# Patient Record
Sex: Male | Born: 1945 | Race: White | Hispanic: No | Smoking: Former smoker
Health system: Southern US, Community
[De-identification: ages and names within clinical notes are randomized; demographics above are authoritative.]

## PROBLEM LIST (undated history)

## (undated) DIAGNOSIS — I739 Peripheral vascular disease, unspecified: Secondary | ICD-10-CM

## (undated) DIAGNOSIS — I10 Essential (primary) hypertension: Secondary | ICD-10-CM

## (undated) DIAGNOSIS — E119 Type 2 diabetes mellitus without complications: Secondary | ICD-10-CM

## (undated) DIAGNOSIS — I499 Cardiac arrhythmia, unspecified: Secondary | ICD-10-CM

## (undated) HISTORY — PX: OTHER SURGICAL HISTORY: SHX169

## (undated) HISTORY — DX: Peripheral vascular disease, unspecified: I73.9

---

## 2001-07-12 ENCOUNTER — Encounter: Payer: Self-pay | Admitting: Family Medicine

## 2001-07-12 ENCOUNTER — Encounter: Admission: RE | Admit: 2001-07-12 | Discharge: 2001-07-12 | Payer: Self-pay | Admitting: Family Medicine

## 2003-10-21 ENCOUNTER — Encounter: Admission: RE | Admit: 2003-10-21 | Discharge: 2003-10-21 | Payer: Self-pay | Admitting: Family Medicine

## 2005-10-04 ENCOUNTER — Encounter: Admission: RE | Admit: 2005-10-04 | Discharge: 2005-10-04 | Payer: Self-pay | Admitting: Family Medicine

## 2006-03-20 ENCOUNTER — Emergency Department (HOSPITAL_COMMUNITY): Admission: EM | Admit: 2006-03-20 | Discharge: 2006-03-20 | Payer: Self-pay | Admitting: Emergency Medicine

## 2006-04-20 ENCOUNTER — Ambulatory Visit (HOSPITAL_BASED_OUTPATIENT_CLINIC_OR_DEPARTMENT_OTHER): Admission: RE | Admit: 2006-04-20 | Discharge: 2006-04-20 | Payer: Self-pay | Admitting: *Deleted

## 2008-12-23 ENCOUNTER — Encounter: Admission: RE | Admit: 2008-12-23 | Discharge: 2008-12-23 | Payer: Self-pay | Admitting: Family Medicine

## 2010-08-28 NOTE — Op Note (Signed)
NAME:  Randy Jacobson, Randy Jacobson NO.:  [id number]   MEDICAL RECORD NO.:  [id number]          PATIENT TYPE:  AMB   LOCATION:  DSC                          FACILITY:  [facility]   PHYSICIAN:  Tennis Must Meyerdierks, M.D.DATE OF BIRTH:  Aug 30, 1945   DATE OF PROCEDURE:  04/20/2006  DATE OF DISCHARGE:                               OPERATIVE REPORT   PREOPERATIVE DIAGNOSIS:  Necrosis, right long fingertip, status post  crush injury.   POSTOPERATIVE DIAGNOSIS:  Necrosis, right long fingertip, status post  crush injury.   PROCEDURE:  Debridement of nonviable tissue with excision of comminuted  portion of the distal phalanx, right long finger, and closure of wound.   SURGEON:  Dr. Metro Kung.   ANESTHESIA:  Half-percent Marcaine local in the minor room.   OPERATIVE FINDINGS:  The patient had a previous crush injury and  ___________ of the pulp became necrotic radially.  The distal phalanx  was severely comminuted.  The nail bed had a circular defect in the mid  portion.   PROCEDURE:  Under half-percent Marcaine local anesthesia, the right hand  was prepped and draped in the minor room, and the necrotic tissue was  debrided sharply.  The nail plate was removed, and the nail bed was  found to have a circular defect.  The flap was elevated after debriding  all nonviable tissue, and rongeur was used to remove the distal portion  of the distal phalanx that had been fractured.  After debriding all  nonviable tissue, the wound was copiously irrigated.  The nail bed was  repaired with 5-0 chromic and the skin with a 4-0 nylon.  Sterile  dressings were applied.  The patient tolerated the procedure well and  was discharged in good condition.      Lowell Bouton, M.D.  Electronically Signed     EMM/MEDQ  D:  04/20/2006  T:  04/20/2006  Job:  [id number]

## 2014-05-23 DIAGNOSIS — E1165 Type 2 diabetes mellitus with hyperglycemia: Secondary | ICD-10-CM | POA: Diagnosis not present

## 2014-05-23 DIAGNOSIS — I10 Essential (primary) hypertension: Secondary | ICD-10-CM | POA: Diagnosis not present

## 2014-10-24 DIAGNOSIS — E780 Pure hypercholesterolemia: Secondary | ICD-10-CM | POA: Diagnosis not present

## 2014-10-24 DIAGNOSIS — Z125 Encounter for screening for malignant neoplasm of prostate: Secondary | ICD-10-CM | POA: Diagnosis not present

## 2014-10-24 DIAGNOSIS — D559 Anemia due to enzyme disorder, unspecified: Secondary | ICD-10-CM | POA: Diagnosis not present

## 2014-10-24 DIAGNOSIS — E118 Type 2 diabetes mellitus with unspecified complications: Secondary | ICD-10-CM | POA: Diagnosis not present

## 2014-10-25 ENCOUNTER — Ambulatory Visit
Admission: RE | Admit: 2014-10-25 | Discharge: 2014-10-25 | Disposition: A | Discharge status: Home / Self Care | Payer: Medicare Other | Source: Ambulatory Visit | Attending: Internal Medicine | Admitting: Internal Medicine

## 2014-10-25 ENCOUNTER — Other Ambulatory Visit: Payer: Self-pay | Admitting: Internal Medicine

## 2014-10-25 DIAGNOSIS — M79661 Pain in right lower leg: Secondary | ICD-10-CM

## 2014-10-29 ENCOUNTER — Other Ambulatory Visit (HOSPITAL_COMMUNITY): Payer: Self-pay | Admitting: Internal Medicine

## 2014-10-29 DIAGNOSIS — R52 Pain, unspecified: Secondary | ICD-10-CM

## 2014-10-31 ENCOUNTER — Ambulatory Visit (HOSPITAL_COMMUNITY)
Admission: RE | Admit: 2014-10-31 | Discharge: 2014-10-31 | Disposition: A | Discharge status: Home / Self Care | Payer: Medicare Other | Source: Ambulatory Visit | Attending: Internal Medicine | Admitting: Internal Medicine

## 2014-10-31 ENCOUNTER — Other Ambulatory Visit (HOSPITAL_COMMUNITY): Payer: Self-pay | Admitting: Internal Medicine

## 2014-10-31 DIAGNOSIS — I70213 Atherosclerosis of native arteries of extremities with intermittent claudication, bilateral legs: Secondary | ICD-10-CM | POA: Diagnosis not present

## 2014-10-31 DIAGNOSIS — R52 Pain, unspecified: Secondary | ICD-10-CM | POA: Diagnosis not present

## 2014-10-31 DIAGNOSIS — I70211 Atherosclerosis of native arteries of extremities with intermittent claudication, right leg: Secondary | ICD-10-CM

## 2014-10-31 NOTE — Progress Notes (Signed)
*  PRELIMINARY RESULTS* Vascular Ultrasound Right Lower Extremity Arterial Duplex has been completed.   The right femoral artery in the mid to distal segment exhibits elevated velocities suggestive of 75-99% stenosis. The segment just proximal to the stenosis demonstrates a peak systolic velocity of 97cm/s, which makes the velocity ratio 7.78. This is also consistent with a 75-99% stenosis.  Preliminary results discussed with Dr. Chilton Si.  10/31/2014 12:08 PM Gertie Fey, RVT, RDCS, RDMS

## 2014-11-04 ENCOUNTER — Encounter (HOSPITAL_COMMUNITY): Payer: Self-pay

## 2014-11-04 ENCOUNTER — Ambulatory Visit (HOSPITAL_COMMUNITY)
Admission: RE | Admit: 2014-11-04 | Discharge: 2014-11-04 | Disposition: A | Discharge status: Home / Self Care | Payer: Medicare Other | Source: Ambulatory Visit | Attending: Internal Medicine | Admitting: Internal Medicine

## 2014-11-04 ENCOUNTER — Other Ambulatory Visit (HOSPITAL_COMMUNITY): Payer: Self-pay | Admitting: Internal Medicine

## 2014-11-04 DIAGNOSIS — I771 Stricture of artery: Secondary | ICD-10-CM | POA: Diagnosis not present

## 2014-11-04 DIAGNOSIS — I700 Atherosclerosis of aorta: Secondary | ICD-10-CM | POA: Insufficient documentation

## 2014-11-04 DIAGNOSIS — M5136 Other intervertebral disc degeneration, lumbar region: Secondary | ICD-10-CM | POA: Insufficient documentation

## 2014-11-04 DIAGNOSIS — M7121 Synovial cyst of popliteal space [Baker], right knee: Secondary | ICD-10-CM | POA: Diagnosis not present

## 2014-11-04 DIAGNOSIS — I2510 Atherosclerotic heart disease of native coronary artery without angina pectoris: Secondary | ICD-10-CM | POA: Insufficient documentation

## 2014-11-04 DIAGNOSIS — I739 Peripheral vascular disease, unspecified: Secondary | ICD-10-CM | POA: Diagnosis not present

## 2014-11-04 DIAGNOSIS — I70211 Atherosclerosis of native arteries of extremities with intermittent claudication, right leg: Secondary | ICD-10-CM

## 2014-11-04 DIAGNOSIS — K5730 Diverticulosis of large intestine without perforation or abscess without bleeding: Secondary | ICD-10-CM | POA: Insufficient documentation

## 2014-11-04 DIAGNOSIS — I70221 Atherosclerosis of native arteries of extremities with rest pain, right leg: Secondary | ICD-10-CM | POA: Insufficient documentation

## 2014-11-04 HISTORY — DX: Type 2 diabetes mellitus without complications: E11.9

## 2014-11-04 MED ORDER — IOHEXOL 350 MG/ML SOLN
100.0000 mL | Freq: Once | INTRAVENOUS | Status: AC | PRN
  Administered 2014-11-04: 100 mL via INTRAVENOUS

## 2014-11-07 DIAGNOSIS — I739 Peripheral vascular disease, unspecified: Secondary | ICD-10-CM | POA: Diagnosis not present

## 2014-11-08 ENCOUNTER — Other Ambulatory Visit: Payer: Self-pay | Admitting: Internal Medicine

## 2014-11-08 DIAGNOSIS — I739 Peripheral vascular disease, unspecified: Secondary | ICD-10-CM

## 2014-11-21 ENCOUNTER — Other Ambulatory Visit (HOSPITAL_COMMUNITY): Payer: Self-pay | Admitting: Interventional Radiology

## 2014-11-21 ENCOUNTER — Ambulatory Visit
Admission: RE | Admit: 2014-11-21 | Discharge: 2014-11-21 | Disposition: A | Discharge status: Home / Self Care | Payer: Medicare Other | Source: Ambulatory Visit | Attending: Interventional Radiology | Admitting: Interventional Radiology

## 2014-11-21 ENCOUNTER — Other Ambulatory Visit: Payer: Self-pay | Admitting: Internal Medicine

## 2014-11-21 ENCOUNTER — Ambulatory Visit
Admission: RE | Admit: 2014-11-21 | Discharge: 2014-11-21 | Disposition: A | Discharge status: Home / Self Care | Payer: Medicare Other | Source: Ambulatory Visit | Attending: Internal Medicine | Admitting: Internal Medicine

## 2014-11-21 DIAGNOSIS — I739 Peripheral vascular disease, unspecified: Secondary | ICD-10-CM

## 2014-11-21 DIAGNOSIS — I70212 Atherosclerosis of native arteries of extremities with intermittent claudication, left leg: Secondary | ICD-10-CM | POA: Diagnosis not present

## 2014-11-21 NOTE — Consult Note (Signed)
Chief Complaint: Patient was seen in consultation today for  Chief Complaint  Patient presents with  . Advice Only    PAD     at the request of Green,Edwin  Referring Physician(s): Green,Edwin  History of Present Illness: Randy Jacobson is a 69 y.o. male who presents today for symptoms of right lower extremity claudication. He is an extremely active gentleman often working from just after sunrise until sunset. 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  For the past 6-7 months he is experienced right calf claudication when he walks. This is most notable when he is attempting to mow the grass. He does note that the pain improves when he stops to rest. His calf pain is quite significant and is interfering with his ability to do what he wants to do on a daily basis.  He denies issues with wounds, ulcerations or rest pain in his right lower extremity. He denies symptoms of claudication in the left lower extremity. Currently he denies chest pain, shortness of breath, dyspnea on exertion or dyspnea while sleeping.   Past Medical History  Diagnosis Date  . Diabetes mellitus without complication     No past surgical history on file.  Allergies: Review of patient's allergies indicates no known allergies.  Medications: Prior to Admission medications   Not on File     No family history on file.  Social History   Social History  . Marital Status: Single    Spouse Name: N/A  . Number of Children: N/A  . Years of Education: N/A   Social History Main Topics  . Smoking status: Former Smoker -- 1.00 packs/day    Types: Cigarettes    Start date: 11/20/1957    Quit date: 11/20/1993  . Smokeless tobacco: Never Used  . Alcohol Use: 0.0 oz/week    0 Standard drinks or equivalent per week     Comment: Beer 12 pack/week     . Drug Use: Yes     Comment: Hx of marijuana use     . Sexual Activity: Not on file   Other Topics Concern  . Not on file   Social History Narrative    Review of Systems: A 12 point ROS discussed and pertinent positives are indicated in the HPI above.  All other systems are negative.  Review of Systems  Vital Signs: BP 179/83 mmHg  Pulse 71  Temp(Src) 97.6 F (36.4 C) (Oral)  Resp 14  Ht 5\' 11"  (1.803 m)  Wt 180 lb (81.647 kg)  BMI 25.12 kg/m2  SpO2 98%  Physical Exam  Constitutional: He is oriented to person, place, and time. He appears well-developed and well-nourished. No distress.  HENT:  Head: Normocephalic and atraumatic.  Eyes: No scleral icterus.  Cardiovascular: Normal rate, regular rhythm and normal heart sounds.   Pulses:      Femoral pulses are 2+ on the right side, and 3+ on the left side.      Popliteal pulses are 0 on the right side, and 2+ on the left side.       Dorsalis pedis pulses are 0 on the right side, and 2+ on the left side.       Posterior tibial pulses are 0 on the right side, and 0 on the left side.  No carotid or abdominal bruits detected.   Pulmonary/Chest: Effort normal and breath sounds normal.  Abdominal: Soft. He exhibits no distension and no mass.  There is no tenderness.  Neurological: He is alert and oriented to person, place, and time.  Skin: Skin is warm and dry.  Trophic nail changes.  No ulcers or skin breakdown.   Psychiatric: He has a normal mood and affect. His behavior is normal.  Nursing note and vitals reviewed.   Imaging: Dg Tibia/fibula Right  10/25/2014   CLINICAL DATA:  Midshaft pain for several weeks, no discrete episode of trauma, numbness of the skin over the fibular aspect of the leg.  EXAM: RIGHT TIBIA AND FIBULA - 2 VIEW  COMPARISON:  None.  FINDINGS: The right tibia and fibula are adequately mineralized. There is no lytic or blastic lesion or periosteal reaction. The observed portions of the knee and ankle exhibit minimal degenerative changes. The overlying soft tissues are unremarkable. There is some calcification of the interosseous membrane. No soft tissue gas  collections or foreign bodies are demonstrated.  IMPRESSION: There is no acute bony abnormality of the right tibia or fibula. No definite soft tissue abnormality is observed either.   Electronically Signed   By: David  [place] M.D.   On: 10/25/2014 12:08   Ct Angio Ao+bifem W/cm &/or Wo/cm  11/04/2014   CLINICAL DATA:  69 year old male with right lower extremity pain and suspected peripheral arterial disease  EXAM: CT ANGIOGRAPHY OF ABDOMINAL AORTA WITH ILIOFEMORAL RUNOFF  TECHNIQUE: Multidetector CT imaging of the abdomen, pelvis and lower extremities was performed using the standard protocol during bolus administration of intravenous contrast. Multiplanar CT image reconstructions and MIPs were obtained to evaluate the vascular anatomy.  CONTRAST:  OMNIPAQUE IOHEXOL 350 MG/ML SOLN  COMPARISON:  None.  FINDINGS: VASCULAR  Aorta: Normal caliber abdominal aorta. Peripheral calcified atherosclerotic plaque. No plaque ulceration, aneurysm or dissection.  Celiac: The left gastric artery and an accessory splenic artery share a common origin and arises directly from the aorta. The main celiac artery provides supply to the main splenic artery and common hepatic artery. No significant narrowing or evidence of visceral artery aneurysm.  SMA: Widely patent and unremarkable.  Renals: Single dominant renal arteries bilaterally are widely patent. No changes of fibromuscular dysplasia.  IMA: Perhaps mild narrowing at the origin.  Otherwise, unremarkable.  RIGHT Lower Extremity  Inflow: Focal high-grade stenosis of the proximal right common iliac artery secondary to calcified atherosclerotic plaque. Internal iliac artery is patent. The external iliac artery is relatively spared. Calcified atherosclerotic plaque present along the posterior aspect of the common femoral artery resulting in mild stenosis.  Outflow: Profunda femoral artery is widely patent. The proximal and mid superficial femoral artery are widely patent.  However, beginning in the mid thigh there is a focal mild to moderate stenosis secondary to heterogeneous atherosclerotic plaque. Distal to this, densely calcified atherosclerotic plaque results in at least 3 areas of moderate to high-grade stenosis. The popliteal artery is minimally diseased but otherwise widely patent.  Runoff: The anterior tibial and peroneal arteries are patent to the ankle. The posterior tibial artery occludes in the mid lower leg. The posterior tibial artery reconstitutes via collateral flow from the peroneal.  LEFT lower Extremity  Inflow: Calcified atherosclerotic plaque in the distal common iliac artery results in at least moderate focal stenosis at the bifurcation. The internal iliac artery remains patent. The common iliac artery is a disease but there is no focal stenosis. Mild calcified plaque in the common femoral artery results in perhaps mild narrowing.  Outflow: Profunda femoral artery is widely patent. The superficial femoral artery is diffusely diseased  with several areas of mild to moderate stenosis beginning in the mid thigh. Disease is most significant in the distal third of the thigh. Calcified plaque results in at least moderate narrowing of the above the knee popliteal artery. The remainder the popliteal artery is relatively spared from disease.  Runoff: Calcified plaque noted in the proximal runoff vessels. The posterior tibial artery occludes in the mid calf. The anterior tibial and peroneal arteries remain patent to the ankle.  Veins: No focal venous abnormality.  Review of the MIP images confirms the above findings.  NON-VASCULAR  Lower Chest: Atherosclerotic calcifications noted along the course of the visualized coronary arteries. The heart is within normal limits for size it no pericardial effusion. The lungs are clear. Unremarkable visualized distal thoracic esophagus.  Abdomen: Unremarkable CT appearance of the stomach, duodenum, spleen, adrenal glands and pancreas.  Normal hepatic contour morphology. No discrete hepatic lesion. Gallbladder is unremarkable. No intra or extrahepatic biliary ductal dilatation.  Unremarkable appearance of the bilateral kidneys. No focal solid lesion, hydronephrosis or nephrolithiasis. 2.7 cm low-attenuation cystic lesion in the medial aspect of the interpolar left kidney consistent with a simple cyst.  Colonic diverticular disease without CT evidence of active inflammation. No evidence of obstruction or focal bowel wall thickening. Normal appendix in the right lower quadrant. The terminal ileum is unremarkable. No free fluid or suspicious adenopathy.  Pelvis: Mild prostatomegaly. Unremarkable appearance of the bladder. No suspicious adenopathy or free fluid.  Bones/Soft Tissues: No acute fracture or aggressive appearing lytic or blastic osseous lesion. L5-S1 degenerative disc disease and facet arthropathy. Fluid collection in the medial aspect of the right popliteal fossa consistent with a Baker's cyst.  IMPRESSION: VASCULAR  1. Multilevel right lower extremity peripheral arterial disease including inflow, outflow and runoff disease as detailed above. Of note, there is a significant focal high-grade stenosis of the proximal common iliac artery, multifocal moderate to advanced disease in the mid and distal superficial femoral artery and chronic occlusion of the posterior tibial artery. 2. Multilevel left lower extremity peripheral arterial disease including inflow, outflow and runoff disease as detailed above. Of note, there is a focal moderate stenosis of the distal common iliac artery just proximal to the bifurcation, multifocal mild to moderate disease in the mid and distal superficial femoral artery and chronic occlusion of the posterior tibial artery. 3. 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 4. No evidence of aneurysm, dissection. 5. Multifocal coronary artery calcification.  Please note that although the presence of coronary artery calcium documents the presence of coronary artery disease, the severity of this disease and any potential stenosis cannot be assessed on this non-gated CT examination. Assessment for potential risk factor modification, dietary therapy or pharmacologic therapy may be warranted, if clinically indicated.  NON VASCULAR  1. Colonic diverticular disease without CT evidence of active inflammation. 2. Lower lumbar degenerative disc disease and facet arthropathy. 3. Right-sided Baker's cyst.  Signed,  Sterling Big, MD  [facility]  [facility]   Electronically Signed   By: Malachy Moan M.D.   On: 11/04/2014 16:26   [place] Low Ext Art Bil W/exercise  11/21/2014   CLINICAL DATA:  69 year old male with right lower extremity claudication  EXAM: NONINVASIVE PHYSIOLOGIC VASCULAR STUDY OF BILATERAL LOWER EXTREMITIES WITH AND WITHOUT EXERCISE  TECHNIQUE: Evaluation of both lower extremities were performed at rest, including calculation of ankle-brachial indices, multiple segmental pressure evaluation, segmental Doppler and  segmental pulse volume recording. Ankle brachial indices were also obtained following treadmill exercise.  COMPARISON:  CTA with runoff 11/04/2014  FINDINGS: RESTING  Right ABI: 0.7  Left ABI: 1.1  Right lower extremity: Abnormal segmental pressures. There is a significant pressure drop between the brachial and high thigh cuff indicating a hemodynamically significant inflow (iliac) lesion. Additionally, there is a significant pressure drop-off between the high thigh and low thigh cuff consistent with superficial femoral disease. Pulse volume recordings demonstrate blunting of the waveforms in the low thigh, calf, ankle and digits.  Left lower extremity: Relatively normal segmental pressures and PVRs to the level of the calf.  POST EXERCISE  Right ABI: 0.1  Left ABI: 1.0  IMPRESSION: 1. Moderate  peripheral arterial disease in the right lower extremity at rest with an ankle brachial index is 0.7. Severe underlying peripheral arterial disease revealed following exercise. 2. Noninvasive ultrasound study confirms significant lesions in the inflow (iliac artery) and outflow (superficial femoral artery) territories. 3. Normal resting and post exercise ankle brachial index on the left. Signed,  Sterling Big, MD  [facility]  [facility]   Electronically Signed   By: Malachy Moan M.D.   On: 11/21/2014 11:41    Labs:  CBC: No results for input(s): WBC, HGB, HCT, PLT in the last 8760 hours.  COAGS: No results for input(s): INR, APTT in the last 8760 hours.  BMP: No results for input(s): NA, K, CL, CO2, GLUCOSE, BUN, CALCIUM, CREATININE, GFRNONAA, GFRAA in the last 8760 hours.  Invalid input(s): CMP  LIVER FUNCTION TESTS: No results for input(s): BILITOT, AST, ALT, ALKPHOS, PROT, ALBUMIN in the last 8760 hours.  TUMOR MARKERS: No results for input(s): AFPTM, CEA, CA199, CHROMGRNA in the last 8760 hours.  Assessment and Plan:  Mr. Sorbello is a very pleasant and active 69 year old gentleman with severe right lower extremity peripheral arterial disease and lifestyle limiting claudication (Rutherford class 2/3).   His resting right ankle brachial index is 0.7 consistent with a moderate peripheral arterial disease, however after 2 minutes of exercise his ABI drops to 0.1 consistent with severe disease.   We discussed a trial of conservative therapy with cilostazol, however his symptoms are significantly affecting his activities of daily living. He runs a Metallurgist and farm and needs to be able to walk without limitations. He strongly desires pursuing endovascular intervention.  1.) Schedule for pelvic and right lower extremity arteriogram as well as placement of kissing iliac stents at [facility] for treatment of his  high-grade right common iliac artery stenosis.  He prefers the procedure be done on a Thursday.  2.) I will plan to see him in follow-up in clinic and reassess his symptoms. If his claudication symptoms persist we will then schedule him for management of his femoral popliteal disease. He has multifocal stenoses in the distal SFA secondary to bulky calcified plaque. Optimal treatment in this setting would include either orbital atherectomy followed by drug-eluting balloon angioplasty or management with a Supera stent which shows optimal results in the setting of bulky calcified plaque.  Thank you for this interesting consult.  I greatly enjoyed meeting Randy Jacobson and look forward to participating in their care.  A copy of this report was sent to the requesting provider on this date.  SignedMalachy Moan 11/21/2014, 10:59 AM   I spent a total of  30 Minutes in face to face in clinical consultation, greater than 50% of which was counseling/coordinating  care for claudication and peripheral arterial disease.

## 2014-12-02 ENCOUNTER — Other Ambulatory Visit (HOSPITAL_COMMUNITY): Payer: Self-pay | Admitting: Interventional Radiology

## 2014-12-19 ENCOUNTER — Other Ambulatory Visit (HOSPITAL_COMMUNITY): Payer: Self-pay | Admitting: Interventional Radiology

## 2014-12-19 ENCOUNTER — Telehealth (HOSPITAL_COMMUNITY): Payer: Self-pay | Admitting: Interventional Radiology

## 2014-12-19 DIAGNOSIS — I771 Stricture of artery: Secondary | ICD-10-CM

## 2014-12-19 NOTE — Telephone Encounter (Signed)
Called pt to schedule his procedure with Dr. Archer Asa. Patient asked for me to call him back in 1 hour to schedule this. I will call him back at 10 am today JM

## 2014-12-24 ENCOUNTER — Other Ambulatory Visit: Payer: Self-pay | Admitting: Radiology

## 2014-12-26 ENCOUNTER — Other Ambulatory Visit: Payer: Self-pay | Admitting: Radiology

## 2014-12-27 ENCOUNTER — Ambulatory Visit (HOSPITAL_COMMUNITY)
Admission: RE | Admit: 2014-12-27 | Discharge: 2014-12-27 | Disposition: A | Discharge status: Home / Self Care | Payer: Medicare Other | Source: Ambulatory Visit | Attending: Interventional Radiology | Admitting: Interventional Radiology

## 2014-12-27 ENCOUNTER — Encounter (HOSPITAL_COMMUNITY): Payer: Self-pay

## 2014-12-27 ENCOUNTER — Other Ambulatory Visit (HOSPITAL_COMMUNITY): Payer: Self-pay | Admitting: Interventional Radiology

## 2014-12-27 DIAGNOSIS — R001 Bradycardia, unspecified: Secondary | ICD-10-CM | POA: Insufficient documentation

## 2014-12-27 DIAGNOSIS — I70219 Atherosclerosis of native arteries of extremities with intermittent claudication, unspecified extremity: Secondary | ICD-10-CM | POA: Diagnosis present

## 2014-12-27 DIAGNOSIS — Z7982 Long term (current) use of aspirin: Secondary | ICD-10-CM | POA: Insufficient documentation

## 2014-12-27 DIAGNOSIS — Z79899 Other long term (current) drug therapy: Secondary | ICD-10-CM | POA: Insufficient documentation

## 2014-12-27 DIAGNOSIS — I70211 Atherosclerosis of native arteries of extremities with intermittent claudication, right leg: Secondary | ICD-10-CM | POA: Diagnosis not present

## 2014-12-27 DIAGNOSIS — I70213 Atherosclerosis of native arteries of extremities with intermittent claudication, bilateral legs: Secondary | ICD-10-CM | POA: Insufficient documentation

## 2014-12-27 DIAGNOSIS — I771 Stricture of artery: Secondary | ICD-10-CM

## 2014-12-27 DIAGNOSIS — E119 Type 2 diabetes mellitus without complications: Secondary | ICD-10-CM | POA: Insufficient documentation

## 2014-12-27 DIAGNOSIS — Z87891 Personal history of nicotine dependence: Secondary | ICD-10-CM | POA: Insufficient documentation

## 2014-12-27 DIAGNOSIS — I959 Hypotension, unspecified: Secondary | ICD-10-CM | POA: Insufficient documentation

## 2014-12-27 DIAGNOSIS — E785 Hyperlipidemia, unspecified: Secondary | ICD-10-CM | POA: Diagnosis not present

## 2014-12-27 LAB — CBC
HCT: 35.7 % — ABNORMAL LOW (ref 39.0–52.0)
Hemoglobin: 12.4 g/dL — ABNORMAL LOW (ref 13.0–17.0)
MCH: 32.5 pg (ref 26.0–34.0)
MCHC: 34.7 g/dL (ref 30.0–36.0)
MCV: 93.7 fL (ref 78.0–100.0)
PLATELETS: 156 10*3/uL (ref 150–400)
RBC: 3.81 MIL/uL — ABNORMAL LOW (ref 4.22–5.81)
RDW: 13.6 % (ref 11.5–15.5)
WBC: 4.7 10*3/uL (ref 4.0–10.5)

## 2014-12-27 LAB — BASIC METABOLIC PANEL
Anion gap: 7 (ref 5–15)
BUN: 15 mg/dL (ref 6–20)
CALCIUM: 9 mg/dL (ref 8.9–10.3)
CO2: 30 mmol/L (ref 22–32)
CREATININE: 0.71 mg/dL (ref 0.61–1.24)
Chloride: 98 mmol/L — ABNORMAL LOW (ref 101–111)
GFR calc Af Amer: >60 mL/min (ref >60)
Glucose, Bld: 171 mg/dL — ABNORMAL HIGH (ref 65–99)
Potassium: 4.8 mmol/L (ref 3.5–5.1)
SODIUM: 135 mmol/L (ref 135–145)

## 2014-12-27 LAB — GLUCOSE, CAPILLARY
GLUCOSE-CAPILLARY: 184 mg/dL — AB (ref 65–99)
Glucose-Capillary: 154 mg/dL — ABNORMAL HIGH (ref 65–99)

## 2014-12-27 LAB — PROTIME-INR
INR: 1.1 (ref 0.00–1.49)
PROTHROMBIN TIME: 14.4 s (ref 11.6–15.2)

## 2014-12-27 LAB — APTT: APTT: 30 s (ref 24–37)

## 2014-12-27 MED ORDER — CLOPIDOGREL BISULFATE 300 MG PO TABS
ORAL_TABLET | ORAL | Status: DC
Start: 2014-12-27 — End: 2014-12-28
  Filled 2014-12-27: qty 1

## 2014-12-27 MED ORDER — HEPARIN SODIUM (PORCINE) 1000 UNIT/ML IJ SOLN
5000.0000 [IU] | Freq: Once | INTRAMUSCULAR | Status: AC
  Administered 2014-12-27: 5000 [IU] via INTRAVENOUS

## 2014-12-27 MED ORDER — SODIUM CHLORIDE 0.9 % IV SOLN
Freq: Once | INTRAVENOUS | Status: AC
  Administered 2014-12-27: 11:00:00 via INTRAVENOUS

## 2014-12-27 MED ORDER — IOHEXOL 300 MG/ML  SOLN
150.0000 mL | Freq: Once | INTRAMUSCULAR | Status: DC | PRN
  Administered 2014-12-27: 80 mL via INTRAVENOUS
  Filled 2014-12-27: qty 150

## 2014-12-27 MED ORDER — MIDAZOLAM HCL 2 MG/2ML IJ SOLN
INTRAMUSCULAR | Status: AC
  Filled 2014-12-27: qty 4

## 2014-12-27 MED ORDER — CLOPIDOGREL BISULFATE 75 MG PO TABS
300.0000 mg | ORAL_TABLET | Freq: Every day | ORAL | Status: DC
  Administered 2014-12-27: 300 mg via ORAL

## 2014-12-27 MED ORDER — SODIUM CHLORIDE 0.9 % IV SOLN
INTRAVENOUS | Status: AC
  Administered 2014-12-27: 11:00:00 via INTRAVENOUS

## 2014-12-27 MED ORDER — CLOPIDOGREL BISULFATE 75 MG PO TABS: 75.0000 mg | ORAL_TABLET | Freq: Every day | ORAL | Status: DC

## 2014-12-27 MED ORDER — HEPARIN SODIUM (PORCINE) 1000 UNIT/ML IJ SOLN
INTRAMUSCULAR | Status: AC
  Filled 2014-12-27: qty 1

## 2014-12-27 MED ORDER — MIDAZOLAM HCL 2 MG/2ML IJ SOLN
INTRAMUSCULAR | Status: AC | PRN
Start: 2014-12-27 — End: 2014-12-27
  Administered 2014-12-27 (×2): 1 mg via INTRAVENOUS

## 2014-12-27 MED ORDER — SODIUM CHLORIDE 0.9 % IV SOLN: Freq: Once | INTRAVENOUS | Status: DC

## 2014-12-27 MED ORDER — FENTANYL CITRATE (PF) 100 MCG/2ML IJ SOLN
INTRAMUSCULAR | Status: AC | PRN
  Administered 2014-12-27: 25 ug via INTRAVENOUS
  Administered 2014-12-27: 50 ug via INTRAVENOUS
  Administered 2014-12-27: 25 ug via INTRAVENOUS

## 2014-12-27 MED ORDER — LIDOCAINE HCL 1 % IJ SOLN
INTRAMUSCULAR | Status: AC
  Filled 2014-12-27: qty 20

## 2014-12-27 MED ORDER — SODIUM CHLORIDE 0.9 % IV SOLN: INTRAVENOUS | Status: AC

## 2014-12-27 MED ORDER — FENTANYL CITRATE (PF) 100 MCG/2ML IJ SOLN
INTRAMUSCULAR | Status: AC
  Filled 2014-12-27: qty 4

## 2014-12-27 MED ORDER — HYDROCODONE-ACETAMINOPHEN 5-325 MG PO TABS: 1.0000 | ORAL_TABLET | ORAL | Status: DC | PRN

## 2014-12-27 MED FILL — Medication: Qty: 1 | Status: AC

## 2014-12-27 NOTE — Procedures (Signed)
Interventional Radiology Procedure Note  Procedure: Kissing iliac stents  Access: Bilateral common femoral arteries, 69F sheaths  Complications: None  Estimated Blood Loss: 0  Recommendations:   -Bedrest x 4 hrs - Load with Plavix - DC laetr today - FU in clinic in 4 weeks with repeat ABI  Signed,  Sterling Big, MD

## 2014-12-27 NOTE — Sedation Documentation (Signed)
Pt alert and oriented, states he is feeling much better at this time. Per Dr Archer Asa 500cc NS bolus infusing now, NS @ 150/h until discharge. Bedrest for 3 hours until 3pm, discharge planned at 4pm.

## 2014-12-27 NOTE — Sedation Documentation (Signed)
Patient is resting comfortably. 

## 2014-12-27 NOTE — Sedation Documentation (Signed)
Pt transferred to nurses station. Vitals stable, no complaints of pain. Will continue to monitor.

## 2014-12-27 NOTE — Sedation Documentation (Signed)
Pt denies pain, MD in room, rapid response at bedside

## 2014-12-27 NOTE — H&P (Signed)
History of Present Illness: Randy Jacobson is a 69 y.o. male with severe right lower extremity arterial disease with lifestyle limiting claudication. He complains of RLE calf pain within 1 hour of activity and pain resolves completely with rest. He denies any wounds or ulcers. He denies any active bleeding or upcoming surgeries. He has had an abnormal ABI, CTA runoff 11/04/14 with evidence of peripheral arterial disease including high grade stenosis of right common iliac artery. He has been seen in full consult on 11/21/14 with Dr. Archer Asa and scheduled today for pelvic and right lower extremity arteriogram with possible angioplasty/stenting. He denies any chest pain, shortness of breath or palpitations. He denies any active signs of bleeding or excessive bruising. He denies any recent fever or chills. The patient denies any history of sleep apnea or chronic oxygen use. He has previously tolerated sedation without complications.    Past Medical History  Diagnosis Date  . Diabetes mellitus without complication   Hyperlipidemia   History reviewed. No pertinent past surgical history.  Allergies: Review of patient's allergies indicates no known allergies.  Medications: Prior to Admission medications   Medication Sig Start Date End Date Taking? Authorizing Provider  aspirin EC 81 MG tablet Take 81 mg by mouth daily.   Yes Historical Provider, MD  furosemide (LASIX) 40 MG tablet Take 40 mg by mouth daily.   Yes Historical Provider, MD  hydrochlorothiazide (HYDRODIURIL) 25 MG tablet Take 25 mg by mouth daily.   Yes Historical Provider, MD  KRILL OIL PO Take 1 capsule by mouth daily.   Yes Historical Provider, MD  losartan (COZAAR) 100 MG tablet Take 100 mg by mouth daily.   Yes Historical Provider, MD  metFORMIN (GLUCOPHAGE) 1000 MG tablet Take 1,000 mg by mouth 2 (two) times daily with a meal.   Yes Historical Provider, MD  Multiple Vitamin (MULTIVITAMIN WITH MINERALS) TABS tablet Take 1  tablet by mouth daily.   Yes Historical Provider, MD  simvastatin (ZOCOR) 40 MG tablet Take 40 mg by mouth daily.   Yes Historical Provider, MD     History reviewed. No pertinent family history.  Social History   Social History  . Marital Status: Single    Spouse Name: N/A  . Number of Children: N/A  . Years of Education: N/A   Social History Main Topics  . Smoking status: Former Smoker -- 1.00 packs/day    Types: Cigarettes    Start date: 11/20/1957    Quit date: 11/20/1993  . Smokeless tobacco: Never Used  . Alcohol Use: 0.0 oz/week    0 Standard drinks or equivalent per week     Comment: Beer 12 pack/week     . Drug Use: Yes     Comment: Hx of marijuana use     . Sexual Activity: Not Asked   Other Topics Concern  . None   Social History Narrative    Review of Systems: A 12 point ROS discussed and pertinent positives are indicated in the HPI above.  All other systems are negative.  Review of Systems  Vital Signs: BP 151/78 mmHg  Pulse 73  Temp(Src) 97.6 F (36.4 C) (Oral)  Resp 16  Ht 5\' 11"  (1.803 m)  Wt 179 lb (81.194 kg)  BMI 24.98 kg/m2  SpO2 100%  Physical Exam  Constitutional: He is oriented to person, place, and time. No distress.  HENT:  Head: Normocephalic and atraumatic.  Cardiovascular: Normal rate and regular rhythm.  Exam reveals no gallop and  no friction rub.   No murmur heard. Left DP 1-2+, Right DP/PT unable to be palpated, loss of hair growth on right shin, warm to touch b/l, no wounds or ulcers seen  Pulmonary/Chest: Effort normal and breath sounds normal. No respiratory distress. He has no wheezes. He has no rales.  Abdominal: Soft. Bowel sounds are normal. He exhibits no distension. There is no tenderness.  Neurological: He is alert and oriented to person, place, and time.  Skin: Skin is warm and dry. He is not diaphoretic.    Mallampati Score:  MD Evaluation Airway: WNL Heart: WNL Abdomen: WNL Chest/ Lungs: WNL ASA   Classification: 2 Mallampati/Airway Score: Two  Imaging: No results found.  Labs:  CBC:  Recent Labs  12/27/14 0730  WBC 4.7  HGB 12.4*  HCT 35.7*  PLT 156    COAGS:  Recent Labs  12/27/14 0730  INR 1.10  APTT 30    BMP:  Recent Labs  12/27/14 0730  NA 135  K 4.8  CL 98*  CO2 30  GLUCOSE 171*  BUN 15  CALCIUM 9.0  CREATININE 0.71  GFRNONAA >60  GFRAA >60    Assessment and Plan: Severe right lower extremity arterial disease with lifestyle limiting claudication Abnormal ABI CTA runoff 11/04/14 with evidence of peripheral arterial disease including high grade stenosis of right common iliac artery Seen in full consult on 11/21/14 with Dr. Archer Asa Scheduled today for pelvic and right lower extremity arteriogram with possible angioplasty/stenting with sedation The patient has been NPO, no blood thinners taken, labs and vitals have been reviewed. Risks and Benefits discussed with the patient including, but not limited to bleeding, infection, vascular injury or contrast induced renal failure. All of the patient's questions were answered, patient is agreeable to proceed. Consent signed and in chart. DM on metformin  HLP on statin   Signed: Berneta Levins 12/27/2014, 8:12 AM

## 2014-12-27 NOTE — Sedation Documentation (Signed)
Vital signs stable. 

## 2014-12-27 NOTE — Discharge Instructions (Signed)
Start plavix 75 mg daily Hold metformin 48 hrs after   Angiogram, Care After Refer to this sheet in the next few weeks. These instructions provide you with information on caring for yourself after your procedure. Your health care provider may also give you more specific instructions. Your treatment has been planned according to current medical practices, but problems sometimes occur. Call your health care provider if you have any problems or questions after your procedure.  WHAT TO EXPECT AFTER THE PROCEDURE After your procedure, it is typical to have the following sensations:  Minor discomfort or tenderness and a small bump at the catheter insertion site. The bump should usually decrease in size and tenderness within 1 to 2 weeks.  Any bruising will usually fade within 2 to 4 weeks. HOME CARE INSTRUCTIONS   You may need to keep taking blood thinners if they were prescribed for you. Take medicines only as directed by your health care provider.  Do not apply powder or lotion to the site.  Do not take baths, swim, or use a hot tub until your health care provider approves.  You may shower 24 hours after the procedure. Remove the bandage (dressing) and gently wash the site with plain soap and water. Gently pat the site dry.  Inspect the site at least twice daily.  Limit your activity for the first 48 hours. Do not bend, squat, or lift anything over 20 lb (9 kg) or as directed by your health care provider.  Plan to have someone take you home after the procedure. Follow instructions about when you can drive or return to work. SEEK MEDICAL CARE IF:  You get light-headed when standing up.  You have drainage (other than a small amount of blood on the dressing).  You have chills.  You have a fever.  You have redness, warmth, swelling, or pain at the insertion site. SEEK IMMEDIATE MEDICAL CARE IF:   You develop chest pain or shortness of breath, feel faint, or pass out.  You have  bleeding, swelling larger than a walnut, or drainage from the catheter insertion site.  You develop pain, discoloration, coldness, or severe bruising in the leg or arm that held the catheter.  You develop bleeding from any other place, such as the bowels. You may see bright red blood in your urine or stools, or your stools may appear black and tarry.  You have heavy bleeding from the site. If this happens, hold pressure on the site. CALL 911 MAKE SURE YOU:  Understand these instructions.  Will watch your condition.  Will get help right away if you are not doing well or get worse. Document Released: 10/15/2004 Document Revised: 08/13/2013 Document Reviewed: 08/21/2012 [facility] Patient Information 2015 [place], [place]. This information is not intended to replace advice given to you by your health care provider. Make sure you discuss any questions you have with your health care provider.

## 2014-12-27 NOTE — Sedation Documentation (Signed)
Report called to SS, [facility] vitals stable, left and right femoral sites WNL. Pt denies pain or light headedness.

## 2014-12-27 NOTE — Progress Notes (Signed)
RT responded to Rapid Response page. Upon assessment pt on 5L, responsive, and Spo2 100%. Rapid Response team and MD at bedside.

## 2014-12-27 NOTE — Sedation Documentation (Signed)
Patient is resting comfortably. Vital signs stable. No complaints of pain or feeling light headed

## 2014-12-27 NOTE — Sedation Documentation (Signed)
Pt alert and oriented, MD at bedside holding pressure. Pt states he is feeling well, vital signs stable

## 2014-12-27 NOTE — Sedation Documentation (Signed)
Pt states he feels light headed no pain

## 2014-12-27 NOTE — Sedation Documentation (Signed)
Pt alert and oriented.

## 2014-12-27 NOTE — Sedation Documentation (Signed)
Patient is resting comfortably. Vital signs stable, no complaints of pain or light headedness. Will continue to monitor

## 2014-12-27 NOTE — Progress Notes (Signed)
Just before moving the patient from the procedure table to the stretcher, Mr. Sigmon became light headed, nauseated and diaphoretic.  Vitals were obtained and the pt was found to be hypotensive and bradycardic.   Pt placed in reverse trendelenburg, fluid bolus initiated.  Concerned for a possible vessel rupture with acute bleeding, the pt was emergently prepped and draped.  An omniflush catheter was advanced into the aorta and angiography was performed.   There is no evidence of vascular complication.  No bleeding, dissection or other issue.  There is minimal left groin hematoma.  Pt denies abdominal or pelvic pain, chest pain or shortness of breath.   He responded quickly to conservative therapy with normalization of blood pressure, bradycardia and resolution of diaphoresis.  This appears to have been a profound vasovagal episode.  Signed,  Sterling Big, MD  Pager: [phone] Clinic: [phone]

## 2014-12-27 NOTE — Sedation Documentation (Signed)
Vital signs stable. Pt resting comfortably, sleeping at this time. Wife at bedside. Left and right femoral sights WNL, no drainage.

## 2014-12-27 NOTE — Sedation Documentation (Signed)
Per dr Archer Asa, pt to stay in nurses station for one hour then transfer to short stay

## 2014-12-31 ENCOUNTER — Other Ambulatory Visit (HOSPITAL_COMMUNITY): Payer: Self-pay | Admitting: Interventional Radiology

## 2014-12-31 ENCOUNTER — Telehealth: Payer: Self-pay | Admitting: Radiology

## 2014-12-31 DIAGNOSIS — I739 Peripheral vascular disease, unspecified: Secondary | ICD-10-CM

## 2014-12-31 NOTE — Telephone Encounter (Signed)
Per Dr Archer Asa:   Patient may return to work on 01/01/2015 without restrictions.   For 4-6 wk follow up.  Will call back to schedule.  Henri Fairland, RN 12/31/2014 2:30 PM

## 2015-01-07 ENCOUNTER — Other Ambulatory Visit (HOSPITAL_COMMUNITY): Payer: Self-pay | Admitting: Interventional Radiology

## 2015-01-07 DIAGNOSIS — I739 Peripheral vascular disease, unspecified: Secondary | ICD-10-CM

## 2015-01-16 DIAGNOSIS — Z23 Encounter for immunization: Secondary | ICD-10-CM | POA: Diagnosis not present

## 2015-02-18 ENCOUNTER — Ambulatory Visit
Admission: RE | Admit: 2015-02-18 | Discharge: 2015-02-18 | Disposition: A | Discharge status: Home / Self Care | Payer: Medicare Other | Source: Ambulatory Visit | Attending: Interventional Radiology | Admitting: Interventional Radiology

## 2015-02-18 ENCOUNTER — Other Ambulatory Visit (HOSPITAL_COMMUNITY): Payer: Self-pay | Admitting: Interventional Radiology

## 2015-02-18 DIAGNOSIS — I739 Peripheral vascular disease, unspecified: Secondary | ICD-10-CM | POA: Diagnosis not present

## 2015-02-18 DIAGNOSIS — I70211 Atherosclerosis of native arteries of extremities with intermittent claudication, right leg: Secondary | ICD-10-CM | POA: Diagnosis not present

## 2015-02-18 NOTE — Progress Notes (Signed)
Patient ID: Randy Jacobson, male   DOB: 1946/03/16, 69 y.o.   MRN: [id number]   Referring Physician(s): Nila Nephew  Chief Complaint: The patient is seen in follow up today s/p placement of bilateral kissing common iliac artery stents on 12/27/14  History of present illness: Randy Jacobson is a 69 year-old white male who was previously referred to the interventional radiology service by Dr. 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 He was seen by Dr. Archer Asa in consultation on 11/21/14 and deemed an appropriate candidate for follow-up lower extremity arteriography with possible intervention. On 12/27/14 he underwent successful placement of bilateral kissing common iliac artery stents. The procedure was performed without immediate complications. He presents today for routine follow-up exam. According to the patient his symptoms have improved tremendously since preprocedure. Initially he rated his right lower extremity pain as an 8 out of 10 with exercise. At the time he also experienced paresthesias as well as a throbbing sensation in the right lower extremity with activity. He denied any acute complaints with the left lower extremity. Post bilateral  iliac artery stenting the patient states that there is no significant right lower extremity pain with ambulation or at rest. There is also no significant paresthesias of either extremity. He does however have some mild achiness of the lower extremities after a long day on his feet but this has improved significantly since the above procedure. He is a nonsmoker and is currently taking baby aspirin daily. He has no additional acute complaints at this time. Follow-up ABI today is 0.97 in both lower extremities, previously 0.7 at rest in right lower extremity with drop to 0.1 with exercise in right lower extremity prior to stenting.   Past Medical History  Diagnosis Date    . Diabetes mellitus without complication     No past surgical history on file.  Allergies: Review of patient's allergies indicates no known allergies.  Medications: Prior to Admission medications   Medication Sig Start Date End Date Taking? Authorizing Provider  aspirin EC 81 MG tablet Take 81 mg by mouth daily.   Yes Historical Provider, MD  furosemide (LASIX) 40 MG tablet Take 40 mg by mouth daily.   Yes Historical Provider, MD  hydrochlorothiazide (HYDRODIURIL) 25 MG tablet Take 25 mg by mouth daily.   Yes Historical Provider, MD  KRILL OIL PO Take 1 capsule by mouth daily.   Yes Historical Provider, MD  losartan (COZAAR) 100 MG tablet Take 100 mg by mouth daily.   Yes Historical Provider, MD  metFORMIN (GLUCOPHAGE) 1000 MG tablet Take 1,000 mg by mouth 2 (two) times daily with a meal.   Yes Historical Provider, MD  Multiple Vitamin (MULTIVITAMIN WITH MINERALS) TABS tablet Take 1 tablet by mouth daily.   Yes Historical Provider, MD  simvastatin (ZOCOR) 40 MG tablet Take 40 mg by mouth daily.   Yes Historical Provider, MD     No family history on file.  Social History   Social History  . Marital Status: Single    Spouse Name: N/A  . Number of Children: N/A  . Years of Education: N/A   Social History Main Topics  . Smoking status: Former Smoker -- 1.00 packs/day    Types: Cigarettes    Start date: 11/20/1957    Quit date: 11/20/1993  . Smokeless tobacco: Never Used  . Alcohol Use: 0.0 oz/week    0 Standard drinks or equivalent per week  Comment: Beer 12 pack/week     . Drug Use: Yes     Comment: Hx of marijuana use     . Sexual Activity: Not on file   Other Topics Concern  . Not on file   Social History Narrative     Vital Signs: BP 182/70 mmHg  Pulse 71  Temp(Src) 97.7 F (36.5 C) (Oral)  Resp 14  Ht 5\' 11"  (1.803 m)  Wt 180 lb (81.647 kg)  BMI 25.12 kg/m2  SpO2 100%  Physical Exam patient is awake, alert. Chest is clear to auscultation  bilaterally. Heart with regular rate and rhythm. Puncture sites bilateral common femoral arteries clean, dry, nontender, no hematoma, no ecchymosis. Extremities full range of motion, no edema, distal pulses faint to 1+ on right ,1-2+ on left.  Imaging: [place] Low Ext Art Bil W/exercise  02/18/2015  CLINICAL DATA:  69 year old male with right lower extremity claudication status post placement of bilateral kissing iliac stents. First postoperative evaluation. EXAM: NONINVASIVE PHYSIOLOGIC VASCULAR STUDY OF BILATERAL LOWER EXTREMITIES WITH AND WITHOUT EXERCISE TECHNIQUE: Evaluation of both lower extremities were performed at rest, including calculation of ankle-brachial indices, multiple segmental pressure evaluation, segmental Doppler and segmental pulse volume recording. Ankle brachial indices were also obtained following treadmill exercise. COMPARISON:  Prior noninvasive vascular study 11/21/2014 FINDINGS: RESTING Right ABI: 1.0 (previously 0.7) Left ABI: 1.1 Right lower extremity: Normal triphasic waveforms in the femoral and popliteal arteries. The dorsalis pedis waveform is also triphasic. The posterior tibial waveform is monophasic consistent with an element of small vessel disease. Otherwise, no significant pressure differential on segmental pressures. Decreased pulse volume recordings at the ankle and digits consistent with small vessels disease. Left lower extremity: Normal triphasic waveforms in the femoral, popliteal and dorsalis pedis arteries. The posterior tibial artery is monophasic. Normal segmental pressures and PVRs to the level of the calf. The ankle and digital waveforms are attenuated. POST EXERCISE Right ABI: 1.0 (previously 0.1) Left ABI: 1.0 IMPRESSION: 1. Interval normalization of the resting and post exercise right ankle brachial indices. This represents a significant improvement compared to moderate and severe disease as demonstrated on the prior study from 11/21/2014. 2. Normal left resting  and post exercise ankle brachial indices. 3. Probable single-vessel runoff disease bilaterally in the posterior tibial artery distributions. Signed, Sterling BigHeath K. McCullough, MD [facility] Electronically Signed   By: Malachy MoanHeath  McCullough M.D.   On: 02/18/2015 10:21    Labs:  CBC:  Recent Labs  12/27/14 0730  WBC 4.7  HGB 12.4*  HCT 35.7*  PLT 156    COAGS:  Recent Labs  12/27/14 0730  INR 1.10  APTT 30    BMP:  Recent Labs  12/27/14 0730  NA 135  K 4.8  CL 98*  CO2 30  GLUCOSE 171*  BUN 15  CALCIUM 9.0  CREATININE 0.71  GFRNONAA >60  GFRAA >60    LIVER FUNCTION TESTS: No results for input(s): BILITOT, AST, ALT, ALKPHOS, PROT, ALBUMIN in the last 8760 hours.  Assessment:    Signed: D. Jeananne RamaKevin Doria Fern 02/18/2015, 10:24 AM   Please refer to Dr. Henri MedalMcCullough's attestation of this note for management and plan.

## 2015-03-13 DIAGNOSIS — E1151 Type 2 diabetes mellitus with diabetic peripheral angiopathy without gangrene: Secondary | ICD-10-CM | POA: Diagnosis not present

## 2015-03-13 DIAGNOSIS — I739 Peripheral vascular disease, unspecified: Secondary | ICD-10-CM | POA: Diagnosis not present

## 2015-03-13 DIAGNOSIS — I10 Essential (primary) hypertension: Secondary | ICD-10-CM | POA: Diagnosis not present

## 2015-07-01 ENCOUNTER — Telehealth: Payer: Self-pay | Admitting: Radiology

## 2015-07-01 ENCOUNTER — Other Ambulatory Visit (HOSPITAL_COMMUNITY): Payer: Self-pay | Admitting: Interventional Radiology

## 2015-07-01 DIAGNOSIS — I739 Peripheral vascular disease, unspecified: Secondary | ICD-10-CM

## 2015-07-01 NOTE — Progress Notes (Signed)
   Refill request for Plavix 75 mg daily From [address] [id number] with Dr Archer AsaMcCullough  Refill x 3  [facility] [phone]

## 2015-08-26 ENCOUNTER — Ambulatory Visit
Admission: RE | Admit: 2015-08-26 | Discharge: 2015-08-26 | Disposition: A | Discharge status: Home / Self Care | Payer: Medicare Other | Source: Ambulatory Visit | Attending: Interventional Radiology | Admitting: Interventional Radiology

## 2015-08-26 DIAGNOSIS — I739 Peripheral vascular disease, unspecified: Secondary | ICD-10-CM | POA: Diagnosis not present

## 2015-08-26 DIAGNOSIS — I743 Embolism and thrombosis of arteries of the lower extremities: Secondary | ICD-10-CM | POA: Diagnosis not present

## 2015-08-26 HISTORY — PX: IR GENERIC HISTORICAL: IMG1180011

## 2015-08-26 NOTE — Progress Notes (Signed)
Referring Physician(s): Dr. Nila Nephew  Supervising Physician: Malachy Moan  Patient Status: Outpt  Chief Complaint: PAD  Subjective: 70 yo male with PAD. Underwent successful Bilateral CIA stents last year. Feels good. Able to continue working and activity. He does report some right calf claudication, but basically works through it. He walked in a parade for about a mile, and though he noticed the pain in his calf, he did not have to stop.  He has no rest pain. Still taking ASA and Plavix, though he's not exactly sure. He does not smoke and good control of his BP and cholesterol. He is back today for follow up with US duplex studies.  Allergies: Review of patient's allergies indicates no known allergies.  Medications: Prior to Admission medications   Medication Sig Start Date End Date Taking? Authorizing Provider  aspirin EC 81 MG tablet Take 81 mg by mouth daily.   Yes Historical Provider, MD  furosemide (LASIX) 40 MG tablet Take 40 mg by mouth daily.   Yes Historical Provider, MD  hydrochlorothiazide (HYDRODIURIL) 25 MG tablet Take 25 mg by mouth daily.   Yes Historical Provider, MD  KRILL OIL PO Take 1 capsule by mouth daily.   Yes Historical Provider, MD  losartan (COZAAR) 100 MG tablet Take 100 mg by mouth daily.   Yes Historical Provider, MD  metFORMIN (GLUCOPHAGE) 1000 MG tablet Take 1,000 mg by mouth 2 (two) times daily with a meal.   Yes Historical Provider, MD  Multiple Vitamin (MULTIVITAMIN WITH MINERALS) TABS tablet Take 1 tablet by mouth daily.   Yes Historical Provider, MD  simvastatin (ZOCOR) 40 MG tablet Take 40 mg by mouth daily.   Yes Historical Provider, MD     Vital Signs: BP 147/77 mmHg  Pulse 72  Temp(Src) 97.8 F (36.6 C) (Oral)  Resp 14  Ht  (1.803 m)  Wt 178 lb (80.74 kg)  BMI 24.84 kg/m2  SpO2 98%  Physical Exam NAD Lungs: CTA Heart: Regular Ext: Bilateral femoral pulses intact. Legs warm, brisk cap refill. Pedal pulses  palpable, weaker on right. No ischemic changes or ulcers.   Imaging: [place] Low Ext Art Bil W/exercise  08/26/2015  CLINICAL DATA:  Peripheral vascular disease. Common iliac artery stents. EXAM: NONINVASIVE PHYSIOLOGIC VASCULAR STUDY OF BILATERAL LOWER EXTREMITIES WITH AND WITHOUT EXERCISE TECHNIQUE: Evaluation of both lower extremities were performed at rest, including calculation of ankle-brachial indices, multiple segmental pressure evaluation, segmental Doppler and segmental pulse volume recording. Ankle brachial indices were also obtained following treadmill exercise. COMPARISON:  02/18/2015 FINDINGS: RESTING Right ABI: 0.82 Left ABI: 1.08 Right lower extremity: The right femoral waveform is triphasic. Popliteal and tibial waveforms are monophasic. Pressure gradient from the right thigh to the calf is 40 mm Hg. Left lower extremity: The femoral, popliteal, and dorsalis pedis waveforms are triphasic while the posterior tibial is monophasic. POST EXERCISE Right ABI: 0.74 Left ABI: 1.05 IMPRESSION: In the left lower extremity, the examination is stable. There is no significant arterial occlusive disease. There has been the interval development of occlusive disease in the right femoral or popliteal system. Previously, there were triphasic waveforms at the right ankle and ankle brachial index was greater than 1. Today, waveforms are monophasic with a diminished ankle brachial index that dropped after exercise. Electronically Signed   By: Jolaine Click M.D.   On: 08/26/2015 09:44    Labs:  CBC:  Recent Labs  12/27/14 0730  WBC 4.7  HGB 12.4*  HCT 35.7*  PLT 156    COAGS:  Recent Labs  12/27/14 0730  INR 1.10  APTT 30    BMP:  Recent Labs  12/27/14 0730  NA 135  K 4.8  CL 98*  CO2 30  GLUCOSE 171*  BUN 15  CALCIUM 9.0  CREATININE 0.71  GFRNONAA >60  GFRAA >60    LIVER FUNCTION TESTS: No results for input(s): BILITOT, AST, ALT, ALKPHOS, PROT, ALBUMIN in the last 8760  hours.  Assessment and Plan: PAD s/p bilateral CIA stent angioplasty last year. Progressed (R)SFA disease by duplex, ABI is down some. Pt is symptomatic but this is not interfering in his lifestyle/work. Cont walking regimen, ASA/Plavix and risk factor control. Will continue yearly follow up with US duplex. Can call clinic sooner if sxs worsen as certainly an arteriogram/treatment is an available option.  Electronically Signed: Brayton ElBRUNING, Deanza Upperman 08/26/2015, 9:49 AM   I spent a total of 25 Minutes at the the patient's bedside AND on the patient's hospital floor or unit, greater than 50% of which was counseling/coordinating care for PAD/claudication

## 2015-10-28 DIAGNOSIS — E1165 Type 2 diabetes mellitus with hyperglycemia: Secondary | ICD-10-CM | POA: Diagnosis not present

## 2015-10-28 DIAGNOSIS — D559 Anemia due to enzyme disorder, unspecified: Secondary | ICD-10-CM | POA: Diagnosis not present

## 2015-10-28 DIAGNOSIS — K921 Melena: Secondary | ICD-10-CM | POA: Diagnosis not present

## 2015-10-28 DIAGNOSIS — Z0000 Encounter for general adult medical examination without abnormal findings: Secondary | ICD-10-CM | POA: Diagnosis not present

## 2015-10-28 DIAGNOSIS — E7800 Pure hypercholesterolemia, unspecified: Secondary | ICD-10-CM | POA: Diagnosis not present

## 2015-10-28 DIAGNOSIS — Z125 Encounter for screening for malignant neoplasm of prostate: Secondary | ICD-10-CM | POA: Diagnosis not present

## 2015-10-28 DIAGNOSIS — I10 Essential (primary) hypertension: Secondary | ICD-10-CM | POA: Diagnosis not present

## 2015-10-28 DIAGNOSIS — E109 Type 1 diabetes mellitus without complications: Secondary | ICD-10-CM | POA: Diagnosis not present

## 2015-10-28 DIAGNOSIS — E785 Hyperlipidemia, unspecified: Secondary | ICD-10-CM | POA: Diagnosis not present

## 2015-10-28 DIAGNOSIS — I739 Peripheral vascular disease, unspecified: Secondary | ICD-10-CM | POA: Diagnosis not present

## 2015-11-04 DIAGNOSIS — E1165 Type 2 diabetes mellitus with hyperglycemia: Secondary | ICD-10-CM | POA: Diagnosis not present

## 2015-11-04 DIAGNOSIS — I10 Essential (primary) hypertension: Secondary | ICD-10-CM | POA: Diagnosis not present

## 2015-11-04 DIAGNOSIS — K921 Melena: Secondary | ICD-10-CM | POA: Diagnosis not present

## 2015-11-04 DIAGNOSIS — I739 Peripheral vascular disease, unspecified: Secondary | ICD-10-CM | POA: Diagnosis not present

## 2015-11-20 DIAGNOSIS — I878 Other specified disorders of veins: Secondary | ICD-10-CM | POA: Diagnosis not present

## 2015-11-20 DIAGNOSIS — G479 Sleep disorder, unspecified: Secondary | ICD-10-CM | POA: Diagnosis not present

## 2015-11-20 DIAGNOSIS — I10 Essential (primary) hypertension: Secondary | ICD-10-CM | POA: Diagnosis not present

## 2015-12-16 ENCOUNTER — Observation Stay (HOSPITAL_COMMUNITY)
Admission: EM | Admit: 2015-12-16 | Discharge: 2015-12-17 | Disposition: A | Discharge status: Home / Self Care | Payer: Medicare Other | Attending: Internal Medicine | Admitting: Internal Medicine

## 2015-12-16 ENCOUNTER — Emergency Department (HOSPITAL_COMMUNITY): Payer: Medicare Other

## 2015-12-16 ENCOUNTER — Encounter (HOSPITAL_COMMUNITY): Payer: Self-pay | Admitting: Emergency Medicine

## 2015-12-16 DIAGNOSIS — Z7982 Long term (current) use of aspirin: Secondary | ICD-10-CM | POA: Insufficient documentation

## 2015-12-16 DIAGNOSIS — I739 Peripheral vascular disease, unspecified: Secondary | ICD-10-CM | POA: Diagnosis present

## 2015-12-16 DIAGNOSIS — I10 Essential (primary) hypertension: Secondary | ICD-10-CM

## 2015-12-16 DIAGNOSIS — R739 Hyperglycemia, unspecified: Secondary | ICD-10-CM

## 2015-12-16 DIAGNOSIS — E119 Type 2 diabetes mellitus without complications: Secondary | ICD-10-CM

## 2015-12-16 DIAGNOSIS — E1151 Type 2 diabetes mellitus with diabetic peripheral angiopathy without gangrene: Secondary | ICD-10-CM | POA: Insufficient documentation

## 2015-12-16 DIAGNOSIS — R55 Syncope and collapse: Secondary | ICD-10-CM | POA: Insufficient documentation

## 2015-12-16 DIAGNOSIS — Z7984 Long term (current) use of oral hypoglycemic drugs: Secondary | ICD-10-CM | POA: Diagnosis not present

## 2015-12-16 DIAGNOSIS — Z87891 Personal history of nicotine dependence: Secondary | ICD-10-CM | POA: Diagnosis not present

## 2015-12-16 DIAGNOSIS — A045 Campylobacter enteritis: Secondary | ICD-10-CM | POA: Diagnosis not present

## 2015-12-16 DIAGNOSIS — Z7902 Long term (current) use of antithrombotics/antiplatelets: Secondary | ICD-10-CM | POA: Diagnosis not present

## 2015-12-16 DIAGNOSIS — A419 Sepsis, unspecified organism: Principal | ICD-10-CM

## 2015-12-16 DIAGNOSIS — R197 Diarrhea, unspecified: Secondary | ICD-10-CM | POA: Diagnosis not present

## 2015-12-16 DIAGNOSIS — E785 Hyperlipidemia, unspecified: Secondary | ICD-10-CM | POA: Diagnosis not present

## 2015-12-16 DIAGNOSIS — R05 Cough: Secondary | ICD-10-CM

## 2015-12-16 DIAGNOSIS — R059 Cough, unspecified: Secondary | ICD-10-CM

## 2015-12-16 DIAGNOSIS — R404 Transient alteration of awareness: Secondary | ICD-10-CM | POA: Diagnosis not present

## 2015-12-16 DIAGNOSIS — E1165 Type 2 diabetes mellitus with hyperglycemia: Secondary | ICD-10-CM | POA: Insufficient documentation

## 2015-12-16 DIAGNOSIS — E860 Dehydration: Secondary | ICD-10-CM | POA: Diagnosis not present

## 2015-12-16 DIAGNOSIS — R509 Fever, unspecified: Secondary | ICD-10-CM | POA: Diagnosis not present

## 2015-12-16 DIAGNOSIS — Z79899 Other long term (current) drug therapy: Secondary | ICD-10-CM | POA: Insufficient documentation

## 2015-12-16 DIAGNOSIS — Z95820 Peripheral vascular angioplasty status with implants and grafts: Secondary | ICD-10-CM | POA: Diagnosis not present

## 2015-12-16 HISTORY — DX: Essential (primary) hypertension: I10

## 2015-12-16 LAB — SAVE SMEAR

## 2015-12-16 LAB — URINE MICROSCOPIC-ADD ON: RBC / HPF: NONE SEEN RBC/hpf (ref 0–5)

## 2015-12-16 LAB — COMPREHENSIVE METABOLIC PANEL
ALK PHOS: 68 U/L (ref 38–126)
ALT: 17 U/L (ref 17–63)
ANION GAP: 12 (ref 5–15)
AST: 25 U/L (ref 15–41)
Albumin: 3.8 g/dL (ref 3.5–5.0)
BILIRUBIN TOTAL: 1.6 mg/dL — AB (ref 0.3–1.2)
BUN: 15 mg/dL (ref 6–20)
CALCIUM: 8.7 mg/dL — AB (ref 8.9–10.3)
CO2: 24 mmol/L (ref 22–32)
Chloride: 91 mmol/L — ABNORMAL LOW (ref 101–111)
Creatinine, Ser: 1.06 mg/dL (ref 0.61–1.24)
GFR calc non Af Amer: >60 mL/min (ref >60)
GLUCOSE: 362 mg/dL — AB (ref 65–99)
Potassium: 4.2 mmol/L (ref 3.5–5.1)
Sodium: 127 mmol/L — ABNORMAL LOW (ref 135–145)
TOTAL PROTEIN: 7.2 g/dL (ref 6.5–8.1)

## 2015-12-16 LAB — CBC WITH DIFFERENTIAL/PLATELET
BASOS PCT: 0 %
Basophils Absolute: 0 10*3/uL (ref 0.0–0.1)
EOS ABS: 0 10*3/uL (ref 0.0–0.7)
Eosinophils Relative: 0 %
HCT: 33.7 % — ABNORMAL LOW (ref 39.0–52.0)
Hemoglobin: 11.2 g/dL — ABNORMAL LOW (ref 13.0–17.0)
Lymphocytes Relative: 4 %
Lymphs Abs: 0.5 10*3/uL — ABNORMAL LOW (ref 0.7–4.0)
MCH: 30 pg (ref 26.0–34.0)
MCHC: 33.2 g/dL (ref 30.0–36.0)
MCV: 90.3 fL (ref 78.0–100.0)
Monocytes Absolute: 2.5 10*3/uL — ABNORMAL HIGH (ref 0.1–1.0)
Monocytes Relative: 20 %
NEUTROS PCT: 76 %
Neutro Abs: 9.3 10*3/uL — ABNORMAL HIGH (ref 1.7–7.7)
PLATELETS: 155 10*3/uL (ref 150–400)
RBC: 3.73 MIL/uL — ABNORMAL LOW (ref 4.22–5.81)
RDW: 15 % (ref 11.5–15.5)
WBC: 12.3 10*3/uL — AB (ref 4.0–10.5)

## 2015-12-16 LAB — LACTIC ACID, PLASMA
LACTIC ACID, VENOUS: 2.5 mmol/L — AB (ref 0.5–1.9)
Lactic Acid, Venous: 2.3 mmol/L (ref 0.5–1.9)

## 2015-12-16 LAB — URINALYSIS, ROUTINE W REFLEX MICROSCOPIC
BILIRUBIN URINE: NEGATIVE
HGB URINE DIPSTICK: NEGATIVE
Ketones, ur: 15 mg/dL — AB
Leukocytes, UA: NEGATIVE
Nitrite: NEGATIVE
PH: 5.5 (ref 5.0–8.0)
Protein, ur: NEGATIVE mg/dL
SPECIFIC GRAVITY, URINE: 1.024 (ref 1.005–1.030)

## 2015-12-16 LAB — GASTROINTESTINAL PANEL BY PCR, STOOL (REPLACES STOOL CULTURE)
Adenovirus F40/41: NOT DETECTED
Astrovirus: NOT DETECTED
CRYPTOSPORIDIUM: NOT DETECTED
CYCLOSPORA CAYETANENSIS: NOT DETECTED
Campylobacter species: DETECTED — AB
E. COLI O157: NOT DETECTED
ENTAMOEBA HISTOLYTICA: NOT DETECTED
Enteroaggregative E coli (EAEC): NOT DETECTED
Enteropathogenic E coli (EPEC): DETECTED — AB
Enterotoxigenic E coli (ETEC): NOT DETECTED
Giardia lamblia: NOT DETECTED
Norovirus GI/GII: NOT DETECTED
Plesimonas shigelloides: NOT DETECTED
Rotavirus A: NOT DETECTED
SALMONELLA SPECIES: NOT DETECTED
SAPOVIRUS (I, II, IV, AND V): NOT DETECTED
SHIGELLA/ENTEROINVASIVE E COLI (EIEC): NOT DETECTED
Shiga like toxin producing E coli (STEC): NOT DETECTED
VIBRIO CHOLERAE: NOT DETECTED
VIBRIO SPECIES: NOT DETECTED
YERSINIA ENTEROCOLITICA: NOT DETECTED

## 2015-12-16 LAB — I-STAT TROPONIN, ED: Troponin i, poc: 0 ng/mL (ref 0.00–0.08)

## 2015-12-16 LAB — LIPASE, BLOOD: Lipase: 24 U/L (ref 11–51)

## 2015-12-16 LAB — GLUCOSE, CAPILLARY: Glucose-Capillary: 323 mg/dL — ABNORMAL HIGH (ref 65–99)

## 2015-12-16 LAB — I-STAT CG4 LACTIC ACID, ED: LACTIC ACID, VENOUS: 2.2 mmol/L — AB (ref 0.5–1.9)

## 2015-12-16 LAB — CK: Total CK: 52 U/L (ref 49–397)

## 2015-12-16 MED ORDER — CLOPIDOGREL BISULFATE 75 MG PO TABS
75.0000 mg | ORAL_TABLET | Freq: Every day | ORAL | Status: DC
  Administered 2015-12-17: 75 mg via ORAL
  Filled 2015-12-16: qty 1

## 2015-12-16 MED ORDER — LOSARTAN POTASSIUM 50 MG PO TABS: 100.0000 mg | ORAL_TABLET | Freq: Every day | ORAL | Status: DC

## 2015-12-16 MED ORDER — DEXTROSE 5 % IV SOLN: 1.0000 g | Freq: Once | INTRAVENOUS | Status: DC

## 2015-12-16 MED ORDER — INSULIN ASPART 100 UNIT/ML ~~LOC~~ SOLN
5.0000 [IU] | Freq: Once | SUBCUTANEOUS | Status: AC
Start: 2015-12-16 — End: 2015-12-16
  Administered 2015-12-16: 5 [IU] via SUBCUTANEOUS

## 2015-12-16 MED ORDER — ENOXAPARIN SODIUM 40 MG/0.4ML ~~LOC~~ SOLN: 40.0000 mg | SUBCUTANEOUS | Status: DC

## 2015-12-16 MED ORDER — ACETAMINOPHEN 325 MG PO TABS
650.0000 mg | ORAL_TABLET | Freq: Four times a day (QID) | ORAL | Status: DC | PRN
  Administered 2015-12-16: 650 mg via ORAL
  Filled 2015-12-16: qty 2

## 2015-12-16 MED ORDER — INSULIN ASPART 100 UNIT/ML ~~LOC~~ SOLN
0.0000 [IU] | Freq: Three times a day (TID) | SUBCUTANEOUS | Status: DC
  Administered 2015-12-17: 8 [IU] via SUBCUTANEOUS
  Administered 2015-12-17: 5 [IU] via SUBCUTANEOUS

## 2015-12-16 MED ORDER — SODIUM CHLORIDE 0.9 % IV BOLUS (SEPSIS)
1000.0000 mL | Freq: Once | INTRAVENOUS | Status: AC
  Administered 2015-12-16: 1000 mL via INTRAVENOUS

## 2015-12-16 MED ORDER — ONDANSETRON HCL 4 MG PO TABS: 4.0000 mg | ORAL_TABLET | Freq: Four times a day (QID) | ORAL | Status: DC | PRN

## 2015-12-16 MED ORDER — HYDROCHLOROTHIAZIDE 25 MG PO TABS: 25.0000 mg | ORAL_TABLET | Freq: Every day | ORAL | Status: DC

## 2015-12-16 MED ORDER — DEXTROSE 5 % IV SOLN
1.0000 g | INTRAVENOUS | Status: DC
  Administered 2015-12-16: 1 g via INTRAVENOUS
  Filled 2015-12-16: qty 10

## 2015-12-16 MED ORDER — AZITHROMYCIN 500 MG PO TABS
500.0000 mg | ORAL_TABLET | Freq: Every day | ORAL | Status: DC
  Administered 2015-12-17: 500 mg via ORAL
  Filled 2015-12-16: qty 1

## 2015-12-16 MED ORDER — FUROSEMIDE 40 MG PO TABS: 40.0000 mg | ORAL_TABLET | Freq: Every day | ORAL | Status: DC

## 2015-12-16 MED ORDER — DEXTROSE 5 % IV SOLN: 500.0000 mg | Freq: Once | INTRAVENOUS | Status: DC

## 2015-12-16 MED ORDER — SIMVASTATIN 40 MG PO TABS
40.0000 mg | ORAL_TABLET | Freq: Every day | ORAL | Status: DC
  Administered 2015-12-16: 40 mg via ORAL
  Filled 2015-12-16: qty 1

## 2015-12-16 MED ORDER — ACETAMINOPHEN 650 MG RE SUPP: 650.0000 mg | Freq: Four times a day (QID) | RECTAL | Status: DC | PRN

## 2015-12-16 MED ORDER — ACETAMINOPHEN 325 MG PO TABS
650.0000 mg | ORAL_TABLET | Freq: Once | ORAL | Status: AC
  Administered 2015-12-16: 650 mg via ORAL
  Filled 2015-12-16: qty 2

## 2015-12-16 MED ORDER — DEXTROSE 5 % IV SOLN
500.0000 mg | INTRAVENOUS | Status: DC
  Administered 2015-12-16: 500 mg via INTRAVENOUS
  Filled 2015-12-16: qty 500

## 2015-12-16 MED ORDER — TRAZODONE HCL 50 MG PO TABS
25.0000 mg | ORAL_TABLET | Freq: Every evening | ORAL | Status: DC | PRN
  Administered 2015-12-16: 25 mg via ORAL
  Filled 2015-12-16: qty 1

## 2015-12-16 MED ORDER — ASPIRIN EC 81 MG PO TBEC
81.0000 mg | DELAYED_RELEASE_TABLET | Freq: Every day | ORAL | Status: DC
Start: 2015-12-16 — End: 2015-12-17
  Administered 2015-12-17: 81 mg via ORAL
  Filled 2015-12-16: qty 1

## 2015-12-16 MED ORDER — HYDROCODONE-ACETAMINOPHEN 5-325 MG PO TABS: 1.0000 | ORAL_TABLET | ORAL | Status: DC | PRN

## 2015-12-16 MED ORDER — ONDANSETRON HCL 4 MG/2ML IJ SOLN: 4.0000 mg | Freq: Four times a day (QID) | INTRAMUSCULAR | Status: DC | PRN

## 2015-12-16 MED ORDER — SODIUM CHLORIDE 0.9 % IV SOLN
Freq: Once | INTRAVENOUS | Status: AC
  Administered 2015-12-16: 14:00:00 via INTRAVENOUS

## 2015-12-16 NOTE — ED Provider Notes (Signed)
[facility]-EMERGENCY DEPT Provider Note   CSN: [id number] Arrival date & time: 12/16/15  [id number]     History   Chief Complaint Chief Complaint  Patient presents with  . Near Syncope    HPI Randy Jacobson is a 70 y.o. male.  Randy Jacobson is a 70 y.o. Male who presents to the emergency department by EMS after a near syncopal episode prior to arrival today. The patient reports this weekend he ate to apples and has not felt himself since. He reports he felt like he had the flu. He describes feeling fatigued, body aches, chills and subjective fever. He reports a slight cough. He reports having several episodes of diarrhea. No hematemesis or hematochezia. No nausea or vomiting. 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 His girlfriend at bedside is worried about chemical exposure. The patient reports he last sprayed any chemicals or was around them was a week ago. He sprayed round-up on weeds. He reports going to his PCP office today where he had a near syncopal episode. He reports lying down to be examined and then when sitting up he felt lightheaded and that he might pass out. He had a near syncopal episode while sitting up with the M.D. in the room. He denies having any chest pain or shortness of breath prior to the near-syncopal episode. He currently feels better and does not feel lightheaded with position change. He has not taken any of his medications today. He denies abdominal pain, nausea, vomiting, rashes, insect bites, tick bites, neck pain, chest pain, shortness of breath, palpitations, urinary symptoms, hemoptysis or hematochezia.   The history is provided by the patient and a friend. No language interpreter was used.  Near Syncope  Pertinent negatives include no chest pain, no abdominal pain, no headaches and no shortness of breath.    Past Medical History:  Diagnosis Date  . Diabetes mellitus without complication (HCC)   . Hypertension     Patient Active Problem  List   Diagnosis Date Noted  . Claudication of right lower extremity (HCC)   . PAD (peripheral artery disease) (HCC)     Past Surgical History:  Procedure Laterality Date  . "stents in both my legs"         Home Medications    Prior to Admission medications   Medication Sig Start Date End Date Taking? Authorizing Provider  acetaminophen (TYLENOL) 325 MG tablet Take 650 mg by mouth every 6 (six) hours as needed for mild pain.   Yes Historical Provider, MD  amitriptyline (ELAVIL) 10 MG tablet Take 10 mg by mouth daily. 11/20/15  Yes Historical Provider, MD  aspirin EC 81 MG tablet Take 81 mg by mouth daily.   Yes Historical Provider, MD  clopidogrel (PLAVIX) 75 MG tablet Take 75 mg by mouth daily. 10/30/15  Yes Historical Provider, MD  furosemide (LASIX) 40 MG tablet Take 40 mg by mouth daily.   Yes Historical Provider, MD  glimepiride (AMARYL) 4 MG tablet Take 4 mg by mouth daily. 09/08/15  Yes Historical Provider, MD  hydrochlorothiazide (HYDRODIURIL) 25 MG tablet Take 25 mg by mouth daily.   Yes Historical Provider, MD  KRILL OIL PO Take 1 capsule by mouth daily.   Yes Historical Provider, MD  losartan (COZAAR) 100 MG tablet Take 100 mg by mouth daily.   Yes Historical Provider, MD  metFORMIN (GLUCOPHAGE) 1000 MG tablet Take 1,000 mg by mouth 2 (two) times daily with a meal.   Yes  Historical Provider, MD  Multiple Vitamin (MULTIVITAMIN WITH MINERALS) TABS tablet Take 1 tablet by mouth daily.   Yes Historical Provider, MD  simvastatin (ZOCOR) 40 MG tablet Take 40 mg by mouth daily.   Yes Historical Provider, MD    Family History History reviewed. No pertinent family history.  Social History Social History  Substance Use Topics  . Smoking status: Former Smoker    Packs/day: 1.00    Types: Cigarettes    Start date: 11/20/1957    Quit date: 11/20/1993  . Smokeless tobacco: Never Used  . Alcohol use 0.0 oz/week     Comment: Beer 12 pack/week        Allergies   Review of  patient's allergies indicates no known allergies.   Review of Systems Review of Systems  Constitutional: Positive for chills, fatigue and fever.  HENT: Negative for congestion, ear pain and sore throat.   Eyes: Negative for visual disturbance.  Respiratory: Positive for cough. Negative for shortness of breath and wheezing.   Cardiovascular: Positive for near-syncope. Negative for chest pain.  Gastrointestinal: Positive for diarrhea. Negative for abdominal pain, blood in stool, nausea and vomiting.  Genitourinary: Negative for difficulty urinating, dysuria, frequency and hematuria.  Musculoskeletal: Negative for back pain and neck pain.  Skin: Negative for rash and wound.  Neurological: Positive for syncope (Near syncope.) and light-headedness. Negative for dizziness, weakness, numbness and headaches.     Physical Exam Updated Vital Signs BP 119/67   Pulse 80   Temp 101.2 F (38.4 C) (Rectal)   Resp 12   Ht 5\' 11"  (1.803 m)   Wt 79.4 kg   SpO2 97%   BMI 24.41 kg/m   Physical Exam  Constitutional: He is oriented to person, place, and time. He appears well-developed and well-nourished. No distress.  Nontoxic appearing.  HENT:  Head: Normocephalic and atraumatic.  Right Ear: External ear normal.  Left Ear: External ear normal.  Mouth/Throat: Oropharynx is clear and moist.  Mucous membranes are moist.  Eyes: Conjunctivae and EOM are normal. Pupils are equal, round, and reactive to light. Right eye exhibits no discharge. Left eye exhibits no discharge.  Neck: Normal range of motion. Neck supple. No JVD present.  Cardiovascular: Normal rate, regular rhythm, normal heart sounds and intact distal pulses.  Exam reveals no gallop and no friction rub.   No murmur heard. Pulmonary/Chest: Effort normal and breath sounds normal. No stridor. No respiratory distress. He has no wheezes. He has no rales.  Lungs clear to auscultation bilaterally.  Abdominal: Soft. Bowel sounds are normal.  He exhibits no distension. There is no tenderness. There is no guarding.  Abdomen is soft and nontender to palpation. Bowel sounds are present.  Musculoskeletal: He exhibits no edema or tenderness.  No lower extremity edema or tenderness.  Lymphadenopathy:    He has no cervical adenopathy.  Neurological: He is alert and oriented to person, place, and time. No cranial nerve deficit. Coordination normal.  He is alert and oriented 3. Cranial nerves are intact. Speech is clear and coherent. Sensation is intact in his bilateral upper and lower extremities. EOMs are intact. Vision is grossly intact.  Skin: Skin is warm and dry. Capillary refill takes less than 2 seconds. No rash noted. He is not diaphoretic. No erythema. No pallor.  Psychiatric: He has a normal mood and affect. His behavior is normal.  Nursing note and vitals reviewed.    ED Treatments / Results  Labs (all labs ordered are listed, but only  abnormal results are displayed) Labs Reviewed  COMPREHENSIVE METABOLIC PANEL - Abnormal; Notable for the following:       Result Value   Sodium 127 (*)    Chloride 91 (*)    Glucose, Bld 362 (*)    Calcium 8.7 (*)    Total Bilirubin 1.6 (*)    All other components within normal limits  CBC WITH DIFFERENTIAL/PLATELET - Abnormal; Notable for the following:    WBC 12.3 (*)    RBC 3.73 (*)    Hemoglobin 11.2 (*)    HCT 33.7 (*)    Neutro Abs 9.3 (*)    Lymphs Abs 0.5 (*)    Monocytes Absolute 2.5 (*)    All other components within normal limits  URINALYSIS, ROUTINE W REFLEX MICROSCOPIC (NOT AT [facility]) - Abnormal; Notable for the following:    Glucose, UA >1000 (*)    Ketones, ur 15 (*)    All other components within normal limits  URINE MICROSCOPIC-ADD ON - Abnormal; Notable for the following:    Squamous Epithelial / LPF 0-5 (*)    Bacteria, UA RARE (*)    Casts HYALINE CASTS (*)    All other components within normal limits  I-STAT CG4 LACTIC ACID, ED - Abnormal; Notable for the  following:    Lactic Acid, Venous 2.20 (*)    All other components within normal limits  CULTURE, BLOOD (ROUTINE X 2)  CULTURE, BLOOD (ROUTINE X 2)  URINE CULTURE  GASTROINTESTINAL PANEL BY PCR, STOOL (REPLACES STOOL CULTURE)  LIPASE, BLOOD  I-STAT TROPOININ, ED   Vitals:   12/16/15 1200 12/16/15 1215 12/16/15 1300 12/16/15 1315  BP: 111/63 109/64 131/60 119/67  Pulse: 83 79 81 80  Resp: 19 13 19 12   Temp:      TempSrc:      SpO2: 97% 95% 96% 97%  Weight:      Height:        EKG  EKG Interpretation  Date/Time:  Tuesday December 16 2015 09:22:26 EDT Ventricular Rate:  96 PR Interval:    QRS Duration: 157 QT Interval:  376 QTC Calculation: 476 R Axis:   90 Text Interpretation:  Sinus rhythm RBBB and LPFB Nonspecific ST abnormality No previous tracing Confirmed by Denton Lank  MD, Caryn Bee ([id number]) on 12/16/2015 9:26:35 AM       Radiology Dg Chest 2 View  Result Date: 12/16/2015 CLINICAL DATA:  Cough, fever and chills for 5 days. EXAM: CHEST  2 VIEW COMPARISON:  12/23/2008 FINDINGS: The cardiac silhouette, mediastinal and hilar contours are within normal limits and stable. There is mild tortuosity and calcification of the thoracic aorta. No infiltrates, edema or effusions. Minimal apical scarring changes. The bony thorax is intact. IMPRESSION: No acute cardiopulmonary findings. Aortic atherosclerosis. Electronically Signed   By: Rudie Meyer M.D.   On: 12/16/2015 10:34    Procedures Procedures (including critical care time)  Medications Ordered in ED Medications  azithromycin (ZITHROMAX) 500 mg in dextrose 5 % 250 mL IVPB (0 mg Intravenous Stopped 12/16/15 1349)  cefTRIAXone (ROCEPHIN) 1 g in dextrose 5 % 50 mL IVPB (0 g Intravenous Stopped 12/16/15 1328)  sodium chloride 0.9 % bolus 1,000 mL (0 mLs Intravenous Stopped 12/16/15 1035)  acetaminophen (TYLENOL) tablet 650 mg (650 mg Oral Given 12/16/15 1025)  sodium chloride 0.9 % bolus 1,000 mL (0 mLs Intravenous Stopped 12/16/15 1207)    0.9 %  sodium chloride infusion ( Intravenous New Bag/Given 12/16/15 1351)     Initial Impression /  Assessment and Plan / ED Course  I have reviewed the triage vital signs and the nursing notes.  Pertinent labs & imaging results that were available during my care of the patient were reviewed by me and considered in my medical decision making (see chart for details).  Clinical Course  Comment By Time  Elevated lactic, rectal temp of 101 and WBC of 12,000. Activated code sepsis. Patient getting CXR currently.  Everlene Farrier, PA-C 09/05 1022   This is a 70 y.o. Male who presents to the emergency department by EMS after a near syncopal episode prior to arrival today. The patient reports this weekend he ate to apples and has not felt himself since. He reports he felt like he had the flu. He describes feeling fatigued, body aches, chills and subjective fever. He reports a slight cough. He reports having several episodes of diarrhea. No hematemesis or hematochezia. No nausea or vomiting. 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 His girlfriend at bedside is worried about chemical exposure. The patient reports he last sprayed any chemicals or was around them was a week ago. He sprayed round-up on weeds. He reports going to his PCP office today where he had a near syncopal episode. He reports lying down to be examined and then when sitting up he felt lightheaded and that he might pass out. He had a near syncopal episode while sitting up with the M.D. in the room. He denies having any chest pain or shortness of breath prior to the near-syncopal episode.  Patient has a rectal temp of 101.2 on arrival to his room. Lactic acid was ordered. On exam he is non-toxic appearing. Lungs are clear to ascultation bilaterally. Abdomen is soft and non-tender to palpation. No rashes noted.  No focal neurological deficits.  Lactic return mildly elevated at 2.20. With fever, elevated lactic and white  blood cell count of over 12,000 code sepsis was activated. Chest x-ray revealed no acute findings. Urinalysis was remarkable for glucosuria. No evidence of infection. Stool PCR panel in process. After a discussion and evaluation by my attending, Dr. Denton Lank, will start on CAP antibiotics and admit. Patient's main complaint is cough. PCR panel pending. No recent antibiotic use. Patient agrees with plan for admission.   I consulted with Gunnar Fusi NP from [facility] who accepts the patient for admission and she will place admission orders.   This patient was discussed with and evaluated by Dr. Denton Lank who agrees with assessment and plan.   Final Clinical Impressions(s) / ED Diagnoses   Final diagnoses:  Sepsis, due to unspecified organism [facility])  Near syncope  Hyperglycemia  Cough  Diarrhea, unspecified type    New Prescriptions New Prescriptions   No medications on file     Everlene Farrier, PA-C 12/16/15 1542    Cathren Laine, MD 12/17/15 [phone]

## 2015-12-16 NOTE — Progress Notes (Signed)
CRITICAL VALUE ALERT  Critical value received:  Lactic acid 2.5   Date of notification:  12/16/15  Time of notification:  1719  Critical value read back:Yes.    Nurse who received alert:  Warnell BureauErica Peri Kreft  MD notified (1st page):  Wilmon PaliGuenther, NP  Time of first page:  1727  MD notified (2nd page):  Time of second page:  Responding MD:  Wilmon PaliGuenther, NP  Time MD responded:  1729  No new orders at this time. Pt's nurse was notified of critical lab value.

## 2015-12-16 NOTE — ED Triage Notes (Signed)
Pt has been having diarrhea for past few days. Pt states he believes it's from [place] apples from [place]. Pt went to MD office today, sat up and had near syncopal episode. EMS reports pt was pale and diaphoretic. When ems arrived, pt had good color and no longer diaphoretic, no further episodes. MD witnessed. EKG unremarkable. Pt alert and oriented. BP 129/94, HR 97, CBG 371.

## 2015-12-16 NOTE — Care Management Obs Status (Signed)
MEDICARE OBSERVATION STATUS NOTIFICATION   Patient Details  Name: Randy Jacobson MRN: [id number] Date of Birth: 01-Nov-1945   Medicare Observation Status Notification Given:  Yes (explained observation letter and answered questions)    Antony HasteBennett, Lavaya Defreitas Harris, RN 12/16/2015, 6:49 PM

## 2015-12-16 NOTE — Progress Notes (Signed)
CRITICAL VALUE ALERT  Critical value received:  Positive GI PCR  Date of notification:  12/16/15  Time of notification:  1700  Critical value read back:Yes.    Nurse who received alert:  Mickie Hillierynthia [place],RN  MD notified (1st page):  Konrad DoloresMerrell, D.   Time of first page:  1502  MD notified (2nd page):  Time of second page:  Responding MD: pending  Time MD responded:

## 2015-12-16 NOTE — ED Notes (Addendum)
Pt states he ate 2 apples that had chemicals on them on Thursday and has not felt "himself" since then. Pt states he has felt cold chills over the weekend and tired. "Just haven't felt good." Pt has had diarrhea since eating the apples.

## 2015-12-16 NOTE — Progress Notes (Addendum)
Patient arrived from ED. Bp 168/73, Temp 98.2, HR 108, 94% on room air

## 2015-12-16 NOTE — H&P (Signed)
History and Physical    Randy JoinerJohn T Leer UJW:[id number] DOB: [birth year] DOA: 12/16/2015  PCP:  Elmore GuiseEd Green, M.D.  Patient coming from:   Home  Chief Complaint:  diarrhea, weakness.   HPI: Randy Jacobson is a 70 y.o. male with a medical history significant for DM2, HTN and PAD. Patient enjoys an active lifestyle. 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 Around Thursday of last week patient ate some unwashed apples purchased on the way to [place]. Friday he developed nonbloody diarrhea having up to 6 loose watery bowel movements a day. No associated nausea, vomiting or abdominal pain. Symptoms persisted, girlfriend thought patient was a little warm to touch last night. Additionally his mental status is a little abnormal. No known sick contacts. He has been drinking extra fluids and urinating well. No recent travel out of the country. No recent antibiotics.. Patient takes metformin but has taken it for nearly 10 years. No other medication changes.   Girlfriend mentions that patient developed a dry hacking cough a few days ago.   ED Course:  101, heart rate initially 96, down to 80, normal respirations, normotensive , normal O2 sats on room air No acute findings on chest x-ray GI pathogen panel pending Lactic acid 2.2 Sodium 127 , glucose 362. RX: 2 liter fluid bolus, rocephin , azithromycin, tylenol    Review of Systems: As per HPI, otherwise 10 point review of systems negative.   Past Medical History:  Diagnosis Date  . Diabetes mellitus without complication (HCC)   . Hypertension     Past Surgical History:  Procedure Laterality Date  . "stents in both my legs"      Social History   Social History  . Marital status: Single    Spouse name: N/A  . Number of children: N/A  . Years of education: N/A   Occupational History  . Not on file.   Social History Main Topics  . Smoking status: Former Smoker    Packs/day: 1.00    Types: Cigarettes    Start date: 11/20/1957    Quit date:  11/20/1993  . Smokeless tobacco: Never Used  . Alcohol use 0.0 oz/week     Comment: Beer 12 pack/week     . Drug use: No     Comment: Hx of marijuana use     . Sexual activity: Not on file   Other Topics Concern  . Not on file   Social History Narrative  . No narrative on file   [place]. His girlfriend lives within a few miles and checks on him regularly. No assistive devices needed for ambulation No Known Allergies  Family History  Problem Relation Age of Onset  . Diabetes Brother     Prior to Admission medications   Medication Sig Start Date End Date Taking? Authorizing Provider  aspirin EC 81 MG tablet Take 81 mg by mouth daily.    Historical Provider, MD  furosemide (LASIX) 40 MG tablet Take 40 mg by mouth daily.    Historical Provider, MD  hydrochlorothiazide (HYDRODIURIL) 25 MG tablet Take 25 mg by mouth daily.    Historical Provider, MD  KRILL OIL PO Take 1 capsule by mouth daily.    Historical Provider, MD  losartan (COZAAR) 100 MG tablet Take 100 mg by mouth daily.    Historical Provider, MD  metFORMIN (GLUCOPHAGE) 1000 MG tablet Take 1,000 mg by mouth 2 (two) times daily with a meal.    Historical  Provider, MD  Multiple Vitamin (MULTIVITAMIN WITH MINERALS) TABS tablet Take 1 tablet by mouth daily.    Historical Provider, MD  simvastatin (ZOCOR) 40 MG tablet Take 40 mg by mouth daily.    Historical Provider, MD    Physical Exam: Vitals:   12/16/15 1200 12/16/15 1215 12/16/15 1300 12/16/15 1315  BP: 111/63 109/64 131/60 119/67  Pulse: 83 79 81 80  Resp: 19 13 19 12   Temp:      TempSrc:      SpO2: 97% 95% 96% 97%  Weight:      Height:        Constitutional:  Pleasant well developed white male in NAD, calm, comfortable Vitals:   12/16/15 1200 12/16/15 1215 12/16/15 1300 12/16/15 1315  BP: 111/63 109/64 131/60 119/67  Pulse: 83 79 81 80  Resp: 19 13 19 12   Temp:      TempSrc:      SpO2: 97% 95% 96% 97%  Weight:      Height:        Eyes: PER, lids and conjunctivae normal ENMT: Mucous membranes are moist. Posterior pharynx clear of any exudate or lesions..  Neck: normal, supple, no masses Respiratory: clear to auscultation bilaterally, no wheezing, no crackles. Normal respiratory effort. No accessory muscle use.  Cardiovascular: Regular rate and rhythm, no murmurs / rubs / gallops. No extremity edema. 2+ dorsal pedis pulses.   Abdomen: no tenderness, no masses palpated. No hepatomegaly. Bowel sounds positive.  Musculoskeletal: no clubbing / cyanosis. No joint deformity upper and lower extremities. Good ROM, no contractures. Normal muscle tone.  Skin: no rashes, lesions, ulcers.  Neurologic: CN 2-12 grossly intact. Sensation intact, Strength 5/5 in all 4.  Psychiatric: Normal judgment and insight. Alert and oriented x 3. Normal mood.   Labs on Admission: I have personally reviewed following labs and imaging studies   Urine analysis:    Component Value Date/Time   COLORURINE YELLOW 12/16/2015 1136   APPEARANCEUR CLEAR 12/16/2015 1136   LABSPEC 1.024 12/16/2015 1136   PHURINE 5.5 12/16/2015 1136   GLUCOSEU >1000 (A) 12/16/2015 1136   HGBUR NEGATIVE 12/16/2015 1136   BILIRUBINUR NEGATIVE 12/16/2015 1136   KETONESUR 15 (A) 12/16/2015 1136   PROTEINUR NEGATIVE 12/16/2015 1136   NITRITE NEGATIVE 12/16/2015 1136   LEUKOCYTESUR NEGATIVE 12/16/2015 1136    Radiological Exams on Admission: Dg Chest 2 View  Result Date: 12/16/2015 CLINICAL DATA:  Cough, fever and chills for 5 days. EXAM: CHEST  2 VIEW COMPARISON:  12/23/2008 FINDINGS: The cardiac silhouette, mediastinal and hilar contours are within normal limits and stable. There is mild tortuosity and calcification of the thoracic aorta. No infiltrates, edema or effusions. Minimal apical scarring changes. The bony thorax is intact. IMPRESSION: No acute cardiopulmonary findings. Aortic atherosclerosis. Electronically Signed   By: Rudie Meyer M.D.   On: 12/16/2015  10:34    EKG: Independently reviewed.   EKG Interpretation  Date/Time:  Tuesday December 16 2015 09:22:26 EDT Ventricular Rate:  96 PR Interval:    QRS Duration: 157 QT Interval:  376 QTC Calculation: 476 R Axis:   90 Text Interpretation:  Sinus rhythm RBBB and LPFB Nonspecific ST abnormality No previous tracing Confirmed by Denton Lank  MD, Caryn Bee ([id number]) on 12/16/2015 9:26:35 AM      Assessment/Plan   Active Problems:   PAD (peripheral artery disease) (HCC)   Diabetes mellitus, type 2 (HCC)   Sepsis (HCC)   Hyperlipidemia   Hypertension      SIRS with possible  sepsis ( fever, tachycardia, leukocytosis, elevated lactic acid, AKI ).Main symptoms include body aches and diarrhea. Girlfriend reports dry hacking cough for a few days.  -place in OBS - medical bed.  -Has received 2 liter fluid bolus, will continue with maintenance at 75 ml /hr.  .CXR negative, cough non-productive which makes pulmonary source of infection less likely but will get respiratory panel.       -atypical lymphocytes on diff, ? EBV. Obtain EBV IgG, IgA -CK level, trend lactic acid -follow up on urine, blood cultures and GI pathogen panel -received antibiotics in ED for CAP. Patient non-toxic appearing. Will hold off on additional antibiotics until source can be figured out.    Diabetes mellitus type 2  -  A1c -  Hold oral medications -  SSI  Hypertension. Stable.  -Continue home cozaar. -hold hydrodiuril and lasix today, rehydrating patient. Will resume tomorrow..   PAD, s/p stenting of iliac artery.  -continue plavix   Hyperlipidemia.  Continue home statin.   DVT prophylaxis:   Lovenox   Code Status:     Full code  Family Communication:    Discussed with girlfriend in the room. Family not present. .  Disposition Plan:   Discharge home in 24-48 hours              Consults called:  None  Admission status:   Observation - Medical  Willette Cluster NP [facility] Pager [phone]  If  7PM-7AM, please contact night-coverage [email or web address] Password [email or web address]  12/16/2015, 1:46 PM

## 2015-12-16 NOTE — ED Notes (Signed)
Gave pt [place] sandwich and diet [place]

## 2015-12-17 ENCOUNTER — Observation Stay (HOSPITAL_COMMUNITY): Payer: Medicare Other

## 2015-12-17 DIAGNOSIS — R197 Diarrhea, unspecified: Secondary | ICD-10-CM | POA: Diagnosis not present

## 2015-12-17 DIAGNOSIS — R509 Fever, unspecified: Secondary | ICD-10-CM | POA: Diagnosis not present

## 2015-12-17 DIAGNOSIS — E785 Hyperlipidemia, unspecified: Secondary | ICD-10-CM | POA: Diagnosis not present

## 2015-12-17 DIAGNOSIS — J9811 Atelectasis: Secondary | ICD-10-CM | POA: Diagnosis not present

## 2015-12-17 DIAGNOSIS — I10 Essential (primary) hypertension: Secondary | ICD-10-CM | POA: Diagnosis not present

## 2015-12-17 LAB — BASIC METABOLIC PANEL
ANION GAP: 15 (ref 5–15)
BUN: 11 mg/dL (ref 6–20)
CALCIUM: 8 mg/dL — AB (ref 8.9–10.3)
CO2: 24 mmol/L (ref 22–32)
Chloride: 97 mmol/L — ABNORMAL LOW (ref 101–111)
Creatinine, Ser: 0.77 mg/dL (ref 0.61–1.24)
GFR calc Af Amer: >60 mL/min (ref >60)
GLUCOSE: 181 mg/dL — AB (ref 65–99)
Potassium: 3.1 mmol/L — ABNORMAL LOW (ref 3.5–5.1)
Sodium: 136 mmol/L (ref 135–145)

## 2015-12-17 LAB — RESPIRATORY PANEL BY PCR
Adenovirus: NOT DETECTED
Bordetella pertussis: NOT DETECTED
CORONAVIRUS OC43-RVPPCR: NOT DETECTED
Chlamydophila pneumoniae: NOT DETECTED
Coronavirus 229E: NOT DETECTED
Coronavirus HKU1: NOT DETECTED
Coronavirus NL63: NOT DETECTED
INFLUENZA A-RVPPCR: NOT DETECTED
INFLUENZA B-RVPPCR: NOT DETECTED
MYCOPLASMA PNEUMONIAE-RVPPCR: NOT DETECTED
Metapneumovirus: NOT DETECTED
PARAINFLUENZA VIRUS 1-RVPPCR: NOT DETECTED
PARAINFLUENZA VIRUS 4-RVPPCR: NOT DETECTED
Parainfluenza Virus 2: NOT DETECTED
Parainfluenza Virus 3: NOT DETECTED
RESPIRATORY SYNCYTIAL VIRUS-RVPPCR: NOT DETECTED
Rhinovirus / Enterovirus: NOT DETECTED

## 2015-12-17 LAB — URINE CULTURE: Culture: NO GROWTH

## 2015-12-17 LAB — CBC
HCT: 30.1 % — ABNORMAL LOW (ref 39.0–52.0)
HEMOGLOBIN: 10.1 g/dL — AB (ref 13.0–17.0)
MCH: 30.1 pg (ref 26.0–34.0)
MCHC: 33.6 g/dL (ref 30.0–36.0)
MCV: 89.6 fL (ref 78.0–100.0)
Platelets: 138 10*3/uL — ABNORMAL LOW (ref 150–400)
RBC: 3.36 MIL/uL — ABNORMAL LOW (ref 4.22–5.81)
RDW: 14.8 % (ref 11.5–15.5)
WBC: 6.1 10*3/uL (ref 4.0–10.5)

## 2015-12-17 LAB — GLUCOSE, CAPILLARY
GLUCOSE-CAPILLARY: 207 mg/dL — AB (ref 65–99)
Glucose-Capillary: 279 mg/dL — ABNORMAL HIGH (ref 65–99)

## 2015-12-17 MED ORDER — POTASSIUM CHLORIDE CRYS ER 20 MEQ PO TBCR
40.0000 meq | EXTENDED_RELEASE_TABLET | Freq: Once | ORAL | Status: AC
  Administered 2015-12-17: 40 meq via ORAL
  Filled 2015-12-17: qty 2

## 2015-12-17 MED ORDER — AZITHROMYCIN 500 MG PO TABS: 500.0000 mg | ORAL_TABLET | Freq: Every day | ORAL | 0 refills | Status: DC

## 2015-12-17 NOTE — Discharge Summary (Signed)
Physician Discharge Summary  Randy Jacobson GNF:[id number] DOB: [birth year] DOA: 12/16/2015  PCP: Pcp Not In System  Admit date: 12/16/2015 Discharge date: 12/17/2015  Time spent: 45 minutes  Recommendations for Outpatient Follow-up:  1. PCP in 1 week, please monitor for edema, HCTZ stopped and lasix continued, titrate up lasix dose as needed   Discharge Diagnoses:    Gastroenteritis   Campylobacter   PAD (peripheral artery disease) (HCC)   Diabetes mellitus, type 2 (HCC)   Sepsis (HCC)   Hyperlipidemia   Hypertension   Diarrhea   Febrile illness   Enteric campylobacteriosis   Discharge Condition:stable  Diet recommendation: low sodium  Filed Weights   12/16/15 0918 12/16/15 1703  Weight: 79.4 kg (175 lb) 81 kg (178 lb 9.6 oz)    History of present illness:  Randy JoinerJohn T Goeken is a 70 y.o. male with a medical history significant for DM2, HTN and PAD. Patient enjoys an active lifestyle. 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 Around Thursday of last week patient ate some unwashed apples purchased on the way to [place]. Friday he developed nonbloody diarrhea having up to 6 loose watery bowel movements a day. No associated nausea, vomiting or abdominal pain  Hospital Course:  Enteritis due to campylobacter jejuni -improved with supportive care -PO azithromycin for 3days added to his regimen -much improved, no further diarrhea, abd pain, nausea or vomiting at this time   Diabetes mellitus type 2  -  stable, resumed home meds  Hypertension. Stable.  -Continue home cozaar. -stopped hydrodiuril and lasix resumed at discharge -consider increasing lasix dose if develops edema at FU   PAD, s/p stenting of iliac artery.  -continue plavix   Hyperlipidemia.  Continue home statin.   Discharge Exam: Vitals:   12/16/15 2046 12/17/15 0534  BP: (!) 150/65 (!) 123/45  Pulse: 93 76  Resp: 18 18  Temp: 98.2 F (36.8 C) 97.8 F (36.6 C)    General: AAOx3 Cardiovascular:  S1S2/RRR Respiratory: CTAB  Discharge Instructions   Discharge Instructions    Diet - low sodium heart healthy    Complete by:  As directed   Increase activity slowly    Complete by:  As directed     Discharge Medication List as of 12/17/2015  2:09 PM    START taking these medications   Details  azithromycin (ZITHROMAX) 500 MG tablet Take 1 tablet (500 mg total) by mouth daily. For 2days, Starting Wed 12/17/2015, Print      CONTINUE these medications which have NOT CHANGED   Details  acetaminophen (TYLENOL) 325 MG tablet Take 650 mg by mouth every 6 (six) hours as needed for mild pain., Historical Med    amitriptyline (ELAVIL) 10 MG tablet Take 10 mg by mouth daily., Starting Thu 11/20/2015, Historical Med    aspirin EC 81 MG tablet Take 81 mg by mouth daily., Until Discontinued, Historical Med    clopidogrel (PLAVIX) 75 MG tablet Take 75 mg by mouth daily., Starting Thu 10/30/2015, Historical Med    furosemide (LASIX) 40 MG tablet Take 40 mg by mouth daily., Until Discontinued, Historical Med    glimepiride (AMARYL) 4 MG tablet Take 4 mg by mouth daily., Starting Mon 09/08/2015, Historical Med    KRILL OIL PO Take 1 capsule by mouth daily., Until Discontinued, Historical Med    losartan (COZAAR) 100 MG tablet Take 100 mg by mouth daily., Until Discontinued, Historical Med    metFORMIN (GLUCOPHAGE) 1000 MG tablet Take 1,000 mg by mouth  2 (two) times daily with a meal., Until Discontinued, Historical Med    Multiple Vitamin (MULTIVITAMIN WITH MINERALS) TABS tablet Take 1 tablet by mouth daily., Until Discontinued, Historical Med    simvastatin (ZOCOR) 40 MG tablet Take 40 mg by mouth daily., Until Discontinued, Historical Med      STOP taking these medications     hydrochlorothiazide (HYDRODIURIL) 25 MG tablet        No Known Allergies Follow-up Information    Dr.Green. Schedule an appointment as soon as possible for a visit in 1 week(s).   Why:  monitor for leg  swelling           The results of significant diagnostics from this hospitalization (including imaging, microbiology, ancillary and laboratory) are listed below for reference.    Significant Diagnostic Studies: Dg Chest 2 View  Result Date: 12/16/2015 CLINICAL DATA:  Cough, fever and chills for 5 days. EXAM: CHEST  2 VIEW COMPARISON:  12/23/2008 FINDINGS: The cardiac silhouette, mediastinal and hilar contours are within normal limits and stable. There is mild tortuosity and calcification of the thoracic aorta. No infiltrates, edema or effusions. Minimal apical scarring changes. The bony thorax is intact. IMPRESSION: No acute cardiopulmonary findings. Aortic atherosclerosis. Electronically Signed   By: Rudie Meyer M.D.   On: 12/16/2015 10:34   Dg Chest Port 1 View  Result Date: 12/17/2015 CLINICAL DATA:  Fever. EXAM: PORTABLE CHEST 1 VIEW COMPARISON:  12/16/2015. FINDINGS: Mediastinum hilar structures normal. Mild right base subsegmental atelectasis and or infiltrate cannot be excluded. No pleural effusion or pneumothorax. Borderline cardiomegaly. No pulmonary venous congestion . IMPRESSION: 1. Mild right base subsegmental atelectasis and or infiltrate cannot be excluded. 2. Borderline cardiomegaly.  No pulmonary venous congestion . Electronically Signed   By: Maisie Fus  Register   On: 12/17/2015 07:32    Microbiology: Recent Results (from the past 240 hour(s))  Blood Culture (routine x 2)     Status: None (Preliminary result)   Collection Time: 12/16/15 10:32 AM  Result Value Ref Range Status   Specimen Description BLOOD LEFT ANTECUBITAL  Final   Special Requests BOTTLES DRAWN AEROBIC AND ANAEROBIC 5CC  Final   Culture NO GROWTH 1 DAY  Final   Report Status PENDING  Incomplete  Blood Culture (routine x 2)     Status: None (Preliminary result)   Collection Time: 12/16/15 10:40 AM  Result Value Ref Range Status   Specimen Description BLOOD RIGHT ANTECUBITAL  Final   Special Requests  BOTTLES DRAWN AEROBIC AND ANAEROBIC 5CC  Final   Culture NO GROWTH 1 DAY  Final   Report Status PENDING  Incomplete  Urine culture     Status: None   Collection Time: 12/16/15 11:36 AM  Result Value Ref Range Status   Specimen Description URINE, RANDOM  Final   Special Requests NONE  Final   Culture NO GROWTH  Final   Report Status 12/17/2015 FINAL  Final  Gastrointestinal Panel by PCR , Stool     Status: Abnormal   Collection Time: 12/16/15 12:05 PM  Result Value Ref Range Status   Campylobacter species DETECTED (A) NOT DETECTED Final    Comment: CRITICAL RESULT CALLED TO, READ BACK BY AND VERIFIED WITH: CINDY HANCOCK AT 1655 12/16/15 SDR    Plesimonas shigelloides NOT DETECTED NOT DETECTED Final   Salmonella species NOT DETECTED NOT DETECTED Final   Yersinia enterocolitica NOT DETECTED NOT DETECTED Final   Vibrio species NOT DETECTED NOT DETECTED Final   Vibrio cholerae NOT  DETECTED NOT DETECTED Final   Enteroaggregative E coli (EAEC) NOT DETECTED NOT DETECTED Final   Enteropathogenic E coli (EPEC) DETECTED (A) NOT DETECTED Final    Comment: CRITICAL RESULT CALLED TO, READ BACK BY AND VERIFIED WITH: CINDY HANCOCK AT 1655 12/16/15 SDR    Enterotoxigenic E coli (ETEC) NOT DETECTED NOT DETECTED Final   Shiga like toxin producing E coli (STEC) NOT DETECTED NOT DETECTED Final   E. coli O157 NOT DETECTED NOT DETECTED Final   Shigella/Enteroinvasive E coli (EIEC) NOT DETECTED NOT DETECTED Final   Cryptosporidium NOT DETECTED NOT DETECTED Final   Cyclospora cayetanensis NOT DETECTED NOT DETECTED Final   Entamoeba histolytica NOT DETECTED NOT DETECTED Final   Giardia lamblia NOT DETECTED NOT DETECTED Final   Adenovirus F40/41 NOT DETECTED NOT DETECTED Final   Astrovirus NOT DETECTED NOT DETECTED Final   Norovirus GI/GII NOT DETECTED NOT DETECTED Final   Rotavirus A NOT DETECTED NOT DETECTED Final   Sapovirus (I, II, IV, and V) NOT DETECTED NOT DETECTED Final  Respiratory Panel by PCR      Status: None   Collection Time: 12/16/15  3:34 PM  Result Value Ref Range Status   Adenovirus NOT DETECTED NOT DETECTED Final   Coronavirus 229E NOT DETECTED NOT DETECTED Final   Coronavirus HKU1 NOT DETECTED NOT DETECTED Final   Coronavirus NL63 NOT DETECTED NOT DETECTED Final   Coronavirus OC43 NOT DETECTED NOT DETECTED Final   Metapneumovirus NOT DETECTED NOT DETECTED Final   Rhinovirus / Enterovirus NOT DETECTED NOT DETECTED Final   Influenza A NOT DETECTED NOT DETECTED Final   Influenza B NOT DETECTED NOT DETECTED Final   Parainfluenza Virus 1 NOT DETECTED NOT DETECTED Final   Parainfluenza Virus 2 NOT DETECTED NOT DETECTED Final   Parainfluenza Virus 3 NOT DETECTED NOT DETECTED Final   Parainfluenza Virus 4 NOT DETECTED NOT DETECTED Final   Respiratory Syncytial Virus NOT DETECTED NOT DETECTED Final   Bordetella pertussis NOT DETECTED NOT DETECTED Final   Chlamydophila pneumoniae NOT DETECTED NOT DETECTED Final   Mycoplasma pneumoniae NOT DETECTED NOT DETECTED Final     Labs: Basic Metabolic Panel:  Recent Labs Lab 12/16/15 0943 12/17/15 0517  NA 127* 136  K 4.2 3.1*  CL 91* 97*  CO2 24 24  GLUCOSE 362* 181*  BUN 15 11  CREATININE 1.06 0.77  CALCIUM 8.7* 8.0*   Liver Function Tests:  Recent Labs Lab 12/16/15 0943  AST 25  ALT 17  ALKPHOS 68  BILITOT 1.6*  PROT 7.2  ALBUMIN 3.8    Recent Labs Lab 12/16/15 0943  LIPASE 24   No results for input(s): AMMONIA in the last 168 hours. CBC:  Recent Labs Lab 12/16/15 0943 12/17/15 0517  WBC 12.3* 6.1  NEUTROABS 9.3*  --   HGB 11.2* 10.1*  HCT 33.7* 30.1*  MCV 90.3 89.6  PLT 155 138*   Cardiac Enzymes:  Recent Labs Lab 12/16/15 1627  CKTOTAL 52   BNP: BNP (last 3 results) No results for input(s): BNP in the last 8760 hours.  ProBNP (last 3 results) No results for input(s): PROBNP in the last 8760 hours.  CBG:  Recent Labs Lab 12/16/15 2148 12/17/15 0820 12/17/15 1154  GLUCAP 323*  207* 279*       SignedZannie Cove MD.  [facility] 12/17/2015, 5:00 PM

## 2015-12-17 NOTE — Progress Notes (Signed)
Randy Jacobson to be D/C'd to home per MD order.  Discussed with the patient and all questions fully answered.  VSS, Skin clean, dry and intact without evidence of skin break down, no evidence of skin tears noted. IV catheter discontinued intact. Site without signs and symptoms of complications. Dressing and pressure applied.  An After Visit Summary was printed and given to the patient. Patient received prescription.  D/c education completed with patient/family including follow up instructions, medication list, d/c activities limitations if indicated, with other d/c instructions as indicated by MD - patient able to verbalize understanding, all questions fully answered.   Patient instructed to return to ED, call 911, or call MD for any changes in condition.   Patient escorted via WC, and D/C home via private auto.  Randy Jacobson 12/17/2015 3:59 PM

## 2015-12-21 LAB — CULTURE, BLOOD (ROUTINE X 2)
CULTURE: NO GROWTH
Culture: NO GROWTH

## 2015-12-23 ENCOUNTER — Telehealth: Payer: Self-pay

## 2015-12-23 DIAGNOSIS — A045 Campylobacter enteritis: Secondary | ICD-10-CM | POA: Diagnosis not present

## 2015-12-23 DIAGNOSIS — Z23 Encounter for immunization: Secondary | ICD-10-CM | POA: Diagnosis not present

## 2015-12-23 DIAGNOSIS — I959 Hypotension, unspecified: Secondary | ICD-10-CM | POA: Diagnosis not present

## 2015-12-23 NOTE — Telephone Encounter (Signed)
LMOVM letting patient know he is to take his Plavix 75mg  PO daily and that I phoned a 90 day prescription to his [facility] in [place], [phone]). He had stopped by our office this morning stating he gets his medications filled every 90 days, so he was picking up only a 30 day supply of Plavix every three months.  Therefore, he has been taking Plavix for one month, then not taking it for two months.  Donell SievertJeanne Tahiri Shareef, RN

## 2016-01-13 ENCOUNTER — Encounter: Payer: Self-pay | Admitting: Interventional Radiology

## 2016-04-02 DIAGNOSIS — I10 Essential (primary) hypertension: Secondary | ICD-10-CM | POA: Diagnosis not present

## 2016-04-02 DIAGNOSIS — E1165 Type 2 diabetes mellitus with hyperglycemia: Secondary | ICD-10-CM | POA: Diagnosis not present

## 2016-04-07 ENCOUNTER — Other Ambulatory Visit (HOSPITAL_COMMUNITY): Payer: Self-pay | Admitting: Interventional Radiology

## 2016-04-07 DIAGNOSIS — I739 Peripheral vascular disease, unspecified: Secondary | ICD-10-CM

## 2016-04-21 ENCOUNTER — Ambulatory Visit
Admission: RE | Admit: 2016-04-21 | Discharge: 2016-04-21 | Disposition: A | Discharge status: Home / Self Care | Payer: Medicare Other | Source: Ambulatory Visit | Attending: Interventional Radiology | Admitting: Interventional Radiology

## 2016-04-21 ENCOUNTER — Encounter: Payer: Self-pay | Admitting: Radiology

## 2016-04-21 DIAGNOSIS — I739 Peripheral vascular disease, unspecified: Secondary | ICD-10-CM

## 2016-04-21 DIAGNOSIS — I70211 Atherosclerosis of native arteries of extremities with intermittent claudication, right leg: Secondary | ICD-10-CM | POA: Diagnosis not present

## 2016-04-21 HISTORY — PX: IR GENERIC HISTORICAL: IMG1180011

## 2016-04-21 NOTE — Progress Notes (Signed)
Chief Complaint: Patient was seen in consultation today for  Chief Complaint  Patient presents with  . Follow-up    Right Femoral popliteal Disease/claudication   at the request of Morgen Ritacco  Referring Physician(s): Camaya Gannett  History of Present Illness: Randy Jacobson is a 71 y.o. male who initially presented in August 2016 with symptoms of right lower extremity [place] category 2 claudication. He is an extremely active gentleman often working from just after sunrise until sunset. 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    A CT arteriogram demonstrated a high-grade heavily calcified lesion in the proximal right common iliac artery as well as fairly extensive disease throughout the superficial femoral artery.  His initial resting ankle brachial index was 0.7 which degraded to 0.1 following exercise consistent with advanced peripheral arterial disease. Given the fact that his claudication symptoms were relatively moderate and he was still able to walk up to a mile we decided to proceed with correction of his inflow disease first before deciding if his outflow disease needed to be addressed.  He underwent placement of bilateral kissing iliac artery stents on 12/27/2014. On his initial follow-up evaluation in November 2016 his right ankle brachial index had normalized both at rest and following exercise. He was experiencing significantly less claudication and able to walk as much as he liked during the day. Continued ultrasound surveillance was performed in May 2017.  At that time,  at that time, his right ankle brachial index began to deteriorate at 0.8 at rest and 0.74 following exercise.  The arterial waveforms and pulse volume recordings suggested continued patency of the iliac stent put progression of his femoral popliteal disease. He was having very minimal calf claudication symptoms at that time. Therefore, we elected to continue observation with a plan to see him  again in one year in May 2018.  Unfortunately, over this past year he has developed continued progression of his right lower extremity claudication. His claudication symptoms in his right calf are now as bad are slightly worse than they were before his initial intervention. He continues to walk and work every day and has just been pushing through the pain but feels very frustrated that he has recurrent symptoms already.  His left lower extremity has also demonstrated some mild changes. He now has minimal left calf claudication. However, this does not interfere with his daily activity and is minimal compared to his right lower extremity symptoms.  He continues to take his Plavix daily. He is not smoking.  Past Medical History:  Diagnosis Date  . Diabetes mellitus without complication (HCC)   . Hypertension     Past Surgical History:  Procedure Laterality Date  . "stents in both my legs"    . IR GENERIC HISTORICAL  08/26/2015   IR RADIOLOGIST EVAL & MGMT 08/26/2015 Malachy MoanHeath Stefania Goulart, MD [facility] INTERV RAD  . IR GENERIC HISTORICAL  04/21/2016   IR RADIOLOGIST EVAL & MGMT 04/21/2016 Malachy MoanHeath Lieutenant Abarca, MD [facility] INTERV RAD    Allergies: Patient has no known allergies.  Medications: Prior to Admission medications   Medication Sig Start Date End Date Taking? Authorizing Provider  acetaminophen (TYLENOL) 325 MG tablet Take 650 mg by mouth every 6 (six) hours as needed for mild pain.   Yes Historical Provider, MD  amitriptyline (ELAVIL) 10 MG tablet Take 10 mg by mouth daily. 11/20/15  Yes Historical Provider, MD  aspirin EC 81 MG tablet Take 81 mg by mouth daily.   Yes  Historical Provider, MD  clopidogrel (PLAVIX) 75 MG tablet Take 75 mg by mouth daily. 10/30/15  Yes Historical Provider, MD  furosemide (LASIX) 40 MG tablet Take 40 mg by mouth daily.   Yes Historical Provider, MD  glimepiride (AMARYL) 4 MG tablet Take 4 mg by mouth daily. 09/08/15  Yes Historical Provider, MD  KRILL OIL PO Take 1  capsule by mouth daily.   Yes Historical Provider, MD  losartan (COZAAR) 100 MG tablet Take 100 mg by mouth daily.   Yes Historical Provider, MD  metFORMIN (GLUCOPHAGE) 1000 MG tablet Take 1,000 mg by mouth 2 (two) times daily with a meal.   Yes Historical Provider, MD  Multiple Vitamin (MULTIVITAMIN WITH MINERALS) TABS tablet Take 1 tablet by mouth daily.   Yes Historical Provider, MD  simvastatin (ZOCOR) 40 MG tablet Take 40 mg by mouth daily.   Yes Historical Provider, MD  azithromycin (ZITHROMAX) 500 MG tablet Take 1 tablet (500 mg total) by mouth daily. For 2days Patient not taking: Reported on 04/21/2016 12/17/15   Zannie Cove, MD     Family History  Problem Relation Age of Onset  . Diabetes Brother     Social History   Social History  . Marital status: Single    Spouse name: N/A  . Number of children: N/A  . Years of education: N/A   Social History Main Topics  . Smoking status: Former Smoker    Packs/day: 1.00    Types: Cigarettes    Start date: 11/20/1957    Quit date: 11/20/1993  . Smokeless tobacco: Never Used  . Alcohol use 0.0 oz/week     Comment: Beer 12 pack/week     . Drug use: No     Comment: Hx of marijuana use     . Sexual activity: Not on file   Other Topics Concern  . Not on file   Social History Narrative  . No narrative on file    Review of Systems: A 12 point ROS discussed and pertinent positives are indicated in the HPI above.  All other systems are negative.  Review of Systems  Vital Signs: BP (!) 151/65 (BP Location: Left Arm, Patient Position: Sitting, Cuff Size: Normal)   Pulse 79   Temp 97.7 F (36.5 C) (Oral)   Resp 14   Ht 5\' 11"  (1.803 m)   Wt 178 lb (80.7 kg)   SpO2 98%   BMI 24.83 kg/m   Physical Exam  Constitutional: He is oriented to person, place, and time. He appears well-developed and well-nourished. No distress.  HENT:  Head: Normocephalic and atraumatic.  Eyes: Left eye exhibits no discharge.  Cardiovascular:  Normal rate and regular rhythm.   Pulses:      Femoral pulses are 2+ on the right side, and 2+ on the left side.      Popliteal pulses are 0 on the right side, and 1+ on the left side.  Pulmonary/Chest: Effort normal and breath sounds normal.  Abdominal: Soft. He exhibits no distension. There is no tenderness.  Neurological: He is alert and oriented to person, place, and time.  Skin: Skin is warm and dry.  Psychiatric: He has a normal mood and affect. His behavior is normal.  Nursing note and vitals reviewed.    Imaging: [place] Low Ext Art Bil W/exercise  Result Date: 04/21/2016 CLINICAL DATA:  71 year old male with recurrent right lower extremity claudication EXAM: NONINVASIVE PHYSIOLOGIC VASCULAR STUDY OF BILATERAL LOWER EXTREMITIES WITH AND WITHOUT EXERCISE TECHNIQUE: Evaluation  of both lower extremities were performed at rest, including calculation of ankle-brachial indices, multiple segmental pressure evaluation, segmental Doppler and segmental pulse volume recording. Ankle brachial indices were also obtained following treadmill exercise. COMPARISON:  Prior duplex ultrasound 08/26/2015 FINDINGS: RESTING Right ABI: 0.64 (previously 0.82) Left ABI: 0.91 (previously 1.1) Right lower extremity: Normal triphasic arterial waveform and preserved pulse volume recording at the femoral station suggesting patency of the previously placed iliac stent. Abnormal monophasic arterial waveforms and diminished pulse volume recordings at the popliteal and ankle. Left lower extremity: Triphasic waveforms through the popliteal artery. Monophasic waveforms in the posterior tibial and dorsalis pedis arteries. POST EXERCISE Right ABI: 0.29 (previously 0.74) Left ABI: 0.80 (previously 1.1) IMPRESSION: 1. Interval progression of bilateral right worse than left lower extremity disease. 2. Post exercise right ankle brachial index of 0.29 consistent with severe peripheral arterial disease, predominantly in the femoropopliteal  and runoff distribution. This represents a progression of disease compared to prior studies. 3. More modest progression of left peripheral arterial disease which is now mild -moderate in severity. On the left, the disease appears to be predominantly in the runoff distribution. Signed, Sterling Big, MD [facility] Electronically Signed   By: Malachy Moan M.D.   On: 04/21/2016 16:54   Ir Radiologist Eval & Mgmt  Result Date: 04/21/2016 Please refer to "Notes" to see consult details.   Labs:  CBC:  Recent Labs  12/16/15 0943 12/17/15 0517  WBC 12.3* 6.1  HGB 11.2* 10.1*  HCT 33.7* 30.1*  PLT 155 138*    COAGS: No results for input(s): INR, APTT in the last 8760 hours.  BMP:  Recent Labs  12/16/15 0943 12/17/15 0517  NA 127* 136  K 4.2 3.1*  CL 91* 97*  CO2 24 24  GLUCOSE 362* 181*  BUN 15 11  CALCIUM 8.7* 8.0*  CREATININE 1.06 0.77  GFRNONAA >60 >60  GFRAA >60 >60    LIVER FUNCTION TESTS:  Recent Labs  12/16/15 0943  BILITOT 1.6*  AST 25  ALT 17  ALKPHOS 68  PROT 7.2  ALBUMIN 3.8    TUMOR MARKERS: No results for input(s): AFPTM, CEA, CA199, CHROMGRNA in the last 8760 hours.  Assessment and Plan:  71 year old male with recurrent right lower extremity claudication. He had known femoral popliteal disease when we initially treated his inflow disease. He would like to pursue treatment for his recurrent symptoms, however he wants the most optimal and durable therapy available.  He specifically does not desire to keep returning for additional procedures.  We discussed that a femoral popliteal bypass with autologous vein offers the best long-term patency and least chance of a repeat intervention. If he does not have a suitable vein conduit, then femoropopliteal bypass graft and endovascular intervention including atherectomy, drug-coated balloon angioplasty and possible stent placement offer  similar patency and rate of re-intervention.  He is interested in pursuing a venous bypass at this time. I will refer him to one of our excellent vascular surgery colleagues.  1.) Refer to Durene Cal, M.D. of [facility] for evaluation of bypass candidacy. 2.) Continue Plavix.   Electronically Signed: Malachy Moan 04/21/2016, 5:20 PM   I spent a total of 15 Minutes in face to face in clinical consultation, greater than 50% of which was counseling/coordinating care for right lower extremity claudication.

## 2016-04-29 ENCOUNTER — Other Ambulatory Visit: Payer: Self-pay | Admitting: *Deleted

## 2016-04-29 ENCOUNTER — Encounter: Payer: Self-pay | Admitting: Surgery

## 2016-04-29 DIAGNOSIS — M79604 Pain in right leg: Secondary | ICD-10-CM

## 2016-05-05 ENCOUNTER — Ambulatory Visit (INDEPENDENT_AMBULATORY_CARE_PROVIDER_SITE_OTHER): Payer: Medicare Other | Admitting: Surgery

## 2016-05-05 ENCOUNTER — Encounter: Payer: Self-pay | Admitting: Surgery

## 2016-05-05 ENCOUNTER — Ambulatory Visit (HOSPITAL_COMMUNITY)
Admission: RE | Admit: 2016-05-05 | Discharge: 2016-05-05 | Disposition: A | Discharge status: Home / Self Care | Payer: Medicare Other | Source: Ambulatory Visit | Attending: Surgery | Admitting: Surgery

## 2016-05-05 VITALS — BP 131/78 | HR 83 | Temp 97.2°F | Resp 16 | Ht 71.0 in | Wt 187.0 lb

## 2016-05-05 DIAGNOSIS — I7092 Chronic total occlusion of artery of the extremities: Secondary | ICD-10-CM | POA: Diagnosis not present

## 2016-05-05 DIAGNOSIS — I70213 Atherosclerosis of native arteries of extremities with intermittent claudication, bilateral legs: Secondary | ICD-10-CM | POA: Diagnosis not present

## 2016-05-05 DIAGNOSIS — M79604 Pain in right leg: Secondary | ICD-10-CM

## 2016-05-05 LAB — VAS US LOWER EXTREMITY ARTERIAL DUPLEX
RPERPSV: 64 cm/s
RSFDPSV: -498 cm/s
Right popliteal dist sys PSV: -34 cm/s
Right popliteal prox sys PSV: 28 cm/s
Right super femoral mid sys PSV: 89 cm/s
Right super femoral prox sys PSV: 121 cm/s

## 2016-05-05 MED ORDER — CILOSTAZOL 100 MG PO TABS: 100.0000 mg | ORAL_TABLET | Freq: Two times a day (BID) | ORAL | 11 refills | Status: DC

## 2016-05-05 NOTE — Progress Notes (Signed)
[facility]  Patient name: Randy Jacobson MRN: [id number] DOB: [birth year] Sex: male   REFERRING PROVIDER:    Dr. Archer Asa   REASON FOR CONSULT:    Claudication, bilateral  HISTORY OF PRESENT ILLNESS:   Randy Jacobson is a 71 y.o. male, who is Her for today for evaluation of bilateral claudication.  The patient states that he has been in excellent health up until this past year where he began having symptoms of leg pain.  This initially began 1 PROXIMAL me a mild, however now is progressively deteriorated.  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  He states that initially the left leg was worse, but now both are equal.  His symptoms occur at approximately 100 yards.  He does not have any open wounds or rest pain.  Rest alleviates his symptoms.  The patient does suffer from diabetes which is controlled.  He is on a statin for hypercholesterolemia.  He is a former smoker, having quit 20 years ago.  PAST MEDICAL HISTORY    Past Medical History:  Diagnosis Date  . Diabetes mellitus without complication (HCC)   . Hypertension      FAMILY HISTORY   Family History  Problem Relation Age of Onset  . Diabetes Brother     SOCIAL HISTORY:   Social History   Social History  . Marital status: Single    Spouse name: N/A  . Number of children: N/A  . Years of education: N/A   Occupational History  . Not on file.   Social History Main Topics  . Smoking status: Former Smoker    Packs/day: 1.00    Types: Cigarettes    Start date: 11/20/1957    Quit date: 11/20/1993  . Smokeless tobacco: Never Used  . Alcohol use 0.0 oz/week     Comment: Beer 12 pack/week     . Drug use: No     Comment: Hx of marijuana use     . Sexual activity: Not on file   Other Topics Concern  . Not on file   Social History Narrative  . No narrative on file    ALLERGIES:    No Known Allergies  CURRENT  MEDICATIONS:    Current Outpatient Prescriptions  Medication Sig Dispense Refill  . acetaminophen (TYLENOL) 325 MG tablet Take 650 mg by mouth every 6 (six) hours as needed for mild pain.    Marland Kitchen amitriptyline (ELAVIL) 10 MG tablet Take 10 mg by mouth daily.    Marland Kitchen aspirin EC 81 MG tablet Take 81 mg by mouth daily.    Marland Kitchen azithromycin (ZITHROMAX) 500 MG tablet Take 1 tablet (500 mg total) by mouth daily. For 2days 2 tablet 0  . clopidogrel (PLAVIX) 75 MG tablet Take 75 mg by mouth daily.    . furosemide (LASIX) 40 MG tablet Take 40 mg by mouth daily.    Marland Kitchen glimepiride (AMARYL) 4 MG tablet Take 4 mg by mouth daily.    Marland Kitchen KRILL OIL PO Take 1 capsule by mouth daily.    Marland Kitchen losartan (COZAAR) 100 MG tablet Take 100 mg by mouth daily.    . metFORMIN (GLUCOPHAGE) 1000 MG tablet Take 1,000 mg by mouth 2 (two) times daily with a meal.    . Multiple Vitamin (MULTIVITAMIN WITH MINERALS) TABS tablet Take 1 tablet by mouth daily.    . simvastatin (ZOCOR) 40 MG tablet Take 40 mg by mouth daily.  No current facility-administered medications for this visit.     REVIEW OF SYSTEMS:   [X]  denotes positive finding, [ ]  denotes negative finding Cardiac  Comments:  Chest pain or chest pressure:    Shortness of breath upon exertion:    Short of breath when lying flat:    Irregular heart rhythm:        Vascular    Pain in calf, thigh, or hip brought on by ambulation: x   Pain in feet at night that wakes you up from your sleep:     Blood clot in your veins:    Leg swelling:         Pulmonary    Oxygen at home:    Productive cough:     Wheezing:         Neurologic    Sudden weakness in arms or legs:  x   Sudden numbness in arms or legs:  x   Sudden onset of difficulty speaking or slurred speech:    Temporary loss of vision in one eye:     Problems with dizziness:         Gastrointestinal    Blood in stool:      Vomited blood:         Genitourinary    Burning when urinating:     Blood in urine:         Psychiatric    Major depression:         Hematologic    Bleeding problems:    Problems with blood clotting too easily:        Skin    Rashes or ulcers:        Constitutional    Fever or chills:     PHYSICAL EXAM:   Vitals:   05/05/16 1357  BP: 131/78  Pulse: 83  Resp: 16  Temp: 97.2 F (36.2 C)  TempSrc: Oral  SpO2: 96%  Weight: 187 lb (84.8 kg)  Height: 5\' 11"  (1.803 m)    GENERAL: The patient is a well-nourished male, in no acute distress. The vital signs are documented above. CARDIAC: There is a regular rate and rhythm.  VASCULAR: Palpable femoral pulses, nonpalpable pedal PULMONARY: Nonlabored respirations MUSCULOSKELETAL: There are no major deformities or cyanosis. NEUROLOGIC: No focal weakness or paresthesias are detected. SKIN: There are no ulcers or rashes noted. PSYCHIATRIC: The patient has a normal affect.  STUDIES:   CT Carlyon Prowsngie Graham 2016 which shows calcified iliac lesions.  These were stented last year.  Arterial duplex was performed in our office on the left leg.  This shows 30-49 percent right femoral stenosis and 75-99 percent mid-distal superficial femoral artery on the right.  A occlusion could not be ruled out.  ASSESSMENT and PLAN   Peripheral vascular disease: I had an extensive conversation with the patient regarding our treatment options as well as the realistic expectations of long-term durability.  We have initially agreed to attempt medical therapy.  I am giving him a trial of cilostazol to see if he gets any improvement from this.  If he does not, he will contact me within a month and we will schedule him for arteriogram  I discussed that the recovery from a bypass graft would likely last 6 weeks-3 months.  He is a little reluctant to consider this given the amount of time and will keep him out of work.  In addition, I told him I would recommend percutaneous intervention if he is a candidate so  as to preserve his bypass options.  When  the patient calls me in a month, if he has not had any relief of his symptoms, we will proceed with angiography.  I would perform aortogram with runoff initially through a left femoral approach and a retrograde fashion and if he is a candidate for intervention in the right leg, we would proceed with an antegrade stick in the right groin.   Durene Cal, MD [facility] [phone] Pager [phone]

## 2016-05-05 NOTE — Addendum Note (Signed)
Addended by: Sharee PimpleMCCHESNEY, MARILYN K on: 05/05/2016 03:43 PM   Modules accepted: Orders

## 2016-05-19 DIAGNOSIS — I10 Essential (primary) hypertension: Secondary | ICD-10-CM | POA: Diagnosis not present

## 2016-05-19 DIAGNOSIS — I739 Peripheral vascular disease, unspecified: Secondary | ICD-10-CM | POA: Diagnosis not present

## 2016-05-19 DIAGNOSIS — E1165 Type 2 diabetes mellitus with hyperglycemia: Secondary | ICD-10-CM | POA: Diagnosis not present

## 2016-05-21 DIAGNOSIS — E113393 Type 2 diabetes mellitus with moderate nonproliferative diabetic retinopathy without macular edema, bilateral: Secondary | ICD-10-CM | POA: Diagnosis not present

## 2016-06-30 ENCOUNTER — Telehealth: Payer: Self-pay | Admitting: Radiology

## 2016-06-30 NOTE — Telephone Encounter (Signed)
Left message requesting patient to call back re: decision to continue IR follow up or if he has decided to pursue Rx as recommended by Dr Myra GianottiBrabham (see Dr Estanislado SpireBrabham's Epic note dated 05/05/2016).  Eli Adami Carmell AustriaGales, RN 06/30/2016 12:22 PM

## 2016-08-10 DIAGNOSIS — I739 Peripheral vascular disease, unspecified: Secondary | ICD-10-CM | POA: Diagnosis not present

## 2016-08-10 DIAGNOSIS — E119 Type 2 diabetes mellitus without complications: Secondary | ICD-10-CM | POA: Diagnosis not present

## 2016-08-10 DIAGNOSIS — E118 Type 2 diabetes mellitus with unspecified complications: Secondary | ICD-10-CM | POA: Diagnosis not present

## 2016-08-10 DIAGNOSIS — I10 Essential (primary) hypertension: Secondary | ICD-10-CM | POA: Diagnosis not present

## 2016-11-12 ENCOUNTER — Ambulatory Visit
Admission: RE | Admit: 2016-11-12 | Discharge: 2016-11-12 | Disposition: A | Discharge status: Home / Self Care | Payer: Medicare Other | Source: Ambulatory Visit | Attending: Internal Medicine | Admitting: Internal Medicine

## 2016-11-12 ENCOUNTER — Other Ambulatory Visit: Payer: Self-pay | Admitting: Internal Medicine

## 2016-11-12 DIAGNOSIS — R059 Cough, unspecified: Secondary | ICD-10-CM

## 2016-11-12 DIAGNOSIS — R05 Cough: Secondary | ICD-10-CM

## 2017-04-08 ENCOUNTER — Observation Stay (HOSPITAL_COMMUNITY)
Admission: AD | Admit: 2017-04-08 | Discharge: 2017-04-11 | Disposition: A | Discharge status: Home / Self Care | Payer: Medicare Other | Source: Ambulatory Visit | Attending: Internal Medicine | Admitting: Internal Medicine

## 2017-04-08 ENCOUNTER — Other Ambulatory Visit: Payer: Self-pay

## 2017-04-08 DIAGNOSIS — Z87891 Personal history of nicotine dependence: Secondary | ICD-10-CM | POA: Diagnosis not present

## 2017-04-08 DIAGNOSIS — I739 Peripheral vascular disease, unspecified: Secondary | ICD-10-CM | POA: Diagnosis not present

## 2017-04-08 DIAGNOSIS — R195 Other fecal abnormalities: Secondary | ICD-10-CM | POA: Diagnosis not present

## 2017-04-08 DIAGNOSIS — Z86010 Personal history of colonic polyps: Secondary | ICD-10-CM | POA: Insufficient documentation

## 2017-04-08 DIAGNOSIS — I10 Essential (primary) hypertension: Secondary | ICD-10-CM | POA: Diagnosis present

## 2017-04-08 DIAGNOSIS — Z7982 Long term (current) use of aspirin: Secondary | ICD-10-CM | POA: Insufficient documentation

## 2017-04-08 DIAGNOSIS — E1151 Type 2 diabetes mellitus with diabetic peripheral angiopathy without gangrene: Secondary | ICD-10-CM | POA: Diagnosis not present

## 2017-04-08 DIAGNOSIS — K922 Gastrointestinal hemorrhage, unspecified: Secondary | ICD-10-CM | POA: Diagnosis present

## 2017-04-08 DIAGNOSIS — Z7902 Long term (current) use of antithrombotics/antiplatelets: Secondary | ICD-10-CM | POA: Insufficient documentation

## 2017-04-08 DIAGNOSIS — Z7984 Long term (current) use of oral hypoglycemic drugs: Secondary | ICD-10-CM | POA: Insufficient documentation

## 2017-04-08 DIAGNOSIS — E118 Type 2 diabetes mellitus with unspecified complications: Secondary | ICD-10-CM | POA: Diagnosis not present

## 2017-04-08 DIAGNOSIS — D649 Anemia, unspecified: Secondary | ICD-10-CM | POA: Diagnosis present

## 2017-04-08 DIAGNOSIS — Z79899 Other long term (current) drug therapy: Secondary | ICD-10-CM | POA: Insufficient documentation

## 2017-04-08 DIAGNOSIS — Z8711 Personal history of peptic ulcer disease: Secondary | ICD-10-CM | POA: Diagnosis not present

## 2017-04-08 DIAGNOSIS — E119 Type 2 diabetes mellitus without complications: Secondary | ICD-10-CM

## 2017-04-08 DIAGNOSIS — D509 Iron deficiency anemia, unspecified: Principal | ICD-10-CM | POA: Insufficient documentation

## 2017-04-08 DIAGNOSIS — E785 Hyperlipidemia, unspecified: Secondary | ICD-10-CM | POA: Diagnosis present

## 2017-04-08 LAB — COMPREHENSIVE METABOLIC PANEL
ALBUMIN: 4 g/dL (ref 3.5–5.0)
ALK PHOS: 64 U/L (ref 38–126)
ALT: 23 U/L (ref 17–63)
ANION GAP: 9 (ref 5–15)
AST: 23 U/L (ref 15–41)
BILIRUBIN TOTAL: 0.5 mg/dL (ref 0.3–1.2)
BUN: 25 mg/dL — ABNORMAL HIGH (ref 6–20)
CALCIUM: 9.2 mg/dL (ref 8.9–10.3)
CO2: 25 mmol/L (ref 22–32)
CREATININE: 1.11 mg/dL (ref 0.61–1.24)
Chloride: 100 mmol/L — ABNORMAL LOW (ref 101–111)
GFR calc non Af Amer: >60 mL/min (ref >60)
GLUCOSE: 230 mg/dL — AB (ref 65–99)
Potassium: 4.1 mmol/L (ref 3.5–5.1)
SODIUM: 134 mmol/L — AB (ref 135–145)
TOTAL PROTEIN: 6.8 g/dL (ref 6.5–8.1)

## 2017-04-08 LAB — TSH: TSH: 0.513 u[IU]/mL (ref 0.350–4.500)

## 2017-04-08 LAB — PREPARE RBC (CROSSMATCH)

## 2017-04-08 LAB — IRON AND TIBC
IRON: 14 ug/dL — AB (ref 45–182)
Saturation Ratios: 3 % — ABNORMAL LOW (ref 17.9–39.5)
TIBC: 449 ug/dL (ref 250–450)
UIBC: 435 ug/dL

## 2017-04-08 LAB — VITAMIN B12: Vitamin B-12: 374 pg/mL (ref 180–914)

## 2017-04-08 LAB — RETICULOCYTES
RBC.: 3.17 MIL/uL — ABNORMAL LOW (ref 4.22–5.81)
Retic Count, Absolute: 82.4 10*3/uL (ref 19.0–186.0)
Retic Ct Pct: 2.6 % (ref 0.4–3.1)

## 2017-04-08 LAB — ABO/RH: ABO/RH(D): O NEG

## 2017-04-08 LAB — GLUCOSE, CAPILLARY
GLUCOSE-CAPILLARY: 230 mg/dL — AB (ref 65–99)
Glucose-Capillary: 198 mg/dL — ABNORMAL HIGH (ref 65–99)

## 2017-04-08 LAB — FERRITIN: FERRITIN: 5 ng/mL — AB (ref 24–336)

## 2017-04-08 LAB — FOLATE: Folate: 24 ng/mL (ref >5.9)

## 2017-04-08 MED ORDER — SODIUM CHLORIDE 0.9 % IV SOLN: 250.0000 mL | INTRAVENOUS | Status: DC | PRN

## 2017-04-08 MED ORDER — ACETAMINOPHEN 650 MG RE SUPP: 650.0000 mg | Freq: Four times a day (QID) | RECTAL | Status: DC | PRN

## 2017-04-08 MED ORDER — INSULIN ASPART 100 UNIT/ML ~~LOC~~ SOLN
0.0000 [IU] | Freq: Three times a day (TID) | SUBCUTANEOUS | Status: DC
  Administered 2017-04-09: 8 [IU] via SUBCUTANEOUS
  Administered 2017-04-09: 5 [IU] via SUBCUTANEOUS
  Administered 2017-04-09: 11 [IU] via SUBCUTANEOUS
  Administered 2017-04-10: 3 [IU] via SUBCUTANEOUS
  Administered 2017-04-10: 11 [IU] via SUBCUTANEOUS
  Administered 2017-04-10: 3 [IU] via SUBCUTANEOUS
  Administered 2017-04-11 (×2): 5 [IU] via SUBCUTANEOUS

## 2017-04-08 MED ORDER — ACETAMINOPHEN 325 MG PO TABS: 650.0000 mg | ORAL_TABLET | Freq: Four times a day (QID) | ORAL | Status: DC | PRN

## 2017-04-08 MED ORDER — ONDANSETRON HCL 4 MG PO TABS: 4.0000 mg | ORAL_TABLET | Freq: Four times a day (QID) | ORAL | Status: DC | PRN

## 2017-04-08 MED ORDER — SIMVASTATIN 40 MG PO TABS
40.0000 mg | ORAL_TABLET | Freq: Every day | ORAL | Status: DC
  Administered 2017-04-09 – 2017-04-11 (×3): 40 mg via ORAL
  Filled 2017-04-08 (×3): qty 1

## 2017-04-08 MED ORDER — ONDANSETRON HCL 4 MG/2ML IJ SOLN: 4.0000 mg | Freq: Four times a day (QID) | INTRAMUSCULAR | Status: DC | PRN

## 2017-04-08 MED ORDER — AMITRIPTYLINE HCL 10 MG PO TABS
10.0000 mg | ORAL_TABLET | Freq: Every day | ORAL | Status: DC
  Administered 2017-04-09 – 2017-04-11 (×3): 10 mg via ORAL
  Filled 2017-04-08 (×3): qty 1

## 2017-04-08 MED ORDER — SODIUM CHLORIDE 0.9 % IV SOLN
Freq: Once | INTRAVENOUS | Status: AC
  Administered 2017-04-08: 22:00:00 via INTRAVENOUS

## 2017-04-08 MED ORDER — HYDRALAZINE HCL 20 MG/ML IJ SOLN: 10.0000 mg | Freq: Four times a day (QID) | INTRAMUSCULAR | Status: DC | PRN

## 2017-04-08 MED ORDER — ACETAMINOPHEN 325 MG PO TABS
650.0000 mg | ORAL_TABLET | Freq: Four times a day (QID) | ORAL | Status: DC | PRN
Start: 2017-04-08 — End: 2017-04-11

## 2017-04-08 MED ORDER — SODIUM CHLORIDE 0.9% FLUSH
3.0000 mL | Freq: Two times a day (BID) | INTRAVENOUS | Status: DC
  Administered 2017-04-08 – 2017-04-11 (×6): 3 mL via INTRAVENOUS

## 2017-04-08 MED ORDER — SODIUM CHLORIDE 0.9% FLUSH: 3.0000 mL | INTRAVENOUS | Status: DC | PRN

## 2017-04-08 MED ORDER — TRAZODONE HCL 50 MG PO TABS
50.0000 mg | ORAL_TABLET | Freq: Once | ORAL | Status: AC
  Administered 2017-04-08: 50 mg via ORAL
  Filled 2017-04-08: qty 1

## 2017-04-08 NOTE — H&P (Signed)
[facility] History and Physical  Randy Jacobson [id number] DOB: 21-Aug-1945 DOA: 04/08/2017  PCP: Randy Jacobson  Patient coming from: Home  Chief Complaint: Fatigue, dizziness  HPI: Randy JoinerJohn T Jacobson is a 71 y.o. male with a medical history of hypertension, diabetes, for full vascular disease, who presented to the hospital today for symptomatic anemia. Patient states he's been having shortness of breath, fatigue, dizziness for the past several months. He was noted to have a low hemoglobin back in September and was sent to gastroenterology office for colonoscopy and endoscopy. Today, patient presented to his PCPs office, was found to have a hemoglobin of 7.5. Patient denies any recent illness, fever, chest pain, abdominal pain, nausea or vomiting, diarrhea or constipation, change in urinary or bowel habits. Denies any hematochezia or melena.  ED Course: None. Sent from PCP's office.   Review of Systems:  All other systems reviewed and are negative.   Past Medical History:  Diagnosis Date  . Diabetes mellitus without complication (HCC)   . Hypertension     Past Surgical History:  Procedure Laterality Date  . "stents in both my legs"    . IR GENERIC HISTORICAL  08/26/2015   IR RADIOLOGIST EVAL & MGMT 08/26/2015 Randy MoanHeath McCullough, Jacobson [facility] INTERV RAD  . IR GENERIC HISTORICAL  04/21/2016   IR RADIOLOGIST EVAL & MGMT 04/21/2016 Randy MoanHeath McCullough, Jacobson [facility] INTERV RAD    Social History:  reports that he quit smoking about 23 years ago. His smoking use included cigarettes. He started smoking about 59 years ago. He smoked 1.00 pack per day. he has never used smokeless tobacco. He reports that he drinks alcohol. He reports that he does not use drugs.  No Known Allergies  Family History  Problem Relation Age of Onset  . Diabetes Brother      Prior to Admission medications   Medication Sig Start Date End Date Taking? Authorizing Provider  acetaminophen (TYLENOL) 325 MG tablet Take  650 mg by mouth every 6 (six) hours as needed for mild pain.   Yes Provider, Historical, Jacobson  amitriptyline (ELAVIL) 10 MG tablet Take 10 mg by mouth daily. 11/20/15  Yes Provider, Historical, Jacobson  aspirin EC 81 MG tablet Take 81 mg by mouth daily.   Yes Provider, Historical, Jacobson  clopidogrel (PLAVIX) 75 MG tablet Take 75 mg by mouth daily. 10/30/15  Yes Provider, Historical, Jacobson  glimepiride (AMARYL) 4 MG tablet Take 4 mg by mouth daily. 09/08/15  Yes Provider, Historical, Jacobson  hydrochlorothiazide (HYDRODIURIL) 25 MG tablet Take 25 mg by mouth daily. 02/03/17  Yes Provider, Historical, Jacobson  JANUVIA 100 MG tablet Take 100 mg by mouth daily. 03/18/17  Yes Provider, Historical, Jacobson  losartan (COZAAR) 100 MG tablet Take 100 mg by mouth daily.   Yes Provider, Historical, Jacobson  simvastatin (ZOCOR) 40 MG tablet Take 40 mg by mouth daily.   Yes Provider, Historical, Jacobson  azithromycin (ZITHROMAX) 500 MG tablet Take 1 tablet (500 mg total) by mouth daily. For 2days Patient not taking: Reported on 04/08/2017 12/17/15   Zannie CoveJoseph, Preetha, Jacobson  cilostazol (PLETAL) 100 MG tablet Take 1 tablet (100 mg total) by mouth 2 (two) times daily before a meal. Patient not taking: Reported on 04/08/2017 05/05/16   Randy LibmanBrabham, Randy Jacobson, Jacobson  KRILL OIL PO Take 1 capsule by mouth daily.    Provider, Historical, Jacobson  Multiple Vitamin (MULTIVITAMIN WITH MINERALS) TABS tablet Take 1 tablet by mouth daily.    Provider, Historical, Jacobson  Physical Exam: Vitals:   04/08/17 1538  BP: 134/61  Pulse: 86  Resp: 16  Temp: 97.8 F (36.6 C)  SpO2: 98%     General: Well developed, well nourished, NAD, appears stated age  HEENT: NCAT, PERRLA, EOMI, Anicteic Sclera, mucous membranes moist.   Neck: Supple, no JVD, no masses  Cardiovascular: S1 S2 auscultated, no rubs, murmurs or gallops. Regular rate and rhythm.  Respiratory: Clear to auscultation bilaterally with equal chest rise  Abdomen: Soft, nontender, nondistended, + bowel  sounds  Extremities: warm dry without cyanosis clubbing or edema  Neuro: AAOx3, cranial nerves grossly intact. Strength 5/5 in patient's upper and lower extremities bilaterally  Skin: Without rashes exudates or nodules  Psych: Normal affect and demeanor with intact judgement and insight  Labs on Admission: I have personally reviewed following labs and imaging studies CBC: No results for input(s): WBC, NEUTROABS, HGB, HCT, MCV, PLT in the last 168 hours. Basic Metabolic Panel: No results for input(s): NA, K, CL, CO2, GLUCOSE, BUN, CREATININE, CALCIUM, MG, PHOS in the last 168 hours. GFR: CrCl cannot be calculated (Patient's most recent lab result is older than the maximum 21 days allowed.). Liver Function Tests: No results for input(s): AST, ALT, ALKPHOS, BILITOT, PROT, ALBUMIN in the last 168 hours. No results for input(s): LIPASE, AMYLASE in the last 168 hours. No results for input(s): AMMONIA in the last 168 hours. Coagulation Profile: No results for input(s): INR, PROTIME in the last 168 hours. Cardiac Enzymes: No results for input(s): CKTOTAL, CKMB, CKMBINDEX, TROPONINI in the last 168 hours. BNP (last 3 results) No results for input(s): PROBNP in the last 8760 hours. HbA1C: No results for input(s): HGBA1C in the last 72 hours. CBG: No results for input(s): GLUCAP in the last 168 hours. Lipid Profile: No results for input(s): CHOL, HDL, LDLCALC, TRIG, CHOLHDL, LDLDIRECT in the last 72 hours. Thyroid Function Tests: No results for input(s): TSH, T4TOTAL, FREET4, T3FREE, THYROIDAB in the last 72 hours. Anemia Panel: No results for input(s): VITAMINB12, FOLATE, FERRITIN, TIBC, IRON, RETICCTPCT in the last 72 hours. Urine analysis:    Component Value Date/Time   COLORURINE YELLOW 12/16/2015 1136   APPEARANCEUR CLEAR 12/16/2015 1136   LABSPEC 1.024 12/16/2015 1136   PHURINE 5.5 12/16/2015 1136   GLUCOSEU >1000 (A) 12/16/2015 1136   HGBUR NEGATIVE 12/16/2015 1136    BILIRUBINUR NEGATIVE 12/16/2015 1136   KETONESUR 15 (A) 12/16/2015 1136   PROTEINUR NEGATIVE 12/16/2015 1136   NITRITE NEGATIVE 12/16/2015 1136   LEUKOCYTESUR NEGATIVE 12/16/2015 1136   Sepsis Labs: @LABRCNTIP (procalcitonin:4,lacticidven:4) )No results found for this or any previous visit (from the past 240 hour(s)).   Radiological Exams on Admission: No results found.  EKG: None  Assessment/Plan Symptomatic anemia/possible GI bleed -Patient sent from his PCPs office for hemoglobin of 7.5 done on 04/07/2017 -Presents with fatigue, dizziness, shortness of breath -Recently had colonoscopy as well as endoscopy on 01/10/2017 with Dr. Marca AnconaKarki: EGD showed H. pylori gastritis, superficial gastric ulcers. Colonoscopy found for colon polyps which were removed. -Patient currently denies any hematochezia or melena -CBC ordered, will also monitor H&H every 8 hours -Gastroenterology consulted and appreciated -Will place on full liquid diet -If hemoglobin is close to 7, will transfuse -Will also obtain vitamin D 25-hydroxy level, TSH to look at other reasons for fatigue -FOBT and anemia panel ordered  PVD -Has seen vascular surgery in the past and has had stent placement -Plavix, aspirin held due to possible GI bleed -Continue statin. No longer on Pletal  Essential hypertension -Given possible GI bleed, will hold Cozaar and monitor closely -will order hydralazine with parameters  Diabetes mellitus, type II -Hold metformin, Amaryl -Place on insulin sliding scale and CBG monitoring  Hyperlipidemia -Continue statin  DVT prophylaxis: SCDs  Code Status: Full  Family Communication: Wife at bedside. Admission, patients condition and plan of care including tests being ordered have been discussed with the patient and wife who indicate understanding and agree with the plan and Code Status.  Disposition Plan: Home   Consults called: Gastroenterology Deboraha Sprang)   Admission status: observation    Time spent: 70 minutes  Nevin Grizzle D.O. [facility] Pager [phone]  If 7PM-7AM, please contact night-coverage [email or web address] Password [facility] 04/08/2017, 5:41 PM

## 2017-04-08 NOTE — Progress Notes (Signed)
CRITICAL VALUE ALERT  Critical Value:  Hemoglobin 6.8  Date & Time Notied:  04/08/2017 06:48 PM  Provider Notified: Dr. Catha GosselinMikhail  Orders Received/Actions taken: Order received for Type and Screen and 2 units RBCs.

## 2017-04-08 NOTE — Progress Notes (Signed)
Patient admitted directly to 5W 14. Alert and oriented x 4 with wife at bedside. Instructed on how to use call light. Paged MD and waiting for orders. Will continue to treat per MD orders.

## 2017-04-08 NOTE — Progress Notes (Signed)
Patient brought in home meds mixed together in one bottle. Called pharmacy who said to keep meds stored on the unit until wife is able to come to bring them home in the morning. Bottle currently in labeled bag at nurses station. Oncoming nurse aware. Also paged MD for PRN for sleep per patient request.

## 2017-04-09 DIAGNOSIS — E118 Type 2 diabetes mellitus with unspecified complications: Secondary | ICD-10-CM | POA: Diagnosis not present

## 2017-04-09 DIAGNOSIS — I10 Essential (primary) hypertension: Secondary | ICD-10-CM

## 2017-04-09 DIAGNOSIS — D509 Iron deficiency anemia, unspecified: Secondary | ICD-10-CM | POA: Diagnosis not present

## 2017-04-09 DIAGNOSIS — K922 Gastrointestinal hemorrhage, unspecified: Secondary | ICD-10-CM | POA: Diagnosis not present

## 2017-04-09 DIAGNOSIS — I739 Peripheral vascular disease, unspecified: Secondary | ICD-10-CM

## 2017-04-09 DIAGNOSIS — E785 Hyperlipidemia, unspecified: Secondary | ICD-10-CM | POA: Diagnosis not present

## 2017-04-09 LAB — CBC
HCT: 30 % — ABNORMAL LOW (ref 39.0–52.0)
Hemoglobin: 9.2 g/dL — ABNORMAL LOW (ref 13.0–17.0)
MCH: 23.4 pg — AB (ref 26.0–34.0)
MCHC: 30.7 g/dL (ref 30.0–36.0)
MCV: 76.3 fL — AB (ref 78.0–100.0)
PLATELETS: 213 10*3/uL (ref 150–400)
RBC: 3.93 MIL/uL — ABNORMAL LOW (ref 4.22–5.81)
RDW: 16.5 % — AB (ref 11.5–15.5)
WBC: 6 10*3/uL (ref 4.0–10.5)

## 2017-04-09 LAB — GLUCOSE, CAPILLARY
GLUCOSE-CAPILLARY: 210 mg/dL — AB (ref 65–99)
GLUCOSE-CAPILLARY: 256 mg/dL — AB (ref 65–99)
GLUCOSE-CAPILLARY: 322 mg/dL — AB (ref 65–99)
GLUCOSE-CAPILLARY: 158 mg/dL — AB (ref 65–99)

## 2017-04-09 LAB — HEMOGLOBIN AND HEMATOCRIT, BLOOD
HEMATOCRIT: 29.5 % — AB (ref 39.0–52.0)
HEMOGLOBIN: 8.9 g/dL — AB (ref 13.0–17.0)

## 2017-04-09 LAB — VITAMIN D 25 HYDROXY (VIT D DEFICIENCY, FRACTURES): Vit D, 25-Hydroxy: 28.5 ng/mL — ABNORMAL LOW (ref 30.0–100.0)

## 2017-04-09 LAB — BASIC METABOLIC PANEL
Anion gap: 10 (ref 5–15)
BUN: 19 mg/dL (ref 6–20)
CHLORIDE: 99 mmol/L — AB (ref 101–111)
CO2: 26 mmol/L (ref 22–32)
CREATININE: 0.81 mg/dL (ref 0.61–1.24)
Calcium: 9.3 mg/dL (ref 8.9–10.3)
GFR calc Af Amer: >60 mL/min (ref >60)
GFR calc non Af Amer: >60 mL/min (ref >60)
Glucose, Bld: 199 mg/dL — ABNORMAL HIGH (ref 65–99)
Potassium: 4.1 mmol/L (ref 3.5–5.1)
Sodium: 135 mmol/L (ref 135–145)

## 2017-04-09 LAB — CBC WITH DIFFERENTIAL/PLATELET
BASOS PCT: 1 %
Basophils Absolute: 0.1 10*3/uL (ref 0.0–0.1)
EOS ABS: 0.2 10*3/uL (ref 0.0–0.7)
Eosinophils Relative: 3 %
HEMATOCRIT: 23.4 % — AB (ref 39.0–52.0)
HEMOGLOBIN: 6.8 g/dL — AB (ref 13.0–17.0)
LYMPHS PCT: 28 %
Lymphs Abs: 1.7 10*3/uL (ref 0.7–4.0)
MCH: 21.2 pg — AB (ref 26.0–34.0)
MCHC: 29.1 g/dL — AB (ref 30.0–36.0)
MCV: 72.9 fL — ABNORMAL LOW (ref 78.0–100.0)
MONOS PCT: 9 %
Monocytes Absolute: 0.5 10*3/uL (ref 0.1–1.0)
NEUTROS ABS: 3.4 10*3/uL (ref 1.7–7.7)
NEUTROS PCT: 59 %
Platelets: 231 10*3/uL (ref 150–400)
RBC: 3.21 MIL/uL — ABNORMAL LOW (ref 4.22–5.81)
RDW: 16.3 % — ABNORMAL HIGH (ref 11.5–15.5)
WBC: 5.9 10*3/uL (ref 4.0–10.5)

## 2017-04-09 MED ORDER — TRAZODONE HCL 50 MG PO TABS
50.0000 mg | ORAL_TABLET | Freq: Every evening | ORAL | Status: DC | PRN
  Administered 2017-04-09 – 2017-04-10 (×2): 50 mg via ORAL
  Filled 2017-04-09 (×2): qty 1

## 2017-04-09 MED ORDER — SODIUM CHLORIDE 0.9 % IV SOLN
510.0000 mg | Freq: Once | INTRAVENOUS | Status: AC
  Administered 2017-04-09: 510 mg via INTRAVENOUS
  Filled 2017-04-09: qty 17

## 2017-04-09 MED ORDER — PANTOPRAZOLE SODIUM 40 MG IV SOLR
40.0000 mg | Freq: Two times a day (BID) | INTRAVENOUS | Status: DC
  Administered 2017-04-09 – 2017-04-11 (×5): 40 mg via INTRAVENOUS
  Filled 2017-04-09 (×5): qty 40

## 2017-04-09 NOTE — Progress Notes (Signed)
PROGRESS NOTE    Randy Jacobson  [id number] DOB: 11/10/1945 DOA: 04/08/2017 PCP: Nila NephewGreen, Edwin, MD  Brief Narrative: Randy Jacobson is a 71 y.o. male with a medical history of hypertension, diabetes, for full vascular disease, who presented to the hospital today for symptomatic anemia. Patient states he's been having shortness of breath, fatigue, dizziness for the past several months. He was noted to have a low hemoglobin back in September and was sent to gastroenterology office for colonoscopy and endoscopy, EGD showed H. pylori gastritis, superficial gastric ulcers. Colonoscopy found for colon polyps which were removed. 12/28: patient presented to his PCPs office, was found to have a hemoglobin of 7.5. Anemia panel with severe Iron defi, stools darker brown for 2weeks  Assessment & Plan:  Severe iron deficiency anemia -Admission hemoglobin down to 6.8 which is down from 11-12 range earlier this year and last year -Recent GI workup 01/10/2017 with Dr. Marca AnconaKarki: EGD showed H. pylori gastritis, superficial gastric ulcers. Colonoscopy found for colon polyps which were removed. -He took PPI for a few weeks and then stopped with a prescription ran out, denies NSAID use, takes aspirin and Plavix every day, now held -Patient reports darker brown stools for the last 1-2 weeks -Gastroenterology consulted -Plan for capsule endoscopy tomorrow -Will give an iron infusion today -Restart IV PPI for now  PVD -Prior history of iliac artery stenting -Aspirin and Plavix held due to GI bleed fortunately his stent was more than a year ago  Essential hypertension -Holding Cozaar, continue hydralazine PRN  Diabetes mellitus, type II -Hold metformin, Amaryl -Place on insulin sliding scale and CBG monitoring  Hyperlipidemia -Continue statin  DVT prophylaxis: SCDs  Code Status: Full Family Communication:  no family at bedside Disposition Plan: Home  pending workup  Consultants:   [facility]   Procedures:   Antimicrobials:    Subjective: -ok, feels better after blood, reports darker brown stools for the last 1-2 weeks  Objective: Vitals:   04/09/17 0056 04/09/17 0057 04/09/17 0123 04/09/17 0348  BP: (!) 120/46 (!) 120/46 (!) 121/45 (!) 113/51  Pulse: 76 76 70 74  Resp: 14 14 14 14   Temp: 97.6 F (36.4 C) 97.6 F (36.4 C) 97.6 F (36.4 C) 98.1 F (36.7 C)  TempSrc:  Oral Oral Oral  SpO2: 98% 98% 98% 97%  Weight:      Height:        Intake/Output Summary (Last 24 hours) at 04/09/2017 1215 Last data filed at 04/09/2017 1032 Gross per 24 hour  Intake 800 ml  Output -  Net 800 ml   Filed Weights   04/08/17 2140  Weight: 78.9 kg (174 lb)    Examination:  General exam: Appears calm and comfortable  Respiratory system: Clear to auscultation. Respiratory effort normal. Cardiovascular system: S1 & S2 heard, RRR. No JVD, murmurs, rubs, gallops Gastrointestinal system: Abdomen is nondistended, soft and nontender.Normal bowel sounds heard. Central nervous system: Alert and oriented. No focal neurological deficits. Extremities: Symmetric 5 x 5 power. Skin: No rashes, lesions or ulcers Psychiatry: Judgement and insight appear normal. Mood & affect appropriate.     Data Reviewed:   CBC: Recent Labs  Lab 04/08/17 1714 04/09/17 0552 04/09/17 0839  WBC 5.9 6.0  --   NEUTROABS 3.4  --   --   HGB 6.8* 9.2* 8.9*  HCT 23.4* 30.0* 29.5*  MCV 72.9* 76.3*  --   PLT 231 213  --    Basic Metabolic Panel: Recent Labs  Lab 04/08/17 1714 04/09/17 0552  NA 134* 135  K 4.1 4.1  CL 100* 99*  CO2 25 26  GLUCOSE 230* 199*  BUN 25* 19  CREATININE 1.11 0.81  CALCIUM 9.2 9.3   GFR: Estimated Creatinine Clearance: 86.5 mL/min (by C-G formula based on SCr of 0.81 mg/dL). Liver Function Tests: Recent Labs  Lab 04/08/17 1714  AST 23  ALT 23  ALKPHOS 64  BILITOT 0.5  PROT 6.8  ALBUMIN 4.0   No results for input(s): LIPASE, AMYLASE in the last 168  hours. No results for input(s): AMMONIA in the last 168 hours. Coagulation Profile: No results for input(s): INR, PROTIME in the last 168 hours. Cardiac Enzymes: No results for input(s): CKTOTAL, CKMB, CKMBINDEX, TROPONINI in the last 168 hours. BNP (last 3 results) No results for input(s): PROBNP in the last 8760 hours. HbA1C: No results for input(s): HGBA1C in the last 72 hours. CBG: Recent Labs  Lab 04/08/17 1759 04/08/17 2143 04/09/17 0734 04/09/17 1145  GLUCAP 198* 230* 210* 256*   Lipid Profile: No results for input(s): CHOL, HDL, LDLCALC, TRIG, CHOLHDL, LDLDIRECT in the last 72 hours. Thyroid Function Tests: Recent Labs    04/08/17 1714  TSH 0.513   Anemia Panel: Recent Labs    04/08/17 1745 04/08/17 1746  VITAMINB12 374  --   FOLATE  --  24.0  FERRITIN 5*  --   TIBC 449  --   IRON 14*  --   RETICCTPCT 2.6  --    Urine analysis:    Component Value Date/Time   COLORURINE YELLOW 12/16/2015 1136   APPEARANCEUR CLEAR 12/16/2015 1136   LABSPEC 1.024 12/16/2015 1136   PHURINE 5.5 12/16/2015 1136   GLUCOSEU >1000 (A) 12/16/2015 1136   HGBUR NEGATIVE 12/16/2015 1136   BILIRUBINUR NEGATIVE 12/16/2015 1136   KETONESUR 15 (A) 12/16/2015 1136   PROTEINUR NEGATIVE 12/16/2015 1136   NITRITE NEGATIVE 12/16/2015 1136   LEUKOCYTESUR NEGATIVE 12/16/2015 1136   Sepsis Labs: @LABRCNTIP (procalcitonin:4,lacticidven:4)  )No results found for this or any previous visit (from the past 240 hour(s)).       Radiology Studies: No results found.      Scheduled Meds: . amitriptyline  10 mg Oral Daily  . insulin aspart  0-15 Units Subcutaneous TID WC  . pantoprazole (PROTONIX) IV  40 mg Intravenous Q12H  . simvastatin  40 mg Oral Daily  . sodium chloride flush  3 mL Intravenous Q12H   Continuous Infusions: . sodium chloride       LOS: 1 day    Time spent: 35min    Zannie CovePreetha Geniece Akers, MD [facility] Page via [email or web address], password [email or web address] After 7PM  please contact night-coverage  04/09/2017, 12:15 PM

## 2017-04-09 NOTE — Consult Note (Signed)
Referring Provider: Dr. Catha GosselinMikhail Primary Care Physician:  Nila NephewGreen, Edwin, MD Primary Gastroenterologist:  Dr. Marca AnconaKarki  Reason for Consultation:  Anemia; Heme  HPI: Randy Jacobson is a 71 y.o. male with a history of iron deficiency anemia seen by Dr. Marca AnconaKarki in September 2018 with a Hgb 9.7 at that time (Hgb 10.4 in 8/18). EGD (10/18) showed H. Pylori gastritis and peptic duodenitis with small antral clean-based antral ulcers. Colonoscopy (10/18) showed left-sided diverticulosis and 2 diminutive tubular adenomas (cecum) and 2 sessile serrated polyps (8 mm - 10 mm) from the ascending colon were removed. He is admitted due to worsening fatigue, dizziness, and shortness of breath for the past few months. Hgb 7.5 at his doctor's office yesterday and sent to the hospital. Hgb 6.8 on admit. Denies rectal bleeding, melena, hematochezia, N/V, hematemesis, abdominal pain, or chest pain. On ASA, Plavix at home.  Past Medical History:  Diagnosis Date  . Diabetes mellitus without complication (HCC)   . Hypertension     Past Surgical History:  Procedure Laterality Date  . "stents in both my legs"    . IR GENERIC HISTORICAL  08/26/2015   IR RADIOLOGIST EVAL & MGMT 08/26/2015 Malachy MoanHeath McCullough, MD [facility] INTERV RAD  . IR GENERIC HISTORICAL  04/21/2016   IR RADIOLOGIST EVAL & MGMT 04/21/2016 Malachy MoanHeath McCullough, MD [facility] INTERV RAD    Prior to Admission medications   Medication Sig Start Date End Date Taking? Authorizing Provider  acetaminophen (TYLENOL) 325 MG tablet Take 650 mg by mouth every 6 (six) hours as needed for mild pain.   Yes [provider]  amitriptyline (ELAVIL) 10 MG tablet Take 10 mg by mouth daily. 11/20/15  Yes [provider]  aspirin EC 81 MG tablet Take 81 mg by mouth daily.   Yes [provider]  clopidogrel (PLAVIX) 75 MG tablet Take 75 mg by mouth daily. 10/30/15  Yes [provider]  glimepiride (AMARYL) 4 MG tablet Take 4 mg by mouth 2 (two) times daily.   09/08/15  Yes [provider]  hydrochlorothiazide (HYDRODIURIL) 25 MG tablet Take 25 mg by mouth daily. 02/03/17  Yes [provider]  JANUVIA 100 MG tablet Take 100 mg by mouth daily. 03/18/17  Yes [provider]  losartan (COZAAR) 100 MG tablet Take 100 mg by mouth daily.   Yes [provider]  metFORMIN (GLUCOPHAGE) 1000 MG tablet Take 1,000 mg by mouth 2 (two) times daily with a meal.   Yes [provider]  Multiple Vitamin (MULTIVITAMIN WITH MINERALS) TABS tablet Take 1 tablet by mouth daily.   Yes [provider]  simvastatin (ZOCOR) 40 MG tablet Take 40 mg by mouth daily.   Yes [provider]    Scheduled Meds: . amitriptyline  10 mg Oral Daily  . insulin aspart  0-15 Units Subcutaneous TID WC  . pantoprazole (PROTONIX) IV  40 mg Intravenous Q12H  . simvastatin  40 mg Oral Daily  . sodium chloride flush  3 mL Intravenous Q12H   Continuous Infusions: . sodium chloride    . ferumoxytol Stopped (04/09/17 1047)   PRN Meds:.sodium chloride, acetaminophen **OR** acetaminophen, hydrALAZINE, ondansetron **OR** ondansetron (ZOFRAN) IV, sodium chloride flush  Allergies as of 04/08/2017  . (No Known Allergies)    Family History  Problem Relation Age of Onset  . Diabetes Brother     Social History   Socioeconomic History  . Marital status: Single    Spouse name: Not on file  . Number of  children: Not on file  . Years of education: Not on file  . Highest education level: Not on file  Social Needs  . Financial resource strain: Not on file  . Food insecurity - worry: Not on file  . Food insecurity - inability: Not on file  . Transportation needs - medical: Not on file  . Transportation needs - non-medical: Not on file  Occupational History  . Not on file  Tobacco Use  . Smoking status: Former Smoker    Packs/day: 1.00    Types: Cigarettes    Start date: 11/20/1957    Last attempt to quit: 11/20/1993    Years  since quitting: 23.4  . Smokeless tobacco: Never Used  Substance and Sexual Activity  . Alcohol use: Yes    Alcohol/week: 0.0 oz    Comment: Beer 12 pack/week     . Drug use: No    Comment: Hx of marijuana use     . Sexual activity: Not on file  Other Topics Concern  . Not on file  Social History Narrative  . Not on file    Review of Systems: All negative except as stated above in HPI.  Physical Exam: Vital signs: Vitals:   04/09/17 0123 04/09/17 0348  BP: (!) 121/45 (!) 113/51  Pulse: 70 74  Resp: 14 14  Temp: 97.6 F (36.4 C) 98.1 F (36.7 C)  SpO2: 98% 97%   Last BM Date: 04/08/17 General:  Lethargic, elderly, Well-developed, well-nourished, pleasant and cooperative in NAD Head: normocephalic, atraumatic Eyes: anicteric sclera ENT: oropharynx clear Neck: supple, nontender Lungs:  Clear throughout to auscultation.   No wheezes, crackles, or rhonchi. No acute distress. Heart:  Regular rate and rhythm; no murmurs, clicks, rubs,  or gallops. Abdomen: soft, nontender, nondistended, +BS  Rectal:  Deferred Ext: no edema  GI:  Lab Results: Recent Labs    04/08/17 1714 04/09/17 0552 04/09/17 0839  WBC 5.9 6.0  --   HGB 6.8* 9.2* 8.9*  HCT 23.4* 30.0* 29.5*  PLT 231 213  --    BMET Recent Labs    04/08/17 1714 04/09/17 0552  NA 134* 135  K 4.1 4.1  CL 100* 99*  CO2 25 26  GLUCOSE 230* 199*  BUN 25* 19  CREATININE 1.11 0.81  CALCIUM 9.2 9.3   LFT Recent Labs    04/08/17 1714  PROT 6.8  ALBUMIN 4.0  AST 23  ALT 23  ALKPHOS 64  BILITOT 0.5   PT/INR No results for input(s): LABPROT, INR in the last 72 hours.   Studies/Results: No results found.  Impression/Plan: Symptomatic anemia with Hgb 6.8 (9.7 in September). S/P 2 U PRBCs and Hgb post-transfusion of 9.2. Needs a capsule endoscopy and will have it placed tomorrow morning since had a full liquid diet this morning. Soft food for lunch and then full liquid for dinner and NPO p MN. Capsule  results will be read Monday by one of my [facility] partners. If his H/H remains stable then should be ok to go home Monday afternoon. Capsule to be placed tomorrow morning by GI endoscopy nurse. Patient aware of plan.    LOS: 1 day   Derik Fults C.  04/09/2017, 10:44 AM  Pager [phone]  AFTER 5 pm or on weekends please call [phone]

## 2017-04-10 ENCOUNTER — Encounter (HOSPITAL_COMMUNITY)
Admission: AD | Disposition: A | Discharge status: Home / Self Care | Payer: Self-pay | Source: Ambulatory Visit | Attending: Internal Medicine

## 2017-04-10 ENCOUNTER — Encounter (HOSPITAL_COMMUNITY): Payer: Self-pay

## 2017-04-10 DIAGNOSIS — D509 Iron deficiency anemia, unspecified: Secondary | ICD-10-CM | POA: Diagnosis not present

## 2017-04-10 DIAGNOSIS — K922 Gastrointestinal hemorrhage, unspecified: Secondary | ICD-10-CM | POA: Diagnosis not present

## 2017-04-10 DIAGNOSIS — E785 Hyperlipidemia, unspecified: Secondary | ICD-10-CM

## 2017-04-10 DIAGNOSIS — E118 Type 2 diabetes mellitus with unspecified complications: Secondary | ICD-10-CM | POA: Diagnosis not present

## 2017-04-10 DIAGNOSIS — I739 Peripheral vascular disease, unspecified: Secondary | ICD-10-CM | POA: Diagnosis not present

## 2017-04-10 HISTORY — PX: GIVENS CAPSULE STUDY: SHX5432

## 2017-04-10 LAB — TYPE AND SCREEN
ABO/RH(D): O NEG
Antibody Screen: NEGATIVE
UNIT DIVISION: 0
Unit division: 0

## 2017-04-10 LAB — BPAM RBC
BLOOD PRODUCT EXPIRATION DATE: 201901042359
Blood Product Expiration Date: 201901102359
ISSUE DATE / TIME: 201812290056
ISSUE DATE / TIME: 201812282131
Unit Type and Rh: 9500
Unit Type and Rh: 9500

## 2017-04-10 LAB — BASIC METABOLIC PANEL
Anion gap: 9 (ref 5–15)
BUN: 14 mg/dL (ref 6–20)
CHLORIDE: 98 mmol/L — AB (ref 101–111)
CO2: 27 mmol/L (ref 22–32)
CREATININE: 0.87 mg/dL (ref 0.61–1.24)
Calcium: 9 mg/dL (ref 8.9–10.3)
GFR calc Af Amer: >60 mL/min (ref >60)
GFR calc non Af Amer: >60 mL/min (ref >60)
Glucose, Bld: 166 mg/dL — ABNORMAL HIGH (ref 65–99)
POTASSIUM: 4.2 mmol/L (ref 3.5–5.1)
SODIUM: 134 mmol/L — AB (ref 135–145)

## 2017-04-10 LAB — CBC
HEMATOCRIT: 30.1 % — AB (ref 39.0–52.0)
HEMOGLOBIN: 9.2 g/dL — AB (ref 13.0–17.0)
MCH: 23.2 pg — AB (ref 26.0–34.0)
MCHC: 30.6 g/dL (ref 30.0–36.0)
MCV: 75.8 fL — AB (ref 78.0–100.0)
Platelets: 195 10*3/uL (ref 150–400)
RBC: 3.97 MIL/uL — AB (ref 4.22–5.81)
RDW: 16.6 % — ABNORMAL HIGH (ref 11.5–15.5)
WBC: 5.8 10*3/uL (ref 4.0–10.5)

## 2017-04-10 LAB — GLUCOSE, CAPILLARY
GLUCOSE-CAPILLARY: 181 mg/dL — AB (ref 65–99)
Glucose-Capillary: 175 mg/dL — ABNORMAL HIGH (ref 65–99)
Glucose-Capillary: 312 mg/dL — ABNORMAL HIGH (ref 65–99)

## 2017-04-10 SURGERY — IMAGING PROCEDURE, GI TRACT, INTRALUMINAL, VIA CAPSULE
Anesthesia: LOCAL

## 2017-04-10 SURGICAL SUPPLY — 1 items: TOWEL COTTON PACK 4EA (MISCELLANEOUS) ×4 IMPLANT

## 2017-04-10 NOTE — Progress Notes (Signed)
[facility] Progress Note  Randy JoinerJohn T Velis 71 y.o. 10-18-1945   Subjective: Capsule placed this morning. Feels ok.  Objective: Vital signs: Vitals:   04/09/17 2124 04/10/17 0559  BP: (!) 143/71 124/62  Pulse: 77 77  Resp: 18 18  Temp: 97.8 F (36.6 C) 97.8 F (36.6 C)  SpO2: 96% 96%    Physical Exam: Gen: alert, no acute distress  HEENT: anicteric sclera CV: RRR Chest: CTA B Abd: soft, nontender, nondistended, +BS Ext: no edema  Lab Results: Recent Labs    04/09/17 0552 04/10/17 0330  NA 135 134*  K 4.1 4.2  CL 99* 98*  CO2 26 27  GLUCOSE 199* 166*  BUN 19 14  CREATININE 0.81 0.87  CALCIUM 9.3 9.0   Recent Labs    04/08/17 1714  AST 23  ALT 23  ALKPHOS 64  BILITOT 0.5  PROT 6.8  ALBUMIN 4.0   Recent Labs    04/08/17 1714 04/09/17 0552 04/09/17 0839 04/10/17 0330  WBC 5.9 6.0  --  5.8  NEUTROABS 3.4  --   --   --   HGB 6.8* 9.2* 8.9* 9.2*  HCT 23.4* 30.0* 29.5* 30.1*  MCV 72.9* 76.3*  --  75.8*  PLT 231 213  --  195      Assessment/Plan: Obscure occult GI bleeding - capsule placed this morning and will be available to read tomorrow by Dr. Arita MissKarki/Edwards. If Hgb remains stable, then ok to discharge tomorrow afternoon if capsule is unrevealing. Diet recs per capsule endoscopy orders.   Kiosha Buchan C. 04/10/2017, 12:58 PM  Pager [phone]  AFTER 5 PM or on weekends please call [phone] ID: Randy JoinerJohn T Agresti, male   DOB: 10-18-1945, 71 y.o.   MRN: [id number]

## 2017-04-10 NOTE — Progress Notes (Signed)
Patient ingested givens capsule for small bowel endoscopy 04/10/2017 at 1025. Patient is wear equipment for a full 12 hours and may remove 04/10/2017 at 2225. Endoscopy staff will collect 04/11/17 in the AM. Patient is to remain strict NPO for first 2 hours of test. He may have clear liquids starting 04/10/2017 at 1225 Verbal and written instructions reviewed with patient and primary RN.

## 2017-04-10 NOTE — Care Management Obs Status (Signed)
MEDICARE OBSERVATION STATUS NOTIFICATION   Patient Details  Name: Randy Jacobson MRN: [id number] Date of Birth: Mar 02, 1946   Medicare Observation Status Notification Given:  Yes    Lawerance Sabalebbie Thaer Miyoshi, RN 04/10/2017, 9:04 AM

## 2017-04-10 NOTE — Progress Notes (Signed)
PROGRESS NOTE    Randy Jacobson  WUJ:[id number] DOB: 02-14-1946 DOA: 04/08/2017 PCP: Nila NephewGreen, Edwin, MD  Brief Narrative: Randy JoinerJohn T Guerin is a 71 y.o. male with a medical history of hypertension, diabetes, for full vascular disease, who presented to the hospital today for symptomatic anemia. Patient states he's been having shortness of breath, fatigue, dizziness for the past several months. He was noted to have a low hemoglobin back in September and was sent to gastroenterology office for colonoscopy and endoscopy, EGD showed H. pylori gastritis, superficial gastric ulcers. Colonoscopy found for colon polyps which were removed. 12/28: patient presented to his PCPs office, was found to have a hemoglobin of 7.5. Anemia panel with severe Iron defi, stools darker brown for 2weeks  Assessment & Plan:  Severe iron deficiency anemia -Admission hemoglobin down to 6.8 which is down from 11-12 range earlier this year and last year -Recent GI workup 01/10/2017 with Dr. Marca AnconaKarki: EGD showed H. pylori gastritis, superficial gastric ulcers. Colonoscopy found for colon polyps which were removed. -He took PPI for a few weeks and then stopped with a prescription ran out, denies NSAID use, takes aspirin and Plavix every day, now held -Patient reported darker brown stools for the last 1-2 weeks -Gastroenterology consulted, Plan for capsule endoscopy today -s/p iron infusion 12/29 -continue PPI, monitor HB  PVD -Prior history of iliac artery stenting -Aspirin and Plavix held due to GI bleed, fortunately his stent was more than a year ago  Essential hypertension -Holding Cozaar, continue hydralazine PRN  Diabetes mellitus, type II -Hold metformin, Amaryl -Place on insulin sliding scale and CBG monitoring  Hyperlipidemia -Continue statin  DVT prophylaxis: SCDs  Code Status: Full Family Communication:  no family at bedside Disposition Plan: Home  pending workup  Consultants:   [facility]   Procedures:   Antimicrobials:    Subjective: -Feels okay, no bowel movement yet, waiting for his capsule study  Objective: Vitals:   04/09/17 1220 04/09/17 2124 04/10/17 0559 04/10/17 1018  BP: 119/70 (!) 143/71 124/62   Pulse: 78 77 77   Resp: 16 18 18    Temp: (!) 97.5 F (36.4 C) 97.8 F (36.6 C) 97.8 F (36.6 C)   TempSrc:   Tympanic   SpO2: 96% 96% 96%   Weight:    78.9 kg (174 lb)  Height:    5\' 11"  (1.803 m)    Intake/Output Summary (Last 24 hours) at 04/10/2017 1216 Last data filed at 04/10/2017 1000 Gross per 24 hour  Intake 163 ml  Output -  Net 163 ml   Filed Weights   04/08/17 2140 04/10/17 1018  Weight: 78.9 kg (174 lb) 78.9 kg (174 lb)    Examination:  Gen: Awake, Alert, Oriented X 3,  HEENT: PERRLA, Neck supple, no JVD Lungs: Good air movement bilaterally, CTAB CVS: RRR,No Gallops,Rubs or new Murmurs Abd: soft, Non tender, non distended, BS present Extremities: No Cyanosis, Clubbing or edema Skin: no new rashes Psychiatry: Judgement and insight appear normal. Mood & affect appropriate.     Data Reviewed:   CBC: Recent Labs  Lab 04/08/17 1714 04/09/17 0552 04/09/17 0839 04/10/17 0330  WBC 5.9 6.0  --  5.8  NEUTROABS 3.4  --   --   --   HGB 6.8* 9.2* 8.9* 9.2*  HCT 23.4* 30.0* 29.5* 30.1*  MCV 72.9* 76.3*  --  75.8*  PLT 231 213  --  195   Basic Metabolic Panel: Recent Labs  Lab 04/08/17 1714 04/09/17 0552 04/10/17  0330  NA 134* 135 134*  K 4.1 4.1 4.2  CL 100* 99* 98*  CO2 25 26 27   GLUCOSE 230* 199* 166*  BUN 25* 19 14  CREATININE 1.11 0.81 0.87  CALCIUM 9.2 9.3 9.0   GFR: Estimated Creatinine Clearance: 80.5 mL/min (by C-G formula based on SCr of 0.87 mg/dL). Liver Function Tests: Recent Labs  Lab 04/08/17 1714  AST 23  ALT 23  ALKPHOS 64  BILITOT 0.5  PROT 6.8  ALBUMIN 4.0   No results for input(s): LIPASE, AMYLASE in the last 168 hours. No results for input(s): AMMONIA in the last 168  hours. Coagulation Profile: No results for input(s): INR, PROTIME in the last 168 hours. Cardiac Enzymes: No results for input(s): CKTOTAL, CKMB, CKMBINDEX, TROPONINI in the last 168 hours. BNP (last 3 results) No results for input(s): PROBNP in the last 8760 hours. HbA1C: No results for input(s): HGBA1C in the last 72 hours. CBG: Recent Labs  Lab 04/09/17 1145 04/09/17 1658 04/09/17 2122 04/10/17 0819 04/10/17 1209  GLUCAP 256* 322* 158* 181* 175*   Lipid Profile: No results for input(s): CHOL, HDL, LDLCALC, TRIG, CHOLHDL, LDLDIRECT in the last 72 hours. Thyroid Function Tests: Recent Labs    04/08/17 1714  TSH 0.513   Anemia Panel: Recent Labs    04/08/17 1745 04/08/17 1746  VITAMINB12 374  --   FOLATE  --  24.0  FERRITIN 5*  --   TIBC 449  --   IRON 14*  --   RETICCTPCT 2.6  --    Urine analysis:    Component Value Date/Time   COLORURINE YELLOW 12/16/2015 1136   APPEARANCEUR CLEAR 12/16/2015 1136   LABSPEC 1.024 12/16/2015 1136   PHURINE 5.5 12/16/2015 1136   GLUCOSEU >1000 (A) 12/16/2015 1136   HGBUR NEGATIVE 12/16/2015 1136   BILIRUBINUR NEGATIVE 12/16/2015 1136   KETONESUR 15 (A) 12/16/2015 1136   PROTEINUR NEGATIVE 12/16/2015 1136   NITRITE NEGATIVE 12/16/2015 1136   LEUKOCYTESUR NEGATIVE 12/16/2015 1136   Sepsis Labs: @LABRCNTIP (procalcitonin:4,lacticidven:4)  )No results found for this or any previous visit (from the past 240 hour(s)).       Radiology Studies: No results found.      Scheduled Meds: . amitriptyline  10 mg Oral Daily  . insulin aspart  0-15 Units Subcutaneous TID WC  . pantoprazole (PROTONIX) IV  40 mg Intravenous Q12H  . simvastatin  40 mg Oral Daily  . sodium chloride flush  3 mL Intravenous Q12H   Continuous Infusions: . sodium chloride       LOS: 1 day    Time spent: 35min    Zannie CovePreetha Aydin Hink, MD [facility] Page via [email or web address], password [email or web address] After 7PM please contact  night-coverage  04/10/2017, 12:16 PM

## 2017-04-11 DIAGNOSIS — E118 Type 2 diabetes mellitus with unspecified complications: Secondary | ICD-10-CM | POA: Diagnosis not present

## 2017-04-11 DIAGNOSIS — D509 Iron deficiency anemia, unspecified: Secondary | ICD-10-CM | POA: Diagnosis not present

## 2017-04-11 DIAGNOSIS — K922 Gastrointestinal hemorrhage, unspecified: Secondary | ICD-10-CM | POA: Diagnosis not present

## 2017-04-11 DIAGNOSIS — I10 Essential (primary) hypertension: Secondary | ICD-10-CM | POA: Diagnosis not present

## 2017-04-11 DIAGNOSIS — I739 Peripheral vascular disease, unspecified: Secondary | ICD-10-CM | POA: Diagnosis not present

## 2017-04-11 LAB — CBC
HCT: 30.9 % — ABNORMAL LOW (ref 39.0–52.0)
HEMOGLOBIN: 9.4 g/dL — AB (ref 13.0–17.0)
MCH: 23.4 pg — AB (ref 26.0–34.0)
MCHC: 30.4 g/dL (ref 30.0–36.0)
MCV: 76.9 fL — AB (ref 78.0–100.0)
Platelets: 204 10*3/uL (ref 150–400)
RBC: 4.02 MIL/uL — ABNORMAL LOW (ref 4.22–5.81)
RDW: 17 % — ABNORMAL HIGH (ref 11.5–15.5)
WBC: 6.3 10*3/uL (ref 4.0–10.5)

## 2017-04-11 LAB — GLUCOSE, CAPILLARY
GLUCOSE-CAPILLARY: 223 mg/dL — AB (ref 65–99)
GLUCOSE-CAPILLARY: 221 mg/dL — AB (ref 65–99)
Glucose-Capillary: 213 mg/dL — ABNORMAL HIGH (ref 65–99)

## 2017-04-11 MED ORDER — AMOXICILL-CLARITHRO-LANSOPRAZ PO MISC: Freq: Two times a day (BID) | ORAL | 0 refills | Status: DC

## 2017-04-11 MED ORDER — PANTOPRAZOLE SODIUM 40 MG PO TBEC: 40.0000 mg | DELAYED_RELEASE_TABLET | Freq: Every day | ORAL | 0 refills | Status: DC

## 2017-04-11 MED ORDER — PANTOPRAZOLE SODIUM 40 MG PO TBEC
40.0000 mg | DELAYED_RELEASE_TABLET | Freq: Every day | ORAL | 0 refills | Status: DC
Start: 2017-04-11 — End: 2017-04-11

## 2017-04-11 NOTE — Discharge Summary (Signed)
Physician Discharge Summary  Randy Jacobson:[id number] DOB: [birth year] DOA: 04/08/2017  PCP: Nila Nephew, MD  Admit date: 04/08/2017 Discharge date: 04/11/2017  Time spent: 45 minutes  Recommendations for Outpatient Follow-up:  PCP in 1 week, please check CBC at [facility] Dr.Karki in 2 weeks, please consider starting oral iron and or repeat IV iron infusions  Discharge Diagnoses:    GI Bleed   H/o gastric ulcers   Iron deficiency anemia   PAD (peripheral artery disease) (HCC)   Diabetes mellitus, type 2 (HCC)   Hyperlipidemia   Hypertension   GI bleeding   Discharge Condition: stable  Diet recommendation: regular  Filed Weights   04/08/17 2140 04/10/17 1018  Weight: 78.9 kg (174 lb) 78.9 kg (174 lb)    History of present illness:  Randy Jacobson a 71 y.o.malewith a medical history ofhypertension, diabetes, for full vascular disease, who presented to the hospital today for symptomatic anemia. Patient states he's been having shortness of breath, fatigue, dizziness for the past several months. He was noted to have a low hemoglobin back in September and was sent to gastroenterology office for colonoscopy and endoscopy, EGD showed H. pylori gastritis, superficial gastric ulcers. Colonoscopy found for colon polyps which were removed. 12/28: patient presented to his PCPs office, was found to have a hemoglobin of 7.5. Anemia panel with severe Iron defi, stools darker brown for 2weeks  Hospital Course:   Severe iron deficiency anemia -Admission hemoglobin down to 6.8 which is down from 11-12 range earlier this year and last year -Recent GI workup 01/10/2017 with Dr.Karki:EGD showed H. pylori gastritis, superficial gastric ulcers. Colonoscopy found for colon polyps which were removed. -He took PPI for a few weeks and then stopped with a prescription ran out, denies NSAID use, takes aspirin and Plavix every day,  -Patient reported darker brown stools for the last 1-2  weeks -Gastroenterology consulted, Capsule endoscopy completed yesterday which was normal, he was given iron infusion 12/29 -At this time we suspect that he probably bled from his recently known gastric ulcers which are superficial in the setting of antiplatelets, previously he tested positive for H pylori however hadn't been treated to be have started Prevpac at discharge -And stopped aspirin, he will continue Plavix due to his iliac artery stent  -He is stable for discharge from medical and GI standpoint  -He will follow-up with his PCP in one week could start oral iron at follow-up  -Follow-up with gastroenterology Dr.Karki in 2 weeks   PVD -Prior history of iliac artery stenting -Aspirin and Plavix held due to GI bleed, fortunately his stent was more than a year ago, aspirin stopped, Plavix resumed at discharge   Essential hypertension -Stable, resumed Cozaar and HCTZ  Diabetes mellitus, type II -Stable, resumed metformin and Amaryl   Hyperlipidemia -Continue statin  Procedures:  Capsule endoscopy 12/30  Consultations:  GI  Discharge Exam: Vitals:   04/10/17 2119 04/11/17 0531  BP: (!) 146/57 (!) 142/58  Pulse: 81 74  Resp: 18 18  Temp: 98 F (36.7 C) 97.7 F (36.5 C)  SpO2: 96% 98%    General: AAOx3 Cardiovascular: S1S2/RRR Respiratory: CTAB  Discharge Instructions    Allergies as of 04/11/2017   No Known Allergies     Medication List    STOP taking these medications   aspirin EC 81 MG tablet     TAKE these medications   acetaminophen 325 MG tablet Commonly known as:  TYLENOL Take 650 mg by mouth every 6 (  six) hours as needed for mild pain.   amitriptyline 10 MG tablet Commonly known as:  ELAVIL Take 10 mg by mouth daily.   amoxicillin-clarithromycin-lansoprazole combo pack Commonly known as:  PREVPAC Take by mouth 2 (two) times daily. Follow package directions.   clopidogrel 75 MG tablet Commonly known as:  PLAVIX Take 75 mg by mouth  daily.   glimepiride 4 MG tablet Commonly known as:  AMARYL Take 4 mg by mouth 2 (two) times daily.   hydrochlorothiazide 25 MG tablet Commonly known as:  HYDRODIURIL Take 25 mg by mouth daily.   JANUVIA 100 MG tablet Generic drug:  sitaGLIPtin Take 100 mg by mouth daily.   losartan 100 MG tablet Commonly known as:  COZAAR Take 100 mg by mouth daily.   metFORMIN 1000 MG tablet Commonly known as:  GLUCOPHAGE Take 1,000 mg by mouth 2 (two) times daily with a meal.   multivitamin with minerals Tabs tablet Take 1 tablet by mouth daily.   pantoprazole 40 MG tablet Commonly known as:  PROTONIX Take 1 tablet (40 mg total) by mouth daily. Please start this after your packet of PREVPAC is complete to prevent duplication   simvastatin 40 MG tablet Commonly known as:  ZOCOR Take 40 mg by mouth daily.      No Known Allergies Follow-up Information    Nila NephewGreen, Edwin, MD. Schedule an appointment as soon as possible for a visit in 1 week(s).   Specialty:  Internal Medicine Contact information: [address], [address] [place] [address] [phone]        Kerin SalenKarki, Arya, MD Follow up in 2 week(s).   Specialty:  Gastroenterology Why:  Office will call you Contact information: [address] [place] [address] [phone]            The results of significant diagnostics from this hospitalization (including imaging, microbiology, ancillary and laboratory) are listed below for reference.    Significant Diagnostic Studies: No results found.  Microbiology: No results found for this or any previous visit (from the past 240 hour(s)).   Labs: Basic Metabolic Panel: Recent Labs  Lab 04/08/17 1714 04/09/17 0552 04/10/17 0330  NA 134* 135 134*  K 4.1 4.1 4.2  CL 100* 99* 98*  CO2 25 26 27   GLUCOSE 230* 199* 166*  BUN 25* 19 14  CREATININE 1.11 0.81 0.87  CALCIUM 9.2 9.3 9.0   Liver Function Tests: Recent Labs  Lab 04/08/17 1714  AST 23   ALT 23  ALKPHOS 64  BILITOT 0.5  PROT 6.8  ALBUMIN 4.0   No results for input(s): LIPASE, AMYLASE in the last 168 hours. No results for input(s): AMMONIA in the last 168 hours. CBC: Recent Labs  Lab 04/08/17 1714 04/09/17 0552 04/09/17 0839 04/10/17 0330 04/11/17 0926  WBC 5.9 6.0  --  5.8 6.3  NEUTROABS 3.4  --   --   --   --   HGB 6.8* 9.2* 8.9* 9.2* 9.4*  HCT 23.4* 30.0* 29.5* 30.1* 30.9*  MCV 72.9* 76.3*  --  75.8* 76.9*  PLT 231 213  --  195 204   Cardiac Enzymes: No results for input(s): CKTOTAL, CKMB, CKMBINDEX, TROPONINI in the last 168 hours. BNP: BNP (last 3 results) No results for input(s): BNP in the last 8760 hours.  ProBNP (last 3 results) No results for input(s): PROBNP in the last 8760 hours.  CBG: Recent Labs  Lab 04/10/17 0819 04/10/17 1209 04/10/17 1707 04/10/17 2118 04/11/17  0739  GLUCAP 181* 175* 312* 223* 213*       Signed:  Zannie CovePreetha Giannamarie Paulus MD.  [facility] 04/11/2017, 11:25 AM

## 2017-04-11 NOTE — Progress Notes (Signed)
Capsule endoscopy was performed on Mr. Randy Jacobson because of iron deficiency anemia without obvious melena or hematochezia. EGD from October 2018 showed H. pylori gastritis and superficial gastric ulcers. Colonoscopy from October 2018 show tubular and serrated adenomas which were subsequently removed.  The entire small bowel mucosa appeared normal on capsule endoscopy. There was no evidence of recent or active bleeding.  Recommendations: Okay to discharge from GI standpoint. Patient advised to continue PPI such as Protonix indefinitely if the plan is to continue antiplatelets. Monitor H&H and transfuse as needed. Iron supplementation.  Patient to follow up with me in the office in a few weeks. Discussed with patient's hospitalist Dr. Jomarie LongsJoseph.  Randy Jacobson, M.D.

## 2017-04-11 NOTE — Progress Notes (Signed)
Patient was discharged home by MD order; discharged instructions review and give to patient with care notes and prescriptions; IV DIC; skin intact; patient will be escorted to the car by nurse tech via wheelchair.  

## 2017-04-11 NOTE — Care Management Note (Signed)
Case Management Note  Patient Details  Name: Randy JoinerJohn T Himebaugh MRN: [id number] Date of Birth: [birth year]  Subjective/Objective:                    Action/Plan: Pt discharging home with self care. Pt has PCP, insurance and transportation home. No further needs per CM.   Expected Discharge Date:  04/11/17               Expected Discharge Plan:  Home/Self Care  In-House Referral:     Discharge planning Services     Post Acute Care Choice:    Choice offered to:     DME Arranged:    DME Agency:     HH Arranged:    HH Agency:     Status of Service:  Completed, signed off  If discussed at [employer] Length of Stay Meetings, dates discussed:    Additional Comments:  Kermit BaloKelli F Shalandra Leu, RN 04/11/2017, 4:10 PM

## 2019-02-01 ENCOUNTER — Encounter (HOSPITAL_COMMUNITY): Payer: Self-pay | Admitting: Emergency Medicine

## 2019-02-01 ENCOUNTER — Other Ambulatory Visit: Payer: Self-pay

## 2019-02-01 ENCOUNTER — Emergency Department (HOSPITAL_COMMUNITY)
Admission: EM | Admit: 2019-02-01 | Discharge: 2019-02-01 | Disposition: A | Discharge status: Home / Self Care | Payer: Medicare Other | Attending: Emergency Medicine | Admitting: Emergency Medicine

## 2019-02-01 DIAGNOSIS — Z79899 Other long term (current) drug therapy: Secondary | ICD-10-CM | POA: Insufficient documentation

## 2019-02-01 DIAGNOSIS — E119 Type 2 diabetes mellitus without complications: Secondary | ICD-10-CM | POA: Insufficient documentation

## 2019-02-01 DIAGNOSIS — Z7984 Long term (current) use of oral hypoglycemic drugs: Secondary | ICD-10-CM | POA: Insufficient documentation

## 2019-02-01 DIAGNOSIS — Z7902 Long term (current) use of antithrombotics/antiplatelets: Secondary | ICD-10-CM | POA: Diagnosis not present

## 2019-02-01 DIAGNOSIS — Z87891 Personal history of nicotine dependence: Secondary | ICD-10-CM | POA: Insufficient documentation

## 2019-02-01 DIAGNOSIS — K922 Gastrointestinal hemorrhage, unspecified: Secondary | ICD-10-CM | POA: Insufficient documentation

## 2019-02-01 DIAGNOSIS — I10 Essential (primary) hypertension: Secondary | ICD-10-CM | POA: Insufficient documentation

## 2019-02-01 LAB — CBC
HCT: 32.5 % — ABNORMAL LOW (ref 39.0–52.0)
Hemoglobin: 11.2 g/dL — ABNORMAL LOW (ref 13.0–17.0)
MCH: 32.2 pg (ref 26.0–34.0)
MCHC: 34.5 g/dL (ref 30.0–36.0)
MCV: 93.4 fL (ref 80.0–100.0)
Platelets: 232 10*3/uL (ref 150–400)
RBC: 3.48 MIL/uL — ABNORMAL LOW (ref 4.22–5.81)
RDW: 13.9 % (ref 11.5–15.5)
WBC: 10.6 10*3/uL — ABNORMAL HIGH (ref 4.0–10.5)
nRBC: 0 % (ref 0.0–0.2)

## 2019-02-01 LAB — COMPREHENSIVE METABOLIC PANEL
ALT: 26 U/L (ref 0–44)
AST: 22 U/L (ref 15–41)
Albumin: 4.1 g/dL (ref 3.5–5.0)
Alkaline Phosphatase: 59 U/L (ref 38–126)
Anion gap: 12 (ref 5–15)
BUN: 21 mg/dL (ref 8–23)
CO2: 24 mmol/L (ref 22–32)
Calcium: 8.9 mg/dL (ref 8.9–10.3)
Chloride: 97 mmol/L — ABNORMAL LOW (ref 98–111)
Creatinine, Ser: 0.92 mg/dL (ref 0.61–1.24)
GFR calc Af Amer: >60 mL/min (ref >60)
GFR calc non Af Amer: >60 mL/min (ref >60)
Glucose, Bld: 256 mg/dL — ABNORMAL HIGH (ref 70–99)
Potassium: 4.3 mmol/L (ref 3.5–5.1)
Sodium: 133 mmol/L — ABNORMAL LOW (ref 135–145)
Total Bilirubin: 0.9 mg/dL (ref 0.3–1.2)
Total Protein: 7.1 g/dL (ref 6.5–8.1)

## 2019-02-01 LAB — TYPE AND SCREEN
ABO/RH(D): O NEG
Antibody Screen: NEGATIVE

## 2019-02-01 LAB — CBG MONITORING, ED: Glucose-Capillary: 250 mg/dL — ABNORMAL HIGH (ref 70–99)

## 2019-02-01 NOTE — ED Triage Notes (Addendum)
Pt in with c/o rectal bleeding BRB x few days. States hx of stomach ulcer and hx of same in past, required blood transfusion. C/o dizziness and "sweats" x few days. States 6 bright red stools in past 2 days.

## 2019-02-01 NOTE — ED Provider Notes (Signed)
[facility] Provider Note   CSN: [id number] Arrival date & time: 02/01/19  1532     History   Chief Complaint Chief Complaint  Patient presents with  . Rectal Bleeding  . Stomach Ulcer    HPI Lurena JoinerJohn T Capri is a 73 y.o. male.      Rectal Bleeding Quality:  Maroon Amount:  Scant Duration:  3 days Timing:  Intermittent Chronicity:  New Context: defecation   Context: not anal fissures, not anal penetration, not foreign body, not hemorrhoids, not rectal injury and not rectal pain   Similar prior episodes: no   Relieved by:  None tried Worsened by:  Nothing Ineffective treatments:  None tried Associated symptoms: no abdominal pain, no dizziness, no epistaxis, no fever, no hematemesis, no light-headedness, no loss of consciousness, no recent illness and no vomiting   Risk factors: no hx of colorectal cancer, no hx of colorectal surgery, no NSAID use and no steroid use     Past Medical History:  Diagnosis Date  . Diabetes mellitus without complication (HCC)   . Hypertension     Patient Active Problem List   Diagnosis Date Noted  . GI bleeding 04/08/2017  . Diabetes mellitus, type 2 (HCC) 12/16/2015  . Sepsis (HCC) 12/16/2015  . Hyperlipidemia 12/16/2015  . Hypertension 12/16/2015  . Diarrhea   . Febrile illness   . Enteric campylobacteriosis   . Claudication of right lower extremity (HCC)   . PAD (peripheral artery disease) (HCC)     Past Surgical History:  Procedure Laterality Date  . "stents in both my legs"    . GIVENS CAPSULE STUDY N/A 04/10/2017   Procedure: GIVENS CAPSULE STUDY;  Surgeon: Charlott RakesSchooler, Vincent, MD;  [facility];  Service: Endoscopy;  Laterality: N/A;  . IR GENERIC HISTORICAL  08/26/2015   IR RADIOLOGIST EVAL & MGMT 08/26/2015 Malachy MoanHeath McCullough, MD [facility] INTERV RAD  . IR GENERIC HISTORICAL  04/21/2016   IR RADIOLOGIST EVAL & MGMT 04/21/2016 Malachy MoanHeath McCullough, MD [facility] INTERV RAD         Home Medications    Prior to Admission medications   Medication Sig Start Date End Date Taking? Authorizing Provider  amitriptyline (ELAVIL) 10 MG tablet Take 10 mg by mouth daily. 11/20/15  Yes [provider]  clopidogrel (PLAVIX) 75 MG tablet Take 75 mg by mouth daily. 10/30/15  Yes [provider]  diphenhydramine-acetaminophen (TYLENOL PM) 25-500 MG TABS tablet Take 1 tablet by mouth at bedtime as needed (For Pain).   Yes [provider]  glimepiride (AMARYL) 4 MG tablet Take 4 mg by mouth 2 (two) times daily.  09/08/15  Yes [provider]  hydrochlorothiazide (HYDRODIURIL) 25 MG tablet Take 25 mg by mouth daily. 02/03/17  Yes [provider]  JANUVIA 100 MG tablet Take 100 mg by mouth daily. 03/18/17  Yes [provider]  losartan (COZAAR) 100 MG tablet Take 100 mg by mouth daily.   Yes [provider]  metFORMIN (GLUCOPHAGE) 1000 MG tablet Take 1,000 mg by mouth 2 (two) times daily with a meal.   Yes [provider]  Multiple Vitamin (MULTIVITAMIN WITH MINERALS) TABS tablet Take 1 tablet by mouth daily.   Yes [provider]  pantoprazole (PROTONIX) 40 MG tablet Take 1 tablet (40 mg total) by mouth daily. Please start this after your packet of PREVPAC is complete to prevent duplication Patient taking differently: Take 40 mg by mouth daily.  04/11/17  Yes Zannie CoveJoseph, Preetha, MD  simvastatin (ZOCOR) 40 MG tablet Take 40 mg by mouth every evening.    Yes [provider]    Family History Family History  Problem Relation Age of Onset  . Diabetes Brother     Social History Social History   Tobacco Use  . Smoking status: Former Smoker    Packs/day: 1.00    Types: Cigarettes    Start date: 11/20/1957    Quit date: 11/20/1993    Years since quitting: 25.2  . Smokeless tobacco: Never Used  Substance Use Topics  . Alcohol use: Yes    Alcohol/week: 0.0 standard drinks    Comment: Beer 12 pack/week      . Drug use: No    Comment: Hx of marijuana use        Allergies   Patient has no known allergies.   Review of Systems Review of Systems  Constitutional: Negative for chills and fever.  HENT: Negative for ear pain, nosebleeds and sore throat.   Eyes: Negative for pain and visual disturbance.  Respiratory: Negative for cough and shortness of breath.   Cardiovascular: Negative for chest pain and palpitations.  Gastrointestinal: Positive for anal bleeding, blood in stool and hematochezia. Negative for abdominal distention, abdominal pain, constipation, diarrhea, hematemesis, nausea, rectal pain and vomiting.  Genitourinary: Negative for dysuria and hematuria.  Musculoskeletal: Negative for arthralgias and back pain.  Skin: Negative for color change and rash.  Neurological: Negative for dizziness, seizures, loss of consciousness, syncope and light-headedness.  All other systems reviewed and are negative.    Physical Exam Updated Vital Signs BP (!) 137/57   Pulse 78   Temp 97.7 F (36.5 C) (Oral)   Resp 14   Wt 78.9 kg   SpO2 98%   BMI 24.26 kg/m   Physical Exam Vitals signs and nursing note reviewed.  Constitutional:      Appearance: He is well-developed. He is not toxic-appearing.  HENT:     Head: Normocephalic and atraumatic.  Eyes:     Conjunctiva/sclera: Conjunctivae normal.  Neck:     Musculoskeletal: Normal range of motion and neck supple.  Cardiovascular:     Rate and Rhythm: Normal rate and regular rhythm.     Heart sounds: No murmur.  Pulmonary:     Effort: Pulmonary effort is normal. No respiratory distress.     Breath sounds: Normal breath sounds.  Abdominal:     General: Abdomen is flat. Bowel sounds are normal. There is no distension.     Palpations: Abdomen is soft. There is no mass.     Tenderness: There is no abdominal tenderness. There is no right CVA tenderness, left CVA tenderness, guarding or rebound.     Hernia: No hernia is present.   Genitourinary:    Penis: Normal.      Scrotum/Testes: Normal.     Rectum: Normal.     Comments: Scant dark red blood mixed with stool in the rectal vault.  No external hemorrhoids or fissure noted. No internal hemorrhoids palpated. Skin:    General: Skin is warm and dry.  Neurological:     Mental Status: He is alert.      ED Treatments / Results  Labs (all labs ordered are listed, but only abnormal results are displayed) Labs Reviewed  COMPREHENSIVE METABOLIC PANEL - Abnormal; Notable for the following components:      Result Value   Sodium 133 (*)    Chloride 97 (*)    Glucose, Bld 256 (*)  All other components within normal limits  CBC - Abnormal; Notable for the following components:   WBC 10.6 (*)    RBC 3.48 (*)    Hemoglobin 11.2 (*)    HCT 32.5 (*)    All other components within normal limits  CBG MONITORING, ED - Abnormal; Notable for the following components:   Glucose-Capillary 250 (*)    All other components within normal limits  TYPE AND SCREEN    EKG EKG Interpretation  Date/Time:  Thursday February 01 2019 16:54:27 EDT Ventricular Rate:  79 PR Interval:  178 QRS Duration: 150 QT Interval:  428 QTC Calculation: 490 R Axis:   85 Text Interpretation:  Normal sinus rhythm Right bundle branch block Abnormal ECG No significant change since last tracing Confirmed by Alvira Monday ([id number]) on 02/01/2019 7:24:37 PM   Radiology No results found.  Procedures Procedures (including critical care time)  Medications Ordered in ED Medications - No data to display   Initial Impression / Assessment and Plan / ED Course  I have reviewed the triage vital signs and the nursing notes.  Pertinent labs & imaging results that were available during my care of the patient were reviewed by me and considered in my medical decision making (see chart for details).        Patient is a 73 year old male with history and physical exam as above.  He presents for  rectal bleeding with bowel movement.  Denies any abdominal pain at all.  No nausea or vomiting.  No infectious symptoms.  Patient has a history of anemia in the past however his hemoglobin is higher than it has been previously at 11.2.  Patient states that this has been going on for the past 3 days and has not been worsening.  Rectal exam was performed which demonstrated a scant amount of dark red blood in the rectal vault no perirectal fissure or external hemorrhoids identified. No signs of symptomatic anemia. This bleeding could be coming from the patient's colon representing mild diverticulosis, could be related to although patient has had a recent colonoscopy without overtly concerning findings.  This also may be a result of bleeding from a that was not visualized on exam.  Regardless I feel that patient is stable for outpatient follow-up at this time.  I discussed the findings with patient who verbalized understanding and agreement with the plan.  He will follow-up with his primary care physician as well as with gastroenterology as soon as possible.  Patient was discharged in stable condition.  Patient was seen in conjunction with the attending physician of record for this patient.  Final Clinical Impressions(s) / ED Diagnoses   Final diagnoses:  Lower GI bleed    ED Discharge Orders    None       Jonna Clark, MD 02/02/19 1431    Alvira Monday, MD 02/03/19 1244

## 2019-02-01 NOTE — ED Notes (Signed)
Pt d/c home. avs given opportunity for questions provided and answered with teachback. 

## 2019-05-21 ENCOUNTER — Encounter (HOSPITAL_COMMUNITY): Payer: Self-pay | Admitting: Emergency Medicine

## 2019-05-21 ENCOUNTER — Emergency Department (HOSPITAL_COMMUNITY)
Admission: EM | Admit: 2019-05-21 | Discharge: 2019-05-21 | Disposition: A | Discharge status: Home / Self Care | Payer: Medicare Other | Attending: Emergency Medicine | Admitting: Emergency Medicine

## 2019-05-21 ENCOUNTER — Other Ambulatory Visit: Payer: Self-pay

## 2019-05-21 ENCOUNTER — Ambulatory Visit
Admission: EM | Admit: 2019-05-21 | Discharge: 2019-05-21 | Disposition: A | Discharge status: Home / Self Care | Payer: Medicare Other | Source: Home / Self Care

## 2019-05-21 DIAGNOSIS — Z20822 Contact with and (suspected) exposure to covid-19: Secondary | ICD-10-CM | POA: Diagnosis not present

## 2019-05-21 DIAGNOSIS — R739 Hyperglycemia, unspecified: Secondary | ICD-10-CM | POA: Diagnosis present

## 2019-05-21 DIAGNOSIS — Z5321 Procedure and treatment not carried out due to patient leaving prior to being seen by health care provider: Secondary | ICD-10-CM | POA: Insufficient documentation

## 2019-05-21 DIAGNOSIS — E1165 Type 2 diabetes mellitus with hyperglycemia: Secondary | ICD-10-CM | POA: Diagnosis not present

## 2019-05-21 LAB — POCT FASTING CBG KUC MANUAL ENTRY: POCT Glucose (KUC): 353 mg/dL — AB (ref 70–99)

## 2019-05-21 LAB — CBG MONITORING, ED: Glucose-Capillary: 214 mg/dL — ABNORMAL HIGH (ref 70–99)

## 2019-05-21 NOTE — ED Triage Notes (Signed)
Pt reports feeling "blah" since yesterday and blood sugar in 300s.  Reports ate spicy food last night and started having some indigestion but went away after taking 6 advil last night.  Informed pt that he should not take 6 advil at once.    Pt also says his bp was 109/20's this morning.

## 2019-05-21 NOTE — Discharge Instructions (Addendum)
POCT CBGs was 353 CBC, CMP, hemoglobin A1c were ordered Advised patient to monitor blood sugar at home Advised patient to take diabetes medication as prescribed To go to ED if blood sugar continue to increase or any worsening of symptoms Advised patient to call and set up an appointment with Dr. Margo Aye

## 2019-05-21 NOTE — ED Provider Notes (Signed)
[facility]    CSN: [id number] Arrival date & time: 05/21/19  1100      History   Chief Complaint Chief Complaint  Patient presents with  . Hyperglycemia    HPI Randy Jacobson is a 74 y.o. male.   Who presented to the urgent care with a complaint of blood sugar over 300.  He states his primary care provider is retiring and is in the process of establishing care with a new provider.  He reports no precipitating event.  Denies any symptom.  Denies exposure to Covid.  Denies confusion, agitation, chill, fever, nausea, vomiting, diarrhea, chest pain and chest tightness.  The history is provided by the patient. No language interpreter was used.    Past Medical History:  Diagnosis Date  . Diabetes mellitus without complication (HCC)   . Hypertension     Patient Active Problem List   Diagnosis Date Noted  . GI bleeding 04/08/2017  . Diabetes mellitus, type 2 (HCC) 12/16/2015  . Sepsis (HCC) 12/16/2015  . Hyperlipidemia 12/16/2015  . Hypertension 12/16/2015  . Diarrhea   . Febrile illness   . Enteric campylobacteriosis   . Claudication of right lower extremity (HCC)   . PAD (peripheral artery disease) (HCC)     Past Surgical History:  Procedure Laterality Date  . "stents in both my legs"    . GIVENS CAPSULE STUDY N/A 04/10/2017   Procedure: GIVENS CAPSULE STUDY;  Surgeon: Charlott Rakes, MD;  Location: [facility] ENDOSCOPY;  Service: Endoscopy;  Laterality: N/A;  . IR GENERIC HISTORICAL  08/26/2015   IR RADIOLOGIST EVAL & MGMT 08/26/2015 Malachy Moan, MD [facility] INTERV RAD  . IR GENERIC HISTORICAL  04/21/2016   IR RADIOLOGIST EVAL & MGMT 04/21/2016 Malachy Moan, MD [facility] INTERV RAD       Home Medications    Prior to Admission medications   Medication Sig Start Date End Date Taking? Authorizing Provider  amitriptyline (ELAVIL) 10 MG tablet Take 10 mg by mouth daily. 11/20/15  Yes [provider]  clopidogrel (PLAVIX) 75 MG tablet Take 75 mg by  mouth daily. 10/30/15  Yes [provider]  diphenhydramine-acetaminophen (TYLENOL PM) 25-500 MG TABS tablet Take 1 tablet by mouth at bedtime as needed (For Pain).   Yes [provider]  glimepiride (AMARYL) 4 MG tablet Take 4 mg by mouth 2 (two) times daily.  09/08/15  Yes [provider]  hydrochlorothiazide (HYDRODIURIL) 25 MG tablet Take 25 mg by mouth daily. 02/03/17  Yes [provider]  JANUVIA 100 MG tablet Take 100 mg by mouth daily. 03/18/17  Yes [provider]  losartan (COZAAR) 100 MG tablet Take 100 mg by mouth daily.   Yes [provider]  metFORMIN (GLUCOPHAGE) 1000 MG tablet Take 1,000 mg by mouth 2 (two) times daily with a meal.   Yes [provider]  Multiple Vitamin (MULTIVITAMIN WITH MINERALS) TABS tablet Take 1 tablet by mouth daily.   Yes [provider]  pantoprazole (PROTONIX) 40 MG tablet Take 1 tablet (40 mg total) by mouth daily. Please start this after your packet of PREVPAC is complete to prevent duplication Patient taking differently: Take 40 mg by mouth daily.  04/11/17  Yes Zannie Cove, MD  simvastatin (ZOCOR) 40 MG tablet Take 40 mg by mouth every evening.    Yes [provider]    Family History Family History  Problem Relation Age of Onset  . Diabetes Brother     Social History Social History  Tobacco Use  . Smoking status: Former Smoker    Packs/day: 1.00    Types: Cigarettes    Start date: 11/20/1957    Quit date: 11/20/1993    Years since quitting: 25.5  . Smokeless tobacco: Never Used  Substance Use Topics  . Alcohol use: Yes    Alcohol/week: 0.0 standard drinks    Comment: occ  . Drug use: Never     Allergies   Patient has no known allergies.   Review of Systems Review of Systems  Constitutional: Negative.   HENT: Negative.   Respiratory: Negative.   Cardiovascular: Negative.   Neurological: Negative.   All other systems reviewed and are  negative.    Physical Exam Triage Vital Signs ED Triage Vitals [05/21/19 1114]  Enc Vitals Group     BP 130/79     Pulse Rate (!) 105     Resp 18     Temp 98.6 F (37 C)     Temp Source Oral     SpO2 95 %     Weight 165 lb (74.8 kg)     Height 5\' 11"  (1.803 m)     Head Circumference      Peak Flow      Pain Score 0     Pain Loc      Pain Edu?      Excl. in GC?    No data found.  Updated Vital Signs BP 130/79 (BP Location: Right Arm)   Pulse (!) 105   Temp 98.6 F (37 C) (Oral)   Resp 18   Ht 5\' 11"  (1.803 m)   Wt 165 lb (74.8 kg)   SpO2 95%   BMI 23.01 kg/m   Visual Acuity Right Eye Distance:   Left Eye Distance:   Bilateral Distance:    Right Eye Near:   Left Eye Near:    Bilateral Near:     Physical Exam Vitals and nursing note reviewed.  Constitutional:      General: He is not in acute distress.    Appearance: Normal appearance. He is normal weight. He is not ill-appearing or toxic-appearing.  Cardiovascular:     Rate and Rhythm: Normal rate and regular rhythm.     Pulses: Normal pulses.     Heart sounds: Normal heart sounds.  Pulmonary:     Effort: Pulmonary effort is normal. No respiratory distress.     Breath sounds: Normal breath sounds. No wheezing or rales.  Chest:     Chest wall: No tenderness.  Neurological:     General: No focal deficit present.     Mental Status: He is alert and oriented to person, place, and time.     Cranial Nerves: Cranial nerves are intact. No cranial nerve deficit.     Sensory: No sensory deficit.     Motor: Motor function is intact. No weakness.     Coordination: Coordination is intact. Coordination normal.     Gait: Gait is intact. Gait normal.      UC Treatments / Results  Labs (all labs ordered are listed, but only abnormal results are displayed) Labs Reviewed  POCT FASTING CBG [facility] MANUAL ENTRY - Abnormal; Notable for the following components:      Result Value   POCT Glucose ([facility]) 353 (*)    All  other components within normal limits  NOVEL CORONAVIRUS, NAA  CBC WITH DIFFERENTIAL/PLATELET  COMPREHENSIVE METABOLIC PANEL  HEMOGLOBIN A1C    EKG   Radiology No results  found.  Procedures Procedures (including critical care time)  Medications Ordered in UC Medications - No data to display  Initial Impression / Assessment and Plan / UC Course  I have reviewed the triage vital signs and the nursing notes.  Pertinent labs & imaging results that were available during my care of the patient were reviewed by me and considered in my medical decision making (see chart for details).   Patient stable for discharge. POCT CBG is 253 CBC CMP and hemoglobin A1c were ordered Advised patient to go to ED for worsening of symptoms COVID-19 test was ordered for rule out  Final Clinical Impressions(s) / UC Diagnoses   Final diagnoses:  Uncontrolled type 2 diabetes mellitus with hyperglycemia ([place])  COVID-19 ruled out     Discharge Instructions     POCT CBGs was 353 CBC, CMP, hemoglobin A1c were ordered Advised patient to monitor blood sugar at home Advised patient to take diabetes medication as prescribed To go to ED if blood sugar continue to increase or any worsening of symptoms Advised patient to call and set up an appointment with Dr. Nevada Crane    ED Prescriptions    None     PDMP not reviewed this encounter.   Emerson Monte, FNP 05/21/19 [phone]

## 2019-05-21 NOTE — ED Triage Notes (Addendum)
Pt c/o hyperglycemia since yesterday. Pt states the his last CBG was 327. Pt's CBG in triage is 214.

## 2019-05-22 LAB — CBC WITH DIFFERENTIAL/PLATELET
Basophils Absolute: 0.1 10*3/uL (ref 0.0–0.2)
Basos: 0 %
EOS (ABSOLUTE): 0 10*3/uL (ref 0.0–0.4)
Eos: 0 %
Hematocrit: 29.3 % — ABNORMAL LOW (ref 37.5–51.0)
Hemoglobin: 9.4 g/dL — ABNORMAL LOW (ref 13.0–17.7)
Immature Grans (Abs): 0.1 10*3/uL (ref 0.0–0.1)
Immature Granulocytes: 1 %
Lymphocytes Absolute: 0.5 10*3/uL — ABNORMAL LOW (ref 0.7–3.1)
Lymphs: 2 %
MCH: 23.4 pg — ABNORMAL LOW (ref 26.6–33.0)
MCHC: 32.1 g/dL (ref 31.5–35.7)
MCV: 73 fL — ABNORMAL LOW (ref 79–97)
Monocytes Absolute: 2.9 10*3/uL — ABNORMAL HIGH (ref 0.1–0.9)
Monocytes: 13 %
Neutrophils Absolute: 18.6 10*3/uL — ABNORMAL HIGH (ref 1.4–7.0)
Neutrophils: 84 %
Platelets: 245 10*3/uL (ref 150–450)
RBC: 4.02 x10E6/uL — ABNORMAL LOW (ref 4.14–5.80)
RDW: 15.7 % — ABNORMAL HIGH (ref 11.6–15.4)
WBC: 22.2 10*3/uL (ref 3.4–10.8)

## 2019-05-22 LAB — COMPREHENSIVE METABOLIC PANEL
ALT: 21 IU/L (ref 0–44)
AST: 18 IU/L (ref 0–40)
Albumin/Globulin Ratio: 1.7 (ref 1.2–2.2)
Albumin: 4.2 g/dL (ref 3.7–4.7)
Alkaline Phosphatase: 85 IU/L (ref 39–117)
BUN/Creatinine Ratio: 20 (ref 10–24)
BUN: 18 mg/dL (ref 8–27)
Bilirubin Total: 0.7 mg/dL (ref 0.0–1.2)
CO2: 20 mmol/L (ref 20–29)
Calcium: 8.4 mg/dL — ABNORMAL LOW (ref 8.6–10.2)
Chloride: 93 mmol/L — ABNORMAL LOW (ref 96–106)
Creatinine, Ser: 0.92 mg/dL (ref 0.76–1.27)
GFR calc Af Amer: 94 mL/min/{1.73_m2} (ref >59)
GFR calc non Af Amer: 81 mL/min/{1.73_m2} (ref >59)
Globulin, Total: 2.5 g/dL (ref 1.5–4.5)
Glucose: 340 mg/dL — ABNORMAL HIGH (ref 65–99)
Potassium: 4.4 mmol/L (ref 3.5–5.2)
Sodium: 132 mmol/L — ABNORMAL LOW (ref 134–144)
Total Protein: 6.7 g/dL (ref 6.0–8.5)

## 2019-05-22 LAB — HEMOGLOBIN A1C
Est. average glucose Bld gHb Est-mCnc: 214 mg/dL
Hgb A1c MFr Bld: 9.1 % — ABNORMAL HIGH (ref 4.8–5.6)

## 2019-05-22 LAB — NOVEL CORONAVIRUS, NAA: SARS-CoV-2, NAA: NOT DETECTED

## 2019-05-23 ENCOUNTER — Telehealth (HOSPITAL_COMMUNITY): Payer: Self-pay | Admitting: Emergency Medicine

## 2019-05-23 NOTE — Telephone Encounter (Signed)
Able to reach patient by phone. Pt states he saw his PCP today and his PCP is aware of his WBC level and A1C. Pt states he feels fine right now, no symptoms. Per pt, he had repeat lab work done at the office today for recheck of WBC. Pt instructed by PCP to return in 2 weeks for recheck. Pt instructed that if anything changes, if any symptoms return, to follow up in the ER. Pt verbalized understanding, is being monitored by PCP for follow up. All questions answered.

## 2019-05-23 NOTE — Telephone Encounter (Signed)
Reviewed labs with Dr. Tracie Harrier, if pt has symptoms he needs evaluation in the ER due to high WBC count. Attempted to reach patient on all numbers, including emergency contact. No answer. Left VM on all of them.

## 2019-06-08 ENCOUNTER — Encounter (HOSPITAL_COMMUNITY): Payer: Self-pay

## 2019-06-08 ENCOUNTER — Encounter (HOSPITAL_COMMUNITY)
Admission: RE | Admit: 2019-06-08 | Discharge: 2019-06-08 | Disposition: A | Discharge status: Home / Self Care | Payer: Medicare Other | Source: Ambulatory Visit | Attending: Adult Health Nurse Practitioner | Admitting: Adult Health Nurse Practitioner

## 2019-06-08 ENCOUNTER — Other Ambulatory Visit: Payer: Self-pay

## 2019-06-08 DIAGNOSIS — D509 Iron deficiency anemia, unspecified: Secondary | ICD-10-CM | POA: Diagnosis not present

## 2019-06-08 MED ORDER — SODIUM CHLORIDE 0.9 % IV SOLN: Freq: Once | INTRAVENOUS | Status: AC

## 2019-06-08 MED ORDER — SODIUM CHLORIDE 0.9 % IV SOLN
510.0000 mg | Freq: Once | INTRAVENOUS | Status: AC
  Administered 2019-06-08: 510 mg via INTRAVENOUS
  Filled 2019-06-08: qty 17

## 2019-06-10 ENCOUNTER — Ambulatory Visit: Payer: Medicare Other | Attending: Internal Medicine

## 2019-06-10 DIAGNOSIS — Z23 Encounter for immunization: Secondary | ICD-10-CM | POA: Insufficient documentation

## 2019-06-10 NOTE — Progress Notes (Signed)
   Covid-19 Vaccination Clinic  Name:  DAINEL ARCIDIACONO    MRN: [id number] DOB: 1945-07-08  06/10/2019  Mr. Gilles was observed post Covid-19 immunization for 15 minutes without incidence. He was provided with Vaccine Information Sheet and instruction to access the V-Safe system.   Mr. Fell was instructed to call 911 with any severe reactions post vaccine: Marland Kitchen Difficulty breathing  . Swelling of your face and throat  . A fast heartbeat  . A bad rash all over your body  . Dizziness and weakness    Immunizations Administered    Name Date Dose VIS Date Route   Pfizer COVID-19 Vaccine 06/10/2019 12:49 PM 0.3 mL 03/23/2019 Intramuscular   Manufacturer: [employer], [employer]   Lot: [id number]   NDC: [address]-8

## 2019-06-15 ENCOUNTER — Other Ambulatory Visit: Payer: Self-pay

## 2019-06-15 ENCOUNTER — Encounter (HOSPITAL_COMMUNITY): Payer: Self-pay

## 2019-06-15 ENCOUNTER — Encounter (HOSPITAL_COMMUNITY)
Admission: RE | Admit: 2019-06-15 | Discharge: 2019-06-15 | Disposition: A | Discharge status: Home / Self Care | Payer: Medicare Other | Source: Ambulatory Visit | Attending: Adult Health Nurse Practitioner | Admitting: Adult Health Nurse Practitioner

## 2019-06-15 DIAGNOSIS — D509 Iron deficiency anemia, unspecified: Secondary | ICD-10-CM | POA: Insufficient documentation

## 2019-06-15 MED ORDER — SODIUM CHLORIDE 0.9 % IV SOLN: Freq: Once | INTRAVENOUS | Status: AC

## 2019-06-15 MED ORDER — SODIUM CHLORIDE 0.9 % IV SOLN
510.0000 mg | Freq: Once | INTRAVENOUS | Status: AC
  Administered 2019-06-15: 510 mg via INTRAVENOUS
  Filled 2019-06-15: qty 510

## 2019-07-10 ENCOUNTER — Ambulatory Visit: Payer: Medicare Other | Attending: Internal Medicine

## 2019-07-10 DIAGNOSIS — Z23 Encounter for immunization: Secondary | ICD-10-CM

## 2019-07-10 NOTE — Progress Notes (Signed)
   Covid-19 Vaccination Clinic  Name:  Randy Jacobson    MRN: [id number] DOB: 09-19-1945  07/10/2019  [name] was observed post Covid-19 immunization for 15 minutes without incident. He was provided with Vaccine Information Sheet and instruction to access the V-Safe system.   [name] was instructed to call 911 with any severe reactions post vaccine: Marland Kitchen Difficulty breathing  . Swelling of face and throat  . A fast heartbeat  . A bad rash all over body  . Dizziness and weakness   Immunizations Administered    Name Date Dose VIS Date Route   Pfizer COVID-19 Vaccine 07/10/2019  8:25 AM 0.3 mL 03/23/2019 Intramuscular   Manufacturer: [employer], [employer]   Lot: [id number]   NDC: [address]-4

## 2019-10-05 ENCOUNTER — Ambulatory Visit (HOSPITAL_COMMUNITY)
Admission: RE | Admit: 2019-10-05 | Discharge: 2019-10-05 | Disposition: A | Discharge status: Home / Self Care | Payer: Medicare Other | Source: Ambulatory Visit | Attending: Family Medicine | Admitting: Family Medicine

## 2019-10-05 ENCOUNTER — Other Ambulatory Visit: Payer: Self-pay

## 2019-10-05 ENCOUNTER — Other Ambulatory Visit (HOSPITAL_COMMUNITY): Payer: Self-pay | Admitting: Family Medicine

## 2019-10-05 DIAGNOSIS — R0782 Intercostal pain: Secondary | ICD-10-CM | POA: Diagnosis present

## 2020-09-01 DIAGNOSIS — E1165 Type 2 diabetes mellitus with hyperglycemia: Secondary | ICD-10-CM | POA: Insufficient documentation

## 2020-09-04 DIAGNOSIS — G4700 Insomnia, unspecified: Secondary | ICD-10-CM | POA: Insufficient documentation

## 2020-09-04 DIAGNOSIS — R809 Proteinuria, unspecified: Secondary | ICD-10-CM | POA: Insufficient documentation

## 2020-09-04 DIAGNOSIS — E611 Iron deficiency: Secondary | ICD-10-CM | POA: Insufficient documentation

## 2020-09-04 DIAGNOSIS — D729 Disorder of white blood cells, unspecified: Secondary | ICD-10-CM | POA: Insufficient documentation

## 2020-09-04 DIAGNOSIS — Z8711 Personal history of peptic ulcer disease: Secondary | ICD-10-CM | POA: Insufficient documentation

## 2020-12-29 ENCOUNTER — Ambulatory Visit: Payer: Medicare Other | Admitting: Dermatology

## 2021-01-13 DIAGNOSIS — R059 Cough, unspecified: Secondary | ICD-10-CM | POA: Insufficient documentation

## 2021-03-03 IMAGING — DX DG CHEST 2V
2 series · 2 of 2 positions shown · non-contrast
Comparison: 11/12/2016

CLINICAL DATA: Recent fall several days ago with left-sided chest
pain, initial encounter

EXAM:
CHEST - 2 VIEW

[chest pa]
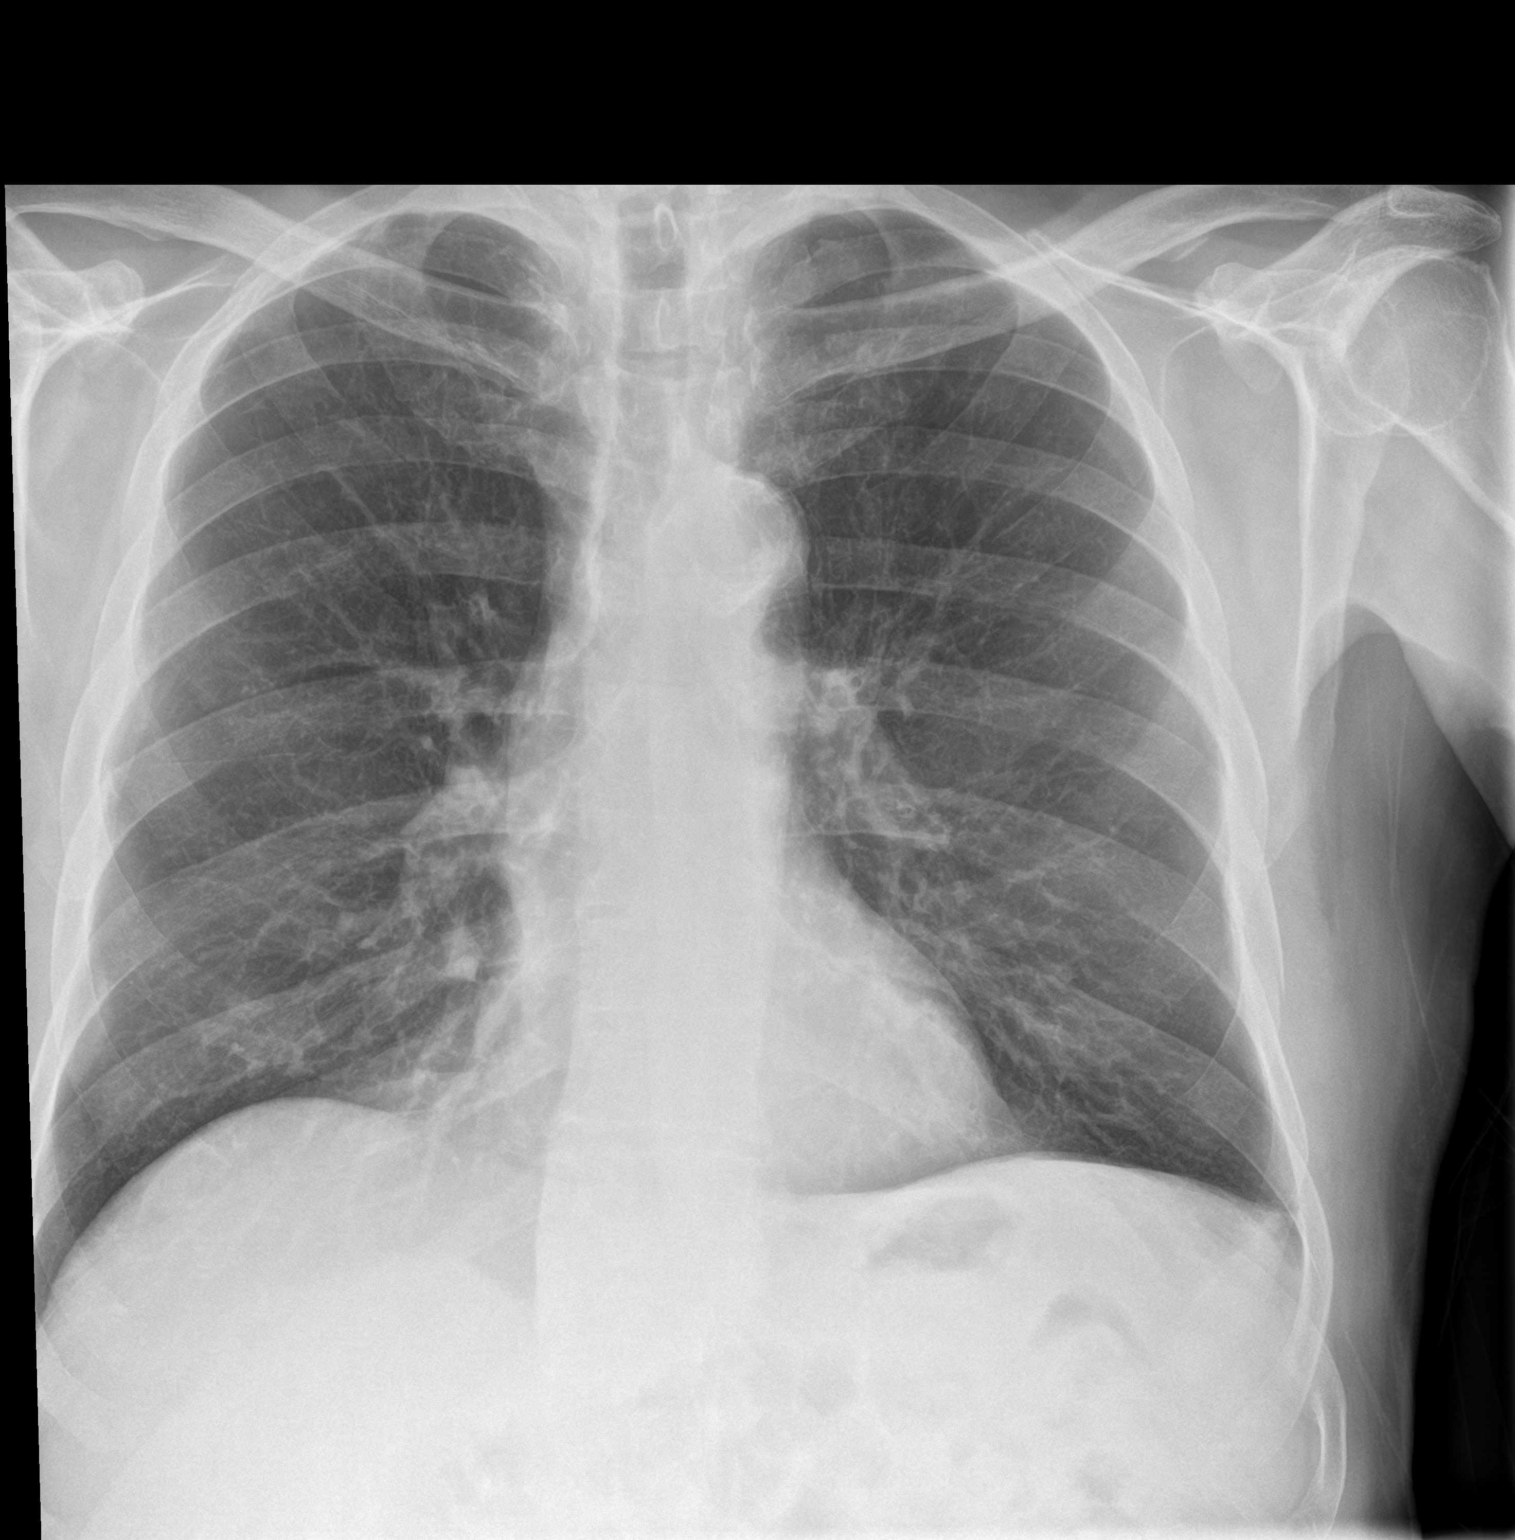

[chest lat]
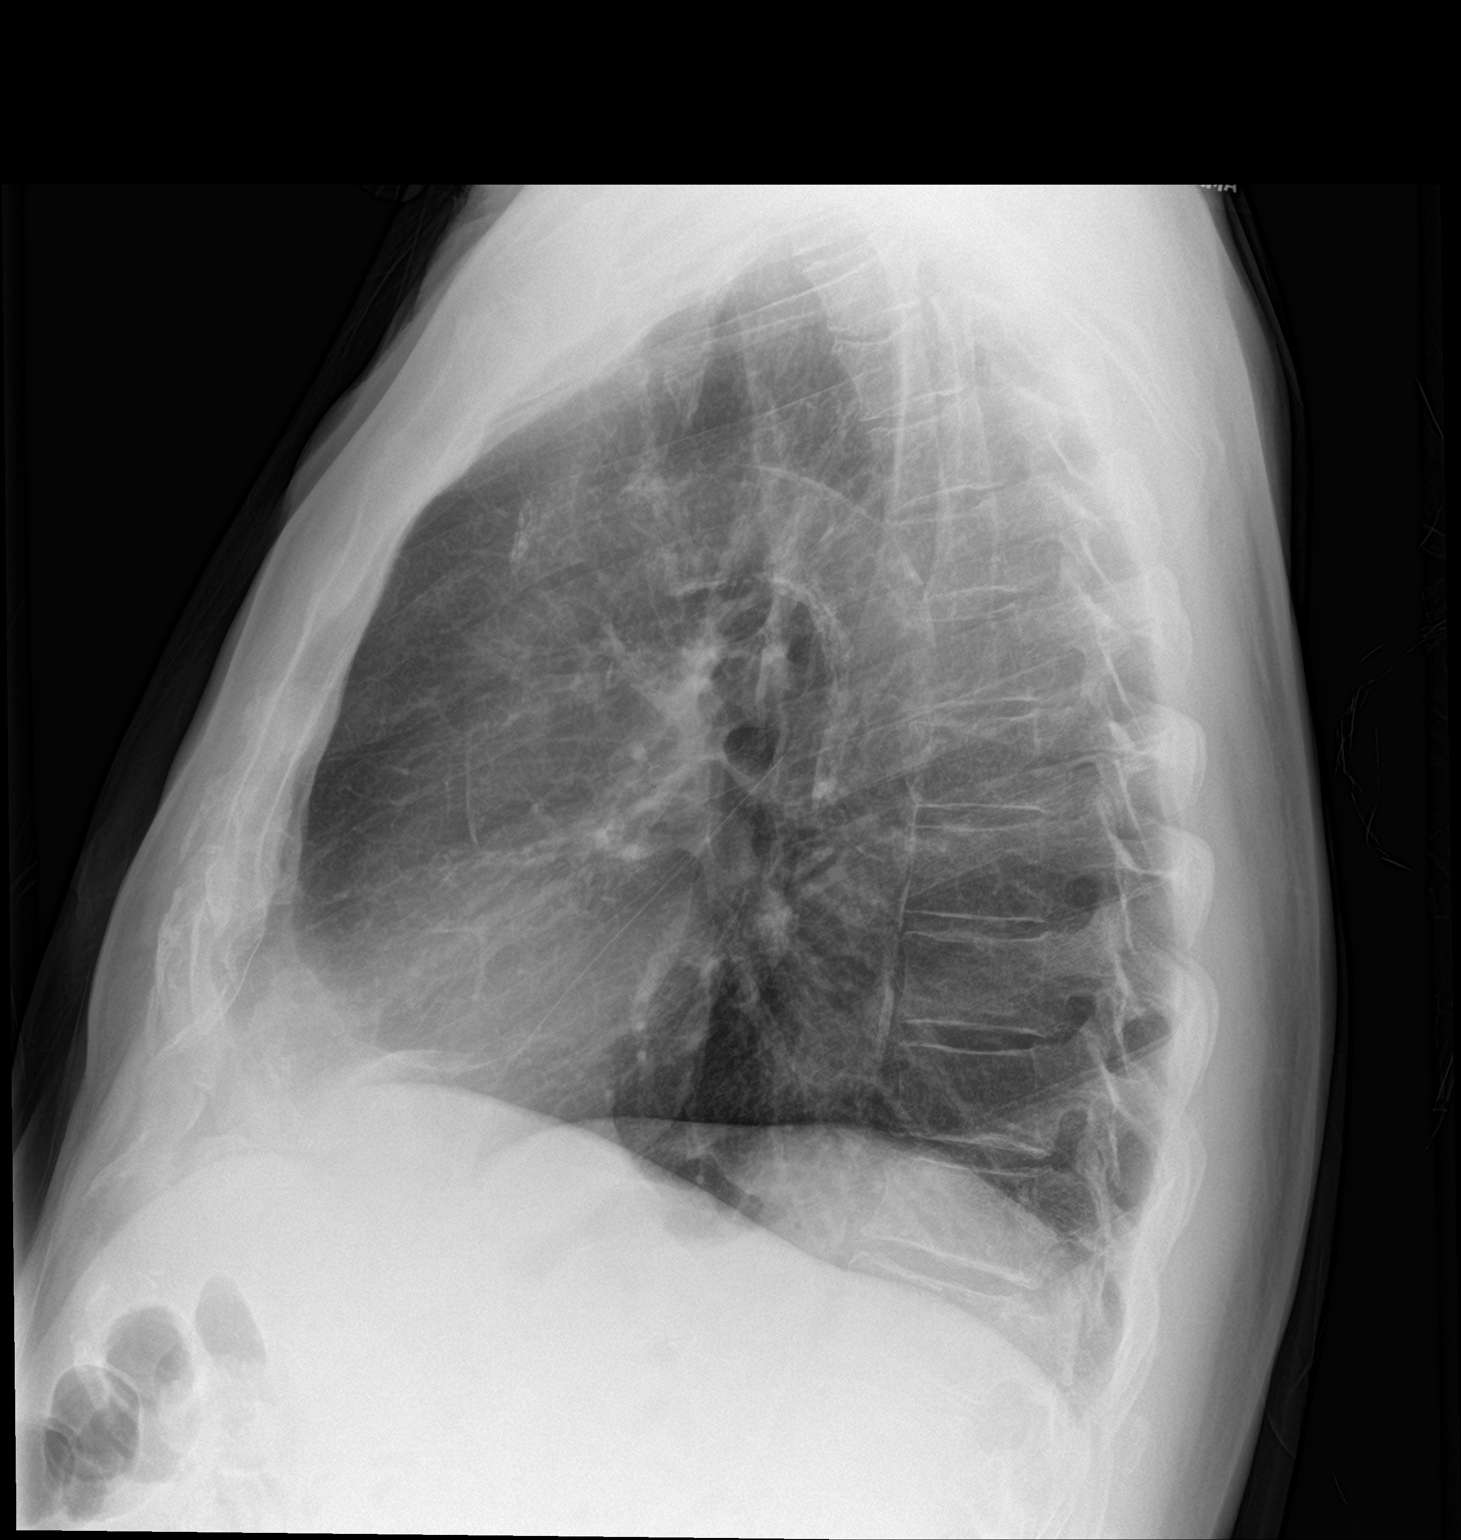

[2 of 2 positions shown; findings below may reference images not displayed]

FINDINGS: Cardiac shadow is within normal limits. Aortic calcifications are
again seen. The lungs are well aerated bilaterally. No focal
infiltrate or sizable effusion is seen. There is an undisplaced
fracture of the left seventh rib laterally. No pneumothorax is
noted.
IMPRESSION: Left seventh rib fracture.  No pneumothorax is noted.

## 2021-03-24 ENCOUNTER — Ambulatory Visit (HOSPITAL_COMMUNITY)
Admission: RE | Admit: 2021-03-24 | Discharge: 2021-03-24 | Disposition: A | Discharge status: Home / Self Care | Payer: Medicare Other | Source: Ambulatory Visit | Attending: Nurse Practitioner | Admitting: Nurse Practitioner

## 2021-03-24 ENCOUNTER — Other Ambulatory Visit (HOSPITAL_COMMUNITY): Payer: Self-pay | Admitting: Nurse Practitioner

## 2021-03-24 ENCOUNTER — Other Ambulatory Visit: Payer: Self-pay

## 2021-03-24 DIAGNOSIS — R0600 Dyspnea, unspecified: Secondary | ICD-10-CM

## 2021-05-13 DIAGNOSIS — M25551 Pain in right hip: Secondary | ICD-10-CM | POA: Diagnosis not present

## 2021-06-04 DIAGNOSIS — E119 Type 2 diabetes mellitus without complications: Secondary | ICD-10-CM | POA: Diagnosis not present

## 2021-06-04 DIAGNOSIS — Z794 Long term (current) use of insulin: Secondary | ICD-10-CM | POA: Diagnosis not present

## 2021-06-15 DIAGNOSIS — M25551 Pain in right hip: Secondary | ICD-10-CM | POA: Diagnosis not present

## 2021-07-04 DIAGNOSIS — E119 Type 2 diabetes mellitus without complications: Secondary | ICD-10-CM | POA: Diagnosis not present

## 2021-07-04 DIAGNOSIS — Z794 Long term (current) use of insulin: Secondary | ICD-10-CM | POA: Diagnosis not present

## 2021-07-06 DIAGNOSIS — E782 Mixed hyperlipidemia: Secondary | ICD-10-CM | POA: Diagnosis not present

## 2021-07-06 DIAGNOSIS — R809 Proteinuria, unspecified: Secondary | ICD-10-CM | POA: Diagnosis not present

## 2021-07-06 DIAGNOSIS — E1165 Type 2 diabetes mellitus with hyperglycemia: Secondary | ICD-10-CM | POA: Diagnosis not present

## 2021-07-13 DIAGNOSIS — E611 Iron deficiency: Secondary | ICD-10-CM | POA: Diagnosis not present

## 2021-07-13 DIAGNOSIS — M25551 Pain in right hip: Secondary | ICD-10-CM | POA: Diagnosis not present

## 2021-07-13 DIAGNOSIS — E782 Mixed hyperlipidemia: Secondary | ICD-10-CM | POA: Diagnosis not present

## 2021-07-13 DIAGNOSIS — I10 Essential (primary) hypertension: Secondary | ICD-10-CM | POA: Diagnosis not present

## 2021-07-13 DIAGNOSIS — E1165 Type 2 diabetes mellitus with hyperglycemia: Secondary | ICD-10-CM | POA: Diagnosis not present

## 2021-07-13 DIAGNOSIS — R252 Cramp and spasm: Secondary | ICD-10-CM | POA: Diagnosis not present

## 2021-07-13 DIAGNOSIS — I739 Peripheral vascular disease, unspecified: Secondary | ICD-10-CM | POA: Diagnosis not present

## 2021-07-13 DIAGNOSIS — R809 Proteinuria, unspecified: Secondary | ICD-10-CM | POA: Diagnosis not present

## 2021-07-13 DIAGNOSIS — Z191 Hormone sensitive malignancy status: Secondary | ICD-10-CM | POA: Diagnosis not present

## 2021-07-15 DIAGNOSIS — M7061 Trochanteric bursitis, right hip: Secondary | ICD-10-CM | POA: Diagnosis not present

## 2021-07-28 DIAGNOSIS — Z794 Long term (current) use of insulin: Secondary | ICD-10-CM | POA: Diagnosis not present

## 2021-07-28 DIAGNOSIS — E119 Type 2 diabetes mellitus without complications: Secondary | ICD-10-CM | POA: Diagnosis not present

## 2021-08-27 DIAGNOSIS — Z794 Long term (current) use of insulin: Secondary | ICD-10-CM | POA: Diagnosis not present

## 2021-08-27 DIAGNOSIS — E119 Type 2 diabetes mellitus without complications: Secondary | ICD-10-CM | POA: Diagnosis not present

## 2021-09-01 ENCOUNTER — Encounter: Payer: Self-pay | Admitting: Podiatry

## 2021-09-01 ENCOUNTER — Ambulatory Visit: Payer: Medicare Other | Admitting: Podiatry

## 2021-09-01 DIAGNOSIS — Z86010 Personal history of colonic polyps: Secondary | ICD-10-CM | POA: Insufficient documentation

## 2021-09-01 DIAGNOSIS — E1142 Type 2 diabetes mellitus with diabetic polyneuropathy: Secondary | ICD-10-CM

## 2021-09-01 DIAGNOSIS — D689 Coagulation defect, unspecified: Secondary | ICD-10-CM | POA: Diagnosis not present

## 2021-09-01 DIAGNOSIS — B351 Tinea unguium: Secondary | ICD-10-CM

## 2021-09-01 DIAGNOSIS — L97521 Non-pressure chronic ulcer of other part of left foot limited to breakdown of skin: Secondary | ICD-10-CM

## 2021-09-01 DIAGNOSIS — M79676 Pain in unspecified toe(s): Secondary | ICD-10-CM

## 2021-09-01 DIAGNOSIS — M7751 Other enthesopathy of right foot: Secondary | ICD-10-CM

## 2021-09-01 DIAGNOSIS — Z860101 Personal history of adenomatous and serrated colon polyps: Secondary | ICD-10-CM | POA: Insufficient documentation

## 2021-09-01 DIAGNOSIS — Z862 Personal history of diseases of the blood and blood-forming organs and certain disorders involving the immune mechanism: Secondary | ICD-10-CM | POA: Insufficient documentation

## 2021-09-01 DIAGNOSIS — K2990 Gastroduodenitis, unspecified, without bleeding: Secondary | ICD-10-CM | POA: Insufficient documentation

## 2021-09-01 DIAGNOSIS — D500 Iron deficiency anemia secondary to blood loss (chronic): Secondary | ICD-10-CM | POA: Insufficient documentation

## 2021-09-01 NOTE — Progress Notes (Signed)
Subjective:  Patient ID: Randy Jacobson, male    DOB: 11-06-1945,  MRN: [id number] HPI Chief Complaint  Patient presents with   Toe Pain    Hallux left - wound tip of toe, stumped it 3-4 weeks ago, looking much better now   Nail Problem    Toenails bilateral - thick, dark and long   New Patient (Initial Visit)    76 y.o. male presents with the above complaint.   ROS: Denies fever chills nausea vomiting muscle aches pains calf pain back pain chest pain shortness of breath.  Past Medical History:  Diagnosis Date   Diabetes mellitus without complication (HCC)    Hypertension    Past Surgical History:  Procedure Laterality Date   "stents in both my legs"     GIVENS CAPSULE STUDY N/A 04/10/2017   Procedure: GIVENS CAPSULE STUDY;  Surgeon: Charlott Rakes, MD;  Location: [facility] ENDOSCOPY;  Service: Endoscopy;  Laterality: N/A;   IR GENERIC HISTORICAL  08/26/2015   IR RADIOLOGIST EVAL & MGMT 08/26/2015 Malachy Moan, MD [facility] INTERV RAD   IR GENERIC HISTORICAL  04/21/2016   IR RADIOLOGIST EVAL & MGMT 04/21/2016 Malachy Moan, MD [facility] INTERV RAD    Current Outpatient Medications:    albuterol (VENTOLIN HFA) 108 (90 Base) MCG/ACT inhaler, Inhale into the lungs., Disp: , Rfl:    clopidogrel (PLAVIX) 75 MG tablet, Take 75 mg by mouth daily., Disp: , Rfl:    glimepiride (AMARYL) 4 MG tablet, Take 4 mg by mouth 2 (two) times daily. , Disp: , Rfl:    hydrochlorothiazide (HYDRODIURIL) 25 MG tablet, Take 25 mg by mouth daily., Disp: , Rfl:    JANUVIA 100 MG tablet, Take 100 mg by mouth daily., Disp: , Rfl:    metFORMIN (GLUCOPHAGE) 1000 MG tablet, Take 1,000 mg by mouth 2 (two) times daily with a meal., Disp: , Rfl:    Multiple Vitamin (MULTIVITAMIN WITH MINERALS) TABS tablet, Take 1 tablet by mouth daily., Disp: , Rfl:    simvastatin (ZOCOR) 40 MG tablet, Take 40 mg by mouth every evening. , Disp: , Rfl:   No Known Allergies Review of Systems Objective:  There were no vitals filed  for this visit.  General: Well developed, nourished, in no acute distress, alert and oriented x3   Dermatological: Skin is warm, dry and supple bilateral. 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  Remaining integument appears unremarkable at this time. There are no open sores, no preulcerative lesions, no rash or signs of infection present.  Superficial ulceration to the tip of the hallux left foot.  It appears to be healing very nicely his wife states that it was a major wound but has started to get much better recently.  He states that he cannot cut his toenails because they are painful.  They are long thick yellow dystrophic and clinically mycotic.  Vascular: Dorsalis Pedis artery and Posterior Tibial artery pedal pulses are 2/4 bilateral with immedate capillary fill time. Pedal hair growth present. No varicosities and no lower extremity edema present bilateral.   Neruologic: Grossly intact via light touch bilateral. Vibratory intact via tuning fork bilateral. Protective threshold with Semmes Wienstein monofilament diminished to all pedal sites bilateral. Patellar and Achilles deep tendon reflexes 2+ bilateral. No Babinski or clonus noted bilateral.   Musculoskeletal: No gross boney pedal deformities bilateral. No pain, crepitus, or limitation noted with foot and ankle range of motion bilateral. Muscular strength 5/5 in all groups tested bilateral.  Gait:  Unassisted, Nonantalgic.    Radiographs:  None taken  Assessment & Plan:   Assessment: Ulceration distal aspect of the hallux left is healing nicely painful mycotic nails.   Plan: Encouraged him to continue daily treatment to the wound.  I also debrided his nails for him today 1 through 5 bilateral.     Jamin Humphries T. [place], [place]

## 2021-09-26 DIAGNOSIS — E119 Type 2 diabetes mellitus without complications: Secondary | ICD-10-CM | POA: Diagnosis not present

## 2021-09-26 DIAGNOSIS — Z794 Long term (current) use of insulin: Secondary | ICD-10-CM | POA: Diagnosis not present

## 2021-10-16 DIAGNOSIS — K121 Other forms of stomatitis: Secondary | ICD-10-CM | POA: Diagnosis not present

## 2021-10-16 DIAGNOSIS — I739 Peripheral vascular disease, unspecified: Secondary | ICD-10-CM | POA: Diagnosis not present

## 2021-10-16 DIAGNOSIS — E782 Mixed hyperlipidemia: Secondary | ICD-10-CM | POA: Diagnosis not present

## 2021-10-16 DIAGNOSIS — R053 Chronic cough: Secondary | ICD-10-CM | POA: Diagnosis not present

## 2021-10-16 DIAGNOSIS — E1165 Type 2 diabetes mellitus with hyperglycemia: Secondary | ICD-10-CM | POA: Diagnosis not present

## 2021-10-16 DIAGNOSIS — R809 Proteinuria, unspecified: Secondary | ICD-10-CM | POA: Diagnosis not present

## 2021-10-16 DIAGNOSIS — M545 Low back pain, unspecified: Secondary | ICD-10-CM | POA: Diagnosis not present

## 2021-10-16 DIAGNOSIS — E611 Iron deficiency: Secondary | ICD-10-CM | POA: Diagnosis not present

## 2021-10-20 DIAGNOSIS — Z794 Long term (current) use of insulin: Secondary | ICD-10-CM | POA: Diagnosis not present

## 2021-10-20 DIAGNOSIS — E119 Type 2 diabetes mellitus without complications: Secondary | ICD-10-CM | POA: Diagnosis not present

## 2021-10-28 DIAGNOSIS — M25551 Pain in right hip: Secondary | ICD-10-CM | POA: Diagnosis not present

## 2021-10-28 DIAGNOSIS — E611 Iron deficiency: Secondary | ICD-10-CM | POA: Diagnosis not present

## 2021-10-28 DIAGNOSIS — R809 Proteinuria, unspecified: Secondary | ICD-10-CM | POA: Diagnosis not present

## 2021-10-28 DIAGNOSIS — M545 Low back pain, unspecified: Secondary | ICD-10-CM | POA: Diagnosis not present

## 2021-10-28 DIAGNOSIS — E782 Mixed hyperlipidemia: Secondary | ICD-10-CM | POA: Diagnosis not present

## 2021-10-28 DIAGNOSIS — R053 Chronic cough: Secondary | ICD-10-CM | POA: Diagnosis not present

## 2021-10-28 DIAGNOSIS — I739 Peripheral vascular disease, unspecified: Secondary | ICD-10-CM | POA: Diagnosis not present

## 2021-10-28 DIAGNOSIS — R252 Cramp and spasm: Secondary | ICD-10-CM | POA: Diagnosis not present

## 2021-10-28 DIAGNOSIS — E1165 Type 2 diabetes mellitus with hyperglycemia: Secondary | ICD-10-CM | POA: Diagnosis not present

## 2021-10-28 DIAGNOSIS — Z191 Hormone sensitive malignancy status: Secondary | ICD-10-CM | POA: Diagnosis not present

## 2021-10-28 DIAGNOSIS — K121 Other forms of stomatitis: Secondary | ICD-10-CM | POA: Diagnosis not present

## 2021-10-28 DIAGNOSIS — I10 Essential (primary) hypertension: Secondary | ICD-10-CM | POA: Diagnosis not present

## 2021-11-16 ENCOUNTER — Ambulatory Visit (INDEPENDENT_AMBULATORY_CARE_PROVIDER_SITE_OTHER): Payer: Medicare Other

## 2021-11-16 ENCOUNTER — Ambulatory Visit
Admission: EM | Admit: 2021-11-16 | Discharge: 2021-11-16 | Disposition: A | Discharge status: Home / Self Care | Payer: Medicare Other | Attending: Family Medicine | Admitting: Family Medicine

## 2021-11-16 DIAGNOSIS — R059 Cough, unspecified: Secondary | ICD-10-CM | POA: Diagnosis not present

## 2021-11-16 DIAGNOSIS — R053 Chronic cough: Secondary | ICD-10-CM | POA: Diagnosis not present

## 2021-11-16 DIAGNOSIS — Z87891 Personal history of nicotine dependence: Secondary | ICD-10-CM

## 2021-11-16 MED ORDER — PROMETHAZINE-DM 6.25-15 MG/5ML PO SYRP: 5.0000 mL | ORAL_SOLUTION | Freq: Four times a day (QID) | ORAL | 0 refills | Status: DC | PRN

## 2021-11-16 MED ORDER — FLUTICASONE PROPIONATE 50 MCG/ACT NA SUSP: 1.0000 | Freq: Two times a day (BID) | NASAL | 2 refills | Status: DC

## 2021-11-16 MED ORDER — BUDESONIDE-FORMOTEROL FUMARATE 160-4.5 MCG/ACT IN AERO: 2.0000 | INHALATION_SPRAY | Freq: Two times a day (BID) | RESPIRATORY_TRACT | 1 refills | Status: DC

## 2021-11-16 NOTE — ED Triage Notes (Signed)
Pt presents with c/o cough for past year and states he feels that his PCP hasn't helped him. Imagining done 6 months ago

## 2021-11-17 NOTE — ED Provider Notes (Signed)
[facility]    CSN: [id number] Arrival date & time: 11/16/21  1422      History   Chief Complaint Chief Complaint  Patient presents with   Cough    HPI Randy Jacobson is a 76 y.o. male.   Patient presenting today with 1 year history of chronic daily cough worse at night when he is laying down.  States he coughs up phlegm constantly throughout the day but much worse when he lays down.  Does have wheezes from time to time.  He denies any fevers, chills, congestion, sore throat, chest pain, abdominal pain, nausea vomiting or diarrhea.  Has seen his primary care provider for this but does not feel like they are helping him.  Chest x-ray was done 6 months ago which was negative for acute pulmonary abnormalities.  He was given albuterol inhaler which she has tried in the past but was not helpful.  Taking cough syrups over-the-counter with minimal relief at this time.  States he has never been diagnosed with a pulmonary disease, he is a former smoker but quit 25 years ago.    Past Medical History:  Diagnosis Date   Diabetes mellitus without complication (HCC)    Hypertension     Patient Active Problem List   Diagnosis Date Noted   Gastroduodenitis 09/01/2021   History of adenomatous polyp of colon 09/01/2021   History of anemia 09/01/2021   Iron deficiency anemia due to chronic blood loss 09/01/2021   Cough 01/13/2021   History of peptic ulcer 09/04/2020   Insomnia 09/04/2020   Iron deficiency 09/04/2020   Proteinuria 09/04/2020   White blood cell abnormality 09/04/2020   Hyperglycemia due to type 2 diabetes mellitus (HCC) 09/01/2020   GI bleeding 04/08/2017   Diabetes mellitus, type 2 (HCC) 12/16/2015   Sepsis (HCC) 12/16/2015   Hyperlipidemia 12/16/2015   Hypertension 12/16/2015   Diarrhea    Febrile illness    Enteric campylobacteriosis    Claudication of right lower extremity (HCC)    PAD (peripheral artery disease) (HCC)     Past Surgical History:   Procedure Laterality Date   "stents in both my legs"     GIVENS CAPSULE STUDY N/A 04/10/2017   Procedure: GIVENS CAPSULE STUDY;  Surgeon: Charlott Rakes, MD;  Location: [facility] ENDOSCOPY;  Service: Endoscopy;  Laterality: N/A;   IR GENERIC HISTORICAL  08/26/2015   IR RADIOLOGIST EVAL & MGMT 08/26/2015 Malachy Moan, MD [facility] INTERV RAD   IR GENERIC HISTORICAL  04/21/2016   IR RADIOLOGIST EVAL & MGMT 04/21/2016 Malachy Moan, MD [facility] INTERV RAD       Home Medications    Prior to Admission medications   Medication Sig Start Date End Date Taking? Authorizing Provider  budesonide-formoterol (SYMBICORT) 160-4.5 MCG/ACT inhaler Inhale 2 puffs into the lungs 2 (two) times daily. Rinse mouth with water after each use 11/16/21  Yes Particia Nearing, PA-C  fluticasone [facility]) 50 MCG/ACT nasal spray Place 1 spray into both nostrils 2 (two) times daily. 11/16/21  Yes Particia Nearing, PA-C  promethazine-dextromethorphan (PROMETHAZINE-DM) 6.25-15 MG/5ML syrup Take 5 mLs by mouth 4 (four) times daily as needed. 11/16/21  Yes Particia Nearing, PA-C  albuterol (VENTOLIN HFA) 108 (90 Base) MCG/ACT inhaler Inhale into the lungs. 03/24/21   [provider]  clopidogrel (PLAVIX) 75 MG tablet Take 75 mg by mouth daily. 10/30/15   [provider]  glimepiride (AMARYL) 4 MG tablet Take 4 mg by mouth 2 (two) times daily.  09/08/15   [provider]  hydrochlorothiazide (HYDRODIURIL) 25 MG tablet Take 25 mg by mouth daily. 02/03/17   [provider]  JANUVIA 100 MG tablet Take 100 mg by mouth daily. 03/18/17   [provider]  metFORMIN (GLUCOPHAGE) 1000 MG tablet Take 1,000 mg by mouth 2 (two) times daily with a meal.    [provider]  Multiple Vitamin (MULTIVITAMIN WITH MINERALS) TABS tablet Take 1 tablet by mouth daily.    [provider]  simvastatin (ZOCOR) 40 MG tablet Take 40 mg by mouth every evening.     [provider]    Family History Family History  Problem Relation Age of Onset   Diabetes Brother     Social History Social History   Tobacco Use   Smoking status: Former    Packs/day: 1.00    Types: Cigarettes    Start date: 11/20/1957    Quit date: 11/20/1993    Years since quitting: 28.0   Smokeless tobacco: Never  Vaping Use   Vaping Use: Never used  Substance Use Topics   Alcohol use: Yes    Alcohol/week: 0.0 standard drinks of alcohol    Comment: occ   Drug use: Never     Allergies   Patient has no known allergies.   Review of Systems Review of Systems Per HPI  Physical Exam Triage Vital Signs ED Triage Vitals  Enc Vitals Group     BP 11/16/21 1443 123/79     Pulse Rate 11/16/21 1443 96     Resp 11/16/21 1443 18     Temp 11/16/21 1443 97.9 F (36.6 C)     Temp src --      SpO2 11/16/21 1443 93 %     Weight --      Height --      Head Circumference --      Peak Flow --      Pain Score 11/16/21 1442 4     Pain Loc --      Pain Edu? --      Excl. in GC? --    No data found.  Updated Vital Signs BP 123/79   Pulse 96   Temp 97.9 F (36.6 C)   Resp 18   SpO2 93%   Visual Acuity Right Eye Distance:   Left Eye Distance:   Bilateral Distance:    Right Eye Near:   Left Eye Near:    Bilateral Near:     Physical Exam Vitals and nursing note reviewed.  Constitutional:      Appearance: Normal appearance.  HENT:     Head: Atraumatic.     Nose: Nose normal.     Mouth/Throat:     Mouth: Mucous membranes are moist.     Pharynx: Posterior oropharyngeal erythema present. No oropharyngeal exudate.  Eyes:     Extraocular Movements: Extraocular movements intact.     Conjunctiva/sclera: Conjunctivae normal.  Cardiovascular:     Rate and Rhythm: Normal rate and regular rhythm.  Pulmonary:     Effort: Pulmonary effort is normal.     Breath sounds: Wheezing present. No rales.     Comments: Mildly decreased breath sounds throughout.  Trace wheezes  bilaterally Musculoskeletal:        General: Normal range of motion.     Cervical back: Normal range of motion and neck supple.  Skin:    General: Skin is warm and dry.  Neurological:     General: No  focal deficit present.     Mental Status: He is oriented to person, place, and time.  Psychiatric:        Mood and Affect: Mood normal.        Thought Content: Thought content normal.        Judgment: Judgment normal.      UC Treatments / Results  Labs (all labs ordered are listed, but only abnormal results are displayed) Labs Reviewed - No data to display  EKG   Radiology DG Chest 2 View  Result Date: 11/16/2021 CLINICAL DATA:  Cough. EXAM: CHEST - 2 VIEW COMPARISON:  March 24, 2021. FINDINGS: The heart size and mediastinal contours are within normal limits. Both lungs are clear. The visualized skeletal structures are unremarkable. IMPRESSION: No active cardiopulmonary disease. Aortic Atherosclerosis (ICD10-I70.0). Electronically Signed   By: Marijo Conception M.D.   On: 11/16/2021 15:07    Procedures Procedures (including critical care time)  Medications Ordered in UC Medications - No data to display  Initial Impression / Assessment and Plan / UC Course  I have reviewed the triage vital signs and the nursing notes.  Pertinent labs & imaging results that were available during my care of the patient were reviewed by me and considered in my medical decision making (see chart for details).     Vital signs benign and reassuring, chest x-ray negative for acute cardiopulmonary abnormality.  Given his smoking history, duration of symptoms and quality of symptoms suspect some mild COPD.  Discussed need for further testing, and to return to PCP or pulmonology resources given.  In the meantime, will trial Symbicort inhaler, Flonase, Phenergan DM.  Return for worsening symptoms.  Final Clinical Impressions(s) / UC Diagnoses   Final diagnoses:  Chronic cough  Former smoker    Discharge Instructions   None    ED Prescriptions     Medication Sig Dispense Auth. Provider   fluticasone (FLONASE) 50 MCG/ACT nasal spray Place 1 spray into both nostrils 2 (two) times daily. [place], PA-C   budesonide-formoterol [facility]) 160-4.5 MCG/ACT inhaler Inhale 2 puffs into the lungs 2 (two) times daily. Rinse mouth with water after each use 1 each Volney American, PA-C   promethazine-dextromethorphan (PROMETHAZINE-DM) 6.25-15 MG/5ML syrup Take 5 mLs by mouth 4 (four) times daily as needed. 100 mL Volney American, [place]      PDMP not reviewed this encounter.   Volney American, [place] 11/17/21 1810

## 2021-11-19 DIAGNOSIS — Z794 Long term (current) use of insulin: Secondary | ICD-10-CM | POA: Diagnosis not present

## 2021-11-19 DIAGNOSIS — E119 Type 2 diabetes mellitus without complications: Secondary | ICD-10-CM | POA: Diagnosis not present

## 2021-12-02 DIAGNOSIS — R058 Other specified cough: Secondary | ICD-10-CM | POA: Diagnosis not present

## 2021-12-02 DIAGNOSIS — T464X5A Adverse effect of angiotensin-converting-enzyme inhibitors, initial encounter: Secondary | ICD-10-CM | POA: Diagnosis not present

## 2021-12-02 DIAGNOSIS — R053 Chronic cough: Secondary | ICD-10-CM | POA: Diagnosis not present

## 2021-12-02 DIAGNOSIS — Z87891 Personal history of nicotine dependence: Secondary | ICD-10-CM | POA: Diagnosis not present

## 2021-12-16 DIAGNOSIS — J069 Acute upper respiratory infection, unspecified: Secondary | ICD-10-CM | POA: Diagnosis not present

## 2021-12-17 ENCOUNTER — Ambulatory Visit: Payer: Medicare Other | Admitting: Allergy & Immunology

## 2021-12-19 DIAGNOSIS — Z794 Long term (current) use of insulin: Secondary | ICD-10-CM | POA: Diagnosis not present

## 2021-12-19 DIAGNOSIS — E119 Type 2 diabetes mellitus without complications: Secondary | ICD-10-CM | POA: Diagnosis not present

## 2022-01-25 DIAGNOSIS — E1143 Type 2 diabetes mellitus with diabetic autonomic (poly)neuropathy: Secondary | ICD-10-CM | POA: Diagnosis not present

## 2022-01-25 DIAGNOSIS — I10 Essential (primary) hypertension: Secondary | ICD-10-CM | POA: Diagnosis not present

## 2022-01-25 DIAGNOSIS — D500 Iron deficiency anemia secondary to blood loss (chronic): Secondary | ICD-10-CM | POA: Diagnosis not present

## 2022-01-25 DIAGNOSIS — K2970 Gastritis, unspecified, without bleeding: Secondary | ICD-10-CM | POA: Diagnosis not present

## 2022-01-25 DIAGNOSIS — K219 Gastro-esophageal reflux disease without esophagitis: Secondary | ICD-10-CM | POA: Diagnosis not present

## 2022-01-25 DIAGNOSIS — I739 Peripheral vascular disease, unspecified: Secondary | ICD-10-CM | POA: Diagnosis not present

## 2022-01-25 DIAGNOSIS — E785 Hyperlipidemia, unspecified: Secondary | ICD-10-CM | POA: Diagnosis not present

## 2022-01-25 DIAGNOSIS — G629 Polyneuropathy, unspecified: Secondary | ICD-10-CM | POA: Diagnosis not present

## 2022-01-29 DIAGNOSIS — E785 Hyperlipidemia, unspecified: Secondary | ICD-10-CM | POA: Diagnosis not present

## 2022-01-29 DIAGNOSIS — E1151 Type 2 diabetes mellitus with diabetic peripheral angiopathy without gangrene: Secondary | ICD-10-CM | POA: Diagnosis not present

## 2022-01-29 DIAGNOSIS — I081 Rheumatic disorders of both mitral and tricuspid valves: Secondary | ICD-10-CM | POA: Diagnosis not present

## 2022-01-29 DIAGNOSIS — N281 Cyst of kidney, acquired: Secondary | ICD-10-CM | POA: Diagnosis not present

## 2022-01-29 DIAGNOSIS — I160 Hypertensive urgency: Secondary | ICD-10-CM | POA: Diagnosis not present

## 2022-01-29 DIAGNOSIS — D849 Immunodeficiency, unspecified: Secondary | ICD-10-CM | POA: Diagnosis not present

## 2022-01-29 DIAGNOSIS — T464X5A Adverse effect of angiotensin-converting-enzyme inhibitors, initial encounter: Secondary | ICD-10-CM | POA: Diagnosis not present

## 2022-01-29 DIAGNOSIS — K8020 Calculus of gallbladder without cholecystitis without obstruction: Secondary | ICD-10-CM | POA: Diagnosis not present

## 2022-01-29 DIAGNOSIS — J449 Chronic obstructive pulmonary disease, unspecified: Secondary | ICD-10-CM | POA: Diagnosis not present

## 2022-01-29 DIAGNOSIS — R110 Nausea: Secondary | ICD-10-CM | POA: Diagnosis not present

## 2022-01-29 DIAGNOSIS — R058 Other specified cough: Secondary | ICD-10-CM | POA: Diagnosis not present

## 2022-01-29 DIAGNOSIS — R109 Unspecified abdominal pain: Secondary | ICD-10-CM | POA: Diagnosis not present

## 2022-01-29 DIAGNOSIS — K219 Gastro-esophageal reflux disease without esophagitis: Secondary | ICD-10-CM | POA: Diagnosis not present

## 2022-01-29 DIAGNOSIS — I700 Atherosclerosis of aorta: Secondary | ICD-10-CM | POA: Diagnosis not present

## 2022-01-29 DIAGNOSIS — K5730 Diverticulosis of large intestine without perforation or abscess without bleeding: Secondary | ICD-10-CM | POA: Diagnosis not present

## 2022-01-29 DIAGNOSIS — Z7984 Long term (current) use of oral hypoglycemic drugs: Secondary | ICD-10-CM | POA: Diagnosis not present

## 2022-01-29 DIAGNOSIS — R053 Chronic cough: Secondary | ICD-10-CM | POA: Diagnosis not present

## 2022-01-29 DIAGNOSIS — Z79899 Other long term (current) drug therapy: Secondary | ICD-10-CM | POA: Diagnosis not present

## 2022-01-29 DIAGNOSIS — D649 Anemia, unspecified: Secondary | ICD-10-CM | POA: Diagnosis not present

## 2022-01-29 DIAGNOSIS — I4510 Unspecified right bundle-branch block: Secondary | ICD-10-CM | POA: Diagnosis not present

## 2022-01-29 DIAGNOSIS — E875 Hyperkalemia: Secondary | ICD-10-CM | POA: Diagnosis not present

## 2022-01-29 DIAGNOSIS — R1033 Periumbilical pain: Secondary | ICD-10-CM | POA: Diagnosis not present

## 2022-01-29 DIAGNOSIS — E119 Type 2 diabetes mellitus without complications: Secondary | ICD-10-CM | POA: Diagnosis not present

## 2022-01-29 DIAGNOSIS — R1010 Upper abdominal pain, unspecified: Secondary | ICD-10-CM | POA: Diagnosis not present

## 2022-01-29 DIAGNOSIS — K828 Other specified diseases of gallbladder: Secondary | ICD-10-CM | POA: Diagnosis not present

## 2022-01-29 DIAGNOSIS — R1013 Epigastric pain: Secondary | ICD-10-CM | POA: Diagnosis not present

## 2022-01-29 DIAGNOSIS — I10 Essential (primary) hypertension: Secondary | ICD-10-CM | POA: Diagnosis not present

## 2022-01-29 DIAGNOSIS — I739 Peripheral vascular disease, unspecified: Secondary | ICD-10-CM | POA: Diagnosis not present

## 2022-01-29 DIAGNOSIS — K838 Other specified diseases of biliary tract: Secondary | ICD-10-CM | POA: Diagnosis not present

## 2022-01-29 DIAGNOSIS — K760 Fatty (change of) liver, not elsewhere classified: Secondary | ICD-10-CM | POA: Diagnosis not present

## 2022-01-29 DIAGNOSIS — Z7902 Long term (current) use of antithrombotics/antiplatelets: Secondary | ICD-10-CM | POA: Diagnosis not present

## 2022-01-29 DIAGNOSIS — Z87891 Personal history of nicotine dependence: Secondary | ICD-10-CM | POA: Diagnosis not present

## 2022-01-29 DIAGNOSIS — Z794 Long term (current) use of insulin: Secondary | ICD-10-CM | POA: Diagnosis not present

## 2022-01-29 DIAGNOSIS — R030 Elevated blood-pressure reading, without diagnosis of hypertension: Secondary | ICD-10-CM | POA: Diagnosis not present

## 2022-01-29 DIAGNOSIS — I714 Abdominal aortic aneurysm, without rupture, unspecified: Secondary | ICD-10-CM | POA: Diagnosis not present

## 2022-01-29 DIAGNOSIS — Z8711 Personal history of peptic ulcer disease: Secondary | ICD-10-CM | POA: Diagnosis not present

## 2022-01-29 DIAGNOSIS — Z20822 Contact with and (suspected) exposure to covid-19: Secondary | ICD-10-CM | POA: Diagnosis not present

## 2022-01-30 DIAGNOSIS — E119 Type 2 diabetes mellitus without complications: Secondary | ICD-10-CM | POA: Diagnosis not present

## 2022-01-30 DIAGNOSIS — Z794 Long term (current) use of insulin: Secondary | ICD-10-CM | POA: Diagnosis not present

## 2022-01-30 DIAGNOSIS — I10 Essential (primary) hypertension: Secondary | ICD-10-CM | POA: Diagnosis not present

## 2022-01-30 DIAGNOSIS — I739 Peripheral vascular disease, unspecified: Secondary | ICD-10-CM | POA: Diagnosis not present

## 2022-01-30 DIAGNOSIS — Z7689 Persons encountering health services in other specified circumstances: Secondary | ICD-10-CM | POA: Diagnosis not present

## 2022-01-30 DIAGNOSIS — K219 Gastro-esophageal reflux disease without esophagitis: Secondary | ICD-10-CM | POA: Diagnosis not present

## 2022-01-30 DIAGNOSIS — E785 Hyperlipidemia, unspecified: Secondary | ICD-10-CM | POA: Diagnosis not present

## 2022-01-30 DIAGNOSIS — K828 Other specified diseases of gallbladder: Secondary | ICD-10-CM | POA: Diagnosis not present

## 2022-01-30 DIAGNOSIS — D649 Anemia, unspecified: Secondary | ICD-10-CM | POA: Diagnosis not present

## 2022-01-30 DIAGNOSIS — Z7984 Long term (current) use of oral hypoglycemic drugs: Secondary | ICD-10-CM | POA: Diagnosis not present

## 2022-01-30 DIAGNOSIS — I160 Hypertensive urgency: Secondary | ICD-10-CM | POA: Diagnosis not present

## 2022-02-01 DIAGNOSIS — M25551 Pain in right hip: Secondary | ICD-10-CM | POA: Diagnosis not present

## 2022-02-01 DIAGNOSIS — Z191 Hormone sensitive malignancy status: Secondary | ICD-10-CM | POA: Diagnosis not present

## 2022-02-01 DIAGNOSIS — I739 Peripheral vascular disease, unspecified: Secondary | ICD-10-CM | POA: Diagnosis not present

## 2022-02-01 DIAGNOSIS — R053 Chronic cough: Secondary | ICD-10-CM | POA: Diagnosis not present

## 2022-02-01 DIAGNOSIS — I10 Essential (primary) hypertension: Secondary | ICD-10-CM | POA: Diagnosis not present

## 2022-02-01 DIAGNOSIS — M545 Low back pain, unspecified: Secondary | ICD-10-CM | POA: Diagnosis not present

## 2022-02-01 DIAGNOSIS — E611 Iron deficiency: Secondary | ICD-10-CM | POA: Diagnosis not present

## 2022-02-01 DIAGNOSIS — E782 Mixed hyperlipidemia: Secondary | ICD-10-CM | POA: Diagnosis not present

## 2022-02-01 DIAGNOSIS — E1165 Type 2 diabetes mellitus with hyperglycemia: Secondary | ICD-10-CM | POA: Diagnosis not present

## 2022-02-01 DIAGNOSIS — R809 Proteinuria, unspecified: Secondary | ICD-10-CM | POA: Diagnosis not present

## 2022-02-01 DIAGNOSIS — R252 Cramp and spasm: Secondary | ICD-10-CM | POA: Diagnosis not present

## 2022-02-08 DIAGNOSIS — D72829 Elevated white blood cell count, unspecified: Secondary | ICD-10-CM | POA: Diagnosis not present

## 2022-02-08 DIAGNOSIS — M545 Low back pain, unspecified: Secondary | ICD-10-CM | POA: Diagnosis not present

## 2022-02-08 DIAGNOSIS — Z0000 Encounter for general adult medical examination without abnormal findings: Secondary | ICD-10-CM | POA: Diagnosis not present

## 2022-02-08 DIAGNOSIS — M25551 Pain in right hip: Secondary | ICD-10-CM | POA: Diagnosis not present

## 2022-02-08 DIAGNOSIS — R252 Cramp and spasm: Secondary | ICD-10-CM | POA: Diagnosis not present

## 2022-02-08 DIAGNOSIS — E118 Type 2 diabetes mellitus with unspecified complications: Secondary | ICD-10-CM | POA: Diagnosis not present

## 2022-02-08 DIAGNOSIS — E782 Mixed hyperlipidemia: Secondary | ICD-10-CM | POA: Diagnosis not present

## 2022-02-08 DIAGNOSIS — E611 Iron deficiency: Secondary | ICD-10-CM | POA: Diagnosis not present

## 2022-02-08 DIAGNOSIS — I739 Peripheral vascular disease, unspecified: Secondary | ICD-10-CM | POA: Diagnosis not present

## 2022-02-08 DIAGNOSIS — R809 Proteinuria, unspecified: Secondary | ICD-10-CM | POA: Diagnosis not present

## 2022-02-08 DIAGNOSIS — I10 Essential (primary) hypertension: Secondary | ICD-10-CM | POA: Diagnosis not present

## 2022-02-08 DIAGNOSIS — Z191 Hormone sensitive malignancy status: Secondary | ICD-10-CM | POA: Diagnosis not present

## 2022-03-08 DIAGNOSIS — K219 Gastro-esophageal reflux disease without esophagitis: Secondary | ICD-10-CM | POA: Diagnosis not present

## 2022-03-08 DIAGNOSIS — E1165 Type 2 diabetes mellitus with hyperglycemia: Secondary | ICD-10-CM | POA: Diagnosis not present

## 2022-03-08 DIAGNOSIS — Z7902 Long term (current) use of antithrombotics/antiplatelets: Secondary | ICD-10-CM | POA: Diagnosis not present

## 2022-03-08 DIAGNOSIS — M545 Low back pain, unspecified: Secondary | ICD-10-CM | POA: Diagnosis not present

## 2022-03-08 DIAGNOSIS — I10 Essential (primary) hypertension: Secondary | ICD-10-CM | POA: Diagnosis not present

## 2022-03-12 DIAGNOSIS — E119 Type 2 diabetes mellitus without complications: Secondary | ICD-10-CM | POA: Diagnosis not present

## 2022-03-12 DIAGNOSIS — Z794 Long term (current) use of insulin: Secondary | ICD-10-CM | POA: Diagnosis not present

## 2022-04-11 DIAGNOSIS — Z794 Long term (current) use of insulin: Secondary | ICD-10-CM | POA: Diagnosis not present

## 2022-04-11 DIAGNOSIS — E119 Type 2 diabetes mellitus without complications: Secondary | ICD-10-CM | POA: Diagnosis not present

## 2022-04-30 DIAGNOSIS — M48062 Spinal stenosis, lumbar region with neurogenic claudication: Secondary | ICD-10-CM | POA: Diagnosis not present

## 2022-05-04 DIAGNOSIS — M545 Low back pain, unspecified: Secondary | ICD-10-CM | POA: Diagnosis not present

## 2022-05-10 DIAGNOSIS — M5416 Radiculopathy, lumbar region: Secondary | ICD-10-CM | POA: Diagnosis not present

## 2022-05-11 DIAGNOSIS — Z794 Long term (current) use of insulin: Secondary | ICD-10-CM | POA: Diagnosis not present

## 2022-05-11 DIAGNOSIS — E119 Type 2 diabetes mellitus without complications: Secondary | ICD-10-CM | POA: Diagnosis not present

## 2022-05-14 DIAGNOSIS — E118 Type 2 diabetes mellitus with unspecified complications: Secondary | ICD-10-CM | POA: Diagnosis not present

## 2022-05-14 DIAGNOSIS — E611 Iron deficiency: Secondary | ICD-10-CM | POA: Diagnosis not present

## 2022-05-14 DIAGNOSIS — E782 Mixed hyperlipidemia: Secondary | ICD-10-CM | POA: Diagnosis not present

## 2022-05-20 DIAGNOSIS — R809 Proteinuria, unspecified: Secondary | ICD-10-CM | POA: Diagnosis not present

## 2022-05-20 DIAGNOSIS — E782 Mixed hyperlipidemia: Secondary | ICD-10-CM | POA: Diagnosis not present

## 2022-05-20 DIAGNOSIS — Z191 Hormone sensitive malignancy status: Secondary | ICD-10-CM | POA: Diagnosis not present

## 2022-05-20 DIAGNOSIS — I10 Essential (primary) hypertension: Secondary | ICD-10-CM | POA: Diagnosis not present

## 2022-05-20 DIAGNOSIS — G8929 Other chronic pain: Secondary | ICD-10-CM | POA: Diagnosis not present

## 2022-05-20 DIAGNOSIS — M545 Low back pain, unspecified: Secondary | ICD-10-CM | POA: Diagnosis not present

## 2022-05-20 DIAGNOSIS — E611 Iron deficiency: Secondary | ICD-10-CM | POA: Diagnosis not present

## 2022-05-20 DIAGNOSIS — I739 Peripheral vascular disease, unspecified: Secondary | ICD-10-CM | POA: Diagnosis not present

## 2022-05-20 DIAGNOSIS — K219 Gastro-esophageal reflux disease without esophagitis: Secondary | ICD-10-CM | POA: Diagnosis not present

## 2022-05-20 DIAGNOSIS — E118 Type 2 diabetes mellitus with unspecified complications: Secondary | ICD-10-CM | POA: Diagnosis not present

## 2022-05-20 DIAGNOSIS — L821 Other seborrheic keratosis: Secondary | ICD-10-CM | POA: Diagnosis not present

## 2022-05-20 DIAGNOSIS — M25551 Pain in right hip: Secondary | ICD-10-CM | POA: Diagnosis not present

## 2022-06-03 DIAGNOSIS — M5416 Radiculopathy, lumbar region: Secondary | ICD-10-CM | POA: Diagnosis not present

## 2022-06-03 DIAGNOSIS — D225 Melanocytic nevi of trunk: Secondary | ICD-10-CM | POA: Diagnosis not present

## 2022-06-03 DIAGNOSIS — X32XXXD Exposure to sunlight, subsequent encounter: Secondary | ICD-10-CM | POA: Diagnosis not present

## 2022-06-03 DIAGNOSIS — Z1283 Encounter for screening for malignant neoplasm of skin: Secondary | ICD-10-CM | POA: Diagnosis not present

## 2022-06-03 DIAGNOSIS — L570 Actinic keratosis: Secondary | ICD-10-CM | POA: Diagnosis not present

## 2022-06-11 DIAGNOSIS — Z794 Long term (current) use of insulin: Secondary | ICD-10-CM | POA: Diagnosis not present

## 2022-06-11 DIAGNOSIS — E119 Type 2 diabetes mellitus without complications: Secondary | ICD-10-CM | POA: Diagnosis not present

## 2022-06-28 DIAGNOSIS — M5416 Radiculopathy, lumbar region: Secondary | ICD-10-CM | POA: Diagnosis not present

## 2022-07-11 DIAGNOSIS — E119 Type 2 diabetes mellitus without complications: Secondary | ICD-10-CM | POA: Diagnosis not present

## 2022-07-11 DIAGNOSIS — Z794 Long term (current) use of insulin: Secondary | ICD-10-CM | POA: Diagnosis not present

## 2022-07-16 DIAGNOSIS — M5416 Radiculopathy, lumbar region: Secondary | ICD-10-CM | POA: Diagnosis not present

## 2022-08-10 DIAGNOSIS — E119 Type 2 diabetes mellitus without complications: Secondary | ICD-10-CM | POA: Diagnosis not present

## 2022-08-10 DIAGNOSIS — Z794 Long term (current) use of insulin: Secondary | ICD-10-CM | POA: Diagnosis not present

## 2022-08-27 DIAGNOSIS — Z713 Dietary counseling and surveillance: Secondary | ICD-10-CM | POA: Diagnosis not present

## 2022-08-27 DIAGNOSIS — E13621 Other specified diabetes mellitus with foot ulcer: Secondary | ICD-10-CM | POA: Diagnosis not present

## 2022-08-27 DIAGNOSIS — M545 Low back pain, unspecified: Secondary | ICD-10-CM | POA: Diagnosis not present

## 2022-08-27 DIAGNOSIS — E782 Mixed hyperlipidemia: Secondary | ICD-10-CM | POA: Diagnosis not present

## 2022-08-27 DIAGNOSIS — I739 Peripheral vascular disease, unspecified: Secondary | ICD-10-CM | POA: Diagnosis not present

## 2022-08-27 DIAGNOSIS — E118 Type 2 diabetes mellitus with unspecified complications: Secondary | ICD-10-CM | POA: Diagnosis not present

## 2022-08-27 DIAGNOSIS — I10 Essential (primary) hypertension: Secondary | ICD-10-CM | POA: Diagnosis not present

## 2022-08-27 DIAGNOSIS — E11621 Type 2 diabetes mellitus with foot ulcer: Secondary | ICD-10-CM | POA: Diagnosis not present

## 2022-08-27 DIAGNOSIS — Z79899 Other long term (current) drug therapy: Secondary | ICD-10-CM | POA: Diagnosis not present

## 2022-08-27 DIAGNOSIS — G8929 Other chronic pain: Secondary | ICD-10-CM | POA: Diagnosis not present

## 2022-09-02 ENCOUNTER — Other Ambulatory Visit (HOSPITAL_COMMUNITY): Payer: Self-pay | Admitting: Internal Medicine

## 2022-09-02 ENCOUNTER — Ambulatory Visit (HOSPITAL_COMMUNITY)
Admission: RE | Admit: 2022-09-02 | Discharge: 2022-09-02 | Disposition: A | Discharge status: Home / Self Care | Payer: Medicare Other | Source: Ambulatory Visit | Attending: Internal Medicine | Admitting: Internal Medicine

## 2022-09-02 DIAGNOSIS — M545 Low back pain, unspecified: Secondary | ICD-10-CM | POA: Diagnosis not present

## 2022-09-02 DIAGNOSIS — I739 Peripheral vascular disease, unspecified: Secondary | ICD-10-CM | POA: Diagnosis not present

## 2022-09-02 DIAGNOSIS — M25551 Pain in right hip: Secondary | ICD-10-CM | POA: Diagnosis not present

## 2022-09-02 DIAGNOSIS — L97519 Non-pressure chronic ulcer of other part of right foot with unspecified severity: Secondary | ICD-10-CM | POA: Diagnosis not present

## 2022-09-02 DIAGNOSIS — I10 Essential (primary) hypertension: Secondary | ICD-10-CM | POA: Diagnosis not present

## 2022-09-02 DIAGNOSIS — M7989 Other specified soft tissue disorders: Secondary | ICD-10-CM | POA: Diagnosis not present

## 2022-09-02 DIAGNOSIS — L97509 Non-pressure chronic ulcer of other part of unspecified foot with unspecified severity: Secondary | ICD-10-CM | POA: Diagnosis not present

## 2022-09-02 DIAGNOSIS — G8929 Other chronic pain: Secondary | ICD-10-CM | POA: Diagnosis not present

## 2022-09-02 DIAGNOSIS — E611 Iron deficiency: Secondary | ICD-10-CM | POA: Diagnosis not present

## 2022-09-02 DIAGNOSIS — R809 Proteinuria, unspecified: Secondary | ICD-10-CM | POA: Diagnosis not present

## 2022-09-02 DIAGNOSIS — E782 Mixed hyperlipidemia: Secondary | ICD-10-CM | POA: Diagnosis not present

## 2022-09-02 DIAGNOSIS — E871 Hypo-osmolality and hyponatremia: Secondary | ICD-10-CM | POA: Diagnosis not present

## 2022-09-02 DIAGNOSIS — E13621 Other specified diabetes mellitus with foot ulcer: Secondary | ICD-10-CM | POA: Diagnosis not present

## 2022-09-02 DIAGNOSIS — E11621 Type 2 diabetes mellitus with foot ulcer: Secondary | ICD-10-CM | POA: Diagnosis not present

## 2022-09-13 DIAGNOSIS — E119 Type 2 diabetes mellitus without complications: Secondary | ICD-10-CM | POA: Diagnosis not present

## 2022-09-13 DIAGNOSIS — Z794 Long term (current) use of insulin: Secondary | ICD-10-CM | POA: Diagnosis not present

## 2022-09-29 DIAGNOSIS — L97529 Non-pressure chronic ulcer of other part of left foot with unspecified severity: Secondary | ICD-10-CM | POA: Diagnosis not present

## 2022-09-29 DIAGNOSIS — E1151 Type 2 diabetes mellitus with diabetic peripheral angiopathy without gangrene: Secondary | ICD-10-CM | POA: Diagnosis not present

## 2022-09-29 DIAGNOSIS — E11621 Type 2 diabetes mellitus with foot ulcer: Secondary | ICD-10-CM | POA: Diagnosis not present

## 2022-09-29 DIAGNOSIS — E13621 Other specified diabetes mellitus with foot ulcer: Secondary | ICD-10-CM | POA: Diagnosis not present

## 2022-09-29 DIAGNOSIS — I739 Peripheral vascular disease, unspecified: Secondary | ICD-10-CM | POA: Diagnosis not present

## 2022-09-30 ENCOUNTER — Ambulatory Visit: Payer: Medicare Other | Admitting: Vascular Surgery

## 2022-09-30 ENCOUNTER — Other Ambulatory Visit: Payer: Self-pay | Admitting: *Deleted

## 2022-09-30 ENCOUNTER — Encounter: Payer: Self-pay | Admitting: Vascular Surgery

## 2022-09-30 ENCOUNTER — Ambulatory Visit (HOSPITAL_COMMUNITY)
Admission: RE | Admit: 2022-09-30 | Discharge: 2022-09-30 | Disposition: A | Discharge status: Home / Self Care | Payer: Medicare Other | Source: Ambulatory Visit | Attending: Vascular Surgery | Admitting: Vascular Surgery

## 2022-09-30 VITALS — BP 146/81 | HR 85 | Temp 98.3°F | Wt 187.0 lb

## 2022-09-30 DIAGNOSIS — I70299 Other atherosclerosis of native arteries of extremities, unspecified extremity: Secondary | ICD-10-CM

## 2022-09-30 DIAGNOSIS — I739 Peripheral vascular disease, unspecified: Secondary | ICD-10-CM | POA: Diagnosis not present

## 2022-09-30 DIAGNOSIS — L97909 Non-pressure chronic ulcer of unspecified part of unspecified lower leg with unspecified severity: Secondary | ICD-10-CM

## 2022-09-30 LAB — VAS US ABI WITH/WO TBI
Left ABI: 0.57
Right ABI: 0.59

## 2022-09-30 NOTE — Progress Notes (Signed)
ASSESSMENT & PLAN   PERIPHERAL ARTERIAL DISEASE WITH ULCERATION LEFT GREAT TOE: This patient has a nonhealing wound of the left great toe.  He has normal femoral pulses but dampened monophasic Doppler signals in both feet with an ABI 57% on the left.  Based on his exam he likely has SFA disease and likely infrapopliteal disease.  I think without revascularization he is at high risk for limb loss.  I have recommended that we proceed with arteriography.   I have reviewed with the patient the indications for arteriography. In addition, I have reviewed the potential complications of arteriography including but not limited to: Bleeding, arterial injury, arterial thrombosis, dye action, renal insufficiency, or other unpredictable medical problems. I have explained to the patient that if we find disease amenable to angioplasty we could potentially address this at the same time. I have discussed the potential complications of angioplasty and stenting, including but not limited to: Bleeding, arterial thrombosis, arterial injury, dissection, or the need for surgical intervention.  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 The patient is on best medical therapy for peripheral arterial disease. The patient has been counseled about the risks of tobacco use in atherosclerotic disease. The patient has been counseled to abstain from any tobacco use. An aortogram with bilateral lower extremity runoff angiography and Left lower extremity intervention and is indicated to better evaluate the patient's lower extremity circulation because of the  limb threatening nature of the patient's diagnosis. Based on the patient's clinical exam and non-invasive data, we anticipate an endovascular intervention in the terminal aortic, iliac, and femoropopliteal vessels. Stenting would be favored because of the improved primary patency of these interventions as compared to  plain balloon angioplasty.   REASON FOR CONSULT:    Diabetic foot ulcer.  The consult is requested by Dr. Nita Sells.  HPI:   Randy Jacobson is a 77 y.o. male who was referred with a nonhealing wound on his left great toe.  He states that he has had this wound for about 5 weeks.  I believe he stubbed his toe when this all began.  He does admit to bilateral calf claudication and some occasional rest pain at night.  His risk factors for peripheral arterial disease include type 2 diabetes, hypertension, hypercholesterolemia, and a remote history of tobacco use.  He quit in 1995.  He denies any family history of premature cardiovascular disease.  He is on Plavix and is on a statin.  He does not take aspirin.  He has a history of a GI bleed.  He has undergone previous PTCA.  He cannot remember who his cardiologist is.  Past Medical History:  Diagnosis Date   Diabetes mellitus without complication (HCC)    Hypertension     Family History  Problem Relation Age of Onset   Diabetes Brother     SOCIAL HISTORY: Social History   Tobacco Use   Smoking status: Former    Packs/day: 1    Types: Cigarettes    Start date: 11/20/1957    Quit date: 11/20/1993    Years since quitting: 28.8   Smokeless tobacco: Never  Substance Use Topics   Alcohol use: Yes    Alcohol/week: 0.0 standard drinks of alcohol    Comment: occ    No Known Allergies  Current Outpatient Medications  Medication Sig Dispense Refill   albuterol (VENTOLIN HFA) 108 (90 Base) MCG/ACT inhaler Inhale into the lungs.  budesonide-formoterol (SYMBICORT) 160-4.5 MCG/ACT inhaler Inhale 2 puffs into the lungs 2 (two) times daily. Rinse mouth with water after each use 1 each 1   clopidogrel (PLAVIX) 75 MG tablet Take 75 mg by mouth daily.     fluticasone (FLONASE) 50 MCG/ACT nasal spray Place 1 spray into both nostrils 2 (two) times daily. 16 g 2   glimepiride (AMARYL) 4 MG tablet Take 4 mg by mouth 2 (two) times daily.       hydrochlorothiazide (HYDRODIURIL) 25 MG tablet Take 25 mg by mouth daily.     JANUVIA 100 MG tablet Take 100 mg by mouth daily.     metFORMIN (GLUCOPHAGE) 1000 MG tablet Take 1,000 mg by mouth 2 (two) times daily with a meal.     Multiple Vitamin (MULTIVITAMIN WITH MINERALS) TABS tablet Take 1 tablet by mouth daily.     promethazine-dextromethorphan (PROMETHAZINE-DM) 6.25-15 MG/5ML syrup Take 5 mLs by mouth 4 (four) times daily as needed. 100 mL 0   simvastatin (ZOCOR) 40 MG tablet Take 40 mg by mouth every evening.      No current facility-administered medications for this visit.    REVIEW OF SYSTEMS:  [X]  denotes positive finding, [ ]  denotes negative finding Cardiac  Comments:  Chest pain or chest pressure:    Shortness of breath upon exertion: x   Short of breath when lying flat:    Irregular heart rhythm:        Vascular    Pain in calf, thigh, or hip brought on by ambulation: x   Pain in feet at night that wakes you up from your sleep:  x   Blood clot in your veins:    Leg swelling:         Pulmonary    Oxygen at home:    Productive cough:     Wheezing:         Neurologic    Sudden weakness in arms or legs:     Sudden numbness in arms or legs:     Sudden onset of difficulty speaking or slurred speech:    Temporary loss of vision in one eye:     Problems with dizziness:         Gastrointestinal    Blood in stool:     Vomited blood:         Genitourinary    Burning when urinating:     Blood in urine:        Psychiatric    Major depression:         Hematologic    Bleeding problems:    Problems with blood clotting too easily:        Skin    Rashes or ulcers: x Left great toe      Constitutional    Fever or chills:    -  PHYSICAL EXAM:   Vitals:   09/30/22 1318  BP: (!) 146/81  Pulse: 85  Temp: 98.3 F (36.8 C)  TempSrc: Temporal  SpO2: 95%  Weight: 187 lb (84.8 kg)   Body mass index is 26.08 kg/m. GENERAL: The patient is a well-nourished male,  in no acute distress. The vital signs are documented above. CARDIAC: There is a regular rate and rhythm.  VASCULAR: I do not detect carotid bruits. On the right side he has a palpable femoral pulse.  I cannot palpate a popliteal or pedal pulses. On the left side, which is the site of concern, he has a palpable femoral pulse.  I cannot palpate a popliteal or pedal pulses. He has no significant lower extremity swelling. PULMONARY: There is good air exchange bilaterally without wheezing or rales. ABDOMEN: Soft and non-tender with normal pitched bowel sounds.  I do not palpate an aneurysm. MUSCULOSKELETAL: There are no major deformities. NEUROLOGIC: No focal weakness or paresthesias are detected. SKIN: He has a wound on the left great toe as documented in the photograph below.  PSYCHIATRIC: The patient has a normal affect.  DATA:    ARTERIAL DOPPLER STUDY: I have independently interpreted his arterial Doppler study today.  On the right side there is a monophasic posterior tibial and dorsalis pedis signal.  ABIs 59%.  Toe pressures 63 mmHg.  On the left side there is a monophasic posterior tibial and dorsalis pedis signal.  ABIs 57%.  A toe pressure could not be obtained.  Waverly Ferrari [facility]

## 2022-10-04 ENCOUNTER — Telehealth: Payer: Self-pay

## 2022-10-04 NOTE — Telephone Encounter (Signed)
Attempted to reach patient to schedule aortogram. Left VM for patient to return call.  

## 2022-10-05 ENCOUNTER — Other Ambulatory Visit: Payer: Self-pay

## 2022-10-05 ENCOUNTER — Other Ambulatory Visit: Payer: Self-pay | Admitting: Vascular Surgery

## 2022-10-05 DIAGNOSIS — I7025 Atherosclerosis of native arteries of other extremities with ulceration: Secondary | ICD-10-CM

## 2022-10-05 MED ORDER — CEPHALEXIN 500 MG PO CAPS: 500.0000 mg | ORAL_CAPSULE | Freq: Three times a day (TID) | ORAL | 0 refills | Status: DC

## 2022-10-05 NOTE — Progress Notes (Signed)
I sent him a prescription for Keflex.  Chris Ignace Mandigo, MD 11:42 AM  

## 2022-10-05 NOTE — H&P (View-Only) (Signed)
I sent him a prescription for Keflex.  Cari Caraway, MD 11:42 AM

## 2022-10-05 NOTE — Telephone Encounter (Signed)
Spoke with patient and scheduled aortogram for 7/1. Instructions provided- patient verbalized understanding.

## 2022-10-11 ENCOUNTER — Inpatient Hospital Stay (HOSPITAL_COMMUNITY)
Admission: RE | Admit: 2022-10-11 | Discharge: 2022-10-12 | DRG: 253 | Disposition: A | Discharge status: Home / Self Care | Payer: Medicare Other | Attending: Vascular Surgery | Admitting: Vascular Surgery

## 2022-10-11 ENCOUNTER — Encounter (HOSPITAL_COMMUNITY): Payer: Medicare Other

## 2022-10-11 ENCOUNTER — Other Ambulatory Visit: Payer: Self-pay

## 2022-10-11 ENCOUNTER — Ambulatory Visit (HOSPITAL_COMMUNITY): Payer: Medicare Other

## 2022-10-11 ENCOUNTER — Encounter (HOSPITAL_COMMUNITY)
Admission: RE | Disposition: A | Discharge status: Home / Self Care | Payer: Self-pay | Source: Home / Self Care | Attending: Vascular Surgery

## 2022-10-11 DIAGNOSIS — Z87891 Personal history of nicotine dependence: Secondary | ICD-10-CM

## 2022-10-11 DIAGNOSIS — Z7902 Long term (current) use of antithrombotics/antiplatelets: Secondary | ICD-10-CM | POA: Diagnosis not present

## 2022-10-11 DIAGNOSIS — R55 Syncope and collapse: Secondary | ICD-10-CM | POA: Diagnosis not present

## 2022-10-11 DIAGNOSIS — Z7984 Long term (current) use of oral hypoglycemic drugs: Secondary | ICD-10-CM

## 2022-10-11 DIAGNOSIS — R079 Chest pain, unspecified: Secondary | ICD-10-CM | POA: Diagnosis not present

## 2022-10-11 DIAGNOSIS — Z7951 Long term (current) use of inhaled steroids: Secondary | ICD-10-CM | POA: Diagnosis not present

## 2022-10-11 DIAGNOSIS — I10 Essential (primary) hypertension: Secondary | ICD-10-CM | POA: Diagnosis not present

## 2022-10-11 DIAGNOSIS — I4510 Unspecified right bundle-branch block: Secondary | ICD-10-CM | POA: Diagnosis present

## 2022-10-11 DIAGNOSIS — Z01818 Encounter for other preprocedural examination: Secondary | ICD-10-CM

## 2022-10-11 DIAGNOSIS — I70245 Atherosclerosis of native arteries of left leg with ulceration of other part of foot: Secondary | ICD-10-CM | POA: Diagnosis not present

## 2022-10-11 DIAGNOSIS — Z833 Family history of diabetes mellitus: Secondary | ICD-10-CM

## 2022-10-11 DIAGNOSIS — I7025 Atherosclerosis of native arteries of other extremities with ulceration: Secondary | ICD-10-CM

## 2022-10-11 DIAGNOSIS — I739 Peripheral vascular disease, unspecified: Principal | ICD-10-CM | POA: Diagnosis present

## 2022-10-11 DIAGNOSIS — F0390 Unspecified dementia without behavioral disturbance: Secondary | ICD-10-CM | POA: Diagnosis present

## 2022-10-11 DIAGNOSIS — I70262 Atherosclerosis of native arteries of extremities with gangrene, left leg: Secondary | ICD-10-CM | POA: Diagnosis present

## 2022-10-11 DIAGNOSIS — Z79899 Other long term (current) drug therapy: Secondary | ICD-10-CM

## 2022-10-11 DIAGNOSIS — I7090 Unspecified atherosclerosis: Secondary | ICD-10-CM | POA: Diagnosis not present

## 2022-10-11 DIAGNOSIS — E785 Hyperlipidemia, unspecified: Secondary | ICD-10-CM | POA: Diagnosis present

## 2022-10-11 DIAGNOSIS — E1152 Type 2 diabetes mellitus with diabetic peripheral angiopathy with gangrene: Secondary | ICD-10-CM | POA: Diagnosis not present

## 2022-10-11 DIAGNOSIS — I708 Atherosclerosis of other arteries: Secondary | ICD-10-CM | POA: Diagnosis present

## 2022-10-11 HISTORY — PX: ABDOMINAL AORTOGRAM W/LOWER EXTREMITY: CATH118223

## 2022-10-11 HISTORY — PX: PERIPHERAL VASCULAR INTERVENTION: CATH118257

## 2022-10-11 LAB — POCT I-STAT, CHEM 8
BUN: 28 mg/dL — ABNORMAL HIGH (ref 8–23)
Calcium, Ion: 1.17 mmol/L (ref 1.15–1.40)
Chloride: 104 mmol/L (ref 98–111)
Creatinine, Ser: 0.8 mg/dL (ref 0.61–1.24)
Glucose, Bld: 90 mg/dL (ref 70–99)
HCT: 34 % — ABNORMAL LOW (ref 39.0–52.0)
Hemoglobin: 11.6 g/dL — ABNORMAL LOW (ref 13.0–17.0)
Potassium: 5.3 mmol/L — ABNORMAL HIGH (ref 3.5–5.1)
Sodium: 141 mmol/L (ref 135–145)
TCO2: 29 mmol/L (ref 22–32)

## 2022-10-11 LAB — BASIC METABOLIC PANEL
Anion gap: 6 (ref 5–15)
BUN: 16 mg/dL (ref 8–23)
CO2: 25 mmol/L (ref 22–32)
Calcium: 8.3 mg/dL — ABNORMAL LOW (ref 8.9–10.3)
Chloride: 106 mmol/L (ref 98–111)
Creatinine, Ser: 0.8 mg/dL (ref 0.61–1.24)
GFR, Estimated: >60 mL/min (ref >60)
Glucose, Bld: 265 mg/dL — ABNORMAL HIGH (ref 70–99)
Potassium: 4.1 mmol/L (ref 3.5–5.1)
Sodium: 137 mmol/L (ref 135–145)

## 2022-10-11 LAB — GLUCOSE, CAPILLARY
Glucose-Capillary: 71 mg/dL (ref 70–99)
Glucose-Capillary: 159 mg/dL — ABNORMAL HIGH (ref 70–99)

## 2022-10-11 LAB — TROPONIN I (HIGH SENSITIVITY)
Troponin I (High Sensitivity): 11 ng/L (ref <18)
Troponin I (High Sensitivity): 22 ng/L — ABNORMAL HIGH (ref <18)

## 2022-10-11 LAB — HEMOGLOBIN A1C
Hgb A1c MFr Bld: 6.5 % — ABNORMAL HIGH (ref 4.8–5.6)
Mean Plasma Glucose: 139.85 mg/dL

## 2022-10-11 LAB — TSH: TSH: 0.362 u[IU]/mL (ref 0.350–4.500)

## 2022-10-11 LAB — MAGNESIUM: Magnesium: 1.8 mg/dL (ref 1.7–2.4)

## 2022-10-11 SURGERY — ABDOMINAL AORTOGRAM W/LOWER EXTREMITY
Anesthesia: LOCAL

## 2022-10-11 MED ORDER — PANTOPRAZOLE SODIUM 40 MG PO TBEC
40.0000 mg | DELAYED_RELEASE_TABLET | Freq: Every day | ORAL | Status: DC
  Administered 2022-10-12: 40 mg via ORAL
  Filled 2022-10-11: qty 1

## 2022-10-11 MED ORDER — LABETALOL HCL 5 MG/ML IV SOLN
INTRAVENOUS | Status: AC
  Filled 2022-10-11: qty 4

## 2022-10-11 MED ORDER — LOSARTAN POTASSIUM 50 MG PO TABS
50.0000 mg | ORAL_TABLET | Freq: Every day | ORAL | Status: DC
  Administered 2022-10-12: 50 mg via ORAL
  Filled 2022-10-11: qty 1

## 2022-10-11 MED ORDER — PROTAMINE SULFATE 10 MG/ML IV SOLN
INTRAVENOUS | Status: DC | PRN
  Administered 2022-10-11: 35 mg via INTRAVENOUS
  Administered 2022-10-11: 5 mg via INTRAVENOUS

## 2022-10-11 MED ORDER — SODIUM CHLORIDE 0.9 % IV SOLN: INTRAVENOUS | Status: DC

## 2022-10-11 MED ORDER — GABAPENTIN 300 MG PO CAPS
300.0000 mg | ORAL_CAPSULE | Freq: Every day | ORAL | Status: DC
  Administered 2022-10-11: 300 mg via ORAL
  Filled 2022-10-11: qty 1

## 2022-10-11 MED ORDER — SODIUM CHLORIDE 0.9% FLUSH
3.0000 mL | Freq: Two times a day (BID) | INTRAVENOUS | Status: DC
  Administered 2022-10-12 (×2): 3 mL via INTRAVENOUS

## 2022-10-11 MED ORDER — PROTAMINE SULFATE 10 MG/ML IV SOLN
INTRAVENOUS | Status: AC
  Filled 2022-10-11: qty 5

## 2022-10-11 MED ORDER — PHENOL 1.4 % MT LIQD: 1.0000 | OROMUCOSAL | Status: DC | PRN

## 2022-10-11 MED ORDER — ONDANSETRON HCL 4 MG/2ML IJ SOLN
4.0000 mg | Freq: Four times a day (QID) | INTRAMUSCULAR | Status: DC | PRN
Start: 2022-10-11 — End: 2022-10-11

## 2022-10-11 MED ORDER — LABETALOL HCL 5 MG/ML IV SOLN
10.0000 mg | INTRAVENOUS | Status: DC | PRN
  Administered 2022-10-11 – 2022-10-12 (×2): 10 mg via INTRAVENOUS
  Filled 2022-10-11 (×2): qty 4

## 2022-10-11 MED ORDER — LABETALOL HCL 5 MG/ML IV SOLN: 10.0000 mg | INTRAVENOUS | Status: DC | PRN

## 2022-10-11 MED ORDER — HYDROCHLOROTHIAZIDE 25 MG PO TABS
25.0000 mg | ORAL_TABLET | Freq: Every day | ORAL | Status: DC
  Administered 2022-10-12: 25 mg via ORAL
  Filled 2022-10-11: qty 1

## 2022-10-11 MED ORDER — ALUM & MAG HYDROXIDE-SIMETH 200-200-20 MG/5ML PO SUSP: 15.0000 mL | ORAL | Status: DC | PRN

## 2022-10-11 MED ORDER — ATORVASTATIN CALCIUM 40 MG PO TABS
40.0000 mg | ORAL_TABLET | Freq: Every day | ORAL | Status: DC
  Administered 2022-10-12: 40 mg via ORAL
  Filled 2022-10-11: qty 1

## 2022-10-11 MED ORDER — HYDRALAZINE HCL 20 MG/ML IJ SOLN
INTRAMUSCULAR | Status: AC
  Filled 2022-10-11: qty 1

## 2022-10-11 MED ORDER — ASPIRIN 81 MG PO CHEW: CHEWABLE_TABLET | ORAL | Status: DC | PRN

## 2022-10-11 MED ORDER — SODIUM CHLORIDE 0.9% FLUSH: 3.0000 mL | INTRAVENOUS | Status: DC | PRN

## 2022-10-11 MED ORDER — CLOPIDOGREL BISULFATE 300 MG PO TABS
ORAL_TABLET | ORAL | Status: DC | PRN
  Administered 2022-10-11: 75 mg via ORAL

## 2022-10-11 MED ORDER — SODIUM CHLORIDE 0.9 % IV SOLN: 250.0000 mL | INTRAVENOUS | Status: DC | PRN

## 2022-10-11 MED ORDER — ALBUTEROL SULFATE HFA 108 (90 BASE) MCG/ACT IN AERS: 1.0000 | INHALATION_SPRAY | Freq: Four times a day (QID) | RESPIRATORY_TRACT | Status: DC | PRN

## 2022-10-11 MED ORDER — ACETAMINOPHEN 325 MG PO TABS: 325.0000 mg | ORAL_TABLET | ORAL | Status: DC | PRN

## 2022-10-11 MED ORDER — OXYCODONE-ACETAMINOPHEN 5-325 MG PO TABS: 1.0000 | ORAL_TABLET | Freq: Four times a day (QID) | ORAL | Status: DC | PRN

## 2022-10-11 MED ORDER — POTASSIUM CHLORIDE CRYS ER 20 MEQ PO TBCR: 20.0000 meq | EXTENDED_RELEASE_TABLET | Freq: Once | ORAL | Status: DC

## 2022-10-11 MED ORDER — CLOPIDOGREL BISULFATE 75 MG PO TABS
ORAL_TABLET | ORAL | Status: AC
  Filled 2022-10-11: qty 1

## 2022-10-11 MED ORDER — CELECOXIB 200 MG PO CAPS
200.0000 mg | ORAL_CAPSULE | Freq: Every day | ORAL | Status: DC
  Administered 2022-10-12: 200 mg via ORAL
  Filled 2022-10-11 (×2): qty 1

## 2022-10-11 MED ORDER — SODIUM CHLORIDE 0.9 % WEIGHT BASED INFUSION
1.0000 mL/kg/h | INTRAVENOUS | Status: AC
  Administered 2022-10-11: 1 mL/kg/h via INTRAVENOUS

## 2022-10-11 MED ORDER — IODIXANOL 320 MG/ML IV SOLN
INTRAVENOUS | Status: DC | PRN
  Administered 2022-10-11: 100 mL via INTRA_ARTERIAL

## 2022-10-11 MED ORDER — LABETALOL HCL 5 MG/ML IV SOLN
INTRAVENOUS | Status: DC | PRN
  Administered 2022-10-11: 10 mg via INTRAVENOUS

## 2022-10-11 MED ORDER — METOPROLOL TARTRATE 5 MG/5ML IV SOLN: 2.0000 mg | INTRAVENOUS | Status: DC | PRN

## 2022-10-11 MED ORDER — HEPARIN SODIUM (PORCINE) 1000 UNIT/ML IJ SOLN
INTRAMUSCULAR | Status: AC
  Filled 2022-10-11: qty 10

## 2022-10-11 MED ORDER — LIDOCAINE HCL (PF) 1 % IJ SOLN
INTRAMUSCULAR | Status: DC | PRN
  Administered 2022-10-11: 15 mL via INTRADERMAL

## 2022-10-11 MED ORDER — ATROPINE SULFATE 1 MG/10ML IJ SOSY
PREFILLED_SYRINGE | INTRAMUSCULAR | Status: AC
  Filled 2022-10-11: qty 10

## 2022-10-11 MED ORDER — ACETAMINOPHEN 650 MG RE SUPP: 325.0000 mg | RECTAL | Status: DC | PRN

## 2022-10-11 MED ORDER — HYDRALAZINE HCL 20 MG/ML IJ SOLN: 5.0000 mg | INTRAMUSCULAR | Status: DC | PRN

## 2022-10-11 MED ORDER — HYDRALAZINE HCL 20 MG/ML IJ SOLN
INTRAMUSCULAR | Status: DC | PRN
  Administered 2022-10-11: 10 mg via INTRAVENOUS

## 2022-10-11 MED ORDER — MELATONIN 5 MG PO TABS
10.0000 mg | ORAL_TABLET | Freq: Every evening | ORAL | Status: DC | PRN
  Administered 2022-10-11: 10 mg via ORAL
  Filled 2022-10-11: qty 2

## 2022-10-11 MED ORDER — INSULIN ASPART 100 UNIT/ML IJ SOLN
0.0000 [IU] | Freq: Three times a day (TID) | INTRAMUSCULAR | Status: DC
  Administered 2022-10-12: 2 [IU] via SUBCUTANEOUS

## 2022-10-11 MED ORDER — FENTANYL CITRATE (PF) 100 MCG/2ML IJ SOLN
INTRAMUSCULAR | Status: DC | PRN
  Administered 2022-10-11: 50 ug via INTRAVENOUS

## 2022-10-11 MED ORDER — MIDAZOLAM HCL 2 MG/2ML IJ SOLN
INTRAMUSCULAR | Status: DC | PRN
  Administered 2022-10-11: 1 mg via INTRAVENOUS

## 2022-10-11 MED ORDER — ONDANSETRON HCL 4 MG/2ML IJ SOLN: 4.0000 mg | Freq: Four times a day (QID) | INTRAMUSCULAR | Status: DC | PRN

## 2022-10-11 MED ORDER — CLOPIDOGREL BISULFATE 75 MG PO TABS
75.0000 mg | ORAL_TABLET | Freq: Every day | ORAL | Status: DC
  Administered 2022-10-12: 75 mg via ORAL
  Filled 2022-10-11: qty 1

## 2022-10-11 MED ORDER — HEPARIN SODIUM (PORCINE) 1000 UNIT/ML IJ SOLN
INTRAMUSCULAR | Status: DC | PRN
  Administered 2022-10-11: 8000 [IU] via INTRAVENOUS

## 2022-10-11 MED ORDER — FENTANYL CITRATE (PF) 100 MCG/2ML IJ SOLN
INTRAMUSCULAR | Status: AC
  Filled 2022-10-11: qty 2

## 2022-10-11 MED ORDER — LIDOCAINE HCL (PF) 1 % IJ SOLN
INTRAMUSCULAR | Status: AC
  Filled 2022-10-11: qty 30

## 2022-10-11 MED ORDER — HEPARIN (PORCINE) IN NACL 1000-0.9 UT/500ML-% IV SOLN
INTRAVENOUS | Status: DC | PRN
  Administered 2022-10-11: 1000 mL

## 2022-10-11 MED ORDER — GUAIFENESIN-DM 100-10 MG/5ML PO SYRP: 15.0000 mL | ORAL_SOLUTION | ORAL | Status: DC | PRN

## 2022-10-11 MED ORDER — MIDAZOLAM HCL 2 MG/2ML IJ SOLN
INTRAMUSCULAR | Status: AC
  Filled 2022-10-11: qty 2

## 2022-10-11 MED ORDER — ACETAMINOPHEN 325 MG PO TABS
650.0000 mg | ORAL_TABLET | ORAL | Status: DC | PRN
Start: 2022-10-11 — End: 2022-10-11

## 2022-10-11 SURGICAL SUPPLY — 24 items
BALLN MUSTANG 6X60X135 (BALLOONS) ×2
BALLN MUSTANG 8X60X135 (BALLOONS) ×2
BALLN MUSTANG 9X60X135 (BALLOONS) ×2
BALLOON MUSTANG 6X60X135 (BALLOONS) IMPLANT
BALLOON MUSTANG 8X60X135 (BALLOONS) IMPLANT
BALLOON MUSTANG 9X60X135 (BALLOONS) IMPLANT
CATH OMNI FLUSH 5F 65CM (CATHETERS) IMPLANT
CATH STRAIGHT 5FR 65CM (CATHETERS) IMPLANT
CATH TEMPO AQUA 5F 100CM (CATHETERS) IMPLANT
GLIDEWIRE ADV .035X260CM (WIRE) IMPLANT
KIT ENCORE 26 ADVANTAGE (KITS) IMPLANT
KIT MICROPUNCTURE NIT STIFF (SHEATH) IMPLANT
KIT PV (KITS) ×2 IMPLANT
SHEATH CATAPULT 6FR 60 (SHEATH) IMPLANT
SHEATH PINNACLE 5F 10CM (SHEATH) IMPLANT
SHEATH PINNACLE 6F 10CM (SHEATH) IMPLANT
SHEATH PROBE COVER 6X72 (BAG) IMPLANT
STENT EPIC VASCULAR 9X60X75 (Permanent Stent) IMPLANT
STOPCOCK MORSE 400PSI 3WAY (MISCELLANEOUS) IMPLANT
SYR MEDRAD MARK 7 150ML (SYRINGE) ×2 IMPLANT
TRANSDUCER W/STOPCOCK (MISCELLANEOUS) ×2 IMPLANT
TRAY PV CATH (CUSTOM PROCEDURE TRAY) ×2 IMPLANT
TUBING CIL FLEX 10 FLL-RA (TUBING) IMPLANT
WIRE BENTSON .035X145CM (WIRE) IMPLANT

## 2022-10-11 NOTE — Consult Note (Signed)
Cardiology Consultation   Patient ID: Randy Jacobson MRN: [id number]; DOB: 23-Apr-1945  Admit date: 10/11/2022 Date of Consult: 10/11/2022  PCP:  Benita Stabile, MD   [facility] Providers Cardiologist:  None   {    Patient Profile:   Randy Jacobson is a 77 y.o. male with a hx of hypertension, type 2 diabetes, PAD, hyperlipidemia, gastritis/gastric ulcer 2018,  who is being seen 10/11/2022 for the evaluation of near syncope at the request of Dr Edilia Bo.  History of Present Illness:   Mr. Randy Jacobson with above past medical history presented to the hospital today for elective arteriography with possible intervention due to nonhealing left great toe wound, with known infrainguinal arterial occlusive disease.  He underwent successful angioplasty and stenting for left external iliac artery stenosis. The superficial femoral artery was occluded at its origin with disease in the common femoral artery.  He was recommended revascularization with left femoral to below -knee popliteal artery bypass this coming Friday.    Per postprocedure documentation, 6 French sheath were removed from right femoral artery followed by manual pressure application.  During the process, patient was complaining lightheadedness, heart rate dropped into 40s, SBP dropped to 76 mmHg. He was resuscitated with IV fluids bolus, heart rate dropping to 20-30bpm, atropine was given, his HR had improved to 70s with BP 140/58.  He then reported intermittent similar pre-syncope episodes over the last several months.  He had remained stable subsequently, cardiology consult is requested for further evaluation of near syncope.  Patient states he had no heart disease in the past, denied any hx of CVA. He noted over the past 3 months, he had intermittent lightheadedness, occurring randomly. He recalls feeling this way while walking or driving, never during resting. He states it would last few minutes and resolves spontaneously. He does not  recall ever having any chest pain, SOB, diaphoresis, nausea, feeling warm, etc. He is overall active at baseline, able to walk up stairs and do house chores and light yard work if needed. He is mostly limited due to foot wound pain lately. He denied tobacco use, drinks ETOH regularly with a few beers most days during the week, no other illicit drug use. He recalls passing out once few years back when his blood count was low at 5 and he had bleeding ulcer.   Per chart review, patient has no known cardiac disease in the past, suffers  peripheral arterial disease requiring peripheral stenting in the past.  He suffered GI bleed while on aspirin and Plavix in 2018, EGD at the time revealing H. pylori gastritis and superficial gastric ulcers.   Blood work today revealed potassium 5.3, BUN to 80, creatinine 0.8.  Hemoglobin 11.6. EKG revealed sinus rhythm 78 bpm, RBBB, no significant change.Telemetry revealed sinus rhythm, sinus bradycardia with HR 20-30s with sinus pause lasting up to 3.8 second noted.     Past Medical History:  Diagnosis Date   Diabetes mellitus without complication (HCC)    Hypertension     Past Surgical History:  Procedure Laterality Date   "stents in both my legs"     GIVENS CAPSULE STUDY N/A 04/10/2017   Procedure: GIVENS CAPSULE STUDY;  Surgeon: Charlott Rakes, MD;  [facility]: [facility] ENDOSCOPY;  Service: Endoscopy;  Laterality: N/A;   IR GENERIC HISTORICAL  08/26/2015   IR RADIOLOGIST EVAL & MGMT 08/26/2015 Malachy Moan, MD [facility] INTERV RAD   IR GENERIC HISTORICAL  04/21/2016   IR RADIOLOGIST EVAL & MGMT 04/21/2016 Heath  Archer Asa, MD [facility] INTERV RAD     Home Medications:  Prior to Admission medications   Medication Sig Start Date End Date Taking? Authorizing Provider  atorvastatin (LIPITOR) 40 MG tablet Take 40 mg by mouth daily.   Yes [provider]  celecoxib (CELEBREX) 200 MG capsule Take 200 mg by mouth daily. 11/15/21  Yes [provider]   cephALEXin (KEFLEX) 500 MG capsule Take 1 capsule (500 mg total) by mouth 3 (three) times daily. 10/05/22  Yes Chuck Hint, MD  clopidogrel (PLAVIX) 75 MG tablet Take 75 mg by mouth daily. 10/30/15  Yes [provider]  Emollient (EUCERIN) lotion Apply 1 Application topically as needed for dry skin.   Yes [provider]  gabapentin (NEURONTIN) 300 MG capsule Take 300 mg by mouth at bedtime. 09/22/22  Yes [provider]  hydrochlorothiazide (HYDRODIURIL) 25 MG tablet Take 25 mg by mouth daily. 02/03/17  Yes [provider]  losartan (COZAAR) 50 MG tablet Take 50 mg by mouth in the morning and at bedtime.   Yes [provider]  Melatonin 10 MG TABS Take 10 mg by mouth at bedtime.   Yes [provider]  metFORMIN (GLUCOPHAGE) 1000 MG tablet Take 1,000 mg by mouth 2 (two) times daily with a meal.   Yes [provider]  mineral oil-hydrophilic petrolatum (AQUAPHOR) ointment Apply 1 Application topically at bedtime.   Yes [provider]  Multiple Vitamin (MULTIVITAMIN WITH MINERALS) TABS tablet Take 1 tablet by mouth daily.   Yes [provider]  pantoprazole (PROTONIX) 40 MG tablet Take 40 mg by mouth daily. 09/02/21  Yes [provider]  TRESIBA FLEXTOUCH 100 UNIT/ML FlexTouch Pen Inject 38 Units into the skin daily.   Yes [provider]  albuterol (VENTOLIN HFA) 108 (90 Base) MCG/ACT inhaler Inhale 1-2 puffs into the lungs every 6 (six) hours as needed for shortness of breath or wheezing. 03/24/21   [provider]  budesonide-formoterol (SYMBICORT) 160-4.5 MCG/ACT inhaler Inhale 2 puffs into the lungs 2 (two) times daily. Rinse mouth with water after each use Patient not taking: Reported on 10/07/2022 11/16/21   Particia Nearing, PA-C  fluticasone [facility]) 50 MCG/ACT nasal spray Place 1 spray into both nostrils 2 (two) times daily. Patient not taking: Reported on 10/07/2022 11/16/21    Particia Nearing, PA-C  promethazine-dextromethorphan (PROMETHAZINE-DM) 6.25-15 MG/5ML syrup Take 5 mLs by mouth 4 (four) times daily as needed. Patient not taking: Reported on 10/07/2022 11/16/21   Particia Nearing, PA-C    Inpatient Medications: Scheduled Meds:  atropine       Continuous Infusions:  sodium chloride 100 mL/hr at 10/11/22 0756   PRN Meds: aspirin, atropine, clopidogrel, fentaNYL, Heparin (Porcine) in NaCl, heparin sodium (porcine), hydrALAZINE, iodixanol, labetalol, lidocaine (PF), midazolam, protamine  Allergies:   No Known Allergies  Social History:   Social History   Socioeconomic History   Marital status: Significant Other    Spouse name: Not on file   Number of children: Not on file   Years of education: Not on file   Highest education level: Not on file  Occupational History   Not on file  Tobacco Use   Smoking status: Former    Packs/day: 1    Types: Cigarettes    Start date: 11/20/1957    Quit date: 11/20/1993    Years since quitting: 28.9   Smokeless tobacco: Never  Vaping Use   Vaping Use: Never used  Substance and Sexual  Activity   Alcohol use: Yes    Alcohol/week: 0.0 standard drinks of alcohol    Comment: occ   Drug use: Never   Sexual activity: Not on file  Other Topics Concern   Not on file  Social History Narrative   Not on file   Social Determinants of Health   Financial Resource Strain: Not on file  Food Insecurity: Not on file  Transportation Needs: Not on file  Physical Activity: Not on file  Stress: Not on file  Social Connections: Not on file  Intimate Partner Violence: Not on file    Family History:    Family History  Problem Relation Age of Onset   Diabetes Brother      ROS:  Constitutional: Denied fever, chills, malaise, night sweats Eyes: Denied vision change or loss Ears/Nose/Mouth/Throat: Denied ear ache, sore throat, coughing, sinus pain Cardiovascular: see HPI  Respiratory: see  HPI Gastrointestinal: Denied nausea, vomiting, abdominal pain, diarrhea Genital/Urinary: Denied dysuria, hematuria, urinary frequency/urgency Musculoskeletal: Denied muscle ache, joint pain, weakness Skin: Denied rash, wound Neuro: see HPI  Psych: Denied history of depression/anxiety  Endocrine: history of diabetes   Physical Exam/Data:   Vitals:   10/11/22 1145 10/11/22 1150 10/11/22 1155 10/11/22 1157  BP: (!) 68/48 (!) 111/49 (!) 105/55 (!) 105/55  Pulse: (!) 55 67 75 73  Resp: 12 12 14 16   Temp:      TempSrc:      SpO2: 92% 97% 97% 98%  Weight:      Height:       No intake or output data in the 24 hours ending 10/11/22 1217    10/11/2022    8:00 AM 09/30/2022    1:18 PM 06/08/2019    9:04 AM  Last 3 Weights  Weight (lbs) 186 lb 187 lb 165 lb  Weight (kg) 84.369 kg 84.823 kg 74.844 kg     Body mass index is 25.94 kg/m.   Vitals:  Vitals:   10/11/22 1230 10/11/22 1245  BP: (!) 135/55 (!) 147/66  Pulse: 74 73  Resp: 14 18  Temp:    SpO2: 99% 100%   General Appearance: In no apparent distress, laying in bed, well nourished  HEENT: Normocephalic, atraumatic. EOMs intact.  Neck: Supple, trachea midline, no JVDs Cardiovascular: Regular rate and rhythm, normal S1-S2,  no murmur Respiratory: Resting breathing unlabored, lungs sounds clear to auscultation bilaterally, no use of accessory muscles. On room air.  No wheezes, rales or rhonchi.   Gastrointestinal: Bowel sounds positive, abdomen soft, non-tender Extremities: BLE no edema, right groin with dressing Musculoskeletal: Normal muscle bulk and tone Skin: Intact, warm, dry. No rashes  Neurologic: Alert, oriented to person, place and time. Fluent speech, no cognitive deficit,  no gross focal neuro deficit Psychiatric: Normal affect. Mood is appropriate.   EKG:  The EKG was personally reviewed and demonstrates:    EKG today showed Sinus rhythm 78 bpm, old RBBB  Telemetry:  Telemetry was personally reviewed and  demonstrates:    Sinus bradycardia/rhythm, sinus pause lasting up to 3.8 second noted   Relevant CV Studies:  None   Laboratory Data:  High Sensitivity Troponin:  No results for input(s): "TROPONINIHS" in the last 720 hours.   Chemistry Recent Labs  Lab 10/11/22 0740  NA 141  K 5.3*  CL 104  GLUCOSE 90  BUN 28*  CREATININE 0.80    No results for input(s): "PROT", "ALBUMIN", "AST", "ALT", "ALKPHOS", "BILITOT" in the last 168 hours. Lipids No results  for input(s): "CHOL", "TRIG", "HDL", "LABVLDL", "LDLCALC", "CHOLHDL" in the last 168 hours.  Hematology Recent Labs  Lab 10/11/22 0740  HGB 11.6*  HCT 34.0*   Thyroid No results for input(s): "TSH", "FREET4" in the last 168 hours.  BNPNo results for input(s): "BNP", "PROBNP" in the last 168 hours.  DDimer No results for input(s): "DDIMER" in the last 168 hours.   Radiology/Studies:  PERIPHERAL VASCULAR CATHETERIZATION  Result Date: 10/11/2022 Table formatting from the original result was not included. Images from the original result were not included. PATIENT: JAKIAH SHEEKS      MRN: [id number] DOB: 05-06-1945    DATE OF PROCEDURE: 10/11/2022 INDICATIONS:  MELBA VISAYA is a 77 y.o. male who presents with a nonhealing wound of his left great toe and evidence of infrainguinal arterial occlusive disease.  He presents for arteriography and possible intervention PROCEDURE:  Conscious sedation Ultrasound-guided access to the right common femoral artery Aortogram with bilateral iliac arteriogram Selective catheterization of the left external iliac artery second-order catheterization with left lower extremity runoff Angioplasty and stenting of the left common iliac artery and external iliac artery SURGEON: Di Kindle. Edilia Bo, MD, FACS ANESTHESIA: Local with sedation EBL: Minimal TECHNIQUE: The patient was brought to the peripheral vascular lab and was sedated. The period of conscious sedation was 85 minutes.  During that time period, I was  present face-to-face 100% of the time.  The patient was administered 1 mg of Versed and 50 mcg of fentanyl. The patient's heart rate, blood pressure, and oxygen saturation were monitored by the nurse continuously during the procedure. Both groins were prepped and draped in the usual sterile fashion.  Under ultrasound guidance, after the skin was anesthetized, I cannulated the right common femoral artery with a micropuncture needle and a micropuncture sheath was introduced over a wire.  This was exchanged for a 5 [place] sheath over a Bentson wire.  By ultrasound the femoral artery was patent.  There was significant plaque noted in the common femoral artery. A real-time image was obtained and sent to the server. The Omni Flush catheter was positioned at the L1 vertebral body and flush aortogram obtained.  The catheter was in position above the aortic bifurcation and an iliac projection was obtained.  The patient had a significant stenosis in the external iliac artery of about 80%.  There was some moderate disease below his previously placed common iliac artery stent on the left.  I elected to address this with angioplasty and stenting. Across the bifurcation with the Omni Flush catheter and advanced a Glidewire advantage down to the common femoral artery.  I then exchanged the 5 French sheath for a 6 [place] catapulted sheath which was positioned in the common iliac artery.  The patient was then heparinized and ACT monitored throughout the procedure.  The patient had a significant external iliac artery stenosis of about 80%.  I predilated this area with a 6 mm x 60 cm Mustang balloon.  I then treated this area with an epic stent (9 mm x 60 mm).  This overlapped the stent in his common iliac artery and extended below the area of concern in the external iliac artery.  This was a bare-metal stent and preserved flow to the hypogastric.  Postdilatation was done with an 8 mm x 60 mm Mustang balloon.  There is 1 area that  there is some mild residual narrowing so I went back with a 9 x 60 balloon which was inflated to 6 atm.  Follow-up film showed an excellent result with no residual stenosis. Next left lower extremity runoff film was obtained.  At the completion the sheath was retracted into the iliac artery on the right and then a wire advanced up into the infrarenal aorta.  The long sheath was exchanged for a short 6 French sheath and the Omni Flush was placed above the aortic bifurcation and an aortogram was obtained which showed no issues with distal common iliac artery stents. Patient was transferred to the holding area for removal of the sheath.  No immediate complications were noted. FINDINGS: The infrarenal aorta is widely patent.  Both common iliac artery stents are patent.  The hypogastric arteries bilaterally are patent.  The right external iliac artery is patent. On the left side, which is the site of concern there was an 80% left external iliac artery stenosis and some moderate disease in the distal common iliac artery which were addressed with angioplasty and stenting as described above.  Below that there was some moderate plaque in the common femoral artery.  The deep femoral artery was patent.  The superficial femoral artery was occluded at its origin.  There is reconstitution of the above-knee popliteal artery but there is an eccentric plaque just above the level of the knee.  The below-knee popliteal segment is patent.  The anterior tibial is patent.  The tibial peroneal trunk, posterior tibial, and peroneal arteries are patent.  There is moderate disease in the dorsalis pedis artery. CLINICAL NOTE: He is undergone successful angioplasty and stenting of the external iliac artery stenosis on the left.  He has a flush occlusion of the superficial femoral artery with disease in the common femoral artery.  Therefore I think the best option for revascularization on the left is a left femoral to below-knee popliteal  artery bypass.  Will get his vein mapped while he is here.  Will try to schedule his surgery for Friday.  I have discussed the indications for the procedure and the potential complications with the patient and he is agreeable to proceed.  All of his questions were answered. WIfI; 2 2 1  Clinical stage IV Amputation risk high Potential benefit of revascularization high Waverly Ferrari, MD, FACS [facility] DATE OF DICTATION:   10/11/2022     Assessment and Plan:   #Near syncope, likely vagally mediated  #Sinus bradycardia with sinus pause >3 seconds #Pre-op risk evaluation  -The patient describes a dizzy spell after femoral sheath removal with manual pressure.  Telemetry shows sinus bradycardia with heart rate in the 20 to 30 bpm range.  A brief sinus pause of less than 3-second duration was noted.  He reports he did not pass out.  He has had several of these episodes of dizziness.  Described as brief.  Occurring randomly.  Unclear if there is situational versus worsening arrhythmia. -He reports no chest pain or trouble breathing.  He will need vascular surgery Friday.  I recommended admission to the hospital for an echocardiogram and nuclear medicine stress test for further risk stratification prior to surgery on Friday. -We will check a full panel of labs including CMP, CBC and TSH.  Would also be a good idea to check his A1c.  No hypoglycemia reported.  CV exam unremarkable.  EKG simply demonstrates sinus rhythm with right bundle branch block.  For questions or updates, please contact [facility] Please consult [email or web address] for contact info under   [employer]. Flora Lipps, MD, [facility]  [facility]  [address] [place], [place] [address] [phone]  1:46 PM

## 2022-10-11 NOTE — Interval H&P Note (Signed)
History and Physical Interval Note:  10/11/2022 9:22 AM  Randy Jacobson  has presented today for surgery, with the diagnosis of pad w/ ulcer.  The various methods of treatment have been discussed with the patient and family. After consideration of risks, benefits and other options for treatment, the patient has consented to  Procedure(s): ABDOMINAL AORTOGRAM W/LOWER EXTREMITY (N/A) as a surgical intervention.  The patient's history has been reviewed, patient examined, no change in status, stable for surgery.  I have reviewed the patient's chart and labs.  Questions were answered to the patient's satisfaction.     Waverly Ferrari

## 2022-10-11 NOTE — Op Note (Signed)
PATIENT: Randy Jacobson      MRN: [id number] DOB: 1946/04/10    DATE OF PROCEDURE: 10/11/2022  INDICATIONS:    Randy Jacobson is a 77 y.o. male who presents with a nonhealing wound of his left great toe and evidence of infrainguinal arterial occlusive disease.  He presents for arteriography and possible intervention  PROCEDURE:    Conscious sedation Ultrasound-guided access to the right common femoral artery Aortogram with bilateral iliac arteriogram Selective catheterization of the left external iliac artery second-order catheterization with left lower extremity runoff Angioplasty and stenting of the left common iliac artery and external iliac artery  SURGEON: Di Kindle. Edilia Bo, MD, FACS  ANESTHESIA: Local with sedation  EBL: Minimal  TECHNIQUE: The patient was brought to the peripheral vascular lab and was sedated. The period of conscious sedation was 85 minutes.  During that time period, I was present face-to-face 100% of the time.  The patient was administered 1 mg of Versed and 50 mcg of fentanyl. The patient's heart rate, blood pressure, and oxygen saturation were monitored by the nurse continuously during the procedure.  Both groins were prepped and draped in the usual sterile fashion.  Under ultrasound guidance, after the skin was anesthetized, I cannulated the right common femoral artery with a micropuncture needle and a micropuncture sheath was introduced over a wire.  This was exchanged for a 5 [place] sheath over a Bentson wire.  By ultrasound the femoral artery was patent.  There was significant plaque noted in the common femoral artery. A real-time image was obtained and sent to the server.  The Omni Flush catheter was positioned at the L1 vertebral body and flush aortogram obtained.  The catheter was in position above the aortic bifurcation and an iliac projection was obtained.  The patient had a significant stenosis in the external iliac artery of about 80%.  There was  some moderate disease below his previously placed common iliac artery stent on the left.  I elected to address this with angioplasty and stenting.  Across the bifurcation with the Omni Flush catheter and advanced a Glidewire advantage down to the common femoral artery.  I then exchanged the 5 French sheath for a 6 [place] catapulted sheath which was positioned in the common iliac artery.  The patient was then heparinized and ACT monitored throughout the procedure.  The patient had a significant external iliac artery stenosis of about 80%.  I predilated this area with a 6 mm x 60 cm Mustang balloon.  I then treated this area with an epic stent (9 mm x 60 mm).  This overlapped the stent in his common iliac artery and extended below the area of concern in the external iliac artery.  This was a bare-metal stent and preserved flow to the hypogastric.  Postdilatation was done with an 8 mm x 60 mm Mustang balloon.  There is 1 area that there is some mild residual narrowing so I went back with a 9 x 60 balloon which was inflated to 6 atm.  Follow-up film showed an excellent result with no residual stenosis.  Next left lower extremity runoff film was obtained.  At the completion the sheath was retracted into the iliac artery on the right and then a wire advanced up into the infrarenal aorta.  The long sheath was exchanged for a short 6 French sheath and the Omni Flush was placed above the aortic bifurcation and an aortogram was obtained which showed no issues with distal common iliac artery  stents.  Patient was transferred to the holding area for removal of the sheath.  No immediate complications were noted.  FINDINGS:   The infrarenal aorta is widely patent.  Both common iliac artery stents are patent.  The hypogastric arteries bilaterally are patent.  The right external iliac artery is patent. On the left side, which is the site of concern there was an 80% left external iliac artery stenosis and some moderate  disease in the distal common iliac artery which were addressed with angioplasty and stenting as described above.  Below that there was some moderate plaque in the common femoral artery.  The deep femoral artery was patent.  The superficial femoral artery was occluded at its origin.  There is reconstitution of the above-knee popliteal artery but there is an eccentric plaque just above the level of the knee.  The below-knee popliteal segment is patent.  The anterior tibial is patent.  The tibial peroneal trunk, posterior tibial, and peroneal arteries are patent.  There is moderate disease in the dorsalis pedis artery.  CLINICAL NOTE: He is undergone successful angioplasty and stenting of the external iliac artery stenosis on the left.  He has a flush occlusion of the superficial femoral artery with disease in the common femoral artery.  Therefore I think the best option for revascularization on the left is a left femoral to below-knee popliteal artery bypass.  Will get his vein mapped while he is here.  Will try to schedule his surgery for Friday.  I have discussed the indications for the procedure and the potential complications with the patient and he is agreeable to proceed.  All of his questions were answered.  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

## 2022-10-11 NOTE — Progress Notes (Signed)
VASCULAR SURGERY:  The patient had an episode of bradycardia and hypotension when his sheath was pulled.  He responded quickly to atropine.  On further questioning the patient admits to several episodes of dizziness including one 3 days ago.  For this reason I have asked cardiology to see.  We may potentially have to admit the patient pending their recommendations.  In addition we may have to hold off on surgery Friday again pending their recommendations.  I will update his significant other.  Cari Caraway, MD 12:27 PM

## 2022-10-11 NOTE — Progress Notes (Signed)
61fr sheath aspirated and removed from right femoral artery, manual pressure applied for 30 minutes.  During manual pressure, patient felt faint, HR dropped in the 40s  systolic pressure in .  Fluids at 999.  HR dropped to the 20-30bpm.  1/2 amp of atropine given by Andrey Spearman R.N.   Hr back up to 75bpm, bp 140/58  Dr. Edilia Bo notified.  Patient feels better, upon questioning him, he says this happens every couple of weeks,for the past few months sometimes while driving.  He sometimes looks for a place to pull his vehicle over. He states he has not told anyone, or his physician at his appointments.   Groin level 0 no s+s of hematoma, tegaderm dressing applied, bedrest instructions given.    Left dp pulse present with doppler, pt very faint.  Right dp faint, Right pt present with doppler.  Bedrest begins at 11:55:00.  Dr. Edilia Bo in to see patient , post groin hold.

## 2022-10-12 ENCOUNTER — Inpatient Hospital Stay (HOSPITAL_COMMUNITY): Payer: Medicare Other

## 2022-10-12 ENCOUNTER — Encounter (HOSPITAL_COMMUNITY): Payer: Self-pay | Admitting: Vascular Surgery

## 2022-10-12 DIAGNOSIS — R55 Syncope and collapse: Secondary | ICD-10-CM

## 2022-10-12 DIAGNOSIS — I7090 Unspecified atherosclerosis: Secondary | ICD-10-CM

## 2022-10-12 LAB — ECHOCARDIOGRAM COMPLETE
AR max vel: 1.95 cm2
AV Area VTI: 2.21 cm2
AV Area mean vel: 1.94 cm2
AV Mean grad: 3 mmHg
AV Peak grad: 6.1 mmHg
Ao pk vel: 1.23 m/s
Height: 71 in
S' Lateral: 3 cm
Weight: 2976 oz

## 2022-10-12 LAB — NM MYOCAR MULTI W/SPECT W/WALL MOTION / EF
Estimated workload: 1
Exercise duration (min): 0 min
Exercise duration (sec): 0 s
MPHR: 142 {beats}/min
Peak HR: 118 {beats}/min
Percent HR: 83 %
RPE: 0
Rest HR: 72 {beats}/min
ST Depression (mm): 0 mm

## 2022-10-12 LAB — LIPID PANEL
Cholesterol: 109 mg/dL (ref 0–200)
HDL: 36 mg/dL — ABNORMAL LOW (ref >40)
LDL Cholesterol: 49 mg/dL (ref 0–99)
Total CHOL/HDL Ratio: 3 RATIO
Triglycerides: 121 mg/dL (ref <150)
VLDL: 24 mg/dL (ref 0–40)

## 2022-10-12 LAB — GLUCOSE, CAPILLARY: Glucose-Capillary: 135 mg/dL — ABNORMAL HIGH (ref 70–99)

## 2022-10-12 LAB — POCT ACTIVATED CLOTTING TIME
Activated Clotting Time: 299 seconds
Activated Clotting Time: 159 seconds

## 2022-10-12 MED ORDER — TECHNETIUM TC 99M TETROFOSMIN IV KIT
11.0000 | PACK | Freq: Once | INTRAVENOUS | Status: AC | PRN
  Administered 2022-10-12: 11 via INTRAVENOUS

## 2022-10-12 MED ORDER — REGADENOSON 0.4 MG/5ML IV SOLN
INTRAVENOUS | Status: AC
  Filled 2022-10-12: qty 5

## 2022-10-12 MED ORDER — REGADENOSON 0.4 MG/5ML IV SOLN
0.4000 mg | Freq: Once | INTRAVENOUS | Status: AC
  Administered 2022-10-12: 0.4 mg via INTRAVENOUS
  Filled 2022-10-12: qty 5

## 2022-10-12 MED ORDER — TECHNETIUM TC 99M TETROFOSMIN IV KIT
32.9000 | PACK | Freq: Once | INTRAVENOUS | Status: AC | PRN
  Administered 2022-10-12: 32.9 via INTRAVENOUS

## 2022-10-12 NOTE — Progress Notes (Signed)
Left lower extremity vein mapping study completed.   Please see CV Procedures for preliminary results.  Omid Deardorff, RVT  1:56 PM 10/12/22

## 2022-10-12 NOTE — Progress Notes (Signed)
   Patient Name: Randy Jacobson Date of Encounter: 10/12/2022 [facility] Cardiologist: None   Interval Summary  .    77 y.o. 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  No past cardiac hx. + hx episodic dizziness.   Today no chest pain or SOB.  He would like to go home prior to surgery.   Dr. Edilia Bo is agreeable to this.  For nuc study and echo today.   Vital Signs .    Vitals:   10/11/22 2226 10/11/22 2312 10/12/22 0342 10/12/22 0442  BP: (!) 160/75 (!) 150/66 (!) 169/77 (!) 164/60  Pulse: 82 79 80 78  Resp: 20 15 15 15   Temp:  97.7 F (36.5 C) 97.8 F (36.6 C)   TempSrc:  Oral Oral   SpO2: 96% 95% 98%   Weight:      Height:        Intake/Output Summary (Last 24 hours) at 10/12/2022 0651 Last data filed at 10/11/2022 1955 Gross per 24 hour  Intake --  Output 400 ml  Net -400 ml      10/11/2022    8:00 AM 09/30/2022    1:18 PM 06/08/2019    9:04 AM  Last 3 Weights  Weight (lbs) 186 lb 187 lb 165 lb  Weight (kg) 84.369 kg 84.823 kg 74.844 kg      Telemetry/ECG    SR  - Personally Reviewed  Echo and stress test pending  Physical Exam .   GEN: No acute distress.   Neck: No JVD Cardiac: RRR, no murmurs, rubs, or gallops.  Respiratory: Fairly Clear to auscultation bilaterally. GI: Soft, nontender, non-distended  MS: No edema, Lt great toe  Assessment & Plan .     Near syncope-likely vagally mediated Sinus Brady with sinus pause <3 sec --dizzy spell after femoral sheath removal with manual pressure  --SB with HR to 20-30 BPM  and sinus pause < 3 sec --several episodes of dizziness that are brief. --Echo pending --K+ 4.1 Mg+ 1.8  TSH 0.362 --hs troponin 11 and 22 --LDL 49  HDL 36   Pre-op risk eval for left  fem to below knee popliteal artery bypass 10/15/22 PAD --nuc study today  --consider carotid dopplers  DM-2 per primary team --A1c 6.5    For questions or updates, please contact [facility] Please consult [email or web address] for contact info under        Signed, Nada Boozer, NP

## 2022-10-12 NOTE — Progress Notes (Addendum)
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  He is scheduled for a left femoral to below-knee popliteal artery bypass graft on Friday.  He had bradycardia prior to discharge yesterday and therefore was kept for cardiac workup.  CARDIAC: I appreciate Dr. Marylene Buerger help.  He is scheduled for an echo and a nuclear medicine stress test today.  Of note, the patient desperately wants to go home today if possible.  He has animals that need to be fed with no one who can really do this for him.  In addition his girlfriend who is in her 82s and has some dementia has messed up his alarm system and he is very concerned about this.  From my standpoint he can go home after his cardiac workup and we can bring him back Friday for surgery.  If he is not cleared for surgery perhaps further workup can be done as an outpatient as the wound currently is not infected and is fairly stable.  Cari Caraway, MD 7:57 AM   10/12/2022 7:29 AM 1 Day Post-Op  Subjective:  has some pain in the left great toe. Would like to leave the hospital when he can and then come back on Friday for surgery if possible  Vitals:   10/12/22 0342 10/12/22 0442  BP: (!) 169/77 (!) 164/60  Pulse: 80 78  Resp: 15 15  Temp: 97.8 F (36.6 C)   SpO2: 98%    Physical Exam: General:  resting comfortably Cardiac:  regular Lungs:  nonlabored Incisions:  right groin cath site intact and soft without hematoma Extremities:  palpable left femoral pulse. Unchanged left GT ulceration   CBC    Component Value Date/Time   WBC 22.2 (HH) 05/21/2019 1205   WBC 10.6 (H) 02/01/2019 1702   RBC 4.02 (L) 05/21/2019 1205   RBC 3.48 (L) 02/01/2019 1702   HGB 11.6 (L) 10/11/2022 0740   HGB 9.4 (L) 05/21/2019 1205   HCT 34.0 (L) 10/11/2022 0740   HCT 29.3 (L) 05/21/2019 1205   PLT 245  05/21/2019 1205   MCV 73 (L) 05/21/2019 1205   MCH 23.4 (L) 05/21/2019 1205   MCH 32.2 02/01/2019 1702   MCHC 32.1 05/21/2019 1205   MCHC 34.5 02/01/2019 1702   RDW 15.7 (H) 05/21/2019 1205   LYMPHSABS 0.5 (L) 05/21/2019 1205   MONOABS 0.5 04/08/2017 1714   EOSABS 0.0 05/21/2019 1205   BASOSABS 0.1 05/21/2019 1205    BMET    Component Value Date/Time   NA 137 10/11/2022 1626   NA 132 (L) 05/21/2019 1205   K 4.1 10/11/2022 1626   CL 106 10/11/2022 1626   CO2 25 10/11/2022 1626   GLUCOSE 265 (H) 10/11/2022 1626   BUN 16 10/11/2022 1626   BUN 18 05/21/2019 1205   CREATININE 0.80 10/11/2022 1626   CALCIUM 8.3 (L) 10/11/2022 1626   GFRNONAA >60 10/11/2022 1626   GFRAA 94 05/21/2019 1205    INR    Component Value Date/Time   INR 1.10 12/27/2014 0730     Intake/Output Summary (Last 24 hours) at 10/12/2022 0729 Last data filed at 10/11/2022 1955 Gross per 24 hour  Intake --  Output 400 ml  Net -400 ml   Assessment/Plan:  77 y.o. male is 1 day post op, s/p: angioplasty and stenting of the left common iliac and external iliac arteries  -  Feeling fine this morning, only with some pain in the left great toe. His ulceration is unchanged -Underwent angio yesterday. R groin cath site is soft without hematoma -He has a palpable left femoral pulse -Unfortunately had an episode of bradycardia and hypotension with sheath extraction yesterday. He will require cardiac workup prior to surgery on Friday. -Current plans from cardio include stress test today. Will also need echo -Tentatively planning for L fem-pop bypass on Friday pending cardiology results   Loel Dubonnet, PA-C [facility] [phone] 10/12/2022 7:29 AM

## 2022-10-12 NOTE — Progress Notes (Signed)
Mobility Specialist Progress Note:   10/12/22 1600  Mobility  Activity Ambulated independently in hallway  Level of Assistance Independent  Assistive Device None  Distance Ambulated (ft) 500 ft  Activity Response Tolerated well  Mobility Referral Yes  $Mobility charge 1 Mobility  Mobility Specialist Start Time (ACUTE ONLY) 1535  Mobility Specialist Stop Time (ACUTE ONLY) 1550  Mobility Specialist Time Calculation (min) (ACUTE ONLY) 15 min    Pt received in bed, agreeable to mobility. Independent w/ supervision for bed mobility and ambulation. No c/o throughout. Pt left on EOB with call bell and family present.  D'Vante Earlene Plater Mobility Specialist Please contact via Special educational needs teacher or Rehab office at [phone]

## 2022-10-12 NOTE — Progress Notes (Signed)
   Lexiscan myoview completed without complications.  Results are to follow!   Nada Boozer, FNP-C [facility]  [phone] or after 5pm and on weekends call [phone] 10/12/2022.now

## 2022-10-12 NOTE — Progress Notes (Signed)
PHARMACIST LIPID MONITORING   Randy Jacobson is a 77 y.o. male admitted on 10/11/2022 with nonhealing wound of his left great toe and evidence of infrainguinal arterial occlusive disease s/p aortogram and LLE angioplasty and stenting.  Pharmacy has been consulted to optimize lipid-lowering therapy with the indication of secondary prevention for clinical ASCVD.  Recent Labs:  Lipid Panel (last 6 months):   Lab Results  Component Value Date   CHOL 109 10/12/2022   TRIG 121 10/12/2022   HDL 36 (L) 10/12/2022   CHOLHDL 3.0 10/12/2022   VLDL 24 10/12/2022   LDLCALC 49 10/12/2022    Hepatic function panel (last 6 months):   No results found for: "AST", "ALT", "ALKPHOS", "BILITOT", "BILIDIR", "IBILI"  SCr (since admission):   Serum creatinine: 0.8 mg/dL 16/10/96 0454 Estimated creatinine clearance: 81.1 mL/min  Current therapy and lipid therapy tolerance Current lipid-lowering therapy: atorvastatin 40 Previous lipid-lowering therapies (if applicable): n/a Documented or reported allergies or intolerances to lipid-lowering therapies (if applicable): none  Assessment:   No changes indicated   Plan:    1.Statin intensity (high intensity recommended for all patients regardless of the LDL):  No statin changes. The patient is already on a high intensity statin.  2.Add ezetimibe (if any one of the following):   Not indicated at this time.  3.Refer to lipid clinic:   No  4.Follow-up with:  Primary care provider - Benita Stabile, MD  5.Follow-up labs after discharge:  No changes in lipid therapy, repeat a lipid panel in one year.     Alphia Moh, PharmD, BCPS, BCCP Clinical Pharmacist  Please check AMION for all [facility] phone numbers After 10:00 PM, call Main Pharmacy [phone]

## 2022-10-12 NOTE — Discharge Instructions (Signed)
° °  [facility] [place] ° °Discharge Instructions ° °Lower Extremity Angiogram; Angioplasty/Stenting ° °Please refer to the following instructions for your post-procedure care. Your surgeon or physician assistant will discuss any changes with you. ° °Activity ° °Avoid lifting more than 8 pounds (1 gallons of milk) for 72 hours (3 days) after your procedure. You may walk as much as you can tolerate. It's OK to drive after 72 hours. ° °Bathing/Showering ° °You may shower the day after your procedure. If you have a bandage, you may remove it at 24- 48 hours. Clean your incision site with mild soap and water. Pat the area dry with a clean towel. ° °Diet ° °Resume your pre-procedure diet. There are no special food restrictions following this procedure. All patients with peripheral vascular disease should follow a low fat/low cholesterol diet. In order to heal from your surgery, it is CRITICAL to get adequate nutrition. Your body requires vitamins, minerals, and protein. Vegetables are the best source of vitamins and minerals. Vegetables also provide the perfect balance of protein. Processed food has little nutritional value, so try to avoid this. ° °Medications ° °Resume taking all of your medications unless your doctor tells you not to. If your incision is causing pain, you may take over-the-counter pain relievers such as acetaminophen (Tylenol) ° °Follow Up ° °Follow up will be arranged at the time of your procedure. You may have an office visit scheduled or may be scheduled for surgery. Ask your surgeon if you have any questions. ° °Please call us immediately for any of the following conditions: °•Severe or worsening pain your legs or feet at rest or with walking. °•Increased pain, redness, drainage at your groin puncture site. °•Fever of 101 degrees or higher. °•If you have any mild or slow bleeding from your puncture site: lie down, apply firm constant pressure over the area with a piece of  gauze or a clean wash cloth for 30 minutes- no peeking!, call 911 right away if you are still bleeding after 30 minutes, or if the bleeding is heavy and unmanageable. ° °Reduce your risk factors of vascular disease: ° °Stop smoking. If you would like help call [facility] at 1-800-QUIT-NOW (1-800-784-8669) or [place] at [phone]. °Manage your cholesterol °Maintain a desired weight °Control your diabetes °Keep your blood pressure down ° °If you have any questions, please call the office at [phone] ° °

## 2022-10-12 NOTE — H&P (View-Only) (Signed)
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  He is scheduled for a left femoral to below-knee popliteal artery bypass graft on Friday.  He had bradycardia prior to discharge yesterday and therefore was kept for cardiac workup.  CARDIAC: I appreciate Dr. O'Neal's help.  He is scheduled for an echo and a nuclear medicine stress test today.  Of note, the patient desperately wants to go home today if possible.  He has animals that need to be fed with no one who can really do this for him.  In addition his girlfriend who is in her 80s and has some dementia has messed up his alarm system and he is very concerned about this.  From my standpoint he can go home after his cardiac workup and we can bring him back Friday for surgery.  If he is not cleared for surgery perhaps further workup can be done as an outpatient as the wound currently is not infected and is fairly stable.  Chris Millissa Deese, MD 7:57 AM   10/12/2022 7:29 AM 1 Day Post-Op  Subjective:  has some pain in the left great toe. Would like to leave the hospital when he can and then come back on Friday for surgery if possible  Vitals:   10/12/22 0342 10/12/22 0442  BP: (!) 169/77 (!) 164/60  Pulse: 80 78  Resp: 15 15  Temp: 97.8 F (36.6 C)   SpO2: 98%    Physical Exam: General:  resting comfortably Cardiac:  regular Lungs:  nonlabored Incisions:  right groin cath site intact and soft without hematoma Extremities:  palpable left femoral pulse. Unchanged left GT ulceration   CBC    Component Value Date/Time   WBC 22.2 (HH) 05/21/2019 1205   WBC 10.6 (H) 02/01/2019 1702   RBC 4.02 (L) 05/21/2019 1205   RBC 3.48 (L) 02/01/2019 1702   HGB 11.6 (L) 10/11/2022 0740   HGB 9.4 (L) 05/21/2019 1205   HCT 34.0 (L) 10/11/2022 0740   HCT 29.3 (L) 05/21/2019 1205   PLT 245  05/21/2019 1205   MCV 73 (L) 05/21/2019 1205   MCH 23.4 (L) 05/21/2019 1205   MCH 32.2 02/01/2019 1702   MCHC 32.1 05/21/2019 1205   MCHC 34.5 02/01/2019 1702   RDW 15.7 (H) 05/21/2019 1205   LYMPHSABS 0.5 (L) 05/21/2019 1205   MONOABS 0.5 04/08/2017 1714   EOSABS 0.0 05/21/2019 1205   BASOSABS 0.1 05/21/2019 1205    BMET    Component Value Date/Time   NA 137 10/11/2022 1626   NA 132 (L) 05/21/2019 1205   K 4.1 10/11/2022 1626   CL 106 10/11/2022 1626   CO2 25 10/11/2022 1626   GLUCOSE 265 (H) 10/11/2022 1626   BUN 16 10/11/2022 1626   BUN 18 05/21/2019 1205   CREATININE 0.80 10/11/2022 1626   CALCIUM 8.3 (L) 10/11/2022 1626   GFRNONAA >60 10/11/2022 1626   GFRAA 94 05/21/2019 1205    INR    Component Value Date/Time   INR 1.10 12/27/2014 0730     Intake/Output Summary (Last 24 hours) at 10/12/2022 0729 Last data filed at 10/11/2022 1955 Gross per 24 hour  Intake --  Output 400 ml  Net -400 ml   Assessment/Plan:  77 y.o. male is 1 day post op, s/p: angioplasty and stenting of the left common iliac and external iliac arteries  -  Feeling fine this morning, only with some pain in the left great toe. His ulceration is unchanged -Underwent angio yesterday. R groin cath site is soft without hematoma -He has a palpable left femoral pulse -Unfortunately had an episode of bradycardia and hypotension with sheath extraction yesterday. He will require cardiac workup prior to surgery on Friday. -Current plans from cardio include stress test today. Will also need echo -Tentatively planning for L fem-pop bypass on Friday pending cardiology results   McKenzi Schuh, PA-C [facility] [phone] 10/12/2022 7:29 AM    

## 2022-10-13 ENCOUNTER — Encounter (HOSPITAL_COMMUNITY): Payer: Self-pay | Admitting: Vascular Surgery

## 2022-10-13 ENCOUNTER — Other Ambulatory Visit: Payer: Self-pay

## 2022-10-13 ENCOUNTER — Telehealth: Payer: Self-pay

## 2022-10-13 DIAGNOSIS — Z794 Long term (current) use of insulin: Secondary | ICD-10-CM | POA: Diagnosis not present

## 2022-10-13 DIAGNOSIS — I70262 Atherosclerosis of native arteries of extremities with gangrene, left leg: Secondary | ICD-10-CM

## 2022-10-13 DIAGNOSIS — E119 Type 2 diabetes mellitus without complications: Secondary | ICD-10-CM | POA: Diagnosis not present

## 2022-10-13 NOTE — Progress Notes (Signed)
Patient called to review again the pills he is supposed to take the morning of surgery. I told him he should take atorvastatin (LIPITOR), clopidogrel (PLAVIX) ,and pantoprazole (PROTONIX).  He repeated them back to me. He also stated that he understands not to take his Metformin the day of surgery. He said he knows he will take 1/2 his usual dose of Tresiba -18 units- the morning of surgery. He also stated that he has very rarely used his Albuterol inhaler and he has left it at his house in [place]. I told him he did not need to bring it with him.

## 2022-10-13 NOTE — Progress Notes (Signed)
Anesthesia Chart Review: Maury Dus  Case: [id number] Date/Time: 10/15/22 0715   Procedure: LEFT FEMORAL-BELOW KNEE POPLITEAL ARTERY BYPASS WITH VEIN (Left)   Anesthesia type: General   Pre-op diagnosis: Peripheral artery disease with gangrene of left foot   [facility] ROOM [facility] / [facility]   Surgeons: Chuck Hint, MD       DISCUSSION: Patient is a 77 year old male scheduled for the above procedure.  History includes former smoker (quit 11/20/93), HTN, DM2, PAD (bilateral CIA stents 12/27/14; left CIA & left EIA stenting 10/11/22), right BBB, bradycardia (see below).   [facility] admission 10/11/22 - 10/12/22 for PAD, s/p left CIA and left EIA stenting with plans for left FPBG. Post-procedure he developed presyncope with HR in the 40's and SBP 76 mmHg after sheath removed. BP improved with IVF but he required atropine for HR dropping to the 20-30's with BP 140/58 and HR 70's. He reported intermittent similar episodes of lightheadedness over the past 3 months that resolved spontaneously. He reported otherwise being active, able to walk up stairs and do house chores or light housework. He has know baseline RBBB. Currently some limitation due to left foot wound. HS Troponin 11-22. Cardiology (Dr. Lennie Odor) was consulted. Telemetry reviewed and notes sinus pause up to 3.8 seconds. Event was thought to be vasovagal. Admission recommended so he could undergo echocardiogram and stress testing prior to surgery, and if results reassuring could undergone surgery as planned.    10/12/22 stress test was non-ischemic, EF 59%. 10/12/22 echo showed LVEF 55-60%, mild LVH, grade 1 DD, normal RVSF, trivial MR.  He is for labs per vascular surgery on the day of surgery. He is to continue Plavix per Dr. Edilia Bo.   VS:  BP Readings from Last 3 Encounters:  10/12/22 (!) 192/88  09/30/22 (!) 146/81  11/16/21 123/79   Pulse Readings from Last 3 Encounters:  10/12/22 (!) 101  09/30/22 85  11/16/21 96      PROVIDERS: Benita Stabile, MD is PCP    LABS: For day of surgery as indicated. Last results in [facility] include: Lab Results  Component Value Date   HGB 11.6 (L) 10/11/2022   HCT 34.0 (L) 10/11/2022   PLT 245 05/21/2019   GLUCOSE 265 (H) 10/11/2022   CHOL 109 10/12/2022   TRIG 121 10/12/2022   HDL 36 (L) 10/12/2022   LDLCALC 49 10/12/2022   NA 137 10/11/2022   K 4.1 10/11/2022   CL 106 10/11/2022   CREATININE 0.80 10/11/2022   BUN 16 10/11/2022   CO2 25 10/11/2022   TSH 0.362 10/11/2022   HGBA1C 6.5 (H) 10/11/2022    IMAGES: CXR 11/16/21: FINDINGS: The heart size and mediastinal contours are within normal limits. Both lungs are clear. The visualized skeletal structures are unremarkable.   IMPRESSION: No active cardiopulmonary disease. Aortic Atherosclerosis (ICD10-I70.0).      EKG: 10/11/22: Normal sinus rhythm Right bundle branch block Abnormal ECG Confirmed by Lennie Odor [phone]) on 10/11/2022 5:11:19 PM - Known RBBB since at least 12/16/15.   CV: Echo 10/12/22: IMPRESSIONS   1. Left ventricular ejection fraction, by estimation, is 55 to 60%. The  left ventricle has normal function. Left ventricular endocardial border  not optimally defined to evaluate regional wall motion. There is mild left  ventricular hypertrophy. Left  ventricular diastolic parameters are consistent with Grade I diastolic  dysfunction (impaired relaxation).   2. Right ventricular systolic function is normal. The right ventricular  size is normal.  3. The mitral valve is abnormal. Trivial mitral valve regurgitation.   4. The aortic valve is tricuspid. There is mild calcification of the  aortic valve. There is mild thickening of the aortic valve. Aortic valve  regurgitation is not visualized. Aortic valve sclerosis is present, with  no evidence of aortic valve stenosis.   Nuclear stress test 10/12/22: IMPRESSION: 1. No reversible ischemia or infarction. 2. Normal left ventricular wall  motion. 3. Left ventricular ejection fraction 59% 4. Non invasive risk stratification*: Low *2012 Appropriate Use Criteria for Coronary Revascularization Focused Update: J Am Coll Cardiol. 2012;59(9):857-881. http://content.dementiazones.com.aspx?articleid=1201161   Past Medical History:  Diagnosis Date   Diabetes mellitus without complication (HCC)    Dysrhythmia    chronic RBBB; bradycardia s/p atropine following sheath removal, 3.8 second sinus pause 10/11/22   Hypertension    Peripheral artery disease [facility])     Past Surgical History:  Procedure Laterality Date   "stents in both my legs"     ABDOMINAL AORTOGRAM W/LOWER EXTREMITY N/A 10/11/2022   Procedure: ABDOMINAL AORTOGRAM W/LOWER EXTREMITY;  Surgeon: Chuck Hint, MD;  Location: [facility] INVASIVE CV LAB;  Service: Cardiovascular;  Laterality: N/A;   GIVENS CAPSULE STUDY N/A 04/10/2017   Procedure: GIVENS CAPSULE STUDY;  Surgeon: Charlott Rakes, MD;  Location[facility] ENDOSCOPY;  Service: Endoscopy;  Laterality: N/A;   IR GENERIC HISTORICAL  08/26/2015   IR RADIOLOGIST EVAL & MGMT 08/26/2015 Malachy Moan, MD [facility] INTERV RAD   IR GENERIC HISTORICAL  04/21/2016   IR RADIOLOGIST EVAL & MGMT 04/21/2016 Malachy Moan, MD [facility] INTERV RAD   PERIPHERAL VASCULAR INTERVENTION  10/11/2022   Procedure: PERIPHERAL VASCULAR INTERVENTION;  Surgeon: Chuck Hint, MD;  Location: [facility] INVASIVE CV LAB;  Service: Cardiovascular;;    MEDICATIONS: No current facility-administered medications for this encounter.    albuterol (VENTOLIN HFA) 108 (90 Base) MCG/ACT inhaler   atorvastatin (LIPITOR) 40 MG tablet   budesonide-formoterol (SYMBICORT) 160-4.5 MCG/ACT inhaler   celecoxib (CELEBREX) 200 MG capsule   cephALEXin (KEFLEX) 500 MG capsule   clopidogrel (PLAVIX) 75 MG tablet   Emollient (EUCERIN) lotion   gabapentin (NEURONTIN) 300 MG capsule   hydrochlorothiazide (HYDRODIURIL) 25 MG tablet   losartan (COZAAR) 50 MG tablet    Melatonin 10 MG TABS   metFORMIN (GLUCOPHAGE) 1000 MG tablet   mineral oil-hydrophilic petrolatum (AQUAPHOR) ointment   Multiple Vitamin (MULTIVITAMIN WITH MINERALS) TABS tablet   pantoprazole (PROTONIX) 40 MG tablet   TRESIBA FLEXTOUCH 100 UNIT/ML FlexTouch Pen    Shonna Chock, PA-C Surgical Short Stay/Anesthesiology [facility] Phone [phone] [facility] Phone [phone] 10/13/2022 3:17 PM

## 2022-10-13 NOTE — Telephone Encounter (Signed)
Left VM for patient to return call, so can provide information for upcoming surgery on 7/5.

## 2022-10-13 NOTE — Telephone Encounter (Signed)
Spoke with patient and he agreed to schedule surgery on 7/5. Instructions provided including NPO after MN, continue Plavix per Dr. Edilia Bo, arrive to [facility] admitting at 0530 AM. He voiced understanding to all info provided.

## 2022-10-13 NOTE — Progress Notes (Signed)
SDW CALL  Patient was given pre-op instructions over the phone. The opportunity was given for the patient to ask questions. No further questions asked. Patient verbalized understanding of instructions given.   PCP - Kathleene Hazel. Hall,MD Cardiologist - none  PPM/ICD - denies Device Orders -  Rep Notified -   Chest x-ray - 11/16/21 EKG - 10/11/22 Stress Test - 10/12/22 ECHO - 10/12/22 Cardiac Cath - none  Sleep Study - none CPAP - no  Fasting Blood Sugar - 70-75 Checks Blood Sugar one times a day  Blood Thinner Instructions:continue Plavix per Dr, Adele Dan instructions Aspirin Instructions:na  ERAS Protcol -no PRE-SURGERY Ensure or G2-   COVID TEST- na   Anesthesia review: yes- recent admission for near syncope after sheath removal for arteriography 10/11/22. History HTN,DM,PAD. No cardiologist.  Patient denies shortness of breath, fever, cough and chest pain over the phone call    Surgical Instructions    Your procedure is scheduled on Friday July 5.  Report to [facility] Main Entrance "A" at 0530 A.M., then check in with the Admitting office.  Call this number if you have problems the morning of surgery:  [phone]    Remember:  Do not eat or drink anything after midnight the night before your surgery   Take these medicines the morning of surgery with A SIP OF WATER: Plavix,Lipitor,Keflex,Protonix  As of today, STOP taking any Aspirin (unless otherwise instructed by your surgeon) Aleve, Naproxen, Ibuprofen, Motrin, Advil, Goody's, BC's, all herbal medications, fish oil, and all vitamins. Hold Celebrex as of today.   WHAT DO I DO ABOUT MY DIABETES MEDICATION?  DO NOT TAKE METFORMIN(GLUCOPHAGE) THE MORNING OF SURGERY   THE MORNING OF SURGERY, take 19 units of Tresiba insulin.  HOW TO MANAGE YOUR DIABETES BEFORE AND AFTER SURGERY  How do I manage my blood sugar before surgery? Check your blood sugar at least 4 times a day, starting 2 days before surgery, to make  sure that the level is not too high or low.  Check your blood sugar the morning of your surgery when you wake up and every 2 hours until you get to the Short Stay unit.  If your blood sugar is less than 70 mg/dL, you will need to treat for low blood sugar: Do not take insulin. Treat a low blood sugar (less than 70 mg/dL) with  cup of clear juice (cranberry or apple), 4 glucose tablets, OR glucose gel. Recheck blood sugar in 15 minutes after treatment (to make sure it is greater than 70 mg/dL). If your blood sugar is not greater than 70 mg/dL on recheck, call [phone] for further instructions. Report your blood sugar to the short stay nurse when you get to Short Stay.  If you are admitted to the hospital after surgery: Your blood sugar will be checked by the staff and you will probably be given insulin after surgery (instead of oral diabetes medicines) to make sure you have good blood sugar levels. The goal for blood sugar control after surgery is 80-180 mg/dL.  [facility] is not responsible for any belongings or valuables. .   Do NOT Smoke (Tobacco/Vaping)  24 hours prior to your procedure  If you use a CPAP at night, you may bring your mask for your overnight stay.   Contacts, glasses, hearing aids, dentures or partials may not be worn into surgery, please bring cases for these belongings  Special instructions:    Oral Hygiene is also important to reduce your risk of  infection.  Remember - BRUSH YOUR TEETH THE MORNING OF SURGERY WITH YOUR REGULAR TOOTHPASTE   Day of Surgery:  Take a shower the day of or night before with antibacterial soap. Wear Clean/Comfortable clothing the morning of surgery Do not apply any deodorants/lotions.   Do not wear jewelry or makeup Do not wear lotions, powders, perfumes/colognes, or deodorant. Do not shave 48 hours prior to surgery.  Men may shave face and neck. Do not bring valuables to the hospital. Do not wear nail polish, gel polish,  artificial nails, or any other type of covering on natural nails (fingers and toes) If you have artificial nails or gel coating that need to be removed by a nail salon, please have this removed prior to surgery. Artificial nails or gel coating may interfere with anesthesia's ability to adequately monitor your vital signs. Remember to brush your teeth WITH YOUR REGULAR TOOTHPASTE.

## 2022-10-13 NOTE — Discharge Summary (Signed)
Discharge Summary    EULIS AHLQUIST June 01, 1945 77 y.o. male  [id number]  Admission Date: 10/11/2022  Discharge Date: 10/12/2022  Physician: Edilia Bo  Admission Diagnosis: PAD (peripheral artery disease) (HCC) [I73.9]   HPI:   This is a 77 y.o. male  who presents with a nonhealing wound of his left great toe and evidence of infrainguinal arterial occlusive disease.  He presents for arteriography and possible intervention   Hospital Course:  The patient was admitted to the hospital and taken to the [facility] lab on 10/11/2022 and underwent: Conscious sedation Ultrasound-guided access to the right common femoral artery Aortogram with bilateral iliac arteriogram Selective catheterization of the left external iliac artery second-order catheterization with left lower extremity runoff Angioplasty and stenting of the left common iliac artery and external iliac artery    Findings as follows: The infrarenal aorta is widely patent.  Both common iliac artery stents are patent.  The hypogastric arteries bilaterally are patent.  The right external iliac artery is patent. On the left side, which is the site of concern there was an 80% left external iliac artery stenosis and some moderate disease in the distal common iliac artery which were addressed with angioplasty and stenting as described above.  Below that there was some moderate plaque in the common femoral artery.  The deep femoral artery was patent.  The superficial femoral artery was occluded at its origin.  There is reconstitution of the above-knee popliteal artery but there is an eccentric plaque just above the level of the knee.  The below-knee popliteal segment is patent.  The anterior tibial is patent.  The tibial peroneal trunk, posterior tibial, and peroneal arteries are patent.  There is moderate disease in the dorsalis pedis artery.   CLINICAL NOTE: He is undergone successful angioplasty and stenting of the external iliac artery stenosis on  the left.  He has a flush occlusion of the superficial femoral artery with disease in the common femoral artery.  Therefore I think the best option for revascularization on the left is a left femoral to below-knee popliteal artery bypass.  Will get his vein mapped while he is here.  Will try to schedule his surgery for Friday.  I have discussed the indications for the procedure and the potential complications with the patient and he is agreeable to proceed.  All of his questions were answered.  The pt tolerated the procedure well and was transported to the PACU in good condition.   That afternoon, the patient had an episode of bradycardia and hypotension when his sheath was pulled. He responded quickly to atropine. On further questioning the patient admits to several episodes of dizziness including one 3 days ago. For this reason I have asked cardiology to see. We may potentially have to admit the patient pending their recommendations. In addition we may have to hold off on surgery Friday again pending their recommendations. I will update his significant other.   Pt's stress test was negative.    Echo results:  1. Left ventricular ejection fraction, by estimation, is 55 to 60%. The  left ventricle has normal function. Left ventricular endocardial border  not optimally defined to evaluate regional wall motion. There is mild left  ventricular hypertrophy. Left  ventricular diastolic parameters are consistent with Grade I diastolic  dysfunction (impaired relaxation).   2. Right ventricular systolic function is normal. The right ventricular  size is normal.   3. The mitral valve is abnormal. Trivial mitral valve regurgitation.   4. The  aortic valve is tricuspid. There is mild calcification of the  aortic valve. There is mild thickening of the aortic valve. Aortic valve  regurgitation is not visualized. Aortic valve sclerosis is present, with  no evidence of aortic valve stenosis.   The pt is  discharged home with plans to return on 10/15/2022 for surgical revascularization.   CBC    Component Value Date/Time   WBC 22.2 (HH) 05/21/2019 1205   WBC 10.6 (H) 02/01/2019 1702   RBC 4.02 (L) 05/21/2019 1205   RBC 3.48 (L) 02/01/2019 1702   HGB 11.6 (L) 10/11/2022 0740   HGB 9.4 (L) 05/21/2019 1205   HCT 34.0 (L) 10/11/2022 0740   HCT 29.3 (L) 05/21/2019 1205   PLT 245 05/21/2019 1205   MCV 73 (L) 05/21/2019 1205   MCH 23.4 (L) 05/21/2019 1205   MCH 32.2 02/01/2019 1702   MCHC 32.1 05/21/2019 1205   MCHC 34.5 02/01/2019 1702   RDW 15.7 (H) 05/21/2019 1205   LYMPHSABS 0.5 (L) 05/21/2019 1205   MONOABS 0.5 04/08/2017 1714   EOSABS 0.0 05/21/2019 1205   BASOSABS 0.1 05/21/2019 1205    BMET    Component Value Date/Time   NA 137 10/11/2022 1626   NA 132 (L) 05/21/2019 1205   K 4.1 10/11/2022 1626   CL 106 10/11/2022 1626   CO2 25 10/11/2022 1626   GLUCOSE 265 (H) 10/11/2022 1626   BUN 16 10/11/2022 1626   BUN 18 05/21/2019 1205   CREATININE 0.80 10/11/2022 1626   CALCIUM 8.3 (L) 10/11/2022 1626   GFRNONAA >60 10/11/2022 1626   GFRAA 94 05/21/2019 1205      Discharge Instructions     Discharge patient   Complete by: As directed    Discharge disposition: 01-Home or Self Care   Discharge patient date: 10/12/2022       Discharge Diagnosis:  PAD (peripheral artery disease) (HCC) [I73.9]  Secondary Diagnosis: Patient Active Problem List   Diagnosis Date Noted   Gastroduodenitis 09/01/2021   History of adenomatous polyp of colon 09/01/2021   History of anemia 09/01/2021   Iron deficiency anemia due to chronic blood loss 09/01/2021   Cough 01/13/2021   History of peptic ulcer 09/04/2020   Insomnia 09/04/2020   Iron deficiency 09/04/2020   Proteinuria 09/04/2020   White blood cell abnormality 09/04/2020   Hyperglycemia due to type 2 diabetes mellitus (HCC) 09/01/2020   GI bleeding 04/08/2017   Diabetes mellitus, type 2 (HCC) 12/16/2015   Sepsis (HCC)  12/16/2015   Hyperlipidemia 12/16/2015   Hypertension 12/16/2015   Diarrhea    Febrile illness    Enteric campylobacteriosis    Claudication of right lower extremity (HCC)    PAD (peripheral artery disease) (HCC)    Past Medical History:  Diagnosis Date   Diabetes mellitus without complication (HCC)    Hypertension      Allergies as of 10/12/2022   No Known Allergies      Medication List     TAKE these medications    albuterol 108 (90 Base) MCG/ACT inhaler Commonly known as: VENTOLIN HFA Inhale 1-2 puffs into the lungs every 6 (six) hours as needed for shortness of breath or wheezing.   atorvastatin 40 MG tablet Commonly known as: LIPITOR Take 40 mg by mouth daily.   budesonide-formoterol 160-4.5 MCG/ACT inhaler Commonly known as: Symbicort Inhale 2 puffs into the lungs 2 (two) times daily. Rinse mouth with water after each use   celecoxib 200 MG capsule Commonly known as:  CELEBREX Take 200 mg by mouth daily.   cephALEXin 500 MG capsule Commonly known as: KEFLEX Take 1 capsule (500 mg total) by mouth 3 (three) times daily.   clopidogrel 75 MG tablet Commonly known as: PLAVIX Take 75 mg by mouth daily.   eucerin lotion Apply 1 Application topically as needed for dry skin.   mineral oil-hydrophilic petrolatum ointment Apply 1 Application topically at bedtime.   gabapentin 300 MG capsule Commonly known as: NEURONTIN Take 300 mg by mouth at bedtime.   hydrochlorothiazide 25 MG tablet Commonly known as: HYDRODIURIL Take 25 mg by mouth daily.   losartan 50 MG tablet Commonly known as: COZAAR Take 50 mg by mouth in the morning and at bedtime.   Melatonin 10 MG Tabs Take 10 mg by mouth at bedtime.   metFORMIN 1000 MG tablet Commonly known as: GLUCOPHAGE Take 1,000 mg by mouth 2 (two) times daily with a meal.   multivitamin with minerals Tabs tablet Take 1 tablet by mouth daily.   pantoprazole 40 MG tablet Commonly known as: PROTONIX Take 40 mg by  mouth daily.   Evaristo Bury FlexTouch 100 UNIT/ML FlexTouch Pen Generic drug: insulin degludec Inject 38 Units into the skin daily.          Instructions:  [facility]  Discharge Instructions  Lower Extremity Angiogram; Angioplasty/Stenting  Please refer to the following instructions for your post-procedure care. Your surgeon or physician assistant will discuss any changes with you.  Activity  Avoid lifting more than 8 pounds (1 gallons of milk) for 72 hours (3 days) after your procedure. You may walk as much as you can tolerate. It's OK to drive after 72 hours.  Bathing/Showering  You may shower the day after your procedure. If you have a bandage, you may remove it at 24- 48 hours. Clean your incision site with mild soap and water. Pat the area dry with a clean towel.  Diet  Resume your pre-procedure diet. There are no special food restrictions following this procedure. All patients with peripheral vascular disease should follow a low fat/low cholesterol diet. In order to heal from your surgery, it is CRITICAL to get adequate nutrition. Your body requires vitamins, minerals, and protein. Vegetables are the best source of vitamins and minerals. Vegetables also provide the perfect balance of protein. Processed food has little nutritional value, so try to avoid this.  Medications  Resume taking all of your medications unless your doctor tells you not to. If your incision is causing pain, you may take over-the-counter pain relievers such as acetaminophen (Tylenol)  Follow Up  Follow up will be arranged at the time of your procedure. You may have an office visit scheduled or may be scheduled for surgery. Ask your surgeon if you have any questions.  Please call us immediately for any of the following conditions: Severe or worsening pain your legs or feet at rest or with walking. Increased pain, redness, drainage at your groin puncture site. Fever of  101 degrees or higher. If you have any mild or slow bleeding from your puncture site: lie down, apply firm constant pressure over the area with a piece of gauze or a clean wash cloth for 30 minutes- no peeking!, call 911 right away if you are still bleeding after 30 minutes, or if the bleeding is heavy and unmanageable.  Reduce your risk factors of vascular disease:  Stop smoking. If you would like help call [facility] at 1-800-QUIT-NOW (845 748 6836) or [place] at  [phone]. Manage your cholesterol Maintain a desired weight Control your diabetes Keep your blood pressure down  If you have any questions, please call the office at [phone]  Prescriptions given: none  Disposition: home  Patient's condition: is Good  Follow up: Surgery on 10/15/2022   Doreatha Massed, PA-C [facility] [phone] 10/13/2022  8:14 AM

## 2022-10-13 NOTE — Anesthesia Preprocedure Evaluation (Addendum)
Anesthesia Evaluation  Patient identified by MRN, date of birth, ID band Patient awake    Reviewed: Allergy & Precautions, NPO status , Patient's Chart, lab work & pertinent test results  Airway Mallampati: II  TM Distance: >3 FB Neck ROM: Full    Dental  (+) Dental Advisory Given, Edentulous Upper, Edentulous Lower   Pulmonary former smoker   breath sounds clear to auscultation       Cardiovascular hypertension, Pt. on medications + Peripheral Vascular Disease  + dysrhythmias  Rhythm:Regular Rate:Normal  Echo:   1. Left ventricular ejection fraction, by estimation, is 55 to 60%. The  left ventricle has normal function. Left ventricular endocardial border  not optimally defined to evaluate regional wall motion. There is mild left  ventricular hypertrophy. Left  ventricular diastolic parameters are consistent with Grade I diastolic  dysfunction (impaired relaxation).   2. Right ventricular systolic function is normal. The right ventricular  size is normal.   3. The mitral valve is abnormal. Trivial mitral valve regurgitation.   4. The aortic valve is tricuspid. There is mild calcification of the  aortic valve. There is mild thickening of the aortic valve. Aortic valve  regurgitation is not visualized. Aortic valve sclerosis is present, with  no evidence of aortic valve stenosis.     Neuro/Psych negative neurological ROS  negative psych ROS   GI/Hepatic Neg liver ROS,GERD  Medicated,,  Endo/Other  diabetes, Type 2, Oral Hypoglycemic Agents    Renal/GU negative Renal ROS     Musculoskeletal negative musculoskeletal ROS (+)    Abdominal   Peds  Hematology negative hematology ROS (+)   Anesthesia Other Findings   Reproductive/Obstetrics                             Anesthesia Physical Anesthesia Plan  ASA: 3  Anesthesia Plan: General   Post-op Pain Management: Tylenol PO (pre-op)*    Induction: Intravenous  PONV Risk Score and Plan: 3 and Ondansetron and Treatment may vary due to age or medical condition  Airway Management Planned: Oral ETT  Additional Equipment: Arterial line  Intra-op Plan:   Post-operative Plan: Extubation in OR  Informed Consent: I have reviewed the patients History and Physical, chart, labs and discussed the procedure including the risks, benefits and alternatives for the proposed anesthesia with the patient or authorized representative who has indicated his/her understanding and acceptance.     Dental advisory given  Plan Discussed with: CRNA  Anesthesia Plan Comments: (PAT note written 10/13/2022 by Shonna Chock, PA-C.  )       Anesthesia Quick Evaluation

## 2022-10-15 ENCOUNTER — Other Ambulatory Visit: Payer: Self-pay

## 2022-10-15 ENCOUNTER — Encounter (HOSPITAL_COMMUNITY): Payer: Self-pay | Admitting: Vascular Surgery

## 2022-10-15 ENCOUNTER — Inpatient Hospital Stay (HOSPITAL_COMMUNITY): Payer: Medicare Other | Admitting: Vascular Surgery

## 2022-10-15 ENCOUNTER — Encounter (HOSPITAL_COMMUNITY)
Admission: RE | Disposition: A | Discharge status: Home / Self Care | Payer: Self-pay | Source: Home / Self Care | Attending: Vascular Surgery

## 2022-10-15 ENCOUNTER — Inpatient Hospital Stay (HOSPITAL_COMMUNITY)
Admission: RE | Admit: 2022-10-15 | Discharge: 2022-10-18 | DRG: 271 | Disposition: A | Discharge status: Home / Self Care | Payer: Medicare Other | Attending: Vascular Surgery | Admitting: Vascular Surgery

## 2022-10-15 DIAGNOSIS — E1152 Type 2 diabetes mellitus with diabetic peripheral angiopathy with gangrene: Principal | ICD-10-CM | POA: Diagnosis present

## 2022-10-15 DIAGNOSIS — I739 Peripheral vascular disease, unspecified: Principal | ICD-10-CM | POA: Diagnosis present

## 2022-10-15 DIAGNOSIS — D62 Acute posthemorrhagic anemia: Secondary | ICD-10-CM | POA: Diagnosis not present

## 2022-10-15 DIAGNOSIS — Z7984 Long term (current) use of oral hypoglycemic drugs: Secondary | ICD-10-CM | POA: Diagnosis not present

## 2022-10-15 DIAGNOSIS — I10 Essential (primary) hypertension: Secondary | ICD-10-CM | POA: Diagnosis present

## 2022-10-15 DIAGNOSIS — I4510 Unspecified right bundle-branch block: Secondary | ICD-10-CM | POA: Diagnosis present

## 2022-10-15 DIAGNOSIS — I70262 Atherosclerosis of native arteries of extremities with gangrene, left leg: Secondary | ICD-10-CM | POA: Diagnosis present

## 2022-10-15 DIAGNOSIS — I708 Atherosclerosis of other arteries: Secondary | ICD-10-CM | POA: Diagnosis present

## 2022-10-15 DIAGNOSIS — R531 Weakness: Secondary | ICD-10-CM | POA: Diagnosis not present

## 2022-10-15 DIAGNOSIS — Z87891 Personal history of nicotine dependence: Secondary | ICD-10-CM

## 2022-10-15 DIAGNOSIS — L97529 Non-pressure chronic ulcer of other part of left foot with unspecified severity: Secondary | ICD-10-CM | POA: Diagnosis not present

## 2022-10-15 DIAGNOSIS — F0390 Unspecified dementia without behavioral disturbance: Secondary | ICD-10-CM | POA: Diagnosis present

## 2022-10-15 DIAGNOSIS — Z7902 Long term (current) use of antithrombotics/antiplatelets: Secondary | ICD-10-CM

## 2022-10-15 HISTORY — PX: FEMORAL-POPLITEAL BYPASS GRAFT: SHX937

## 2022-10-15 HISTORY — DX: Cardiac arrhythmia, unspecified: I49.9

## 2022-10-15 HISTORY — DX: Peripheral vascular disease, unspecified: I73.9

## 2022-10-15 HISTORY — PX: ENDARTERECTOMY FEMORAL: SHX5804

## 2022-10-15 HISTORY — PX: PATCH ANGIOPLASTY: SHX6230

## 2022-10-15 LAB — COMPREHENSIVE METABOLIC PANEL
ALT: 24 U/L (ref 0–44)
AST: 28 U/L (ref 15–41)
Albumin: 4.2 g/dL (ref 3.5–5.0)
Alkaline Phosphatase: 76 U/L (ref 38–126)
Anion gap: 11 (ref 5–15)
BUN: 21 mg/dL (ref 8–23)
CO2: 26 mmol/L (ref 22–32)
Calcium: 9.3 mg/dL (ref 8.9–10.3)
Chloride: 100 mmol/L (ref 98–111)
Creatinine, Ser: 1.24 mg/dL (ref 0.61–1.24)
GFR, Estimated: 60 mL/min — ABNORMAL LOW (ref >60)
Glucose, Bld: 113 mg/dL — ABNORMAL HIGH (ref 70–99)
Potassium: 3.5 mmol/L (ref 3.5–5.1)
Sodium: 137 mmol/L (ref 135–145)
Total Bilirubin: 0.7 mg/dL (ref 0.3–1.2)
Total Protein: 7.8 g/dL (ref 6.5–8.1)

## 2022-10-15 LAB — PROTIME-INR
INR: 1.1 (ref 0.8–1.2)
Prothrombin Time: 14.5 seconds (ref 11.4–15.2)

## 2022-10-15 LAB — POCT I-STAT 7, (LYTES, BLD GAS, ICA,H+H)
Acid-Base Excess: 1 mmol/L (ref 0.0–2.0)
Acid-base deficit: 1 mmol/L (ref 0.0–2.0)
Bicarbonate: 25.2 mmol/L (ref 20.0–28.0)
Bicarbonate: 25.5 mmol/L (ref 20.0–28.0)
Calcium, Ion: 1.14 mmol/L — ABNORMAL LOW (ref 1.15–1.40)
Calcium, Ion: 1.11 mmol/L — ABNORMAL LOW (ref 1.15–1.40)
HCT: 27 % — ABNORMAL LOW (ref 39.0–52.0)
HCT: 23 % — ABNORMAL LOW (ref 39.0–52.0)
Hemoglobin: 9.2 g/dL — ABNORMAL LOW (ref 13.0–17.0)
Hemoglobin: 7.8 g/dL — ABNORMAL LOW (ref 13.0–17.0)
O2 Saturation: 99 %
O2 Saturation: 100 %
Potassium: 4.1 mmol/L (ref 3.5–5.1)
Potassium: 4.3 mmol/L (ref 3.5–5.1)
Sodium: 138 mmol/L (ref 135–145)
Sodium: 138 mmol/L (ref 135–145)
TCO2: 27 mmol/L (ref 22–32)
TCO2: 27 mmol/L (ref 22–32)
pCO2 arterial: 45.9 mmHg (ref 32–48)
pCO2 arterial: 41.9 mmHg (ref 32–48)
pH, Arterial: 7.349 — ABNORMAL LOW (ref 7.35–7.45)
pH, Arterial: 7.392 (ref 7.35–7.45)
pO2, Arterial: 161 mmHg — ABNORMAL HIGH (ref 83–108)
pO2, Arterial: 182 mmHg — ABNORMAL HIGH (ref 83–108)

## 2022-10-15 LAB — CBC
HCT: 33.5 % — ABNORMAL LOW (ref 39.0–52.0)
Hemoglobin: 10.9 g/dL — ABNORMAL LOW (ref 13.0–17.0)
MCH: 29.3 pg (ref 26.0–34.0)
MCHC: 32.5 g/dL (ref 30.0–36.0)
MCV: 90.1 fL (ref 80.0–100.0)
Platelets: 245 10*3/uL (ref 150–400)
RBC: 3.72 MIL/uL — ABNORMAL LOW (ref 4.22–5.81)
RDW: 15.7 % — ABNORMAL HIGH (ref 11.5–15.5)
WBC: 9.9 10*3/uL (ref 4.0–10.5)
nRBC: 0 % (ref 0.0–0.2)

## 2022-10-15 LAB — GLUCOSE, CAPILLARY
Glucose-Capillary: 95 mg/dL (ref 70–99)
Glucose-Capillary: 127 mg/dL — ABNORMAL HIGH (ref 70–99)
Glucose-Capillary: 114 mg/dL — ABNORMAL HIGH (ref 70–99)
Glucose-Capillary: 118 mg/dL — ABNORMAL HIGH (ref 70–99)
Glucose-Capillary: 129 mg/dL — ABNORMAL HIGH (ref 70–99)
Glucose-Capillary: 139 mg/dL — ABNORMAL HIGH (ref 70–99)

## 2022-10-15 LAB — APTT: aPTT: 34 seconds (ref 24–36)

## 2022-10-15 LAB — SURGICAL PCR SCREEN
MRSA, PCR: NEGATIVE
Staphylococcus aureus: NEGATIVE

## 2022-10-15 LAB — URINALYSIS, ROUTINE W REFLEX MICROSCOPIC
Bilirubin Urine: NEGATIVE
Glucose, UA: NEGATIVE mg/dL
Hgb urine dipstick: NEGATIVE
Ketones, ur: NEGATIVE mg/dL
Leukocytes,Ua: NEGATIVE
Nitrite: NEGATIVE
Protein, ur: NEGATIVE mg/dL
Specific Gravity, Urine: 1.025 (ref 1.005–1.030)
pH: 6 (ref 5.0–8.0)

## 2022-10-15 SURGERY — BYPASS GRAFT FEMORAL-POPLITEAL ARTERY
Anesthesia: General | Site: Leg Upper | Laterality: Left

## 2022-10-15 MED ORDER — HYDRALAZINE HCL 20 MG/ML IJ SOLN: 5.0000 mg | INTRAMUSCULAR | Status: DC | PRN

## 2022-10-15 MED ORDER — LIDOCAINE 2% (20 MG/ML) 5 ML SYRINGE
INTRAMUSCULAR | Status: AC
  Filled 2022-10-15: qty 5

## 2022-10-15 MED ORDER — HEPARIN SODIUM (PORCINE) 1000 UNIT/ML IJ SOLN
INTRAMUSCULAR | Status: DC | PRN
  Administered 2022-10-15: 9000 [IU] via INTRAVENOUS
  Administered 2022-10-15: 2000 [IU] via INTRAVENOUS

## 2022-10-15 MED ORDER — LIDOCAINE 2% (20 MG/ML) 5 ML SYRINGE
INTRAMUSCULAR | Status: DC | PRN
  Administered 2022-10-15: 60 mg via INTRAVENOUS

## 2022-10-15 MED ORDER — CHLORHEXIDINE GLUCONATE CLOTH 2 % EX PADS: 6.0000 | MEDICATED_PAD | Freq: Once | CUTANEOUS | Status: DC

## 2022-10-15 MED ORDER — ATORVASTATIN CALCIUM 40 MG PO TABS
40.0000 mg | ORAL_TABLET | Freq: Every day | ORAL | Status: DC
  Administered 2022-10-16 – 2022-10-18 (×3): 40 mg via ORAL
  Filled 2022-10-15 (×3): qty 1

## 2022-10-15 MED ORDER — ONDANSETRON HCL 4 MG/2ML IJ SOLN
INTRAMUSCULAR | Status: AC
  Filled 2022-10-15: qty 2

## 2022-10-15 MED ORDER — FENTANYL CITRATE (PF) 100 MCG/2ML IJ SOLN: 25.0000 ug | INTRAMUSCULAR | Status: DC | PRN

## 2022-10-15 MED ORDER — HEMOSTATIC AGENTS (NO CHARGE) OPTIME
TOPICAL | Status: DC | PRN
  Administered 2022-10-15: 1 via TOPICAL

## 2022-10-15 MED ORDER — METFORMIN HCL 500 MG PO TABS
1000.0000 mg | ORAL_TABLET | Freq: Two times a day (BID) | ORAL | Status: DC
  Administered 2022-10-16 – 2022-10-18 (×5): 1000 mg via ORAL
  Filled 2022-10-15 (×5): qty 2

## 2022-10-15 MED ORDER — SODIUM CHLORIDE 0.9 % IV SOLN: 500.0000 mL | Freq: Once | INTRAVENOUS | Status: DC | PRN

## 2022-10-15 MED ORDER — GLYCOPYRROLATE PF 0.2 MG/ML IJ SOSY
PREFILLED_SYRINGE | INTRAMUSCULAR | Status: AC
  Filled 2022-10-15: qty 1

## 2022-10-15 MED ORDER — LOSARTAN POTASSIUM 50 MG PO TABS
50.0000 mg | ORAL_TABLET | Freq: Two times a day (BID) | ORAL | Status: DC
  Administered 2022-10-15 – 2022-10-18 (×6): 50 mg via ORAL
  Filled 2022-10-15 (×6): qty 1

## 2022-10-15 MED ORDER — ACETAMINOPHEN 160 MG/5ML PO SOLN: 325.0000 mg | ORAL | Status: DC | PRN

## 2022-10-15 MED ORDER — ALUM & MAG HYDROXIDE-SIMETH 200-200-20 MG/5ML PO SUSP: 15.0000 mL | ORAL | Status: DC | PRN

## 2022-10-15 MED ORDER — PROMETHAZINE HCL 25 MG/ML IJ SOLN: 6.2500 mg | INTRAMUSCULAR | Status: DC | PRN

## 2022-10-15 MED ORDER — MAGNESIUM SULFATE 2 GM/50ML IV SOLN: 2.0000 g | Freq: Every day | INTRAVENOUS | Status: DC | PRN

## 2022-10-15 MED ORDER — OXYCODONE HCL 5 MG PO TABS: 5.0000 mg | ORAL_TABLET | Freq: Once | ORAL | Status: DC | PRN

## 2022-10-15 MED ORDER — ACETAMINOPHEN 650 MG RE SUPP: 325.0000 mg | RECTAL | Status: DC | PRN

## 2022-10-15 MED ORDER — INSULIN ASPART 100 UNIT/ML IJ SOLN: 0.0000 [IU] | INTRAMUSCULAR | Status: DC | PRN

## 2022-10-15 MED ORDER — HYDROCHLOROTHIAZIDE 25 MG PO TABS
25.0000 mg | ORAL_TABLET | Freq: Every day | ORAL | Status: DC
  Administered 2022-10-16 – 2022-10-18 (×3): 25 mg via ORAL
  Filled 2022-10-15 (×3): qty 1

## 2022-10-15 MED ORDER — PROPOFOL 10 MG/ML IV BOLUS
INTRAVENOUS | Status: AC
  Filled 2022-10-15: qty 20

## 2022-10-15 MED ORDER — CEFAZOLIN SODIUM-DEXTROSE 2-4 GM/100ML-% IV SOLN
INTRAVENOUS | Status: AC
  Filled 2022-10-15: qty 100

## 2022-10-15 MED ORDER — GABAPENTIN 300 MG PO CAPS
300.0000 mg | ORAL_CAPSULE | Freq: Every day | ORAL | Status: DC
  Administered 2022-10-15 – 2022-10-17 (×3): 300 mg via ORAL
  Filled 2022-10-15 (×3): qty 1

## 2022-10-15 MED ORDER — OXYCODONE HCL 5 MG PO TABS
5.0000 mg | ORAL_TABLET | ORAL | Status: DC | PRN
  Administered 2022-10-17: 5 mg via ORAL
  Filled 2022-10-15: qty 2
  Filled 2022-10-15: qty 1

## 2022-10-15 MED ORDER — CHLORHEXIDINE GLUCONATE 0.12 % MT SOLN
OROMUCOSAL | Status: AC
  Administered 2022-10-15: 15 mL via OROMUCOSAL
  Filled 2022-10-15: qty 15

## 2022-10-15 MED ORDER — PROPOFOL 10 MG/ML IV BOLUS
INTRAVENOUS | Status: DC | PRN
  Administered 2022-10-15: 100 mg via INTRAVENOUS

## 2022-10-15 MED ORDER — SODIUM CHLORIDE 0.9 % IV SOLN: INTRAVENOUS | Status: DC

## 2022-10-15 MED ORDER — OXYCODONE HCL 5 MG/5ML PO SOLN: 5.0000 mg | Freq: Once | ORAL | Status: DC | PRN

## 2022-10-15 MED ORDER — ACETAMINOPHEN 10 MG/ML IV SOLN: 1000.0000 mg | Freq: Once | INTRAVENOUS | Status: DC | PRN

## 2022-10-15 MED ORDER — DOCUSATE SODIUM 100 MG PO CAPS
100.0000 mg | ORAL_CAPSULE | Freq: Every day | ORAL | Status: DC
  Administered 2022-10-16 – 2022-10-17 (×2): 100 mg via ORAL
  Filled 2022-10-15 (×3): qty 1

## 2022-10-15 MED ORDER — PAPAVERINE HCL 30 MG/ML IJ SOLN
INTRAMUSCULAR | Status: DC | PRN
  Administered 2022-10-15: 60 mg via INTRAVENOUS

## 2022-10-15 MED ORDER — HEPARIN 6000 UNIT IRRIGATION SOLUTION
Status: AC
  Filled 2022-10-15: qty 500

## 2022-10-15 MED ORDER — POLYETHYLENE GLYCOL 3350 17 G PO PACK: 17.0000 g | PACK | Freq: Every day | ORAL | Status: DC | PRN

## 2022-10-15 MED ORDER — ALBUTEROL SULFATE (2.5 MG/3ML) 0.083% IN NEBU: 2.5000 mg | INHALATION_SOLUTION | Freq: Four times a day (QID) | RESPIRATORY_TRACT | Status: DC | PRN

## 2022-10-15 MED ORDER — SUGAMMADEX SODIUM 200 MG/2ML IV SOLN
INTRAVENOUS | Status: DC | PRN
  Administered 2022-10-15: 100 mg via INTRAVENOUS

## 2022-10-15 MED ORDER — ESMOLOL HCL 100 MG/10ML IV SOLN
INTRAVENOUS | Status: AC
  Filled 2022-10-15: qty 10

## 2022-10-15 MED ORDER — AMISULPRIDE (ANTIEMETIC) 5 MG/2ML IV SOLN: 10.0000 mg | Freq: Once | INTRAVENOUS | Status: DC | PRN

## 2022-10-15 MED ORDER — HEPARIN SODIUM (PORCINE) 5000 UNIT/ML IJ SOLN
5000.0000 [IU] | Freq: Three times a day (TID) | INTRAMUSCULAR | Status: DC
  Administered 2022-10-16 – 2022-10-18 (×7): 5000 [IU] via SUBCUTANEOUS
  Filled 2022-10-15 (×7): qty 1

## 2022-10-15 MED ORDER — GUAIFENESIN-DM 100-10 MG/5ML PO SYRP: 15.0000 mL | ORAL_SOLUTION | ORAL | Status: DC | PRN

## 2022-10-15 MED ORDER — HEPARIN 6000 UNIT IRRIGATION SOLUTION
Status: DC | PRN
  Administered 2022-10-15: 1

## 2022-10-15 MED ORDER — ORAL CARE MOUTH RINSE: 15.0000 mL | Freq: Once | OROMUCOSAL | Status: AC

## 2022-10-15 MED ORDER — HYDROMORPHONE HCL 1 MG/ML IJ SOLN: 0.5000 mg | INTRAMUSCULAR | Status: DC | PRN

## 2022-10-15 MED ORDER — PROTAMINE SULFATE 10 MG/ML IV SOLN
INTRAVENOUS | Status: DC | PRN
  Administered 2022-10-15: 20 mg via INTRAVENOUS
  Administered 2022-10-15 (×3): 10 mg via INTRAVENOUS

## 2022-10-15 MED ORDER — ROCURONIUM BROMIDE 10 MG/ML (PF) SYRINGE
PREFILLED_SYRINGE | INTRAVENOUS | Status: DC | PRN
  Administered 2022-10-15 (×3): 20 mg via INTRAVENOUS
  Administered 2022-10-15: 60 mg via INTRAVENOUS

## 2022-10-15 MED ORDER — EPHEDRINE SULFATE-NACL 50-0.9 MG/10ML-% IV SOSY
PREFILLED_SYRINGE | INTRAVENOUS | Status: DC | PRN
  Administered 2022-10-15: 5 mg via INTRAVENOUS
  Administered 2022-10-15: 10 mg via INTRAVENOUS

## 2022-10-15 MED ORDER — CHLORHEXIDINE GLUCONATE 0.12 % MT SOLN: 15.0000 mL | Freq: Once | OROMUCOSAL | Status: AC

## 2022-10-15 MED ORDER — METOPROLOL TARTRATE 5 MG/5ML IV SOLN: 2.0000 mg | INTRAVENOUS | Status: DC | PRN

## 2022-10-15 MED ORDER — INSULIN ASPART 100 UNIT/ML IJ SOLN
0.0000 [IU] | Freq: Three times a day (TID) | INTRAMUSCULAR | Status: DC
  Administered 2022-10-16: 8 [IU] via SUBCUTANEOUS
  Administered 2022-10-16 (×2): 3 [IU] via SUBCUTANEOUS
  Administered 2022-10-17: 8 [IU] via SUBCUTANEOUS
  Administered 2022-10-17: 2 [IU] via SUBCUTANEOUS
  Administered 2022-10-18 (×2): 3 [IU] via SUBCUTANEOUS

## 2022-10-15 MED ORDER — ESMOLOL HCL 100 MG/10ML IV SOLN
INTRAVENOUS | Status: DC | PRN
  Administered 2022-10-15: 20 mg via INTRAVENOUS

## 2022-10-15 MED ORDER — GLYCOPYRROLATE 0.2 MG/ML IJ SOLN
INTRAMUSCULAR | Status: DC | PRN
  Administered 2022-10-15: .2 mg via INTRAVENOUS

## 2022-10-15 MED ORDER — CEFAZOLIN SODIUM-DEXTROSE 2-4 GM/100ML-% IV SOLN
2.0000 g | Freq: Three times a day (TID) | INTRAVENOUS | Status: AC
  Administered 2022-10-15 – 2022-10-16 (×2): 2 g via INTRAVENOUS
  Filled 2022-10-15 (×2): qty 100

## 2022-10-15 MED ORDER — HEPARIN SODIUM (PORCINE) 1000 UNIT/ML IJ SOLN
INTRAMUSCULAR | Status: AC
  Filled 2022-10-15: qty 10

## 2022-10-15 MED ORDER — PHENYLEPHRINE HCL-NACL 20-0.9 MG/250ML-% IV SOLN
INTRAVENOUS | Status: DC | PRN
  Administered 2022-10-15: 60 ug/min via INTRAVENOUS
  Administered 2022-10-15: 75 ug/min via INTRAVENOUS

## 2022-10-15 MED ORDER — PHENOL 1.4 % MT LIQD: 1.0000 | OROMUCOSAL | Status: DC | PRN

## 2022-10-15 MED ORDER — POTASSIUM CHLORIDE CRYS ER 20 MEQ PO TBCR: 20.0000 meq | EXTENDED_RELEASE_TABLET | Freq: Every day | ORAL | Status: DC | PRN

## 2022-10-15 MED ORDER — ACETAMINOPHEN 325 MG PO TABS
325.0000 mg | ORAL_TABLET | ORAL | Status: DC | PRN
  Administered 2022-10-17: 650 mg via ORAL
  Filled 2022-10-15: qty 2

## 2022-10-15 MED ORDER — ONDANSETRON HCL 4 MG/2ML IJ SOLN
INTRAMUSCULAR | Status: DC | PRN
  Administered 2022-10-15: 4 mg via INTRAVENOUS

## 2022-10-15 MED ORDER — LABETALOL HCL 5 MG/ML IV SOLN: 10.0000 mg | INTRAVENOUS | Status: DC | PRN

## 2022-10-15 MED ORDER — CLOPIDOGREL BISULFATE 75 MG PO TABS
75.0000 mg | ORAL_TABLET | Freq: Every day | ORAL | Status: DC
  Administered 2022-10-16 – 2022-10-18 (×3): 75 mg via ORAL
  Filled 2022-10-15 (×3): qty 1

## 2022-10-15 MED ORDER — ACETAMINOPHEN 325 MG PO TABS: 325.0000 mg | ORAL_TABLET | ORAL | Status: DC | PRN

## 2022-10-15 MED ORDER — ALBUMIN HUMAN 5 % IV SOLN: INTRAVENOUS | Status: DC | PRN

## 2022-10-15 MED ORDER — EPHEDRINE 5 MG/ML INJ
INTRAVENOUS | Status: AC
  Filled 2022-10-15: qty 5

## 2022-10-15 MED ORDER — ONDANSETRON HCL 4 MG/2ML IJ SOLN
4.0000 mg | Freq: Four times a day (QID) | INTRAMUSCULAR | Status: DC | PRN
  Administered 2022-10-15: 4 mg via INTRAVENOUS
  Filled 2022-10-15: qty 2

## 2022-10-15 MED ORDER — LACTATED RINGERS IV SOLN: INTRAVENOUS | Status: DC

## 2022-10-15 MED ORDER — PANTOPRAZOLE SODIUM 40 MG PO TBEC
40.0000 mg | DELAYED_RELEASE_TABLET | Freq: Every day | ORAL | Status: DC
  Administered 2022-10-16 – 2022-10-18 (×3): 40 mg via ORAL
  Filled 2022-10-15 (×3): qty 1

## 2022-10-15 MED ORDER — FENTANYL CITRATE (PF) 250 MCG/5ML IJ SOLN
INTRAMUSCULAR | Status: AC
  Filled 2022-10-15: qty 5

## 2022-10-15 MED ORDER — ROCURONIUM BROMIDE 10 MG/ML (PF) SYRINGE
PREFILLED_SYRINGE | INTRAVENOUS | Status: AC
  Filled 2022-10-15: qty 20

## 2022-10-15 MED ORDER — 0.9 % SODIUM CHLORIDE (POUR BTL) OPTIME
TOPICAL | Status: DC | PRN
  Administered 2022-10-15: 2000 mL

## 2022-10-15 MED ORDER — PAPAVERINE HCL 30 MG/ML IJ SOLN
INTRAMUSCULAR | Status: AC
  Filled 2022-10-15: qty 2

## 2022-10-15 MED ORDER — CEFAZOLIN SODIUM-DEXTROSE 2-4 GM/100ML-% IV SOLN
2.0000 g | INTRAVENOUS | Status: AC
  Administered 2022-10-15: 2 g via INTRAVENOUS

## 2022-10-15 MED ORDER — INSULIN GLARGINE-YFGN 100 UNIT/ML ~~LOC~~ SOLN
38.0000 [IU] | Freq: Every day | SUBCUTANEOUS | Status: DC
  Administered 2022-10-16 – 2022-10-18 (×3): 38 [IU] via SUBCUTANEOUS
  Filled 2022-10-15 (×3): qty 0.38

## 2022-10-15 MED ORDER — PROTAMINE SULFATE 10 MG/ML IV SOLN
INTRAVENOUS | Status: AC
  Filled 2022-10-15: qty 5

## 2022-10-15 MED ORDER — FENTANYL CITRATE (PF) 250 MCG/5ML IJ SOLN
INTRAMUSCULAR | Status: DC | PRN
  Administered 2022-10-15 (×5): 50 ug via INTRAVENOUS

## 2022-10-15 MED ORDER — ASPIRIN 81 MG PO TBEC
81.0000 mg | DELAYED_RELEASE_TABLET | Freq: Every day | ORAL | Status: DC
  Administered 2022-10-16 – 2022-10-18 (×3): 81 mg via ORAL
  Filled 2022-10-15 (×3): qty 1

## 2022-10-15 SURGICAL SUPPLY — 76 items
ADH SKN CLS APL DERMABOND .7 (GAUZE/BANDAGES/DRESSINGS) ×2
AGENT HMST 10 BLLW SHRT CANN (HEMOSTASIS) ×2
BAG COUNTER SPONGE SURGICOUNT (BAG) ×2 IMPLANT
BAG ISL DRAPE 18X18 STRL (DRAPES) ×2
BAG ISOLATION DRAPE 18X18 (DRAPES) IMPLANT
BAG SPNG CNTER NS LX DISP (BAG) ×2
BANDAGE ESMARK 6X9 LF (GAUZE/BANDAGES/DRESSINGS) IMPLANT
BNDG CMPR 5X4 KNIT ELC UNQ LF (GAUZE/BANDAGES/DRESSINGS) ×2
BNDG CMPR 9X6 STRL LF SNTH (GAUZE/BANDAGES/DRESSINGS)
BNDG ELASTIC 4INX 5YD STR LF (GAUZE/BANDAGES/DRESSINGS) IMPLANT
BNDG ESMARK 6X9 LF (GAUZE/BANDAGES/DRESSINGS)
CANISTER SUCT 3000ML PPV (MISCELLANEOUS) ×2 IMPLANT
CANNULA VESSEL 3MM 2 BLNT TIP (CANNULA) ×4 IMPLANT
CATH EMB 4FR 40 (CATHETERS) IMPLANT
CLIP TI MEDIUM 24 (CLIP) ×2 IMPLANT
CLIP TI WIDE RED SMALL 24 (CLIP) ×2 IMPLANT
COVER PROBE W GEL 5X96 (DRAPES) IMPLANT
CUFF TOURN SGL QUICK 24 (TOURNIQUET CUFF) ×2
CUFF TOURN SGL QUICK 34 (TOURNIQUET CUFF)
CUFF TOURN SGL QUICK 42 (TOURNIQUET CUFF) IMPLANT
CUFF TRNQT CYL 24X4X16.5-23 (TOURNIQUET CUFF) IMPLANT
CUFF TRNQT CYL 24X4X40X1 (TOURNIQUET CUFF) IMPLANT
CUFF TRNQT CYL 34X4.125X (TOURNIQUET CUFF) IMPLANT
DERMABOND ADVANCED .7 DNX12 (GAUZE/BANDAGES/DRESSINGS) ×2 IMPLANT
DRAIN CHANNEL 15F RND FF W/TCR (WOUND CARE) IMPLANT
DRAPE HALF SHEET 40X57 (DRAPES) IMPLANT
DRAPE X-RAY CASS 24X20 (DRAPES) IMPLANT
DRESSING PEEL AND PLC PRVNA 13 (GAUZE/BANDAGES/DRESSINGS) IMPLANT
DRSG PEEL AND PLACE PREVENA 13 (GAUZE/BANDAGES/DRESSINGS) ×2
ELECT REM PT RETURN 9FT ADLT (ELECTROSURGICAL) ×2
ELECTRODE REM PT RTRN 9FT ADLT (ELECTROSURGICAL) ×2 IMPLANT
EVACUATOR SILICONE 100CC (DRAIN) IMPLANT
GAUZE SPONGE 4X4 12PLY STRL (GAUZE/BANDAGES/DRESSINGS) IMPLANT
GLOVE BIO SURGEON STRL SZ7.5 (GLOVE) ×2 IMPLANT
GLOVE BIOGEL PI IND STRL 8 (GLOVE) ×2 IMPLANT
GLOVE SURG POLY ORTHO LF SZ7.5 (GLOVE) IMPLANT
GLOVE SURG UNDER LTX SZ8 (GLOVE) ×2 IMPLANT
GOWN STRL REUS W/ TWL LRG LVL3 (GOWN DISPOSABLE) ×6 IMPLANT
GOWN STRL REUS W/TWL LRG LVL3 (GOWN DISPOSABLE) ×6
GRAFT VASC PATCH XENOSURE 1X14 (Vascular Products) IMPLANT
HEMOSTAT HEMOBLAST BELLOWS (HEMOSTASIS) IMPLANT
KIT BASIN OR (CUSTOM PROCEDURE TRAY) ×2 IMPLANT
KIT DRSG PREVENA PLUS 7DAY 125 (MISCELLANEOUS) IMPLANT
KIT TURNOVER KIT B (KITS) ×2 IMPLANT
LOOP VASCULAR MINI 18 RED (MISCELLANEOUS) ×2
MARKER GRAFT CORONARY BYPASS (MISCELLANEOUS) IMPLANT
NS IRRIG 1000ML POUR BTL (IV SOLUTION) ×4 IMPLANT
PACK PERIPHERAL VASCULAR (CUSTOM PROCEDURE TRAY) ×2 IMPLANT
PAD ARMBOARD 7.5X6 YLW CONV (MISCELLANEOUS) ×4 IMPLANT
PADDING CAST ABS COTTON 4X4 ST (CAST SUPPLIES) IMPLANT
SET COLLECT BLD 21X3/4 12 (NEEDLE) IMPLANT
SLEEVE SURGEON STRL (DRAPES) IMPLANT
SPONGE INTESTINAL PEANUT (DISPOSABLE) IMPLANT
SPONGE T-LAP 18X18 ~~LOC~~+RFID (SPONGE) IMPLANT
STOPCOCK 4 WAY LG BORE MALE ST (IV SETS) IMPLANT
SUT ETHILON 3 0 PS 1 (SUTURE) IMPLANT
SUT MNCRL AB 4-0 PS2 18 (SUTURE) ×4 IMPLANT
SUT PROLENE 5 0 C 1 24 (SUTURE) ×2 IMPLANT
SUT PROLENE 6 0 BV (SUTURE) ×2 IMPLANT
SUT SILK 2 0 (SUTURE) ×2
SUT SILK 2 0 SH (SUTURE) ×2 IMPLANT
SUT SILK 2-0 18XBRD TIE 12 (SUTURE) IMPLANT
SUT SILK 3 0 (SUTURE) ×6
SUT SILK 3-0 18XBRD TIE 12 (SUTURE) IMPLANT
SUT VIC AB 2-0 CTB1 (SUTURE) ×4 IMPLANT
SUT VIC AB 3-0 SH 27 (SUTURE) ×10
SUT VIC AB 3-0 SH 27X BRD (SUTURE) ×4 IMPLANT
SYR 30ML LL (SYRINGE) IMPLANT
SYR 3ML LL SCALE MARK (SYRINGE) IMPLANT
TAPE STRIPS DRAPE STRL (GAUZE/BANDAGES/DRESSINGS) IMPLANT
TOWEL GREEN STERILE (TOWEL DISPOSABLE) ×2 IMPLANT
TRAY FOLEY MTR SLVR 16FR STAT (SET/KITS/TRAYS/PACK) ×2 IMPLANT
TUBING EXTENTION W/L.L. (IV SETS) IMPLANT
UNDERPAD 30X36 HEAVY ABSORB (UNDERPADS AND DIAPERS) ×2 IMPLANT
VASCULAR TIE MINI RED 18IN STL (MISCELLANEOUS) IMPLANT
WATER STERILE IRR 1000ML POUR (IV SOLUTION) ×2 IMPLANT

## 2022-10-15 NOTE — Progress Notes (Signed)
VASCULAR SURGERY:  Doing well postop.  He has a brisk anterior tibial signal with Doppler.  His Praveena has a good seal.  Cari Caraway, MD 3:46 PM

## 2022-10-15 NOTE — Op Note (Signed)
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 Edilia Bo, MD  ASSIST: Kayren Eaves, PA  ANESTHESIA: General  EBL: 100 cc  INDICATIONS:    Randy Jacobson is a 77 y.o. male who presented with a nonhealing wound of his left great toe and multilevel arterial occlusive disease.  He underwent an arteriogram and had a stenosis of the left external iliac artery which was successfully addressed with angioplasty and stenting.  He presents now for iliofemoral endarterectomy and fem pop bypass grafting.  FINDINGS:   Excellent anterior tibial and dorsalis pedis signal at the completion of the procedure.  This is his only runoff.  TECHNIQUE:   The patient was taken to the operating room and received general anesthetic.  I looked at the left great saphenous vein myself with the SonoSite.  It did become smaller below the knee but I felt it was adequate for autogenous bypass.  Longitudinal incision was made in the left groin after the left lower extremity was prepped and draped in usual sterile fashion.  The common femoral, external iliac artery, deep femoral, and superficial femoral arteries were all controlled.  Of note the patient had extensive calcific plaque which extended well up into the external iliac artery beneath inguinal ligament.  I was ultimately able to get high enough to get placed right could clamp proximally.  Using 5 additional incisions along the medial aspect of the left leg the great saphenous vein was harvested down to the mid calf.  Branches were divided tween clips and 3-0 silk ties.  Through the distal incision the  below-knee popliteal artery was exposed.  A tunnel was created from this incision to the groin incision.  The patient was heparinized.  The vein was ligated distally and irrigated up nicely with heparinized saline.  The saphenofemoral junction was clamped and the saphenous vein excised from the femoral vein.  This was oversewn with a 5-0 Prolene suture.  Next the external iliac artery, superficial femoral, and deep femoral arteries were all controlled.  A longitudinal arteriotomy was made in the common femoral artery and it was extended up into the external iliac artery and down onto the superficial femoral artery.  Extensive endarterectomy was then performed and I was able to open the proximal profunda and leave a segment of patent superficial femoral artery for potential endovascular options in the future.  Proximally there was a good endpoint.  All loose debris was removed.  A bovine pericardial patch was then sewn using continuous 5-0 Prolene suture.  The vein was spatulated and sewn in a nonreversed fashion.  A longitudinal graftotomy was made and the bovine pericardial patch and the vein was sewn into side to the patch using continuous 6-0 Prolene suture.  Prior to completing anastomosis the artery was backbled and flushed appropriately and the anastomosis completed.  Of note there was excellent inflow.  Next using a retrograde Mills valvulotome the valves were all lysed.  The vein was marked and prevent twisting.  It was brought through the previously created tunnel for anastomosis to the below-knee popliteal artery.  A tourniquet was placed on the thigh.  The  leg was exsanguinated with an Esmarch bandage.  The tourniquet was inflated to 300 mmHg.  Under tourniquet control longitudinal arteriotomy was made in the below-knee popliteal artery.  The vein was spatulated and sewn end-to-side to the artery using continuous 6-0 Prolene suture.  Prior to completing this anastomosis the tourniquet was released.  The  arteries were backbled and flushed appropriately and the anastomosis completed.  Flow was established of the left leg and there was an excellent anterior tibial signal at the completion of the procedure.  Hemostasis was obtained in all the wounds.  Each of the vein harvest incisions was closed with 2 deep layers of 3-0 Vicryl and the skin closed with 4-0 Monocryl.  The groin incision was closed with a deep layer of 2-0 Vicryl, 2 layers of 3-0 Vicryl and the skin closed with 4-0 Monocryl.  A Prevena was placed on the left groin.  Patient tolerated the procedure well was transferred to the recovery room in stable condition.  All needle and sponge counts were correct.   Given the complexity of the case,  the assistant was necessary in order to expedient the procedure and safely perform the technical aspects of the operation.  The assistant provided traction and countertraction to assist with exposure of the artery proximally and distally.  They assisted with suture ligature of multiple branches.  Their assistance was critical in the performance of both the proximal and distal anastomosis.These skills, especially following the Prolene suture for the anastomosis, could not have been adequately performed by a scrub tech assistant.   Waverly Ferrari, MD, FACS [facility]  DATE OF DICTATION:   10/15/2022

## 2022-10-15 NOTE — Transfer of Care (Signed)
Immediate Anesthesia Transfer of Care Note  Patient: Randy Jacobson  Procedure(s) Performed: LEFT FEMORAL-BELOW KNEE POPLITEAL ARTERY BYPASS WITH VEIN HARVEST OF LEFT GREATER SAPHENOUS VEIN (Left: Leg Upper) PATCH ANGIOPLASTY USING XENOSURE BIOLOGIC PATCH [id number] (Left: Groin) ENDARTERECTOMY OF LEFT ILIOFEMORAL ARTERY (Left: Groin)  Patient Location: PACU  Anesthesia Type:General  Level of Consciousness: awake, alert , and oriented  Airway & Oxygen Therapy: Patient Spontanous Breathing and Patient connected to nasal cannula oxygen  Post-op Assessment: Report given to RN, Post -op Vital signs reviewed and stable, and Patient moving all extremities X 4  Post vital signs: Reviewed and stable  Last Vitals:  Vitals Value Taken Time  BP 138/89 10/15/22 1315  Temp    Pulse 98 10/15/22 1318  Resp 21 10/15/22 1318  SpO2 98 % 10/15/22 1318  Vitals shown include unvalidated device data.  Last Pain: There were no vitals filed for this visit.       Complications: No notable events documented.

## 2022-10-15 NOTE — Interval H&P Note (Signed)
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  The various methods of treatment have been discussed with the patient and family. After consideration of risks, benefits and other options for treatment, the patient has consented to  Procedure(s): LEFT FEMORAL-BELOW KNEE POPLITEAL ARTERY BYPASS WITH VEIN (Left) as a surgical intervention.  The patient's history has been reviewed, patient examined, no change in status, stable for surgery.  I have reviewed the patient's chart and labs.  Questions were answered to the patient's satisfaction.     Waverly Ferrari

## 2022-10-15 NOTE — Anesthesia Procedure Notes (Signed)
Procedure Name: Intubation Date/Time: 10/15/2022 7:42 AM  Performed by: Marena Chancy, CRNAPre-anesthesia Checklist: Patient identified, Emergency Drugs available, Suction available and Patient being monitored Patient Re-evaluated:Patient Re-evaluated prior to induction Oxygen Delivery Method: Circle System Utilized Preoxygenation: Pre-oxygenation with 100% oxygen Induction Type: IV induction Ventilation: Mask ventilation without difficulty Laryngoscope Size: Mac and 4 Grade View: Grade I Tube type: Oral Tube size: 7.5 mm Number of attempts: 1 Airway Equipment and Method: Stylet and Oral airway Placement Confirmation: ETT inserted through vocal cords under direct vision, positive ETCO2 and breath sounds checked- equal and bilateral Tube secured with: Tape Dental Injury: Teeth and Oropharynx as per pre-operative assessment

## 2022-10-15 NOTE — Anesthesia Procedure Notes (Addendum)
Arterial Line Insertion Start/End7/08/2022 7:54 AM, 10/15/2022 7:54 AM Performed by: Shelton Silvas, MD, anesthesiologist  Patient location: OR. Preanesthetic checklist: patient identified, IV checked, site marked, risks and benefits discussed, surgical consent, monitors and equipment checked, pre-op evaluation, timeout performed and anesthesia consent Right, radial was placed Catheter size: 20 G Hand hygiene performed  and maximum sterile barriers used   Attempts: 1 Procedure performed without using ultrasound guided technique. Ultrasound Notes:anatomy identified, needle tip was noted to be adjacent to the nerve/plexus identified and no ultrasound evidence of intravascular and/or intraneural injection Following insertion, dressing applied and Biopatch. Post procedure assessment: normal  Post procedure complications: second provider assisted. Patient tolerated the procedure well with no immediate complications.

## 2022-10-16 ENCOUNTER — Other Ambulatory Visit: Payer: Self-pay

## 2022-10-16 ENCOUNTER — Encounter (HOSPITAL_COMMUNITY): Payer: Self-pay | Admitting: Vascular Surgery

## 2022-10-16 LAB — CBC
HCT: 19.2 % — ABNORMAL LOW (ref 39.0–52.0)
Hemoglobin: 6.1 g/dL — CL (ref 13.0–17.0)
MCH: 28.9 pg (ref 26.0–34.0)
MCHC: 31.8 g/dL (ref 30.0–36.0)
MCV: 91 fL (ref 80.0–100.0)
Platelets: 198 10*3/uL (ref 150–400)
RBC: 2.11 MIL/uL — ABNORMAL LOW (ref 4.22–5.81)
RDW: 16 % — ABNORMAL HIGH (ref 11.5–15.5)
WBC: 15.6 10*3/uL — ABNORMAL HIGH (ref 4.0–10.5)
nRBC: 0 % (ref 0.0–0.2)

## 2022-10-16 LAB — BASIC METABOLIC PANEL
Anion gap: 11 (ref 5–15)
BUN: 26 mg/dL — ABNORMAL HIGH (ref 8–23)
CO2: 21 mmol/L — ABNORMAL LOW (ref 22–32)
Calcium: 7.6 mg/dL — ABNORMAL LOW (ref 8.9–10.3)
Chloride: 100 mmol/L (ref 98–111)
Creatinine, Ser: 1.29 mg/dL — ABNORMAL HIGH (ref 0.61–1.24)
GFR, Estimated: 57 mL/min — ABNORMAL LOW (ref >60)
Glucose, Bld: 191 mg/dL — ABNORMAL HIGH (ref 70–99)
Potassium: 3.7 mmol/L (ref 3.5–5.1)
Sodium: 132 mmol/L — ABNORMAL LOW (ref 135–145)

## 2022-10-16 LAB — GLUCOSE, CAPILLARY
Glucose-Capillary: 184 mg/dL — ABNORMAL HIGH (ref 70–99)
Glucose-Capillary: 265 mg/dL — ABNORMAL HIGH (ref 70–99)
Glucose-Capillary: 156 mg/dL — ABNORMAL HIGH (ref 70–99)
Glucose-Capillary: 156 mg/dL — ABNORMAL HIGH (ref 70–99)

## 2022-10-16 LAB — CBC WITH DIFFERENTIAL/PLATELET
Abs Immature Granulocytes: 0.12 10*3/uL — ABNORMAL HIGH (ref 0.00–0.07)
Basophils Absolute: 0 10*3/uL (ref 0.0–0.1)
Basophils Relative: 0 %
Eosinophils Absolute: 0.1 10*3/uL (ref 0.0–0.5)
Eosinophils Relative: 1 %
HCT: 24.9 % — ABNORMAL LOW (ref 39.0–52.0)
Hemoglobin: 8.5 g/dL — ABNORMAL LOW (ref 13.0–17.0)
Immature Granulocytes: 1 %
Lymphocytes Relative: 8 %
Lymphs Abs: 1.2 10*3/uL (ref 0.7–4.0)
MCH: 30.1 pg (ref 26.0–34.0)
MCHC: 34.1 g/dL (ref 30.0–36.0)
MCV: 88.3 fL (ref 80.0–100.0)
Monocytes Absolute: 3.5 10*3/uL — ABNORMAL HIGH (ref 0.1–1.0)
Monocytes Relative: 22 %
Neutro Abs: 10.8 10*3/uL — ABNORMAL HIGH (ref 1.7–7.7)
Neutrophils Relative %: 68 %
Platelets: 185 10*3/uL (ref 150–400)
RBC: 2.82 MIL/uL — ABNORMAL LOW (ref 4.22–5.81)
RDW: 16.1 % — ABNORMAL HIGH (ref 11.5–15.5)
WBC: 15.7 10*3/uL — ABNORMAL HIGH (ref 4.0–10.5)
nRBC: 0.1 % (ref 0.0–0.2)

## 2022-10-16 LAB — PREPARE RBC (CROSSMATCH)

## 2022-10-16 LAB — TYPE AND SCREEN: Unit division: 0

## 2022-10-16 LAB — LIPID PANEL
Cholesterol: 69 mg/dL (ref 0–200)
HDL: 25 mg/dL — ABNORMAL LOW (ref >40)
LDL Cholesterol: 25 mg/dL (ref 0–99)
Total CHOL/HDL Ratio: 2.8 RATIO
Triglycerides: 93 mg/dL (ref <150)
VLDL: 19 mg/dL (ref 0–40)

## 2022-10-16 LAB — BPAM RBC: Unit Type and Rh: 9500

## 2022-10-16 MED ORDER — SODIUM CHLORIDE 0.9% IV SOLUTION: Freq: Once | INTRAVENOUS | Status: AC

## 2022-10-16 NOTE — Evaluation (Signed)
Occupational Therapy Evaluation Patient Details Name: Randy Jacobson MRN: [id number] DOB: [birth year] Today's Date: 10/16/2022   History of Present Illness Pt is a 77 y.o. male who presented 10/15/22 for L femoral-below knee popliteal artery bypass. Of note, pt recently admitted 10/11/22 - 10/12/22 for arteriography, angioplasty and stenting of the left common and external iliac artery secondary to a nonhealing wound of his left great toe and evidence of infrainguinal arterial occlusive disease. PMH: DM, HTN, peripheral artery disease   Clinical Impression   Pt is typically independent and lives alone. Presents with "soreness" and guarding of L LE and impaired standing balance with heavy reliance on UEs for ambulation with RW. Pt requires min assist for mobility and set up to max assist for ADLs. Began educating pt in compensatory strategies for LB bathing and dressing. He owns a Sports administrator and long handled bath sponge. Recommended 3 in 1 and educated in use to elevate toilet and as a shower seat, pt declined. Questionable receptiveness to education, pt  with poor hearing vs minimizing deficits.  Will follow acutely.      Recommendations for follow up therapy are one component of a multi-disciplinary discharge planning process, led by the attending physician.  Recommendations may be updated based on patient status, additional functional criteria and insurance authorization.   Assistance Recommended at Discharge Intermittent Supervision/Assistance  Patient can return home with the following A little help with walking and/or transfers;A lot of help with bathing/dressing/bathroom;Assistance with cooking/housework;Help with stairs or ramp for entrance    Functional Status Assessment  Patient has had a recent decline in their functional status and demonstrates the ability to make significant improvements in function in a reasonable and predictable amount of time.  Equipment Recommendations  Other (comment) (RW)     Recommendations for Other Services       Precautions / Restrictions Precautions Precautions: Fall Precaution Comments: wound vac L groin Restrictions Weight Bearing Restrictions: No      Mobility Bed Mobility Overal bed mobility: Needs Assistance Bed Mobility: Sit to Supine       Sit to supine: Min assist   General bed mobility comments: assist for L LE back into bed    Transfers Overall transfer level: Needs assistance Equipment used: Rolling walker (2 wheels) Transfers: Sit to/from Stand Sit to Stand: Min assist           General transfer comment: assist to rise, steady and control descent      Balance Overall balance assessment: Needs assistance   Sitting balance-Leahy Scale: Good     Standing balance support: Bilateral upper extremity supported, During functional activity, Reliant on assistive device for balance Standing balance-Leahy Scale: Poor Standing balance comment: cannot release walker in static standing                           ADL either performed or assessed with clinical judgement   ADL Overall ADL's : Needs assistance/impaired Eating/Feeding: Independent;Sitting   Grooming: Wash/dry hands;Wash/dry face;Sitting;Set up   Upper Body Bathing: Set up;Sitting   Lower Body Bathing: Maximal assistance;Sit to/from stand   Upper Body Dressing : Set up;Sitting   Lower Body Dressing: Maximal assistance;Sit to/from stand   Toilet Transfer: Minimal assistance;Ambulation;Rolling walker (2 wheels)   Toileting- Clothing Manipulation and Hygiene: Moderate assistance;Sit to/from stand       Functional mobility during ADLs: Minimal assistance;Rolling walker (2 wheels)       Vision Ability to See in Adequate Light:  0 Adequate Patient Visual Report: No change from baseline       Perception     Praxis      Pertinent Vitals/Pain Pain Assessment Pain Assessment: Faces Faces Pain Scale: Hurts even more Pain Location: L  leg Pain Descriptors / Indicators: Discomfort, Grimacing, Guarding, Sore Pain Intervention(s): Premedicated before session, Monitored during session, Limited activity within patient's tolerance     Hand Dominance Right   Extremity/Trunk Assessment Upper Extremity Assessment Upper Extremity Assessment: Overall WFL for tasks assessed   Lower Extremity Assessment Lower Extremity Assessment: Defer to PT evaluation   Cervical / Trunk Assessment Cervical / Trunk Assessment: Normal   Communication Communication Communication: HOH (?)   Cognition Arousal/Alertness: Awake/alert Behavior During Therapy: Flat affect Overall Cognitive Status: Difficult to assess                                 General Comments: slow response speed, poor insight into what he needs to be able to do to go home     General Comments       Exercises     Shoulder Instructions      Home Living Family/patient expects to be discharged to:: Private residence Living Arrangements: Alone Available Help at Discharge: Friend(s);Available PRN/intermittently (girlfriend) Type of Home: House Home Access: Stairs to enter [employer] of Steps: 5 Entrance Stairs-Rails: Can reach both Home Layout: One level     Bathroom Shower/Tub: Producer, television/film/video: Standard     Home Equipment: Mudlogger: Reacher        Prior Functioning/Environment Prior Level of Function : Independent/Modified Independent;Driving             Mobility Comments: No AD, no falls          OT Problem List: Decreased strength;Decreased activity tolerance;Impaired balance (sitting and/or standing);Decreased cognition;Decreased safety awareness;Pain;Decreased knowledge of use of DME or AE      OT Treatment/Interventions: Self-care/ADL training    OT Goals(Current goals can be found in the care plan section) Acute Rehab OT Goals OT Goal Formulation: With  patient Time For Goal Achievement: 10/30/22 Potential to Achieve Goals: Good ADL Goals Pt Will Perform Grooming: with modified independence;standing Pt Will Perform Lower Body Bathing: with modified independence;sit to/from stand;with adaptive equipment Pt Will Perform Lower Body Dressing: with modified independence;sit to/from stand;with adaptive equipment Pt Will Transfer to Toilet: with modified independence;ambulating;regular height toilet Pt Will Perform Toileting - Clothing Manipulation and hygiene: with modified independence;sit to/from stand Pt Will Perform Tub/Shower Transfer: Shower transfer;with supervision;ambulating;rolling walker Additional ADL Goal #1: Pt will complete bed mobility mod I.  OT Frequency: Min 1X/week    Co-evaluation              AM-PAC OT "6 Clicks" Daily Activity     Outcome Measure Help from another person eating meals?: None Help from another person taking care of personal grooming?: A Little Help from another person toileting, which includes using toliet, bedpan, or urinal?: A Lot Help from another person bathing (including washing, rinsing, drying)?: A Lot Help from another person to put on and taking off regular upper body clothing?: A Little Help from another person to put on and taking off regular lower body clothing?: A Lot 6 Click Score: 16   End of Session Equipment Utilized During Treatment: Gait belt;Rolling walker (2 wheels) Nurse Communication: Mobility status  Activity Tolerance: Patient limited by pain Patient  left: in bed;with call bell/phone within reach;with bed alarm set  OT Visit Diagnosis: Unsteadiness on feet (R26.81);Other abnormalities of gait and mobility (R26.89);Pain;Muscle weakness (generalized) (M62.81);Other symptoms and signs involving cognitive function Pain - Right/Left: Left Pain - part of body: Leg                Time: 1010-1030 OT Time Calculation (min): 20 min Charges:  OT General Charges $OT Visit: 1  Visit OT Evaluation $OT Eval Moderate Complexity: 1 Mod  Berna Spare, OTR/L Acute Rehabilitation Services Office: [phone]   Evern Bio 10/16/2022, 3:20 PM

## 2022-10-16 NOTE — Progress Notes (Signed)
Labs showed Hgb of 6.1 this AM.  Informed the on call vascular surgeon, Dr. Randie Heinz, who ordered to give two Units of RBCs.  Will continue to monitor.  Harriet Masson, RN

## 2022-10-16 NOTE — Progress Notes (Addendum)
  Progress Note    10/16/2022 7:35 AM 1 Day Post-Op  Subjective:  no complaints. Denies dizziness or weakness    Vitals:   10/16/22 0452 10/16/22 0532  BP: (!) 99/56 (!) 123/52  Pulse: (!) 101 98  Resp: 14 17  Temp: 98.7 F (37.1 C) 98.5 F (36.9 C)  SpO2: 100% 98%    Physical Exam: General:  resting comfortably, currently getting PRBCs Cardiac:  regular Lungs:  nonlabored Incisions:  LLE incisions intact and dry without hematoma. L groin incision with intact prevena Extremities:  brisk L AT doppler signal   CBC    Component Value Date/Time   WBC 15.6 (H) 10/16/2022 0306   RBC 2.11 (L) 10/16/2022 0306   HGB 6.1 (LL) 10/16/2022 0306   HGB 9.4 (L) 05/21/2019 1205   HCT 19.2 (L) 10/16/2022 0306   HCT 29.3 (L) 05/21/2019 1205   PLT 198 10/16/2022 0306   PLT 245 05/21/2019 1205   MCV 91.0 10/16/2022 0306   MCV 73 (L) 05/21/2019 1205   MCH 28.9 10/16/2022 0306   MCHC 31.8 10/16/2022 0306   RDW 16.0 (H) 10/16/2022 0306   RDW 15.7 (H) 05/21/2019 1205   LYMPHSABS 0.5 (L) 05/21/2019 1205   MONOABS 0.5 04/08/2017 1714   EOSABS 0.0 05/21/2019 1205   BASOSABS 0.1 05/21/2019 1205    BMET    Component Value Date/Time   NA 132 (L) 10/16/2022 0306   NA 132 (L) 05/21/2019 1205   K 3.7 10/16/2022 0306   CL 100 10/16/2022 0306   CO2 21 (L) 10/16/2022 0306   GLUCOSE 191 (H) 10/16/2022 0306   BUN 26 (H) 10/16/2022 0306   BUN 18 05/21/2019 1205   CREATININE 1.29 (H) 10/16/2022 0306   CALCIUM 7.6 (L) 10/16/2022 0306   GFRNONAA 57 (L) 10/16/2022 0306   GFRAA 94 05/21/2019 1205    INR    Component Value Date/Time   INR 1.1 10/15/2022 0634     Intake/Output Summary (Last 24 hours) at 10/16/2022 0735 Last data filed at 10/16/2022 0526 Gross per 24 hour  Intake 4182.63 ml  Output 1174 ml  Net 3008.63 ml      Assessment/Plan:  77 y.o. male is 1 day post op, s/p: left iliofemoral endarterectomy with left femoral to BK pop bypass with vein  -Feeling fine this  morning. Has some mild pain in the left leg at incision sites -LLE incisions are intact without hematoma or erythema. L groin incision intact with prevena. This will stay on for about 7 days  -Left GT gangrene dry and unchanged -LLE warm and well perfused with a brisk AT doppler signal -Acute surgical blood loss anemia with hgb this AM at 6.1. He was ordered to get 2U PRBCs. He is currently getting 1 unit. Repeat H/H when transfusion complete -Can work with PT/OT later today after repeat H/H stable   Loel Dubonnet, PA-C [facility] [phone] 10/16/2022 7:35 AM   I have independently interviewed and examined patient and agree with PA assessment and plan above.   Kalandra Masters C. Randie Heinz, MD [facility] Office: [phone] Pager: [phone]

## 2022-10-16 NOTE — Progress Notes (Signed)
PHARMACIST LIPID MONITORING   Randy Jacobson is a 77 y.o. male admitted on 10/15/2022 with peripheral artery disease.  Pharmacy has been consulted to optimize lipid-lowering therapy with the indication of secondary prevention for clinical ASCVD.  Recent Labs:  Lipid Panel (last 6 months):   Lab Results  Component Value Date   CHOL 69 10/16/2022   TRIG 93 10/16/2022   HDL 25 (L) 10/16/2022   CHOLHDL 2.8 10/16/2022   VLDL 19 10/16/2022   LDLCALC 25 10/16/2022    Hepatic function panel (last 6 months):   Lab Results  Component Value Date   AST 28 10/15/2022   ALT 24 10/15/2022   ALKPHOS 76 10/15/2022   BILITOT 0.7 10/15/2022    SCr (since admission):   Serum creatinine: 1.29 mg/dL (H) 65/78/46 9629 Estimated creatinine clearance: 50.3 mL/min (A)  Current therapy and lipid therapy tolerance Current lipid-lowering therapy: atorvastatin 40mg  once daily  Documented or reported allergies or intolerances to lipid-lowering therapies (if applicable): None  Assessment:   No change needed at this time - patient already on a high intensity statin and is at goal - most recent LDL 25. Will defer any further changes at this point to avoid increasing regimen complexity.   Plan:    1.Statin intensity (high intensity recommended for all patients regardless of the LDL):  No statin changes. The patient is already on a high intensity statin.  2.Add ezetimibe (if any one of the following):   Not indicated at this time.  3.Refer to lipid clinic:   No  4.Follow-up with:  Primary care provider - Benita Stabile, MD  5.Follow-up labs after discharge:  No changes in lipid therapy, repeat a lipid panel in one year.      Blane Ohara, PharmD  PGY2 Pharmacy Resident

## 2022-10-16 NOTE — Evaluation (Signed)
Physical Therapy Evaluation Patient Details Name: Randy Jacobson MRN: [id number] DOB: 26-Apr-1945 Today's Date: 10/16/2022  History of Present Illness  Pt is a 77 y.o. male who presented 10/15/22 for L femoral-below knee popliteal artery bypass. Of note, pt recently admitted 10/11/22 - 10/12/22 for arteriography, angioplasty and stenting of the left common and external iliac artery secondary to a nonhealing wound of his left great toe and evidence of infrainguinal arterial occlusive disease. PMH: DM, HTN, peripheral artery disease   Clinical Impression  Pt presents with condition above and deficits mentioned below, see PT Problem List. PTA, he was independent without AD, living alone in a 1-level house with 5 STE. Currently, pt is demonstrating deficits in L lower extremity AROM, balance, and activity tolerance primarily due to his L lower extremity pain from the incisions from surgery. He required minA for transfers and min guard assist to ambulate up to ~80 ft with heavy reliance on the RW for pain management. Pt demonstrated and verbalized understanding of his exercises to improve his L lower extremity ROM and reduce stiffness/pain. Pt is motivated to improve and is anticipated to progress well as his pain improves. Thus, do not anticipate a need for follow-up PT at d/c. Will continue to follow acutely to maximize his return to baseline prior to d/c home.      Assistance Recommended at Discharge PRN  If plan is discharge home, recommend the following:  Can travel by private vehicle  A little help with walking and/or transfers;A little help with bathing/dressing/bathroom;Assistance with cooking/housework;Assist for transportation;Help with stairs or ramp for entrance        Equipment Recommendations Rolling walker (2 wheels)  Recommendations for Other Services       Functional Status Assessment Patient has had a recent decline in their functional status and demonstrates the ability to make  significant improvements in function in a reasonable and predictable amount of time.     Precautions / Restrictions Precautions Precautions: Fall Precaution Comments: wound vac L groin Restrictions Weight Bearing Restrictions: No      Mobility  Bed Mobility Overal bed mobility: Needs Assistance Bed Mobility: Supine to Sit     Supine to sit: Supervision     General bed mobility comments: Extra time due to L leg pain with pt utilizing the bed rails with HOB elevated to sit up L EOB, supervision for safety    Transfers Overall transfer level: Needs assistance Equipment used: Rolling walker (2 wheels) Transfers: Sit to/from Stand Sit to Stand: Min assist           General transfer comment: Pt placing L leg anterior to his R, needing cues to place 1 hand on bed to push up rather than pull up on the RW. MinA to power up to stand and gain his balance.    Ambulation/Gait Ambulation/Gait assistance: Min guard Gait Distance (Feet): 80 Feet Assistive device: Rolling walker (2 wheels) Gait Pattern/deviations: Step-to pattern, Decreased stance time - left, Decreased step length - right, Decreased stride length, Knee flexed in stance - left, Antalgic, Trunk flexed Gait velocity: reduced Gait velocity interpretation: <1.31 ft/sec, indicative of household ambulator   General Gait Details: Pt ambulates slowly with an antalgic step-to gait pattern with L knee slightly flexed and an inferior gaze with trunk flexed posture. Pt stops the RW every few steps. Cues provided for more continuous RW movement and for upright posture, mod success noted. No LOB, min guard for safety.  Stairs  Wheelchair Mobility     Tilt Bed    Modified Rankin (Stroke Patients Only)       Balance Overall balance assessment: Needs assistance Sitting-balance support: Feet supported, No upper extremity supported Sitting balance-Leahy Scale: Good     Standing balance support: Bilateral  upper extremity supported, During functional activity, Reliant on assistive device for balance Standing balance-Leahy Scale: Poor Standing balance comment: Reliant on RW at this time                             Pertinent Vitals/Pain Pain Assessment Pain Assessment: Faces Faces Pain Scale: Hurts even more Pain Location: L leg Pain Descriptors / Indicators: Other (Comment) ("stiff") Pain Intervention(s): Limited activity within patient's tolerance, Monitored during session, Repositioned    Home Living Family/patient expects to be discharged to:: Private residence Living Arrangements: Alone Available Help at Discharge: Available 24 hours/day;Family (girlfriend (unsure of amount of physical assist she can provide)) Type of Home: House Home Access: Stairs to enter Entrance Stairs-Rails: Can reach both Entrance Stairs-Number of Steps: 5   Home Layout: One level Home Equipment: None      Prior Function Prior Level of Function : Independent/Modified Independent;Driving             Mobility Comments: No AD, no falls 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, materials Dominance        Extremity/Trunk Assessment   Upper Extremity Assessment Upper Extremity Assessment: Defer to OT evaluation    Lower Extremity Assessment Lower Extremity Assessment: LLE deficits/detail LLE Deficits / Details: ankle dorsiflexion, knee flexion, and hip adduction AROM limited by pain but overall WFL; incision down medial aspect of leg; denied neuropathy    Cervical / Trunk Assessment Cervical / Trunk Assessment: Normal  Communication   Communication: No difficulties  Cognition Arousal/Alertness: Awake/alert Behavior During Therapy: Flat affect Overall Cognitive Status: Within Functional Limits for tasks assessed                                 General Comments: HOH impacting pt's speed with following cues        General Comments General comments (skin integrity,  edema, etc.): HR up to low 130s; educated pt on frequent mobility and AROM exercises to reduce stiffness in L leg, he verbalized and demonstrated understanding; educated pt on elevating L leg when at rest    Exercises General Exercises - Lower Extremity Ankle Circles/Pumps: AROM, Left, 10 reps, Supine Long Arc Quad: AROM, Left, 15 reps, Seated (x2 sets) Heel Slides: AAROM, AROM, Left, 10 reps, Supine (x5 AAROM then progressed to x5 AROM) Hip ABduction/ADduction: AROM, Left, 10 reps, Seated   Assessment/Plan    PT Assessment Patient needs continued PT services  PT Problem List Decreased range of motion;Decreased activity tolerance;Decreased balance;Decreased mobility;Pain;Decreased skin integrity       PT Treatment Interventions DME instruction;Gait training;Stair training;Functional mobility training;Therapeutic activities;Therapeutic exercise;Balance training;Neuromuscular re-education;Patient/family education    PT Goals (Current goals can be found in the Care Plan section)  Acute Rehab PT Goals Patient Stated Goal: to improve and go home PT Goal Formulation: With patient Time For Goal Achievement: 10/30/22 Potential to Achieve Goals: Good    Frequency Min 1X/week     Co-evaluation               AM-PAC PT "6 Clicks" Mobility  Outcome Measure Help  needed turning from your back to your side while in a flat bed without using bedrails?: A Little Help needed moving from lying on your back to sitting on the side of a flat bed without using bedrails?: A Little Help needed moving to and from a bed to a chair (including a wheelchair)?: A Little Help needed standing up from a chair using your arms (e.g., wheelchair or bedside chair)?: A Little Help needed to walk in hospital room?: A Little Help needed climbing 3-5 steps with a railing? : A Little 6 Click Score: 18    End of Session Equipment Utilized During Treatment: Gait belt Activity Tolerance: Patient tolerated treatment  well Patient left: in chair;with call bell/phone within reach;with chair alarm set Nurse Communication: Mobility status PT Visit Diagnosis: Unsteadiness on feet (R26.81);Other abnormalities of gait and mobility (R26.89);Difficulty in walking, not elsewhere classified (R26.2);Pain Pain - Right/Left: Left Pain - part of body: Leg    Time: 0454-0981 PT Time Calculation (min) (ACUTE ONLY): 33 min   Charges:   PT Evaluation $PT Eval Moderate Complexity: 1 Mod PT Treatments $Therapeutic Activity: 8-22 mins PT General Charges $$ ACUTE PT VISIT: 1 Visit         Raymond Gurney, PT, DPT Acute Rehabilitation Services  Office: [phone]   Jewel Baize 10/16/2022, 9:34 AM

## 2022-10-17 LAB — BASIC METABOLIC PANEL
Anion gap: 10 (ref 5–15)
BUN: 22 mg/dL (ref 8–23)
CO2: 25 mmol/L (ref 22–32)
Calcium: 8.3 mg/dL — ABNORMAL LOW (ref 8.9–10.3)
Chloride: 100 mmol/L (ref 98–111)
Creatinine, Ser: 1.07 mg/dL (ref 0.61–1.24)
GFR, Estimated: >60 mL/min (ref >60)
Glucose, Bld: 163 mg/dL — ABNORMAL HIGH (ref 70–99)
Potassium: 3.8 mmol/L (ref 3.5–5.1)
Sodium: 135 mmol/L (ref 135–145)

## 2022-10-17 LAB — CBC
HCT: 25.2 % — ABNORMAL LOW (ref 39.0–52.0)
Hemoglobin: 8.4 g/dL — ABNORMAL LOW (ref 13.0–17.0)
MCH: 29.7 pg (ref 26.0–34.0)
MCHC: 33.3 g/dL (ref 30.0–36.0)
MCV: 89 fL (ref 80.0–100.0)
Platelets: 204 10*3/uL (ref 150–400)
RBC: 2.83 MIL/uL — ABNORMAL LOW (ref 4.22–5.81)
RDW: 16.6 % — ABNORMAL HIGH (ref 11.5–15.5)
WBC: 18.4 10*3/uL — ABNORMAL HIGH (ref 4.0–10.5)
nRBC: 0.1 % (ref 0.0–0.2)

## 2022-10-17 LAB — GLUCOSE, CAPILLARY
Glucose-Capillary: 150 mg/dL — ABNORMAL HIGH (ref 70–99)
Glucose-Capillary: 266 mg/dL — ABNORMAL HIGH (ref 70–99)
Glucose-Capillary: 108 mg/dL — ABNORMAL HIGH (ref 70–99)
Glucose-Capillary: 171 mg/dL — ABNORMAL HIGH (ref 70–99)

## 2022-10-17 LAB — TYPE AND SCREEN

## 2022-10-17 LAB — BPAM RBC

## 2022-10-17 NOTE — Progress Notes (Signed)
Physical Therapy Treatment Patient Details Name: Randy Jacobson MRN: [id number] DOB: [birth year] Today's Date: 10/17/2022   History of Present Illness Pt is a 77 y.o. male who presented 10/15/22 for L femoral-below knee popliteal artery bypass. Of note, pt recently admitted 10/11/22 - 10/12/22 for arteriography, angioplasty and stenting of the left common and external iliac artery secondary to a nonhealing wound of his left great toe and evidence of infrainguinal arterial occlusive disease. PMH: DM, HTN, peripheral artery disease    PT Comments  The pt was able to complete stair training this session with good technique and safety. However, he continues to need repeated cues for hand placement and sequencing both with stair navigation and sit-stand transfers. He was more limited in ambulation distance at this time due to pain levels, but is still hopeful he can improve mobility to allow for return home. The pt did express concern regarding his elderly girlfriend's ability to provide physical assistance, will continue to benefit from skilled PT to improve independence with transfers prior to return home.      Assistance Recommended at Discharge PRN  If plan is discharge home, recommend the following:  Can travel by private vehicle    A little help with walking and/or transfers;A little help with bathing/dressing/bathroom;Assistance with cooking/housework;Assist for transportation;Help with stairs or ramp for entrance      Equipment Recommendations  Rolling walker (2 wheels)    Recommendations for Other Services       Precautions / Restrictions Precautions Precautions: Fall Precaution Comments: wound vac L groin Restrictions Weight Bearing Restrictions: No     Mobility  Bed Mobility Overal bed mobility: Needs Assistance Bed Mobility: Sit to Supine       Sit to supine: Min guard   General bed mobility comments: ming with HOB elevated, no assist to complete transition to sitting EOB     Transfers Overall transfer level: Needs assistance Equipment used: Rolling walker (2 wheels) Transfers: Sit to/from Stand Sit to Stand: Mod assist, Min guard, Min assist           General transfer comment: from modA from EOB to minG at times from recliner with BUE support on armrest. pt needs continued cues for hand positioning and increased time to rise    Ambulation/Gait Ambulation/Gait assistance: Min guard Gait Distance (Feet): 45 Feet Assistive device: Rolling walker (2 wheels) Gait Pattern/deviations: Step-to pattern, Decreased stance time - left, Decreased step length - right, Decreased stride length, Knee flexed in stance - left, Antalgic, Trunk flexed Gait velocity: reduced Gait velocity interpretation: <1.31 ft/sec, indicative of household ambulator   General Gait Details: slow with step-to pattern, at times placing LLE outside of RW and benefits from cues to reposition. cues for posture and trunk position as well. pt picking up RW at times to avoid hallway obstacles rather than rolling around.   Stairs Stairs: Yes Stairs assistance: Min assist Stair Management: One rail Left, Step to pattern, Sideways Number of Stairs: 5 General stair comments: pt completing with BUE support on L rail and step-to pattern. needed repeated cues for RLE leading ascending and LLE leading descending         Balance Overall balance assessment: Needs assistance Sitting-balance support: Feet supported, No upper extremity supported Sitting balance-Leahy Scale: Good     Standing balance support: Bilateral upper extremity supported, During functional activity, Reliant on assistive device for balance Standing balance-Leahy Scale: Poor Standing balance comment: cannot release walker in static standing  Cognition Arousal/Alertness: Awake/alert Behavior During Therapy: Flat affect Overall Cognitive Status: Difficult to assess                                  General Comments: pt with slightly improved insight to need for assist and girlfriend's inability to physically assist. does need cues for safety and technique repeated        Exercises      General Comments General comments (skin integrity, edema, etc.): HR 124 with stair navigation, SpO2 stable on RA      Pertinent Vitals/Pain Pain Assessment Pain Assessment: Faces Faces Pain Scale: Hurts even more Pain Location: L leg Pain Descriptors / Indicators: Discomfort, Grimacing, Guarding, Sore Pain Intervention(s): Limited activity within patient's tolerance, Monitored during session, Repositioned    Home Living Family/patient expects to be discharged to:: Private residence Living Arrangements: Alone Available Help at Discharge: Friend(s);Available PRN/intermittently (girlfriend) Type of Home: House Home Access: Stairs to enter Entrance Stairs-Rails: Can reach both Entrance Stairs-Number of Steps: 5   Home Layout: One level Home Equipment: None      Prior Function            PT Goals (current goals can now be found in the care plan section) Acute Rehab PT Goals Patient Stated Goal: to continue improving before returning home PT Goal Formulation: With patient Time For Goal Achievement: 10/30/22 Potential to Achieve Goals: Good Progress towards PT goals: Progressing toward goals    Frequency    Min 1X/week      PT Plan Current plan remains appropriate       AM-PAC PT "6 Clicks" Mobility   Outcome Measure  Help needed turning from your back to your side while in a flat bed without using bedrails?: A Little Help needed moving from lying on your back to sitting on the side of a flat bed without using bedrails?: A Little Help needed moving to and from a bed to a chair (including a wheelchair)?: A Little Help needed standing up from a chair using your arms (e.g., wheelchair or bedside chair)?: A Little Help needed to walk in hospital room?:  A Little Help needed climbing 3-5 steps with a railing? : A Little 6 Click Score: 18    End of Session Equipment Utilized During Treatment: Gait belt Activity Tolerance: Patient tolerated treatment well Patient left: in chair;with call bell/phone within reach;with chair alarm set Nurse Communication: Mobility status PT Visit Diagnosis: Unsteadiness on feet (R26.81);Other abnormalities of gait and mobility (R26.89);Difficulty in walking, not elsewhere classified (R26.2);Pain Pain - Right/Left: Left Pain - part of body: Leg     Time: 9604-5409 PT Time Calculation (min) (ACUTE ONLY): 35 min  Charges:    $Gait Training: 8-22 mins $Therapeutic Exercise: 8-22 mins PT General Charges $$ ACUTE PT VISIT: 1 Visit                     Vickki Muff, PT, DPT   Acute Rehabilitation Department Office [phone] Secure Chat Communication Preferred   Ronnie Derby 10/17/2022, 11:18 AM

## 2022-10-17 NOTE — Progress Notes (Addendum)
  Progress Note    10/17/2022 7:55 AM 2 Days Post-Op  Subjective:  feeling a little bit stronger today, not quite ready to go home    Vitals:   10/16/22 2000 10/17/22 0748  BP: (!) 150/57 (!) 134/59  Pulse:  98  Resp: 18 20  Temp: 98.6 F (37 C) 98.7 F (37.1 C)  SpO2: 95% 96%    Physical Exam: General:  no acute distress Cardiac:  regular Lungs:  nonlabored Incisions:  L groin incision intact with prevena. LLE incisions intact and dry Extremities:  brisk L AT doppler signal   CBC    Component Value Date/Time   WBC 18.4 (H) 10/17/2022 0117   RBC 2.83 (L) 10/17/2022 0117   HGB 8.4 (L) 10/17/2022 0117   HGB 9.4 (L) 05/21/2019 1205   HCT 25.2 (L) 10/17/2022 0117   HCT 29.3 (L) 05/21/2019 1205   PLT 204 10/17/2022 0117   PLT 245 05/21/2019 1205   MCV 89.0 10/17/2022 0117   MCV 73 (L) 05/21/2019 1205   MCH 29.7 10/17/2022 0117   MCHC 33.3 10/17/2022 0117   RDW 16.6 (H) 10/17/2022 0117   RDW 15.7 (H) 05/21/2019 1205   LYMPHSABS 1.2 10/16/2022 1615   LYMPHSABS 0.5 (L) 05/21/2019 1205   MONOABS 3.5 (H) 10/16/2022 1615   EOSABS 0.1 10/16/2022 1615   EOSABS 0.0 05/21/2019 1205   BASOSABS 0.0 10/16/2022 1615   BASOSABS 0.1 05/21/2019 1205    BMET    Component Value Date/Time   NA 135 10/17/2022 0117   NA 132 (L) 05/21/2019 1205   K 3.8 10/17/2022 0117   CL 100 10/17/2022 0117   CO2 25 10/17/2022 0117   GLUCOSE 163 (H) 10/17/2022 0117   BUN 22 10/17/2022 0117   BUN 18 05/21/2019 1205   CREATININE 1.07 10/17/2022 0117   CALCIUM 8.3 (L) 10/17/2022 0117   GFRNONAA >60 10/17/2022 0117   GFRAA 94 05/21/2019 1205    INR    Component Value Date/Time   INR 1.1 10/15/2022 0634     Intake/Output Summary (Last 24 hours) at 10/17/2022 0755 Last data filed at 10/16/2022 2100 Gross per 24 hour  Intake 557.5 ml  Output 500 ml  Net 57.5 ml      Assessment/Plan:  77 y.o. male is 2 days post op, s/p: left iliofemoral endarterectomy with left femoral to BK pop  bypass with vein   -Feeling a little stronger today. Left leg is still sore but tolerable -L groin incision intact with prevena. LLE incisions intact and dry -LLE warm with brisk L AT doppler signal -Hgb improved this morning at 8.4. Will recheck tomorrow morning -Mobilizing with PT/OT. They have recommended RW at discharge for walking around. Patient may feel strong enough tomorrow for discharge -Continue ASA, plavix, and statin   Loel Dubonnet, [place] [facility] [phone] 10/17/2022 7:55 AM   I have independently interviewed and examined patient and agree with PA assessment and plan above.  Continue with PT.  Plan is for discharge home with rolling walker and he is planning to go to his girlfriend's house where there are no steps.  Brylee Berk C. Randie Heinz, MD [facility] [place] Office: [phone] Pager: [phone]

## 2022-10-17 NOTE — TOC Initial Note (Signed)
Transition of Care [facility]) - Initial/Assessment Note    Patient Details  Name: Randy Jacobson MRN: [id number] Date of Birth: 01-Apr-1946  [facility] CM/SW Contact:    Ronny Bacon, RN Phone Number: 10/17/2022, 2:47 PM  Clinical Narrative:   Spoke with patient by phone. Patient lives alone. Patient will make transportation arrangements once he is informed that he is discharged home. RW ordered through [employer], patient aware.                       Patient Goals and CMS Choice            Expected Discharge Plan and Services                         DME Arranged: Walker rolling DME Agency: [employer] Date DME Agency Contacted: 10/17/22 Time DME Agency Contacted: [phone] Representative spoke with at DME Agency: Leavy Cella            Prior Living Arrangements/Services                       Activities of Daily Living Home Assistive Devices/Equipment: None ADL Screening (condition at time of admission) Patient's cognitive ability adequate to safely complete daily activities?: Yes Is the patient deaf or have difficulty hearing?: No Does the patient have difficulty seeing, even when wearing glasses/contacts?: No Does the patient have difficulty concentrating, remembering, or making decisions?: No Patient able to express need for assistance with ADLs?: Yes Does the patient have difficulty dressing or bathing?: No Independently performs ADLs?: Yes (appropriate for developmental age) Does the patient have difficulty walking or climbing stairs?: No Weakness of Legs: None Weakness of Arms/Hands: None  Permission Sought/Granted                  Emotional Assessment              Admission diagnosis:  PAD (peripheral artery disease) (HCC) [I73.9] Patient Active Problem List   Diagnosis Date Noted   Gastroduodenitis 09/01/2021   History of adenomatous polyp of colon 09/01/2021   History of anemia 09/01/2021   Iron deficiency anemia due to  chronic blood loss 09/01/2021   Cough 01/13/2021   History of peptic ulcer 09/04/2020   Insomnia 09/04/2020   Iron deficiency 09/04/2020   Proteinuria 09/04/2020   White blood cell abnormality 09/04/2020   Hyperglycemia due to type 2 diabetes mellitus (HCC) 09/01/2020   GI bleeding 04/08/2017   Diabetes mellitus, type 2 (HCC) 12/16/2015   Sepsis (HCC) 12/16/2015   Hyperlipidemia 12/16/2015   Hypertension 12/16/2015   Diarrhea    Febrile illness    Enteric campylobacteriosis    Claudication of right lower extremity (HCC)    PAD (peripheral artery disease) (HCC)    PCP:  Benita Stabile, MD Pharmacy:   [facility] [address], [place] - [address] [place] [address] Phone: [phone] Fax: [phone]     Social Determinants of Health (SDOH) Social History: SDOH Screenings   Food Insecurity: No Food Insecurity (10/16/2022)  Housing: Low Risk  (10/16/2022)  Transportation Needs: No Transportation Needs (10/16/2022)  Utilities: Not At Risk (10/16/2022)  Tobacco Use: Medium Risk (10/16/2022)   SDOH Interventions:     Readmission Risk Interventions     No data to display

## 2022-10-18 ENCOUNTER — Encounter: Payer: Medicare Other | Admitting: Surgery

## 2022-10-18 ENCOUNTER — Encounter (HOSPITAL_COMMUNITY): Payer: Medicare Other

## 2022-10-18 LAB — CBC
HCT: 21.8 % — ABNORMAL LOW (ref 39.0–52.0)
Hemoglobin: 7.3 g/dL — ABNORMAL LOW (ref 13.0–17.0)
MCH: 30.2 pg (ref 26.0–34.0)
MCHC: 33.5 g/dL (ref 30.0–36.0)
MCV: 90.1 fL (ref 80.0–100.0)
Platelets: 175 10*3/uL (ref 150–400)
RBC: 2.42 MIL/uL — ABNORMAL LOW (ref 4.22–5.81)
RDW: 15.8 % — ABNORMAL HIGH (ref 11.5–15.5)
WBC: 12.8 10*3/uL — ABNORMAL HIGH (ref 4.0–10.5)
nRBC: 0 % (ref 0.0–0.2)

## 2022-10-18 LAB — BPAM RBC
Blood Product Expiration Date: 202407092359
Blood Product Expiration Date: 202407112359
Blood Product Expiration Date: 202407142359
ISSUE DATE / TIME: 202407080857
Unit Type and Rh: 9500

## 2022-10-18 LAB — PREPARE RBC (CROSSMATCH)

## 2022-10-18 LAB — HEMOGLOBIN AND HEMATOCRIT, BLOOD
HCT: 26.1 % — ABNORMAL LOW (ref 39.0–52.0)
Hemoglobin: 8.6 g/dL — ABNORMAL LOW (ref 13.0–17.0)

## 2022-10-18 LAB — TYPE AND SCREEN: Unit division: 0

## 2022-10-18 LAB — GLUCOSE, CAPILLARY
Glucose-Capillary: 168 mg/dL — ABNORMAL HIGH (ref 70–99)
Glucose-Capillary: 190 mg/dL — ABNORMAL HIGH (ref 70–99)

## 2022-10-18 LAB — PATHOLOGIST SMEAR REVIEW

## 2022-10-18 MED ORDER — ADULT MULTIVITAMIN W/MINERALS CH
1.0000 | ORAL_TABLET | Freq: Every day | ORAL | Status: DC
  Administered 2022-10-18: 1 via ORAL
  Filled 2022-10-18: qty 1

## 2022-10-18 MED ORDER — ASPIRIN 81 MG PO TBEC: 81.0000 mg | DELAYED_RELEASE_TABLET | Freq: Every day | ORAL | 12 refills | Status: AC

## 2022-10-18 MED ORDER — OXYCODONE HCL 5 MG PO TABS: 5.0000 mg | ORAL_TABLET | Freq: Four times a day (QID) | ORAL | 0 refills | Status: DC | PRN

## 2022-10-18 MED ORDER — SODIUM CHLORIDE 0.9% IV SOLUTION: Freq: Once | INTRAVENOUS | Status: AC

## 2022-10-18 NOTE — Care Management Important Message (Signed)
Important Message  Patient Details  Name: Randy Jacobson MRN: [id number] Date of Birth: 10-Jan-1946   Medicare Important Message Given:  Yes     Renie Ora 10/18/2022, 11:41 AM

## 2022-10-18 NOTE — Discharge Instructions (Signed)
[facility]  Discharge instructions  Lower Extremity Bypass Surgery  Please refer to the following instruction for your post-procedure care. Your surgeon or physician assistant will discuss any changes with you.  Activity  You are encouraged to walk as much as you can. You can slowly return to normal activities during the month after your surgery. Avoid strenuous activity and heavy lifting until your doctor tells you it's OK. Avoid activities such as vacuuming or swinging a golf club. Do not drive until your doctor give the OK and you are no longer taking prescription pain medications. It is also normal to have difficulty with sleep habits, eating and bowel movement after surgery. These will go away with time.  Bathing/Showering  Shower daily after you go home. Do not soak in a bathtub, hot tub, or swim until the incision heals completely.  Incision Care  Clean your incision with mild soap and water. Shower every day. Pat the area dry with a clean towel. You do not need a bandage unless otherwise instructed. Do not apply any ointments or creams to your incision. If you have open wounds you will be instructed how to care for them or a visiting nurse may be arranged for you. If you have staples or sutures along your incision they will be removed at your post-op appointment. You may have skin glue on your incision. Do not peel it off. It will come off on its own in about one week.  Wash the groin wound with soap and water daily and pat dry. (No tub bath-only shower)  Then put a dry gauze or washcloth in the groin to keep this area dry to help prevent wound infection.  Do this daily and as needed.  Do not use Vaseline or neosporin on your incisions.  Only use soap and water on your incisions and then protect and keep dry.  Diet  Resume your normal diet. There are no special food restrictions following this procedure. A low fat/ low cholesterol diet is  recommended for all patients with vascular disease. In order to heal from your surgery, it is CRITICAL to get adequate nutrition. Your body requires vitamins, minerals, and protein. Vegetables are the best source of vitamins and minerals. Vegetables also provide the perfect balance of protein. Processed food has little nutritional value, so try to avoid this.  Medications  Resume taking all your medications unless your doctor or physician assistant tells you not to. If your incision is causing pain, you may take over-the-counter pain relievers such as acetaminophen (Tylenol). If you were prescribed a stronger pain medication, please aware these medication can cause nausea and constipation. Prevent nausea by taking the medication with a snack or meal. Avoid constipation by drinking plenty of fluids and eating foods with high amount of fiber, such as fruits, vegetables, and grains. Take Colace 100 mg (an over-the-counter stool softener) twice a day as needed for constipation.  Do not take Tylenol if you are taking prescription pain medications.  Follow Up  Our office will schedule a follow up appointment 2-3 weeks following discharge.  Please call us immediately for any of the following conditions  Severe or worsening pain in your legs or feet while at rest or while walking  Increased pain, redness, warmth, or drainage (pus) from your incision site(s) Fever of 101 degree or higher The swelling in your leg with the bypass suddenly worsens and becomes more painful than when you were in the hospital If you  have been instructed to feel your graft pulse then you should do so every day. If you can no longer feel this pulse, call the office immediately. Not all patients are given this instruction.  Leg swelling is common after leg bypass surgery.  The swelling should improve over a few months following surgery. To improve the swelling, you may elevate your legs above the level of your heart while you are  sitting or resting. Your surgeon or physician assistant may ask you to apply an ACE wrap or wear compression (TED) stockings to help to reduce swelling.  Reduce your risk of vascular disease  Stop smoking. If you would like help call [facility] at 1-800-QUIT-NOW (762-046-8061) or [place] at [phone].  Manage your cholesterol Maintain a desired weight Control your diabetes weight Control your diabetes Keep your blood pressure down  If you have any questions, please call the office at [phone]    Keep Pravena wound vac on your groin incision until it loses it seal in about 7-10 days.  Once that happens, you can remove and then wash the groin wound with soap and water daily and pat dry. (No tub bath-only shower)  Then put a dry gauze or washcloth in the groin to keep this area dry to help prevent wound infection.  Do this daily and as needed.  Do not use Vaseline or neosporin on your incisions.  Only use soap and water on your incisions and then protect and keep dry.

## 2022-10-18 NOTE — Progress Notes (Signed)
Mobility Specialist Progress Note:   10/18/22 1250  Mobility  Activity Ambulated with assistance in room  Level of Assistance Contact guard assist, steadying assist  Assistive Device Front wheel walker  Distance Ambulated (ft) 30 ft  Activity Response Tolerated well  Mobility Referral Yes  $Mobility charge 1 Mobility  Mobility Specialist Start Time (ACUTE ONLY) 1230  Mobility Specialist Stop Time (ACUTE ONLY) 1245  Mobility Specialist Time Calculation (min) (ACUTE ONLY) 15 min    During Mobility: 109 HR , BP  Post Mobility: 101 HR, 97% SpO2   Pt received in bed agreeable to mobility. MinA to stand. CG during ambulation. C/o feeling weak  during ambulation in result only walked in room, otherwise asymptomatic. Pt left in bed with call bell near and all needs met.   Leory Plowman  Mobility Specialist Please contact via [employer] office at [phone]

## 2022-10-18 NOTE — Progress Notes (Signed)
Occupational Therapy Treatment Patient Details Name: Randy Jacobson MRN: [id number] DOB: March 31, 1946 Today's Date: 10/18/2022   History of present illness Pt is a 77 y.o. male who presented 10/15/22 for L femoral-below knee popliteal artery bypass. Of note, pt recently admitted 10/11/22 - 10/12/22 for arteriography, angioplasty and stenting of the left common and external iliac artery secondary to a nonhealing wound of his left great toe and evidence of infrainguinal arterial occlusive disease. PMH: DM, HTN, peripheral artery disease   OT comments  Pt plans to discharge to his girlfriend's home which is barrier free with walk in shower and tall toilet. Pt wanting to practice toilet transfers, needing light min assist and grab bar to stand. Declined riser or 3 in 1, but educated in availability. Reinforced use of AE for LB ADLs and recommended seated showering. Pt verbalized understanding. Pt able to release walker at sink in static standing with improved L LE pain.    Recommendations for follow up therapy are one component of a multi-disciplinary discharge planning process, led by the attending physician.  Recommendations may be updated based on patient status, additional functional criteria and insurance authorization.    Assistance Recommended at Discharge Intermittent Supervision/Assistance  Patient can return home with the following  A little help with walking and/or transfers;A lot of help with bathing/dressing/bathroom;Assistance with cooking/housework;Help with stairs or ramp for entrance   Equipment Recommendations  None recommended by OT    Recommendations for Other Services      Precautions / Restrictions Precautions Precautions: Fall Precaution Comments: wound vac L groin Restrictions Weight Bearing Restrictions: No       Mobility Bed Mobility Overal bed mobility: Needs Assistance Bed Mobility: Supine to Sit, Sit to Supine     Supine to sit: Supervision Sit to supine: Min  guard        Transfers Overall transfer level: Needs assistance Equipment used: Rolling walker (2 wheels) Transfers: Sit to/from Stand Sit to Stand: Min assist                 Balance   Sitting-balance support: Feet supported, No upper extremity supported Sitting balance-Leahy Scale: Good       Standing balance-Leahy Scale: Fair Standing balance comment: able to release walker at sink to wash hands                           ADL either performed or assessed with clinical judgement   ADL Overall ADL's : Needs assistance/impaired     Grooming: Wash/dry hands;Standing;Min guard       Lower Body Bathing: Sit to/from stand;Moderate assistance Lower Body Bathing Details (indicate cue type and reason): reinforced use of his long bath sponge     Lower Body Dressing: Maximal assistance;Sit to/from stand Lower Body Dressing Details (indicate cue type and reason): will rely on girlfriend, reinforced use of his reacher Toilet Transfer: Minimal assistance;Comfort height toilet;Grab bars;Rolling walker (2 wheels)           Functional mobility during ADLs: Min guard;Rolling walker (2 wheels) General ADL Comments: continues to decline 3 in 1, states the toilet he is using is taller than hospital's    Extremity/Trunk Assessment              Vision       Perception     Praxis      Cognition Arousal/Alertness: Awake/alert Behavior During Therapy: WFL for tasks assessed/performed Overall Cognitive Status: Within Functional Limits for tasks  assessed                                 General Comments: pt with improved insight, wanted to practice getting up from toilet, plans to go home with girlfriend        Exercises      Shoulder Instructions       General Comments      Pertinent Vitals/ Pain       Pain Assessment Pain Assessment: Faces Faces Pain Scale: Hurts little more Pain Location: L leg Pain Descriptors / Indicators:  Discomfort, Grimacing, Guarding, Sore Pain Intervention(s): Monitored during session, Repositioned  Home Living                                          Prior Functioning/Environment              Frequency  Min 1X/week        Progress Toward Goals  OT Goals(current goals can now be found in the care plan section)  Progress towards OT goals: Progressing toward goals  Acute Rehab OT Goals OT Goal Formulation: With patient Time For Goal Achievement: 10/30/22 Potential to Achieve Goals: Good  Plan Discharge plan remains appropriate    Co-evaluation                 AM-PAC OT "6 Clicks" Daily Activity     Outcome Measure   Help from another person eating meals?: None Help from another person taking care of personal grooming?: A Little Help from another person toileting, which includes using toliet, bedpan, or urinal?: A Little Help from another person bathing (including washing, rinsing, drying)?: A Lot Help from another person to put on and taking off regular upper body clothing?: A Little Help from another person to put on and taking off regular lower body clothing?: A Lot 6 Click Score: 17    End of Session Equipment Utilized During Treatment: Gait belt;Rolling walker (2 wheels)  OT Visit Diagnosis: Unsteadiness on feet (R26.81);Other abnormalities of gait and mobility (R26.89);Pain;Muscle weakness (generalized) (M62.81);Other symptoms and signs involving cognitive function Pain - Right/Left: Left Pain - part of body: Leg   Activity Tolerance Patient tolerated treatment well   Patient Left in bed;with call bell/phone within reach   Nurse Communication          Time: 7846-9629 OT Time Calculation (min): 22 min  Charges: OT General Charges $OT Visit: 1 Visit OT Treatments $Self Care/Home Management : 8-22 mins  Berna Spare, OTR/L Acute Rehabilitation Services Office: [phone]   Evern Bio 10/18/2022, 1:18 PM

## 2022-10-18 NOTE — Progress Notes (Signed)
CCMD called - tele removed. Provena instructions reviewed w/ wife & patient. Pt dressed. Discharged home with his wife. All belonging returned to patient. No other questions at this time

## 2022-10-18 NOTE — Anesthesia Postprocedure Evaluation (Signed)
Anesthesia Post Note  Patient: Randy Jacobson  Procedure(s) Performed: LEFT FEMORAL-BELOW KNEE POPLITEAL ARTERY BYPASS WITH VEIN HARVEST OF LEFT GREATER SAPHENOUS VEIN (Left: Leg Upper) PATCH ANGIOPLASTY USING XENOSURE BIOLOGIC PATCH 1CMX14CM (Left: Groin) ENDARTERECTOMY OF LEFT ILIOFEMORAL ARTERY (Left: Groin)     Patient location during evaluation: PACU Anesthesia Type: General Level of consciousness: awake and alert Pain management: pain level controlled Vital Signs Assessment: post-procedure vital signs reviewed and stable Respiratory status: spontaneous breathing, nonlabored ventilation, respiratory function stable and patient connected to nasal cannula oxygen Cardiovascular status: blood pressure returned to baseline and stable Postop Assessment: no apparent nausea or vomiting Anesthetic complications: no   No notable events documented.               Shelton Silvas

## 2022-10-18 NOTE — Consult Note (Signed)
   [facility] Inpatient Consult   10/18/2022  GARNET ZAMORSKI [birth year] [id number]  [facility] [[facility]]  Accountable Care Organization [ACO] Patient: [employer] Medicare  Primary Care Provider:  Benita Stabile, MD  Patient screened for less than 7 days readmission hospitalization with noted low risk score for unplanned readmission risk 3 day length of stay and to assess for potential [facility]  [[facility]] Care Management service needs for post hospital transition for care coordination.  Review of patient's electronic medical record reveals patient is for home today. Met with patient [in and out of sleeping] and Joann, SO regarding post hospital needs. No needs verbalized as he will be staying with [facility] for recovery. Explained how any card coordination could be assisted.  They endorse PCP and plan to follow up. Phone numbers verified.  Plan:  Continue to follow progress and disposition to assess for post hospital community care coordination/management needs.  Referral request for community care coordination: None needed, gave them the appointment reminder card with a 24 hour nurse advise line number.   Of note, [facility] Care Management/Population Health does not replace or interfere with any arrangements made by the Inpatient Transition of Care team.  For questions contact:   Charlesetta Shanks, RN BSN CCM [facility]  [phone] business mobile phone Toll free office [phone]  *Concierge Line  [phone] Fax number: [phone] [email or web address]

## 2022-10-18 NOTE — TOC Transition Note (Signed)
Transition of Care (TOC) - CM/SW Discharge Note Donn Pierini RN, BSN Transitions of Care Unit 4E- RN Case Manager See Treatment Team for direct phone #   Patient Details  Name: Randy Jacobson MRN: [id number] Date of Birth: 1945-06-01  Transition of Care [facility]) CM/SW Contact:  Darrold Span, RN Phone Number: 10/18/2022, 12:30 PM   Clinical Narrative:    Pt stable for transition home today, Per previous CM note- DME has been arranged w/ [employer] and has been delivered to the room for home.   No further TOC needs noted. Pt has transportation home.    Final next level of care: Home/Self Care Barriers to Discharge: No Barriers Identified   Patient Goals and CMS Choice  Return home    Discharge Placement              Home           Discharge Plan and Services Additional resources added to the After Visit Summary for     Discharge Planning Services: CM Consult Post Acute Care Choice: Durable Medical Equipment          DME Arranged: Walker rolling DME Agency: [employer] Date DME Agency Contacted: 10/17/22 Time DME Agency Contacted: [phone] Representative spoke with at DME Agency: Jasmine HH Arranged: NA HH Agency: NA        Social Determinants of Health (SDOH) Interventions SDOH Screenings   Food Insecurity: No Food Insecurity (10/16/2022)  Housing: Low Risk  (10/16/2022)  Transportation Needs: No Transportation Needs (10/16/2022)  Utilities: Not At Risk (10/16/2022)  Tobacco Use: Medium Risk (10/16/2022)     Readmission Risk Interventions    10/18/2022   12:30 PM  Readmission Risk Prevention Plan  Post Dischage Appt Complete  Medication Screening Complete  Transportation Screening Complete

## 2022-10-18 NOTE — Progress Notes (Addendum)
  Progress Note    10/18/2022 7:19 AM 3 Days Post-Op  Subjective:  feels fine, wants to go home     Vitals:   10/18/22 0020 10/18/22 0500  BP: (!) 139/56 (!) 139/59  Pulse: 96 98  Resp: 17 12  Temp: 98 F (36.7 C) 98.1 F (36.7 C)  SpO2: 96% 96%    Physical Exam: General:  resting comfortably Cardiac:  regular Lungs:  nonlabored Incisions:  L groin intact with prevena. LLE incisions intact. Some bruising of left upper thigh incisions but this area is soft Extremities:  brisk L AT doppler signal   CBC    Component Value Date/Time   WBC 12.8 (H) 10/18/2022 0133   RBC 2.42 (L) 10/18/2022 0133   HGB 7.3 (L) 10/18/2022 0133   HGB 9.4 (L) 05/21/2019 1205   HCT 21.8 (L) 10/18/2022 0133   HCT 29.3 (L) 05/21/2019 1205   PLT 175 10/18/2022 0133   PLT 245 05/21/2019 1205   MCV 90.1 10/18/2022 0133   MCV 73 (L) 05/21/2019 1205   MCH 30.2 10/18/2022 0133   MCHC 33.5 10/18/2022 0133   RDW 15.8 (H) 10/18/2022 0133   RDW 15.7 (H) 05/21/2019 1205   LYMPHSABS 1.2 10/16/2022 1615   LYMPHSABS 0.5 (L) 05/21/2019 1205   MONOABS 3.5 (H) 10/16/2022 1615   EOSABS 0.1 10/16/2022 1615   EOSABS 0.0 05/21/2019 1205   BASOSABS 0.0 10/16/2022 1615   BASOSABS 0.1 05/21/2019 1205    BMET    Component Value Date/Time   NA 135 10/17/2022 0117   NA 132 (L) 05/21/2019 1205   K 3.8 10/17/2022 0117   CL 100 10/17/2022 0117   CO2 25 10/17/2022 0117   GLUCOSE 163 (H) 10/17/2022 0117   BUN 22 10/17/2022 0117   BUN 18 05/21/2019 1205   CREATININE 1.07 10/17/2022 0117   CALCIUM 8.3 (L) 10/17/2022 0117   GFRNONAA >60 10/17/2022 0117   GFRAA 94 05/21/2019 1205    INR    Component Value Date/Time   INR 1.1 10/15/2022 0634     Intake/Output Summary (Last 24 hours) at 10/18/2022 0719 Last data filed at 10/18/2022 0500 Gross per 24 hour  Intake --  Output 1600 ml  Net -1600 ml      Assessment/Plan:  77 y.o. male is 3 days post op, s/p: left iliofemoral endarterectomy with left  femoral to BK pop bypass with vein    -L groin with prevena, intact seal. Minimal output in canister. LLE incisions intact with mild bruising of upper thigh. Bruising is soft -LLE warm with brisk AT doppler signal -Hgb back down to 7.3 today. No signs of active bleeding on exam. Chronically anemic at baseline. Will give 1 unit PRBCs this morning and recheck Hgb -Mobilizing well with PT/OT. Will have rolling walker for discharge -If Hgb is improved this afternoon after transfusion, will d/c patient home. He plans to go to his girlfriends house at discharge  -Will arrange follow up with our office in 2-3 weeks for incision check   Loel Dubonnet, PA-C [facility] [phone] 10/18/2022 7:19 AM  I have independently interviewed and examined patient and agree with PA assessment and plan above.   Roald Lukacs C. Randie Heinz, MD [facility] [place] Office: [phone] Pager: [phone]

## 2022-10-19 ENCOUNTER — Other Ambulatory Visit: Payer: Self-pay | Admitting: Vascular Surgery

## 2022-10-19 LAB — TYPE AND SCREEN
ABO/RH(D): O NEG
Antibody Screen: NEGATIVE
Unit division: 0

## 2022-10-19 LAB — BPAM RBC
ISSUE DATE / TIME: 202407060508
ISSUE DATE / TIME: 202407061245
Unit Type and Rh: 9500

## 2022-10-19 MED FILL — Atropine Sulfate Soln Prefill Syr 1 MG/10ML (0.1 MG/ML): INTRAMUSCULAR | Qty: 10 | Status: AC

## 2022-10-21 ENCOUNTER — Telehealth: Payer: Self-pay

## 2022-10-21 NOTE — Telephone Encounter (Signed)
Pt called stating that the wrap came off his leg and his wound vac has turned off.  Reviewed pt's chart, returned call for clarification, two identifiers used. Pt did not have charging cord, so the Prevena vac has stopped working. Since he has no way of turning it back on, instructed him on how to remove it, wash the incisional area, and cover with clean gauze. Instructed pt to re-wrap leg with compression wrap to keep swelling minimal. Confirmed understanding.

## 2022-10-22 ENCOUNTER — Telehealth: Payer: Self-pay

## 2022-10-22 NOTE — Telephone Encounter (Signed)
Pt removed compression bandage and noticed some swelling. He was advised yesterday by triage nurse to continue wrapping the leg but he decided he would leave this off, as he stated he wanted to air it/let it breath. Pt has been advised to continue with compression to help with swelling. He has no other questions/concerns, feels he is healing well and will call us if anything changes.

## 2022-10-24 NOTE — Discharge Summary (Signed)
Bypass Discharge Summary Patient ID: Randy Jacobson [id number] 76 y.o. Aug 15, 1945  Admit date: 10/15/2022  Discharge date and time: 10/18/2022  4:14 PM   Admitting Physician: Chuck Hint, MD   Discharge Physician: Chuck Hint, MD   Admission Diagnoses: PAD (peripheral artery disease) [facility]) [I73.9]  Discharge Diagnoses: PAD (peripheral artery disease) (H[facility]) [I73.9]  Admission Condition: fair  Discharged Condition: fair  Indication for Admission: Randy Jacobson is a 77 y.o. male who presented with a nonhealing wound of his left great toe and multilevel arterial occlusive disease.  He underwent an arteriogram and had a stenosis of the left external iliac artery which was successfully addressed with angioplasty and stenting.  He presents now for iliofemoral endarterectomy and fem pop bypass grafting.   Hospital Course: The patient was admitted to the hospital on 10/15/2022 and underwent the following: 1) left iliofemoral endarterectomy, 2) left femoral to below-knee popliteal artery bypass with nonreversed translocated saphenous vein.  He tolerated the procedure well was transferred to the PACU in stable condition.  POD 1 his pain was well-controlled and he was tolerating a normal diet.  He had acute surgical blood loss anemia with a hemoglobin of 6.1, so he was given 2 units PRBCs.  He was asymptomatic and hemodynamically stable.  His repeat hemoglobin after transfusion was 8.5.  POD 2 he was mobilizing well with PT/OT.  They recommended no needed home health therapy.  His hemoglobin was stable at 8.4.  He felt he needed 1 more day to get stronger before discharging home.   POD 3 he continued to increase in mobilization.  His hemoglobin decreased again to 7.3.  He does have a history of chronic anemia requiring iron supplements at home.  Again he was asymptomatic and hemodynamically stable.  He was given 1 unit PRBCs and repeat hemoglobin increased to 8.6.  He was considered  stable and was discharged home.  Consults: None  Treatments: IV hydration, antibiotics: Ancef, analgesia: oxycodone, anticoagulation: ASA and heparin, therapies: PT, OT, and RN, and surgery: 1) left iliofemoral endarterectomy, 2) left femoral to below-knee popliteal artery bypass with nonreversed translocated saphenous vein    Disposition: Discharge disposition: 01-Home or Self Care       - For [facility] Registry use ---  Post-op:  Wound infection: No  Graft infection: No  Transfusion: Yes  If yes, 3 units given New Arrhythmia: No Patency judged by: [ x] Doppler only, [ ]  Palpable graft pulse, [ ]  Palpable distal pulse, [ ]  ABI inc. > 0.15, [ ]  Duplex D/C Ambulatory Status: Ambulatory  Complications: MI: [ x] No, [ ]  Troponin only, [ ]  EKG or Clinical CHF: No Resp failure: [x ] none, [ ]  Pneumonia, [ ]  Ventilator Chg in renal function: [x ] none, [ ]  Inc. Cr > 0.5, [ ]  Temp. Dialysis, [ ]  Permanent dialysis Stroke: [ x] None, [ ]  Minor, [ ]  Major Return to OR: No  Reason for return to OR: [ ]  Bleeding, [ ]  Infection, [ ]  Thrombosis, [ ]  Revision  Discharge medications: Statin use:  Yes ASA use:  Yes Plavix use:  Yes Beta blocker use: No  for medical reason not medically necessary Coumadin use: No  for medical reason not medically necessary    Patient Instructions:  Allergies as of 10/18/2022   No Known Allergies      Medication List     STOP taking these medications    cephALEXin 500 MG capsule Commonly known as: KEFLEX  TAKE these medications    albuterol 108 (90 Base) MCG/ACT inhaler Commonly known as: VENTOLIN HFA Inhale 1-2 puffs into the lungs every 6 (six) hours as needed for shortness of breath or wheezing.   aspirin EC 81 MG tablet Take 1 tablet (81 mg total) by mouth daily at 6 (six) AM. Swallow whole.   atorvastatin 40 MG tablet Commonly known as: LIPITOR Take 40 mg by mouth daily.   budesonide-formoterol 160-4.5 MCG/ACT  inhaler Commonly known as: Symbicort Inhale 2 puffs into the lungs 2 (two) times daily. Rinse mouth with water after each use   celecoxib 200 MG capsule Commonly known as: CELEBREX Take 200 mg by mouth daily.   clopidogrel 75 MG tablet Commonly known as: PLAVIX Take 75 mg by mouth daily.   eucerin lotion Apply 1 Application topically as needed for dry skin.   mineral oil-hydrophilic petrolatum ointment Apply 1 Application topically at bedtime.   gabapentin 300 MG capsule Commonly known as: NEURONTIN Take 300 mg by mouth at bedtime.   hydrochlorothiazide 25 MG tablet Commonly known as: HYDRODIURIL Take 25 mg by mouth daily.   losartan 50 MG tablet Commonly known as: COZAAR Take 50 mg by mouth in the morning and at bedtime.   Melatonin 10 MG Tabs Take 10 mg by mouth at bedtime.   metFORMIN 1000 MG tablet Commonly known as: GLUCOPHAGE Take 1,000 mg by mouth 2 (two) times daily with a meal.   multivitamin with minerals Tabs tablet Take 1 tablet by mouth daily.   oxyCODONE 5 MG immediate release tablet Commonly known as: Oxy IR/ROXICODONE Take 1 tablet (5 mg total) by mouth every 6 (six) hours as needed for severe pain.   pantoprazole 40 MG tablet Commonly known as: PROTONIX Take 40 mg by mouth daily.   Evaristo Bury FlexTouch 100 UNIT/ML FlexTouch Pen Generic drug: insulin degludec Inject 38 Units into the skin daily.               Discharge Care Instructions  (From admission, onward)           Start     Ordered   10/18/22 0000  Discharge wound care:       Comments: Take the vac off your left groin incision on 7/12. Wash your incisions daily with antibacterial soap and water. Pat the incisions dry after washing. You can shower at home. You cannot soak in a bath for 4 weeks   10/18/22 1034           Activity: activity as tolerated, no driving while on analgesics, and no heavy lifting for 4 weeks Diet: regular diet Wound Care: keep wound clean and  dry  Follow-up with [facility] in 2 weeks.  SignedLoel Dubonnet, PA-C 10/24/2022 11:20 PM

## 2022-10-25 ENCOUNTER — Telehealth: Payer: Self-pay

## 2022-10-25 NOTE — Telephone Encounter (Signed)
Pt called to let us know that all is going well; he feels incision is healing well and was asking about compression wrap again. I spoke to him Friday as well. He is wondering when he can remove the compression and has been advised to keep it on as needed during the day and to walk periodically as well. No questions/concerns at this time.

## 2022-10-27 DIAGNOSIS — E11621 Type 2 diabetes mellitus with foot ulcer: Secondary | ICD-10-CM | POA: Diagnosis not present

## 2022-10-27 DIAGNOSIS — E13621 Other specified diabetes mellitus with foot ulcer: Secondary | ICD-10-CM | POA: Diagnosis not present

## 2022-10-27 DIAGNOSIS — M545 Low back pain, unspecified: Secondary | ICD-10-CM | POA: Diagnosis not present

## 2022-10-27 DIAGNOSIS — G8929 Other chronic pain: Secondary | ICD-10-CM | POA: Diagnosis not present

## 2022-10-27 DIAGNOSIS — R001 Bradycardia, unspecified: Secondary | ICD-10-CM | POA: Diagnosis not present

## 2022-10-27 DIAGNOSIS — I739 Peripheral vascular disease, unspecified: Secondary | ICD-10-CM | POA: Diagnosis not present

## 2022-10-27 DIAGNOSIS — I700 Atherosclerosis of aorta: Secondary | ICD-10-CM | POA: Diagnosis not present

## 2022-10-27 DIAGNOSIS — R600 Localized edema: Secondary | ICD-10-CM | POA: Diagnosis not present

## 2022-10-27 DIAGNOSIS — L0390 Cellulitis, unspecified: Secondary | ICD-10-CM | POA: Diagnosis not present

## 2022-10-27 DIAGNOSIS — Z713 Dietary counseling and surveillance: Secondary | ICD-10-CM | POA: Diagnosis not present

## 2022-10-27 DIAGNOSIS — L97529 Non-pressure chronic ulcer of other part of left foot with unspecified severity: Secondary | ICD-10-CM | POA: Diagnosis not present

## 2022-11-01 ENCOUNTER — Telehealth: Payer: Self-pay

## 2022-11-01 ENCOUNTER — Other Ambulatory Visit: Payer: Self-pay | Admitting: Physician Assistant

## 2022-11-01 MED ORDER — CEPHALEXIN 500 MG PO CAPS: 500.0000 mg | ORAL_CAPSULE | Freq: Two times a day (BID) | ORAL | 0 refills | Status: DC

## 2022-11-01 NOTE — Telephone Encounter (Signed)
Pt called triage with c/o his incision looking redder today and he is noticing a yellowish drainage. He denies odor/fever/chills. He is already on Keflex, ordered last week by his PCP. Pt is scheduled for his post op here later this week. He has been advised to monitor the area, keep it clean and dry. He will call us back should anything change/worsen before his f/u Thursday. Pt verbalized understanding.

## 2022-11-04 ENCOUNTER — Encounter: Payer: Self-pay | Admitting: Physician Assistant

## 2022-11-04 ENCOUNTER — Ambulatory Visit (INDEPENDENT_AMBULATORY_CARE_PROVIDER_SITE_OTHER): Payer: Medicare Other | Admitting: Physician Assistant

## 2022-11-04 VITALS — BP 136/73 | HR 89 | Temp 98.2°F | Resp 18 | Ht 71.0 in | Wt 181.4 lb

## 2022-11-04 DIAGNOSIS — I70262 Atherosclerosis of native arteries of extremities with gangrene, left leg: Secondary | ICD-10-CM

## 2022-11-04 DIAGNOSIS — I739 Peripheral vascular disease, unspecified: Secondary | ICD-10-CM

## 2022-11-04 NOTE — Progress Notes (Signed)
POST OPERATIVE OFFICE NOTE    CC:  F/u for surgery  HPI:  This is a 77 y.o. male who is s/p left iliofemoral endarterectomy, bovine pericardial patch angioplasty and left femoral to below knee popliteal bypass with nonreversed translocated saphenous vein graft on 10/15/22 by Dr. Edilia Bo.  This was performed secondary to non healing left great toe wound in setting of multilevel arterial occlusive disease. He did well post operatively and was discharged home on POD#3.  Pt returns today for follow up with his wife. Today is his 77th birthday.  Pt states has been concerned and has called several times about swelling in his left leg and foot. Has been instructed to elevate and wear light Ace wrap. Does report that that has been helpful. Says swelling is improved overnight but once he is up walking a while he notices the swelling. He denies any pain in the leg. He has had some redness and little bit of yellow drainage from groin incision and left great toe wound. He is finishing up course of Keflex that was prescribed by his PCP. Denies any fever or chills. Medically managed on Aspirin, Statin and Plavix.    No Known Allergies  Current Outpatient Medications  Medication Sig Dispense Refill   albuterol (VENTOLIN HFA) 108 (90 Base) MCG/ACT inhaler Inhale 1-2 puffs into the lungs every 6 (six) hours as needed for shortness of breath or wheezing.     aspirin EC 81 MG tablet Take 1 tablet (81 mg total) by mouth daily at 6 (six) AM. Swallow whole. 30 tablet 12   atorvastatin (LIPITOR) 40 MG tablet Take 40 mg by mouth daily.     budesonide-formoterol (SYMBICORT) 160-4.5 MCG/ACT inhaler Inhale 2 puffs into the lungs 2 (two) times daily. Rinse mouth with water after each use (Patient not taking: Reported on 10/07/2022) 1 each 1   celecoxib (CELEBREX) 200 MG capsule Take 200 mg by mouth daily.     cephALEXin (KEFLEX) 500 MG capsule Take 1 capsule (500 mg total) by mouth 2 (two) times daily. 14 capsule 0    clopidogrel (PLAVIX) 75 MG tablet Take 75 mg by mouth daily.     Emollient (EUCERIN) lotion Apply 1 Application topically as needed for dry skin.     gabapentin (NEURONTIN) 300 MG capsule Take 300 mg by mouth at bedtime.     hydrochlorothiazide (HYDRODIURIL) 25 MG tablet Take 25 mg by mouth daily.     losartan (COZAAR) 50 MG tablet Take 50 mg by mouth in the morning and at bedtime.     Melatonin 10 MG TABS Take 10 mg by mouth at bedtime.     metFORMIN (GLUCOPHAGE) 1000 MG tablet Take 1,000 mg by mouth 2 (two) times daily with a meal.     mineral oil-hydrophilic petrolatum (AQUAPHOR) ointment Apply 1 Application topically at bedtime.     Multiple Vitamin (MULTIVITAMIN WITH MINERALS) TABS tablet Take 1 tablet by mouth daily.     oxyCODONE (OXY IR/ROXICODONE) 5 MG immediate release tablet Take 1 tablet (5 mg total) by mouth every 6 (six) hours as needed for severe pain. 15 tablet 0   pantoprazole (PROTONIX) 40 MG tablet Take 40 mg by mouth daily.     TRESIBA FLEXTOUCH 100 UNIT/ML FlexTouch Pen Inject 38 Units into the skin daily.     No current facility-administered medications for this visit.     ROS:  See HPI  Physical Exam:  Vitals:   11/04/22 0828  BP: 136/73  Pulse: 89  Resp: 18  Temp: 98.2 F (36.8 C)  SpO2: 96%    General: well appearing, well nourished, in no distress Cardiac: regular rate and rhythm Lungs: non labored Incision:  left groin incision is mildly erythematous, mild sough along incision as show below, no signs of infection.  Saphenectomy and lower leg incisions all intact and well appearing  Extremities:  Brisk doppler DP and PT signals. Left great toe wound with scant drainage. Dry eschar present Neuro: alert and oriented Abdomen:  soft    Assessment/Plan:  This is a 77 y.o. male who is s/p: s/p left iliofemoral endarterectomy, bovine pericardial patch angioplasty and left femoral to below knee popliteal bypass with nonreversed translocated saphenous vein  graft on 10/15/22 by Dr. Edilia Bo.  This was performed secondary to non healing left great toe wound in setting of multilevel arterial occlusive disease. LLE is well perfused with brisk doppler DP and PT signals. Incisions are healing well. He has been wearing boxers that have very tight wide elastic waist band that sits at height of incision. I think this more than anything is causing friction and irritation to the incision. I have advised him for next several weeks wear more loose fitting undergarments. I do not think the incision is infected. He will finish course of antibiotics he is currently on. Recommend he continue to wash incisions with mild soap and water and pat dry. He can tuck dry gauze in the left groin to keep moisture out. His other incisions are healing well. Recommend he paint left toe with betadine. - encourage walking regimen - continue Aspirin, statin, Plavix - he knows to call for earlier follow up if any new or concerning symptoms - Follow up in 4-6 weeks for incision check and LLE bypass graft duplex and ABIs   Nathanial Rancher, [facility] [phone]   Clinic MD:  Edilia Bo

## 2022-11-08 ENCOUNTER — Telehealth: Payer: Self-pay

## 2022-11-08 NOTE — Telephone Encounter (Signed)
Pt called requesting a return call for a question.  Reviewed pt's chart, returned call for clarification, two identifiers used. Pt stated that he was still having issues with swelling, but no other symptoms. When asked if he was elevating, he stated that he had not been doing well with that. He's been more active lately. Instructed him on proper elevation again. He asked about using an ACE wrap. Instructed him on how to properly apply the wrap and call back if he was getting no improvement. Confirmed understanding.

## 2022-11-10 ENCOUNTER — Other Ambulatory Visit: Payer: Self-pay

## 2022-11-10 DIAGNOSIS — I739 Peripheral vascular disease, unspecified: Secondary | ICD-10-CM

## 2022-11-12 DIAGNOSIS — E119 Type 2 diabetes mellitus without complications: Secondary | ICD-10-CM | POA: Diagnosis not present

## 2022-11-12 DIAGNOSIS — Z794 Long term (current) use of insulin: Secondary | ICD-10-CM | POA: Diagnosis not present

## 2022-11-25 ENCOUNTER — Telehealth: Payer: Self-pay

## 2022-11-25 NOTE — Telephone Encounter (Signed)
Pt called stating that the incision at his groin was painful and red.  Reviewed pt's chart, returned call for clarification, no answer, lf vm. Instructed him to keep the area washed well and dry. If he is having any new pain or redness worse than at his last visit, he should call back.

## 2022-12-06 ENCOUNTER — Telehealth: Payer: Self-pay

## 2022-12-06 NOTE — Telephone Encounter (Signed)
Pt called c/o BLE swelling and the top of his incision not healing. He is very concerned and is requesting an appt.  Reviewed pt's chart, returned call for clarification, no answer, lf vm. Informed him that appts were held to move his f/u earlier to this Thursday. Instructed him to call back if that day and time worked for him.  Randy Jacobson informed me that the pt had called back and she got him scheduled in the held appt slots.

## 2022-12-07 DIAGNOSIS — E119 Type 2 diabetes mellitus without complications: Secondary | ICD-10-CM | POA: Diagnosis not present

## 2022-12-07 DIAGNOSIS — Z794 Long term (current) use of insulin: Secondary | ICD-10-CM | POA: Diagnosis not present

## 2022-12-09 ENCOUNTER — Encounter: Payer: Self-pay | Admitting: Physician Assistant

## 2022-12-09 ENCOUNTER — Ambulatory Visit: Payer: Medicare Other

## 2022-12-09 ENCOUNTER — Ambulatory Visit (HOSPITAL_COMMUNITY)
Admission: RE | Admit: 2022-12-09 | Discharge: 2022-12-09 | Disposition: A | Discharge status: Home / Self Care | Payer: Medicare Other | Source: Ambulatory Visit | Attending: Vascular Surgery | Admitting: Vascular Surgery

## 2022-12-09 ENCOUNTER — Ambulatory Visit (INDEPENDENT_AMBULATORY_CARE_PROVIDER_SITE_OTHER)
Admission: RE | Admit: 2022-12-09 | Discharge: 2022-12-09 | Disposition: A | Discharge status: Home / Self Care | Payer: Medicare Other | Source: Ambulatory Visit | Attending: Vascular Surgery | Admitting: Vascular Surgery

## 2022-12-09 VITALS — BP 152/73 | HR 90 | Temp 98.6°F | Resp 18 | Ht 71.0 in | Wt 181.8 lb

## 2022-12-09 DIAGNOSIS — I739 Peripheral vascular disease, unspecified: Secondary | ICD-10-CM

## 2022-12-09 DIAGNOSIS — R0989 Other specified symptoms and signs involving the circulatory and respiratory systems: Secondary | ICD-10-CM

## 2022-12-09 LAB — VAS US ABI WITH/WO TBI: Left ABI: 1.04

## 2022-12-09 NOTE — Progress Notes (Signed)
HISTORY AND PHYSICAL     CC:  follow up. Requesting Provider:  Benita Stabile, MD  HPI: This is a 77 y.o. male who is here today for follow up for PAD.  Pt has hx of left iliofemoral endarterectomy with pericardial patch angioplasty, left femoral to below knee popliteal bypass with non reversed translocated vein for gangrenous left great toe on 10/15/2022 by Dr. Edilia Bo.  The pt returns today for follow up.  He was seen on 11/04/2022 and at that time, he had concerns about swelling in the left leg and foot and had been instructed to wrap with ace and elevate.  He did have some redness and mild yellow drainage from the groin incision and the left great toe wound.  He was finishing his Keflex course.  He was not having any fevers. It was felt the redness around his incision was due to tight waist band and was instructed not to wear those underwear.  He was encouraged to paint his toe wound with betadine and f/u in 4-6  weeks with studies.    He comes in today and here with his wife.  He states he continues to have mild swelling in the left leg since his bypass.  It improves some with elevation.  He has not been wearing compression.  He states that his left groin occasionally will have some clear drainage. He denies any fevers or chills.  He states his leg does feel better.  He states his toe wound is healing.  He has been putting some ointment that he uses on his feet on his incision.    He states since surgery, he has been having some left eye issues.  He cannot really explain it.  Denies vision loss or pain and just says that it is different.  He denies any unilateral weakness, numbness or paralysis or speech difficulties.  Fortunately, he has not been smoking in many years.    The pt is on a statin for cholesterol management.    The pt is on an aspirin.    Other AC:  Plavix The pt is on ARB, diuretic for hypertension.  The pt is  on medication for diabetes. Tobacco hx:  former   Past Medical  History:  Diagnosis Date   Diabetes mellitus without complication (HCC)    Dysrhythmia    chronic RBBB; bradycardia s/p atropine following sheath removal, 3.8 second sinus pause 10/11/22   Hypertension    Peripheral artery disease [facility])     Past Surgical History:  Procedure Laterality Date   "stents in both my legs"     ABDOMINAL AORTOGRAM W/LOWER EXTREMITY N/A 10/11/2022   Procedure: ABDOMINAL AORTOGRAM W/LOWER EXTREMITY;  Surgeon: Chuck Hint, MD;  Location: [facility] INVASIVE CV LAB;  Service: Cardiovascular;  Laterality: N/A;   ENDARTERECTOMY FEMORAL Left 10/15/2022   Procedure: ENDARTERECTOMY OF LEFT ILIOFEMORAL ARTERY;  Surgeon: Chuck Hint, MD;  Location: [facility] OR;  Service: Vascular;  Laterality: Left;   FEMORAL-POPLITEAL BYPASS GRAFT Left 10/15/2022   Procedure: LEFT FEMORAL-BELOW KNEE POPLITEAL ARTERY BYPASS WITH VEIN HARVEST OF LEFT GREATER SAPHENOUS VEIN;  Surgeon: Chuck Hint, MD;  Location: [facility] OR;  Service: Vascular;  Laterality: Left;   GIVENS CAPSULE STUDY N/A 04/10/2017   Procedure: GIVENS CAPSULE STUDY;  Surgeon: Charlott Rakes, MD;  Location: [facility] ENDOSCOPY;  Service: Endoscopy;  Laterality: N/A;   IR GENERIC HISTORICAL  08/26/2015   IR RADIOLOGIST EVAL & MGMT 08/26/2015 Malachy Moan, MD [facility] INTERV RAD  IR GENERIC HISTORICAL  04/21/2016   IR RADIOLOGIST EVAL & MGMT 04/21/2016 Malachy Moan, MD [facility] INTERV RAD   [facility] ANGIOPLASTY Left 10/15/2022   Procedure: PATCH ANGIOPLASTY USING XENOSURE BIOLOGIC PATCH 1CMX14CM;  Surgeon: Chuck Hint, MD;  Location: [facility] OR;  Service: Vascular;  Laterality: Left;   PERIPHERAL VASCULAR INTERVENTION  10/11/2022   Procedure: PERIPHERAL VASCULAR INTERVENTION;  Surgeon: Chuck Hint, MD;  Location: [facility] INVASIVE CV LAB;  Service: Cardiovascular;;    No Known Allergies  Current Outpatient Medications  Medication Sig Dispense Refill   albuterol (VENTOLIN HFA) 108 (90 Base) MCG/ACT inhaler Inhale  1-2 puffs into the lungs every 6 (six) hours as needed for shortness of breath or wheezing.     aspirin EC 81 MG tablet Take 1 tablet (81 mg total) by mouth daily at 6 (six) AM. Swallow whole. 30 tablet 12   atorvastatin (LIPITOR) 40 MG tablet Take 40 mg by mouth daily.     budesonide-formoterol (SYMBICORT) 160-4.5 MCG/ACT inhaler Inhale 2 puffs into the lungs 2 (two) times daily. Rinse mouth with water after each use 1 each 1   celecoxib (CELEBREX) 200 MG capsule Take 200 mg by mouth daily.     cephALEXin (KEFLEX) 500 MG capsule Take 1 capsule (500 mg total) by mouth 2 (two) times daily. 14 capsule 0   clopidogrel (PLAVIX) 75 MG tablet Take 75 mg by mouth daily.     Emollient (EUCERIN) lotion Apply 1 Application topically as needed for dry skin.     gabapentin (NEURONTIN) 300 MG capsule Take 300 mg by mouth at bedtime.     hydrochlorothiazide (HYDRODIURIL) 25 MG tablet Take 25 mg by mouth daily.     losartan (COZAAR) 50 MG tablet Take 50 mg by mouth in the morning and at bedtime.     Melatonin 10 MG TABS Take 10 mg by mouth at bedtime.     metFORMIN (GLUCOPHAGE) 1000 MG tablet Take 1,000 mg by mouth 2 (two) times daily with a meal.     mineral oil-hydrophilic petrolatum (AQUAPHOR) ointment Apply 1 Application topically at bedtime.     Multiple Vitamin (MULTIVITAMIN WITH MINERALS) TABS tablet Take 1 tablet by mouth daily.     oxyCODONE (OXY IR/ROXICODONE) 5 MG immediate release tablet Take 1 tablet (5 mg total) by mouth every 6 (six) hours as needed for severe pain. 15 tablet 0   pantoprazole (PROTONIX) 40 MG tablet Take 40 mg by mouth daily.     TRESIBA FLEXTOUCH 100 UNIT/ML FlexTouch Pen Inject 38 Units into the skin daily.     No current facility-administered medications for this visit.    Family History  Problem Relation Age of Onset   Diabetes Brother     Social History   Socioeconomic History   Marital status: Significant Other    Spouse name: Not on file   Number of children:  Not on file   Years of education: Not on file   Highest education level: Not on file  Occupational History   Not on file  Tobacco Use   Smoking status: Former    Current packs/day: 0.00    Average packs/day: 1 pack/day for 36.0 years (36.0 ttl pk-yrs)    Types: Cigarettes    Start date: 11/20/1957    Quit date: 11/20/1993    Years since quitting: 29.0   Smokeless tobacco: Never  Vaping Use   Vaping status: Never Used  Substance and Sexual Activity   Alcohol use: Yes  Alcohol/week: 0.0 standard drinks of alcohol    Comment: occ   Drug use: Never   Sexual activity: Not on file  Other Topics Concern   Not on file  Social History Narrative   Not on file   Social Determinants of Health   Financial Resource Strain: Low Risk  (01/29/2022)   Received from [facility], [facility]   Overall Financial Resource Strain (CARDIA)    Difficulty of Paying Living Expenses: Not hard at all  Food Insecurity: No Food Insecurity (10/16/2022)   Hunger Vital Sign    Worried About Running Out of Food in the Last Year: Never true    Ran Out of Food in the Last Year: Never true  Transportation Needs: No Transportation Needs (10/16/2022)   PRAPARE - Administrator, Civil Service (Medical): No    Lack of Transportation (Non-Medical): No  Physical Activity: Not on file  Stress: No Stress Concern Present (01/29/2022)   Received from [facility], [facility] of Occupational Health - Occupational Stress Questionnaire    Feeling of Stress : Not at all  Social Connections: Not on file  Intimate Partner Violence: Not At Risk (10/16/2022)   Humiliation, Afraid, Rape, and Kick questionnaire    Fear of Current or Ex-Partner: No    Emotionally Abused: No    Physically Abused: No    Sexually Abused: No     PHYSICAL EXAMINATION:  Today's Vitals   12/09/22 0820  BP: (!) 152/73  Pulse: 90  Resp: 18  Temp: 98.6 F (37 C)  TempSrc: Temporal  SpO2: 95%   Weight: 181 lb 12.8 oz (82.5 kg)  Height: 5\' 11"  (1.803 m)   Body mass index is 25.36 kg/m.   General:  WDWN in NAD; vital signs documented above Pulmonary: normal non-labored breathing , without wheezing Cardiac: regular HR, with carotid bruit on the left Skin: without rashes Vascular Exam/Pulses: Brisk doppler flow left DP/PT  Extremities: toe wound left great toe is dry;  very mild swelling LLE  Musculoskeletal: no muscle wasting or atrophy  Neurologic: A&O X 3 Psychiatric:  The pt has Normal affect.   Non-Invasive Vascular Imaging:   ABI's/TBI's on 12/09/2022: Right:  [place]/0 - Great toe pressure: 0 Left:  1.04/0.13 - Great toe pressure: 21  Arterial duplex on 12/09/2022: +--------------------+--------+--------+---------+--------+  Inflow             60              triphasic          +--------------------+--------+--------+---------+--------+  Proximal Anastomosis113             biphasic           +--------------------+--------+--------+---------+--------+  Proximal Graft      113             triphasic          +--------------------+--------+--------+---------+--------+  Mid Graft           107             triphasic          +--------------------+--------+--------+---------+--------+  Distal Graft        119             triphasic          +--------------------+--------+--------+---------+--------+  Distal Anastomosis  167             triphasic          +--------------------+--------+--------+---------+--------+  Outflow            104             triphasic          +--------------------+--------+--------+---------+--------+   Summary:  Left: Patent Left femoral-below knee popliteal artery bypass graft without evidence of hemodynamically significant stenosis.   Previous ABI's/TBI's on 09/30/2022: Right:  0.59/0.35 - Great toe pressure: 63 Left:  0.57/absent - Great toe pressure:  absent     ASSESSMENT/PLAN:: 77 y.o. male  here for follow up for PAD with hx of left iliofemoral endarterectomy with pericardial patch angioplasty, left femoral to below knee popliteal bypass with non reversed translocated vein for gangrenous left great toe on 10/15/2022 by Dr. Edilia Bo.   -ABI improved on the left and bypass is patent without stenosis.  He states is toe wound is improving. He still has some mild LLE swelling, which is common after bypass surgery especially with vein harvest.  We have measured him for a 15-20 mm Hg compression sock that he will wear during the day and take off at night.  He will elevate his legs when he is not up and about.   -his left groin incision is healing but has a very superficial dehiscence of the proximal portion.  I debrided the scab and put wet to dry dressing in place.  I instructed pt to get hydrogel at pharmacy and apply to this area daily after shower and cover with dry gauze.   -he has some left eye "differences" that he cannot really describe that has happened a few times since surgery.  He denies visual changes or pain just "different".  I do hear a left carotid bruit.  Will bring him back in a couple of weeks for incision check and at that time, will get carotid duplex as I do not see he has had one in the past.  They will call sooner if any issues.  -continue asa/statin/plavix    Doreatha Massed, [facility] [phone]  Clinic MD:   Edilia Bo

## 2022-12-22 ENCOUNTER — Other Ambulatory Visit: Payer: Self-pay | Admitting: *Deleted

## 2022-12-22 DIAGNOSIS — R0989 Other specified symptoms and signs involving the circulatory and respiratory systems: Secondary | ICD-10-CM

## 2022-12-23 ENCOUNTER — Other Ambulatory Visit (HOSPITAL_COMMUNITY): Payer: Medicare Other

## 2022-12-23 ENCOUNTER — Encounter (HOSPITAL_COMMUNITY): Payer: Medicare Other

## 2022-12-23 ENCOUNTER — Ambulatory Visit: Payer: Medicare Other

## 2022-12-29 DIAGNOSIS — E782 Mixed hyperlipidemia: Secondary | ICD-10-CM | POA: Diagnosis not present

## 2022-12-29 DIAGNOSIS — E118 Type 2 diabetes mellitus with unspecified complications: Secondary | ICD-10-CM | POA: Diagnosis not present

## 2022-12-30 ENCOUNTER — Other Ambulatory Visit: Payer: Self-pay

## 2022-12-30 ENCOUNTER — Ambulatory Visit (HOSPITAL_COMMUNITY)
Admission: RE | Admit: 2022-12-30 | Discharge: 2022-12-30 | Disposition: A | Discharge status: Home / Self Care | Payer: Medicare Other | Source: Ambulatory Visit | Attending: Vascular Surgery | Admitting: Vascular Surgery

## 2022-12-30 ENCOUNTER — Ambulatory Visit: Payer: Medicare Other | Admitting: Physician Assistant

## 2022-12-30 VITALS — BP 151/71 | HR 82 | Temp 97.9°F | Resp 18 | Ht 71.0 in | Wt 183.6 lb

## 2022-12-30 DIAGNOSIS — I6521 Occlusion and stenosis of right carotid artery: Secondary | ICD-10-CM | POA: Diagnosis not present

## 2022-12-30 DIAGNOSIS — R0989 Other specified symptoms and signs involving the circulatory and respiratory systems: Secondary | ICD-10-CM | POA: Insufficient documentation

## 2022-12-30 NOTE — Progress Notes (Signed)
History of Present Illness:  Patient is a 77 y.o. year old male who presents for evaluation of carotid stenosis.  The patient denies symptoms of TIA, amaurosis, or stroke.  He was found to have a carotid bruit on exam on 12/09/22. He has no history of Stroke/TIA.    He has been followed by Dr. Edilia Bo in the recent past he performed left iliofemoral endarterectomy with pericardial patch angioplasty, left femoral to below knee popliteal bypass with non reversed translocated vein for gangrenous left great toe on 10/15/2022.  The toe is healing well with smaller callus formation and no gangrene.     The pt is on a statin for cholesterol management.    The pt is on an aspirin.    Other AC:  Plavix The pt is on ARB, diuretic for hypertension.  The pt is  on medication for diabetes. Tobacco hx:  former    Past Medical History:  Diagnosis Date   Diabetes mellitus without complication (HCC)    Dysrhythmia    chronic RBBB; bradycardia s/p atropine following sheath removal, 3.8 second sinus pause 10/11/22   Hypertension    Peripheral artery disease [facility])     Past Surgical History:  Procedure Laterality Date   "stents in both my legs"     ABDOMINAL AORTOGRAM W/LOWER EXTREMITY N/A 10/11/2022   Procedure: ABDOMINAL AORTOGRAM W/LOWER EXTREMITY;  Surgeon: Chuck Hint, MD;  Location: [facility] INVASIVE CV LAB;  Service: Cardiovascular;  Laterality: N/A;   ENDARTERECTOMY FEMORAL Left 10/15/2022   Procedure: ENDARTERECTOMY OF LEFT ILIOFEMORAL ARTERY;  Surgeon: Chuck Hint, MD;  Location: [facility] OR;  Service: Vascular;  Laterality: Left;   FEMORAL-POPLITEAL BYPASS GRAFT Left 10/15/2022   Procedure: LEFT FEMORAL-BELOW KNEE POPLITEAL ARTERY BYPASS WITH VEIN HARVEST OF LEFT GREATER SAPHENOUS VEIN;  Surgeon: Chuck Hint, MD;  Location: [facility] OR;  Service: Vascular;  Laterality: Left;   GIVENS CAPSULE STUDY N/A 04/10/2017   Procedure: GIVENS CAPSULE STUDY;  Surgeon: Charlott Rakes, MD;   Location: [facility] ENDOSCOPY;  Service: Endoscopy;  Laterality: N/A;   IR GENERIC HISTORICAL  08/26/2015   IR RADIOLOGIST EVAL & MGMT 08/26/2015 Malachy Moan, MD [facility] INTERV RAD   IR GENERIC HISTORICAL  04/21/2016   IR RADIOLOGIST EVAL & MGMT 04/21/2016 Malachy Moan, MD [facility] INTERV RAD   [facility] ANGIOPLASTY Left 10/15/2022   Procedure: PATCH ANGIOPLASTY USING XENOSURE BIOLOGIC PATCH 1CMX14CM;  Surgeon: Chuck Hint, MD;  Location: [facility] OR;  Service: Vascular;  Laterality: Left;   PERIPHERAL VASCULAR INTERVENTION  10/11/2022   Procedure: PERIPHERAL VASCULAR INTERVENTION;  Surgeon: Chuck Hint, MD;  Location: [facility] INVASIVE CV LAB;  Service: Cardiovascular;;     Social History Social History   Tobacco Use   Smoking status: Former    Current packs/day: 0.00    Average packs/day: 1 pack/day for 36.0 years (36.0 ttl pk-yrs)    Types: Cigarettes    Start date: 11/20/1957    Quit date: 11/20/1993    Years since quitting: 29.1   Smokeless tobacco: Never  Vaping Use   Vaping status: Never Used  Substance Use Topics   Alcohol use: Yes    Alcohol/week: 0.0 standard drinks of alcohol    Comment: occ   Drug use: Never    Family History Family History  Problem Relation Age of Onset   Diabetes Brother     Allergies  No Known Allergies   Current Outpatient Medications  Medication Sig Dispense Refill   albuterol (VENTOLIN HFA)  108 (90 Base) MCG/ACT inhaler Inhale 1-2 puffs into the lungs every 6 (six) hours as needed for shortness of breath or wheezing.     aspirin EC 81 MG tablet Take 1 tablet (81 mg total) by mouth daily at 6 (six) AM. Swallow whole. 30 tablet 12   atorvastatin (LIPITOR) 40 MG tablet Take 40 mg by mouth daily.     budesonide-formoterol (SYMBICORT) 160-4.5 MCG/ACT inhaler Inhale 2 puffs into the lungs 2 (two) times daily. Rinse mouth with water after each use 1 each 1   celecoxib (CELEBREX) 200 MG capsule Take 200 mg by mouth daily.     cephALEXin  (KEFLEX) 500 MG capsule Take 1 capsule (500 mg total) by mouth 2 (two) times daily. 14 capsule 0   clopidogrel (PLAVIX) 75 MG tablet Take 75 mg by mouth daily.     Emollient (EUCERIN) lotion Apply 1 Application topically as needed for dry skin.     gabapentin (NEURONTIN) 300 MG capsule Take 300 mg by mouth at bedtime.     hydrochlorothiazide (HYDRODIURIL) 25 MG tablet Take 25 mg by mouth daily.     losartan (COZAAR) 50 MG tablet Take 50 mg by mouth in the morning and at bedtime.     Melatonin 10 MG TABS Take 10 mg by mouth at bedtime.     metFORMIN (GLUCOPHAGE) 1000 MG tablet Take 1,000 mg by mouth 2 (two) times daily with a meal.     mineral oil-hydrophilic petrolatum (AQUAPHOR) ointment Apply 1 Application topically at bedtime.     Multiple Vitamin (MULTIVITAMIN WITH MINERALS) TABS tablet Take 1 tablet by mouth daily.     oxyCODONE (OXY IR/ROXICODONE) 5 MG immediate release tablet Take 1 tablet (5 mg total) by mouth every 6 (six) hours as needed for severe pain. 15 tablet 0   pantoprazole (PROTONIX) 40 MG tablet Take 40 mg by mouth daily.     TRESIBA FLEXTOUCH 100 UNIT/ML FlexTouch Pen Inject 38 Units into the skin daily.     No current facility-administered medications for this visit.    ROS:   General:  No weight loss, Fever, chills  HEENT: No recent headaches, no nasal bleeding, no visual changes, no sore throat  Neurologic: No dizziness, blackouts, seizures. No recent symptoms of stroke or mini- stroke. No recent episodes of slurred speech, or temporary blindness.  Cardiac: No recent episodes of chest pain/pressure, no shortness of breath at rest.  No shortness of breath with exertion.  Denies history of atrial fibrillation or irregular heartbeat  Vascular: No history of rest pain in feet.  No history of claudication.  positive history of non-healing ulcer, No history of DVT   Pulmonary: No home oxygen, no productive cough, no hemoptysis,  No asthma or wheezing  Musculoskeletal:   [ ]  Arthritis, [ ]  Low back pain,  [ ]  Joint pain  Hematologic:No history of hypercoagulable state.  No history of easy bleeding.  No history of anemia  Gastrointestinal: No hematochezia or melena,  No gastroesophageal reflux, no trouble swallowing  Urinary: [ ]  chronic Kidney disease, [ ]  on HD - [ ]  MWF or [ ]  TTHS, [ ]  Burning with urination, [ ]  Frequent urination, [ ]  Difficulty urinating;   Skin: No rashes  Psychological: No history of anxiety,  No history of depression   Physical Examination  Vitals:   12/30/22 0855  BP: (!) 151/71  Pulse: 82  Resp: 18  Temp: 97.9 F (36.6 C)  TempSrc: Temporal  SpO2: 97%  Weight:  183 lb 9.6 oz (83.3 kg)  Height: 5\' 11"  (1.803 m)    Body mass index is 25.61 kg/m.  General:  Alert and oriented, no acute distress HEENT: Normal Neck: positive  bruit left Pulmonary: Clear to auscultation bilaterally Cardiac: Regular Rate and Rhythm without murmur Gastrointestinal: Soft, non-tender, non-distended, no mass, no scars Skin: No rash, Gt toe callus without gangrene, left groin healing well without drainage or erythema Extremity Pulses:  2+ radial palpable ,  doppler DP and PT signals left LE Musculoskeletal: No deformity or edema  Neurologic: Upper and lower extremity motor 5/5 and symmetric  DATA:     Right Carotid Findings:  +----------+-------+--------+--------+--------------------------------+----  ----+           PSV    EDV cm/sStenosisPlaque Description               Comments           cm/s                                                              +----------+-------+--------+--------+--------------------------------+----  ----+  CCA Prox  55     11              heterogenous                               +----------+-------+--------+--------+--------------------------------+----  ----+  CCA Mid   60     14              heterogenous                                +----------+-------+--------+--------+--------------------------------+----  ----+  CCA Distal68     13              calcific and heterogenous                  +----------+-------+--------+--------+--------------------------------+----  ----+  ICA Prox  562    135     80-99%  calcific, homogeneous and                                                   irregular                                  +----------+-------+--------+--------+--------------------------------+----  ----+  ICA Mid   100    17              calcific                                   +----------+-------+--------+--------+--------------------------------+----  ----+  ICA Distal62     16                                                         +----------+-------+--------+--------+--------------------------------+----  ----+  ECA      149    33                                              NWV        +----------+-------+--------+--------+--------------------------------+----  ----+   +----------+--------+-------+----------------+-------------------+           PSV cm/sEDV cmsDescribe        Arm Pressure (mmHG)  +----------+--------+-------+----------------+-------------------+  [id number]    2      Multiphasic, [id number]                  +----------+--------+-------+----------------+-------------------+   +---------+--------+--+--------+--+---------+  VertebralPSV cm/s82EDV cm/s19Antegrade  +---------+--------+--+--------+--+---------+      Left Carotid Findings:  +----------+--------+--------+--------+------------------+--------+           PSV cm/sEDV cm/sStenosisPlaque DescriptionComments  +----------+--------+--------+--------+------------------+--------+  CCA Prox  172     32                                          +----------+--------+--------+--------+------------------+--------+  CCA Mid   163     31                                           +----------+--------+--------+--------+------------------+--------+  CCA Distal150     27              heterogenous                +----------+--------+--------+--------+------------------+--------+  ICA Prox  250     48      40-59%  heterogenous                +----------+--------+--------+--------+------------------+--------+  ICA Mid   122     27                                          +----------+--------+--------+--------+------------------+--------+  ICA Distal143     34                                          +----------+--------+--------+--------+------------------+--------+  ECA      190     17                                          +----------+--------+--------+--------+------------------+--------+   +----------+--------+--------+----------------+-------------------+           PSV cm/sEDV cm/sDescribe        Arm Pressure (mmHG)  +----------+--------+--------+----------------+-------------------+  Subclavian241    4       Multiphasic, [id number]                  +----------+--------+--------+----------------+-------------------+   +---------+--------+--+--------+--+---------+  VertebralPSV cm/s33EDV cm/s11Antegrade  +---------+--------+--+--------+--+---------+         Summary:  Right Carotid: Velocities in the right ICA are consistent with a 80-99%  stenosis.   Left Carotid: Velocities in the left ICA are consistent with a 40-59%  stenosis.   Vertebrals: Bilateral vertebral arteries demonstrate antegrade flow.  Subclavians: Normal flow hemodynamics were seen in bilateral subclavian               arteries.   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  EDV 135 cm/s I will order CTA of the neck and have him f/u with Dr. Hetty Blend to discuss intervention CEA verse TCAR.  He denies amaurosis, aphasia or weakness in extremities.   Healing left groin and GT wound s/p bypass  by Dr. Aneta Mins PA-C [facility] Office: [phone]  MD in clinic [place]

## 2023-01-04 DIAGNOSIS — M545 Low back pain, unspecified: Secondary | ICD-10-CM | POA: Diagnosis not present

## 2023-01-04 DIAGNOSIS — E782 Mixed hyperlipidemia: Secondary | ICD-10-CM | POA: Diagnosis not present

## 2023-01-04 DIAGNOSIS — I700 Atherosclerosis of aorta: Secondary | ICD-10-CM | POA: Diagnosis not present

## 2023-01-04 DIAGNOSIS — E11621 Type 2 diabetes mellitus with foot ulcer: Secondary | ICD-10-CM | POA: Diagnosis not present

## 2023-01-04 DIAGNOSIS — R001 Bradycardia, unspecified: Secondary | ICD-10-CM | POA: Diagnosis not present

## 2023-01-04 DIAGNOSIS — R809 Proteinuria, unspecified: Secondary | ICD-10-CM | POA: Diagnosis not present

## 2023-01-04 DIAGNOSIS — E118 Type 2 diabetes mellitus with unspecified complications: Secondary | ICD-10-CM | POA: Diagnosis not present

## 2023-01-04 DIAGNOSIS — D509 Iron deficiency anemia, unspecified: Secondary | ICD-10-CM | POA: Diagnosis not present

## 2023-01-04 DIAGNOSIS — I6529 Occlusion and stenosis of unspecified carotid artery: Secondary | ICD-10-CM | POA: Diagnosis not present

## 2023-01-04 DIAGNOSIS — I10 Essential (primary) hypertension: Secondary | ICD-10-CM | POA: Diagnosis not present

## 2023-01-04 DIAGNOSIS — I739 Peripheral vascular disease, unspecified: Secondary | ICD-10-CM | POA: Diagnosis not present

## 2023-01-04 DIAGNOSIS — E13621 Other specified diabetes mellitus with foot ulcer: Secondary | ICD-10-CM | POA: Diagnosis not present

## 2023-01-13 ENCOUNTER — Ambulatory Visit (HOSPITAL_COMMUNITY): Payer: Medicare Other

## 2023-01-16 ENCOUNTER — Emergency Department (HOSPITAL_COMMUNITY): Payer: Medicare Other

## 2023-01-16 ENCOUNTER — Inpatient Hospital Stay (HOSPITAL_COMMUNITY)
Admission: EM | Admit: 2023-01-16 | Discharge: 2023-01-19 | DRG: 378 | Disposition: A | Discharge status: Home / Self Care | Payer: Medicare Other | Attending: Internal Medicine | Admitting: Internal Medicine

## 2023-01-16 ENCOUNTER — Other Ambulatory Visit: Payer: Self-pay

## 2023-01-16 DIAGNOSIS — K648 Other hemorrhoids: Secondary | ICD-10-CM | POA: Diagnosis not present

## 2023-01-16 DIAGNOSIS — Z87891 Personal history of nicotine dependence: Secondary | ICD-10-CM

## 2023-01-16 DIAGNOSIS — R55 Syncope and collapse: Principal | ICD-10-CM

## 2023-01-16 DIAGNOSIS — K5731 Diverticulosis of large intestine without perforation or abscess with bleeding: Secondary | ICD-10-CM | POA: Diagnosis present

## 2023-01-16 DIAGNOSIS — K3189 Other diseases of stomach and duodenum: Secondary | ICD-10-CM | POA: Diagnosis present

## 2023-01-16 DIAGNOSIS — Z7951 Long term (current) use of inhaled steroids: Secondary | ICD-10-CM | POA: Diagnosis not present

## 2023-01-16 DIAGNOSIS — Z7984 Long term (current) use of oral hypoglycemic drugs: Secondary | ICD-10-CM

## 2023-01-16 DIAGNOSIS — K922 Gastrointestinal hemorrhage, unspecified: Secondary | ICD-10-CM | POA: Diagnosis not present

## 2023-01-16 DIAGNOSIS — E785 Hyperlipidemia, unspecified: Secondary | ICD-10-CM | POA: Diagnosis present

## 2023-01-16 DIAGNOSIS — Z7902 Long term (current) use of antithrombotics/antiplatelets: Secondary | ICD-10-CM | POA: Diagnosis not present

## 2023-01-16 DIAGNOSIS — R195 Other fecal abnormalities: Secondary | ICD-10-CM | POA: Diagnosis not present

## 2023-01-16 DIAGNOSIS — Z7982 Long term (current) use of aspirin: Secondary | ICD-10-CM | POA: Diagnosis not present

## 2023-01-16 DIAGNOSIS — D500 Iron deficiency anemia secondary to blood loss (chronic): Secondary | ICD-10-CM | POA: Diagnosis not present

## 2023-01-16 DIAGNOSIS — R001 Bradycardia, unspecified: Secondary | ICD-10-CM | POA: Diagnosis present

## 2023-01-16 DIAGNOSIS — E119 Type 2 diabetes mellitus without complications: Secondary | ICD-10-CM

## 2023-01-16 DIAGNOSIS — Z95820 Peripheral vascular angioplasty status with implants and grafts: Secondary | ICD-10-CM

## 2023-01-16 DIAGNOSIS — K5711 Diverticulosis of small intestine without perforation or abscess with bleeding: Secondary | ICD-10-CM | POA: Diagnosis not present

## 2023-01-16 DIAGNOSIS — Z833 Family history of diabetes mellitus: Secondary | ICD-10-CM | POA: Diagnosis not present

## 2023-01-16 DIAGNOSIS — D649 Anemia, unspecified: Secondary | ICD-10-CM

## 2023-01-16 DIAGNOSIS — D62 Acute posthemorrhagic anemia: Secondary | ICD-10-CM | POA: Diagnosis not present

## 2023-01-16 DIAGNOSIS — E1151 Type 2 diabetes mellitus with diabetic peripheral angiopathy without gangrene: Secondary | ICD-10-CM | POA: Diagnosis present

## 2023-01-16 DIAGNOSIS — I951 Orthostatic hypotension: Secondary | ICD-10-CM | POA: Diagnosis present

## 2023-01-16 DIAGNOSIS — E11649 Type 2 diabetes mellitus with hypoglycemia without coma: Secondary | ICD-10-CM | POA: Diagnosis not present

## 2023-01-16 DIAGNOSIS — I4510 Unspecified right bundle-branch block: Secondary | ICD-10-CM | POA: Diagnosis not present

## 2023-01-16 DIAGNOSIS — R404 Transient alteration of awareness: Secondary | ICD-10-CM | POA: Diagnosis not present

## 2023-01-16 DIAGNOSIS — I499 Cardiac arrhythmia, unspecified: Secondary | ICD-10-CM | POA: Diagnosis not present

## 2023-01-16 DIAGNOSIS — Z79899 Other long term (current) drug therapy: Secondary | ICD-10-CM | POA: Diagnosis not present

## 2023-01-16 DIAGNOSIS — I10 Essential (primary) hypertension: Secondary | ICD-10-CM | POA: Diagnosis present

## 2023-01-16 DIAGNOSIS — K254 Chronic or unspecified gastric ulcer with hemorrhage: Secondary | ICD-10-CM | POA: Diagnosis not present

## 2023-01-16 DIAGNOSIS — R6889 Other general symptoms and signs: Secondary | ICD-10-CM | POA: Diagnosis not present

## 2023-01-16 DIAGNOSIS — I739 Peripheral vascular disease, unspecified: Secondary | ICD-10-CM | POA: Diagnosis not present

## 2023-01-16 DIAGNOSIS — Z743 Need for continuous supervision: Secondary | ICD-10-CM | POA: Diagnosis not present

## 2023-01-16 LAB — TYPE AND SCREEN
ABO/RH(D): O NEG
Antibody Screen: NEGATIVE

## 2023-01-16 LAB — CBC WITH DIFFERENTIAL/PLATELET
Abs Immature Granulocytes: 0.1 10*3/uL — ABNORMAL HIGH (ref 0.00–0.07)
Basophils Absolute: 0.1 10*3/uL (ref 0.0–0.1)
Basophils Relative: 1 %
Eosinophils Absolute: 0.2 10*3/uL (ref 0.0–0.5)
Eosinophils Relative: 2 %
HCT: 26.4 % — ABNORMAL LOW (ref 39.0–52.0)
Hemoglobin: 7.9 g/dL — ABNORMAL LOW (ref 13.0–17.0)
Immature Granulocytes: 1 %
Lymphocytes Relative: 9 %
Lymphs Abs: 0.9 10*3/uL (ref 0.7–4.0)
MCH: 25.2 pg — ABNORMAL LOW (ref 26.0–34.0)
MCHC: 29.9 g/dL — ABNORMAL LOW (ref 30.0–36.0)
MCV: 84.3 fL (ref 80.0–100.0)
Monocytes Absolute: 1.1 10*3/uL — ABNORMAL HIGH (ref 0.1–1.0)
Monocytes Relative: 11 %
Neutro Abs: 7.8 10*3/uL — ABNORMAL HIGH (ref 1.7–7.7)
Neutrophils Relative %: 76 %
Platelets: 262 10*3/uL (ref 150–400)
RBC: 3.13 MIL/uL — ABNORMAL LOW (ref 4.22–5.81)
RDW: 16.6 % — ABNORMAL HIGH (ref 11.5–15.5)
WBC: 10.1 10*3/uL (ref 4.0–10.5)
nRBC: 0 % (ref 0.0–0.2)

## 2023-01-16 LAB — IRON AND TIBC
Iron: 14 ug/dL — ABNORMAL LOW (ref 45–182)
Saturation Ratios: 4 % — ABNORMAL LOW (ref 17.9–39.5)
TIBC: 384 ug/dL (ref 250–450)
UIBC: 370 ug/dL

## 2023-01-16 LAB — CBG MONITORING, ED: Glucose-Capillary: 87 mg/dL (ref 70–99)

## 2023-01-16 LAB — GLUCOSE, CAPILLARY: Glucose-Capillary: 137 mg/dL — ABNORMAL HIGH (ref 70–99)

## 2023-01-16 LAB — COMPREHENSIVE METABOLIC PANEL
ALT: 16 U/L (ref 0–44)
AST: 21 U/L (ref 15–41)
Albumin: 3.7 g/dL (ref 3.5–5.0)
Alkaline Phosphatase: 60 U/L (ref 38–126)
Anion gap: 12 (ref 5–15)
BUN: 17 mg/dL (ref 8–23)
CO2: 26 mmol/L (ref 22–32)
Calcium: 8.5 mg/dL — ABNORMAL LOW (ref 8.9–10.3)
Chloride: 100 mmol/L (ref 98–111)
Creatinine, Ser: 1.03 mg/dL (ref 0.61–1.24)
GFR, Estimated: >60 mL/min (ref >60)
Glucose, Bld: 103 mg/dL — ABNORMAL HIGH (ref 70–99)
Potassium: 3.8 mmol/L (ref 3.5–5.1)
Sodium: 138 mmol/L (ref 135–145)
Total Bilirubin: 0.5 mg/dL (ref 0.3–1.2)
Total Protein: 6.8 g/dL (ref 6.5–8.1)

## 2023-01-16 LAB — TROPONIN I (HIGH SENSITIVITY): Troponin I (High Sensitivity): 7 ng/L (ref <18)

## 2023-01-16 LAB — BRAIN NATRIURETIC PEPTIDE: B Natriuretic Peptide: 42.3 pg/mL (ref 0.0–100.0)

## 2023-01-16 LAB — FERRITIN: Ferritin: 6 ng/mL — ABNORMAL LOW (ref 24–336)

## 2023-01-16 LAB — D-DIMER, QUANTITATIVE: D-Dimer, Quant: 0.54 ug{FEU}/mL — ABNORMAL HIGH (ref 0.00–0.50)

## 2023-01-16 LAB — POC OCCULT BLOOD, ED: Fecal Occult Bld: POSITIVE — AB

## 2023-01-16 MED ORDER — PANTOPRAZOLE SODIUM 40 MG IV SOLR
40.0000 mg | Freq: Once | INTRAVENOUS | Status: AC
  Administered 2023-01-16: 40 mg via INTRAVENOUS
  Filled 2023-01-16: qty 10

## 2023-01-16 MED ORDER — ATORVASTATIN CALCIUM 40 MG PO TABS
40.0000 mg | ORAL_TABLET | Freq: Every day | ORAL | Status: DC
  Administered 2023-01-17 – 2023-01-19 (×3): 40 mg via ORAL
  Filled 2023-01-16 (×3): qty 1

## 2023-01-16 MED ORDER — ACETAMINOPHEN 325 MG PO TABS: 650.0000 mg | ORAL_TABLET | Freq: Four times a day (QID) | ORAL | Status: DC | PRN

## 2023-01-16 MED ORDER — INSULIN GLARGINE-YFGN 100 UNIT/ML ~~LOC~~ SOLN
38.0000 [IU] | Freq: Every day | SUBCUTANEOUS | Status: DC
  Administered 2023-01-17: 38 [IU] via SUBCUTANEOUS
  Filled 2023-01-16 (×2): qty 0.38

## 2023-01-16 MED ORDER — METFORMIN HCL 500 MG PO TABS: 1000.0000 mg | ORAL_TABLET | Freq: Two times a day (BID) | ORAL | Status: DC

## 2023-01-16 MED ORDER — ASPIRIN 81 MG PO TBEC
81.0000 mg | DELAYED_RELEASE_TABLET | Freq: Every day | ORAL | Status: DC
  Administered 2023-01-17 – 2023-01-19 (×2): 81 mg via ORAL
  Filled 2023-01-16 (×2): qty 1

## 2023-01-16 MED ORDER — MELATONIN 5 MG PO TABS
10.0000 mg | ORAL_TABLET | Freq: Every day | ORAL | Status: DC
  Administered 2023-01-16 – 2023-01-18 (×3): 10 mg via ORAL
  Filled 2023-01-16 (×4): qty 2

## 2023-01-16 MED ORDER — FLUTICASONE FUROATE-VILANTEROL 200-25 MCG/ACT IN AEPB
1.0000 | INHALATION_SPRAY | Freq: Every day | RESPIRATORY_TRACT | Status: DC
  Administered 2023-01-17 – 2023-01-18 (×2): 1 via RESPIRATORY_TRACT
  Filled 2023-01-16 (×2): qty 28

## 2023-01-16 MED ORDER — SODIUM CHLORIDE 0.9 % IV SOLN: INTRAVENOUS | Status: DC

## 2023-01-16 MED ORDER — INSULIN ASPART 100 UNIT/ML IJ SOLN
0.0000 [IU] | Freq: Three times a day (TID) | INTRAMUSCULAR | Status: DC
  Administered 2023-01-17: 2 [IU] via SUBCUTANEOUS
  Administered 2023-01-17: 3 [IU] via SUBCUTANEOUS

## 2023-01-16 MED ORDER — INSULIN DEGLUDEC 100 UNIT/ML ~~LOC~~ SOPN: 38.0000 [IU] | PEN_INJECTOR | Freq: Every day | SUBCUTANEOUS | Status: DC

## 2023-01-16 MED ORDER — ACETAMINOPHEN 650 MG RE SUPP: 650.0000 mg | Freq: Four times a day (QID) | RECTAL | Status: DC | PRN

## 2023-01-16 MED ORDER — SODIUM CHLORIDE 0.9 % IV BOLUS
1000.0000 mL | Freq: Once | INTRAVENOUS | Status: AC
  Administered 2023-01-16: 1000 mL via INTRAVENOUS

## 2023-01-16 MED ORDER — TRAZODONE HCL 50 MG PO TABS: 25.0000 mg | ORAL_TABLET | Freq: Every evening | ORAL | Status: DC | PRN

## 2023-01-16 MED ORDER — CLOPIDOGREL BISULFATE 75 MG PO TABS
75.0000 mg | ORAL_TABLET | Freq: Every day | ORAL | Status: DC
  Administered 2023-01-17 – 2023-01-19 (×3): 75 mg via ORAL
  Filled 2023-01-16 (×3): qty 1

## 2023-01-16 MED ORDER — LOSARTAN POTASSIUM 50 MG PO TABS
50.0000 mg | ORAL_TABLET | Freq: Two times a day (BID) | ORAL | Status: DC
  Administered 2023-01-16 – 2023-01-19 (×6): 50 mg via ORAL
  Filled 2023-01-16 (×6): qty 1

## 2023-01-16 MED ORDER — ADULT MULTIVITAMIN W/MINERALS CH
1.0000 | ORAL_TABLET | Freq: Every day | ORAL | Status: DC
  Administered 2023-01-16 – 2023-01-19 (×4): 1 via ORAL
  Filled 2023-01-16 (×4): qty 1

## 2023-01-16 NOTE — ED Provider Notes (Signed)
[facility] AT [facility] Provider Note   CSN: [id number] Arrival date & time: 01/16/23  1302     History  Chief Complaint  Patient presents with   Near Syncope    Randy Jacobson is a 77 y.o. male.   Near Syncope     77 year old male with medical history significant for HTN, HLD, previous history of GI bleeding, history of peptic ulcer, anemia, DM2, iron deficiency on iron supplementation, peripheral arterial disease status post femoral-popliteal bypass graft on the left on aspirin and Plavix who presents to the emergency department with a near syncopal episode.  The patient states that he was at church and went from a seated to a standing position and felt immediately lightheaded.  He was found to have orthostatic hypotension on vitals with EMS noting a standing blood pressure of 80/40 and a sitting blood pressure of 140/62.  CBG with EMS was notably 141.  He was administered 500 cc of normal saline and route with EMS.  He denies any chest pain or shortness of breath.  He endorse some lightheadedness in the setting of his near syncopal episode however he has had no fatigue or lightheadedness or shortness of breath over the last few days.  He denies any melena or hematochezia.  He states that he has had problems with low hemoglobin in the past.  Home Medications Prior to Admission medications   Medication Sig Start Date End Date Taking? Authorizing Provider  aspirin EC 81 MG tablet Take 1 tablet (81 mg total) by mouth daily at 6 (six) AM. Swallow whole. 10/19/22  Yes Schuh, McKenzi P, PA-C  atorvastatin (LIPITOR) 40 MG tablet Take 40 mg by mouth daily.   Yes [provider]  budesonide-formoterol (SYMBICORT) 160-4.5 MCG/ACT inhaler Inhale 2 puffs into the lungs 2 (two) times daily. Rinse mouth with water after each use 11/16/21  Yes Particia Nearing, PA-C  clopidogrel (PLAVIX) 75 MG tablet Take 75 mg by mouth daily. 10/30/15  Yes [provider]  Emollient (EUCERIN) lotion Apply 1 Application topically as needed for dry skin.   Yes [provider]  losartan (COZAAR) 50 MG tablet Take 50 mg by mouth in the morning and at bedtime.   Yes [provider]  Melatonin 10 MG TABS Take 10 mg by mouth at bedtime.   Yes [provider]  metFORMIN (GLUCOPHAGE) 1000 MG tablet Take 1,000 mg by mouth 2 (two) times daily with a meal.   Yes [provider]  Multiple Vitamin (MULTIVITAMIN WITH MINERALS) TABS tablet Take 1 tablet by mouth daily.   Yes [provider]  pantoprazole (PROTONIX) 40 MG tablet Take 40 mg by mouth daily. 09/02/21  Yes [provider]  TRESIBA FLEXTOUCH 100 UNIT/ML FlexTouch Pen Inject 38 Units into the skin daily.   Yes [provider]  mineral oil-hydrophilic petrolatum (AQUAPHOR) ointment Apply 1 Application topically at bedtime.    [provider]      Allergies    Patient has no known allergies.    Review of Systems   Review of Systems  Cardiovascular:  Positive for near-syncope.  Neurological:  Positive for light-headedness.  All other systems reviewed and are negative.   Physical Exam Updated Vital Signs BP (!) 162/71   Pulse 81   Temp 98 F (36.7 C)   Resp 16   SpO2 100%  Physical Exam Vitals and nursing note reviewed. Exam conducted with a chaperone present.  Constitutional:  General: He is not in acute distress.    Appearance: He is well-developed.  HENT:     Head: Normocephalic and atraumatic.  Eyes:     Conjunctiva/sclera: Conjunctivae normal.  Cardiovascular:     Rate and Rhythm: Normal rate and regular rhythm.     Heart sounds: No murmur heard. Pulmonary:     Effort: Pulmonary effort is normal. No respiratory distress.     Breath sounds: Normal breath sounds.  Abdominal:     Palpations: Abdomen is soft.     Tenderness: There is no abdominal tenderness.  Genitourinary:    Comments: No melena or  hematochezia, fecal occult positive Musculoskeletal:        General: No swelling.     Cervical back: Neck supple.  Skin:    General: Skin is warm and dry.     Capillary Refill: Capillary refill takes less than 2 seconds.  Neurological:     Mental Status: He is alert.     GCS: GCS eye subscore is 4. GCS verbal subscore is 5. GCS motor subscore is 6.     Cranial Nerves: Cranial nerves 2-12 are intact.     Sensory: Sensation is intact.     Motor: Motor function is intact.  Psychiatric:        Mood and Affect: Mood normal.     ED Results / Procedures / Treatments   Labs (all labs ordered are listed, but only abnormal results are displayed) Labs Reviewed  CBC WITH DIFFERENTIAL/PLATELET - Abnormal; Notable for the following components:      Result Value   RBC 3.13 (*)    Hemoglobin 7.9 (*)    HCT 26.4 (*)    MCH 25.2 (*)    MCHC 29.9 (*)    RDW 16.6 (*)    Neutro Abs 7.8 (*)    Monocytes Absolute 1.1 (*)    Abs Immature Granulocytes 0.10 (*)    All other components within normal limits  COMPREHENSIVE METABOLIC PANEL - Abnormal; Notable for the following components:   Glucose, Bld 103 (*)    Calcium 8.5 (*)    All other components within normal limits  D-DIMER, QUANTITATIVE - Abnormal; Notable for the following components:   D-Dimer, Quant 0.54 (*)    All other components within normal limits  POC OCCULT BLOOD, ED - Abnormal; Notable for the following components:   Fecal Occult Bld POSITIVE (*)    All other components within normal limits  BRAIN NATRIURETIC PEPTIDE  CBG MONITORING, ED  TYPE AND SCREEN  TROPONIN I (HIGH SENSITIVITY)    EKG EKG Interpretation Date/Time:  Sunday January 16 2023 13:10:20 EDT Ventricular Rate:  72 PR Interval:  176 QRS Duration:  164 QT Interval:  446 QTC Calculation: 489 R Axis:   64  Text Interpretation: Sinus rhythm Right bundle branch block Confirmed by Ernie Avena ([id number]) on 01/16/2023 1:35:09 PM  Radiology DG Chest Port 1  View  Result Date: 01/16/2023 CLINICAL DATA:  Near syncopal episode. EXAM: PORTABLE CHEST 1 VIEW COMPARISON:  11/16/2021 FINDINGS: The heart size and mediastinal contours are within normal limits. Both lungs are clear. The visualized skeletal structures are unremarkable. IMPRESSION: No active disease. Electronically Signed   By: Danae Orleans M.D.   On: 01/16/2023 14:47    Procedures Procedures    Medications Ordered in ED Medications  sodium chloride 0.9 % bolus 1,000 mL (0 mLs Intravenous Stopped 01/16/23 1606)    And  0.9 %  sodium chloride infusion (  Intravenous New Bag/Given 01/16/23 1522)  pantoprazole (PROTONIX) injection 40 mg (40 mg Intravenous Given 01/16/23 1606)    Orthostatic Lying BP- Lying: 133/60 (no dizzy not light headed) Pulse- Lying: 76 Orthostatic Sitting BP- Sitting: 152/58 (no dizzy not light headed) Pulse- Sitting: 84 Orthostatic Standing at 0 minutes BP- Standing at 0 minutes: 164/58 (not dizzy not light headed) Pulse- Standing at 0 minutes: 85 Orthostatic Standing at 3 minutes BP- Standing at 3 minutes: 141/53 Pulse- Standing at 3 minutes: 86  ED Course/ Medical Decision Making/ A&P Clinical Course as of 01/16/23 1806  Sun Jan 16, 2023  1551 Fecal Occult Blood, POC(!): POSITIVE [JL]    Clinical Course User Index [JL] Ernie Avena, MD                                 Medical Decision Making Amount and/or Complexity of Data Reviewed Labs: ordered. Decision-making details documented in ED Course. Radiology: ordered.  Risk Prescription drug management. Decision regarding hospitalization.    77 year old male with medical history significant for HTN, HLD, previous history of GI bleeding, history of peptic ulcer, anemia, DM2, iron deficiency on iron supplementation, peripheral arterial disease status post femoral-popliteal bypass graft on the left on aspirin and Plavix who presents to the emergency department with a near syncopal episode.  The patient  states that he was at church and went from a seated to a standing position and felt immediately lightheaded.  He was found to have orthostatic hypotension on vitals with EMS noting a standing blood pressure of 80/40 and a sitting blood pressure of 140/62.  CBG with EMS was notably 141.  He was administered 500 cc of normal saline and route with EMS.  He denies any chest pain or shortness of breath.  He endorse some lightheadedness in the setting of his near syncopal episode however he has had no fatigue or lightheadedness or shortness of breath over the last few days.  He denies any melena or hematochezia.  He states that he has had problems with low hemoglobin in the past.  Medical Decision Making:   Randy Jacobson is a 77 y.o. male who presented to the ED today with a syncopal episode detailed above.    Patient placed on continuous vitals and telemetry monitoring while in ED which was reviewed periodically.  Complete initial physical exam performed, notably the patient  was CTAB, abd without tenderness, fecal occult positive rectal exam, no melena or hematochezia.    Reviewed and confirmed nursing documentation for past medical history, family history, social history.    Initial Assessment:   With the patient's presentation of syncope, most likely diagnosis is orthostatic hypotension vs vasovagal episode. Other diagnoses were considered including anemia, GI bleed, (but not limited to) arrythmogenic syncope, valvular abnormality, PE, aortic dissection. These are considered less likely due to history of present illness and physical exam findings.  The patient does have a hx of PAD and is status post pop-fem bipass and is on aspirin and plavix. This is most consistent with an acute life/limb threatening illness complicated by underlying chronic conditions.   FAINT score can be utilized in syncope to predict cardiac event in the appropriate patient. Utilization of BNP/Troponin testing can predict cardiac  events within 30 days.   This patient is a candidate for a faint score as she has a baseline low pretest probability for cardiac etiology. Will proceed with BNP/Troponin testing as below. If  negative, then patient without risk factor for acute cardiac event causing her syncope.    Initial Plan:   Screening labs including CBC and Metabolic panel to evaluate for infectious or metabolic etiology of disease.  CXR to evaluate for structural/infectious intrathoracic pathology.  EKG to evaluate for cardiac pathology. Utilization of FAINT scoring detailed above.  Objective evaluation as below reviewed after administration of IVF/Telemetry monitoring Trop, dimer, BNP Occult blood testing  Initial Study Results:   Laboratory  All laboratory results reviewed without evidence of clinically relevant pathology.   Exceptions include: fecal occult positive, dimer normal after age adjustment   EKG EKG was reviewed independently. Rate, rhythm, axis, intervals all examined and without medically relevant abnormality. ST segments without concerns for elevations.    Radiology:  All images reviewed independently. Agree with radiology report at this time.   DG Chest Port 1 View  Result Date: 01/16/2023 CLINICAL DATA:  Near syncopal episode. EXAM: PORTABLE CHEST 1 VIEW COMPARISON:  11/16/2021 FINDINGS: The heart size and mediastinal contours are within normal limits. Both lungs are clear. The visualized skeletal structures are unremarkable. IMPRESSION: No active disease. Electronically Signed   By: Danae Orleans M.D.   On: 01/16/2023 14:47   VAS US CAROTID  Result Date: 12/30/2022 Carotid Arterial Duplex Study Patient Name:  AVA FERRARIO  Date of Exam:   12/30/2022 Medical Rec #: [id number]      Accession #:    [id number] Date of Birth: 12-Sep-1945      Patient Gender: M Patient Age:   45 years Exam Location:  [facility] Procedure:      VAS US CAROTID Referring Phys: Cristal Deer DICKSON  --------------------------------------------------------------------------------  Indications:                              Carotid artery disease and Visual                                           disturbance left eye. Limitations                               Today's exam was limited due to the                                           high bifurcation of the carotid,                                           heavy calcification and the resulting                                           shadowing and the patient's                                           respiratory variation. Pre-Surgical Evaluation & Surgical  Stenosis at RICA, ICA and Bifurcation Correlation                               significantly calcified. ICA appears                                           normal past the stenosis. Anatomy on                                           the right is within normal                                           limits.Right bifurcation is located                                           near the mandible. Performing Technologist: Elita Quick RVT  Examination Guidelines: A complete evaluation includes B-mode imaging, spectral Doppler, color Doppler, and power Doppler as needed of all accessible portions of each vessel. Bilateral testing is considered an integral part of a complete examination. Limited examinations for reoccurring indications may be performed as noted.  Right Carotid Findings: +----------+-------+--------+--------+--------------------------------+--------+           PSV    EDV cm/sStenosisPlaque Description              Comments           cm/s                                                            +----------+-------+--------+--------+--------------------------------+--------+ CCA Prox  55     11              heterogenous                             +----------+-------+--------+--------+--------------------------------+--------+ CCA Mid   60     14               heterogenous                             +----------+-------+--------+--------+--------------------------------+--------+ CCA Distal68     13              calcific and heterogenous                +----------+-------+--------+--------+--------------------------------+--------+ ICA Prox  562    135     80-99%  calcific, homogeneous and                                                 irregular                                +----------+-------+--------+--------+--------------------------------+--------+  ICA Mid   100    17              calcific                                 +----------+-------+--------+--------+--------------------------------+--------+ ICA Distal62     16                                                       +----------+-------+--------+--------+--------------------------------+--------+ ECA       149    33                                              NWV      +----------+-------+--------+--------+--------------------------------+--------+ +----------+--------+-------+----------------+-------------------+           PSV cm/sEDV cmsDescribe        Arm Pressure (mmHG) +----------+--------+-------+----------------+-------------------+ [id number]     2      Multiphasic, [id number]                 +----------+--------+-------+----------------+-------------------+ +---------+--------+--+--------+--+---------+ VertebralPSV cm/s82EDV cm/s19Antegrade +---------+--------+--+--------+--+---------+  Left Carotid Findings: +----------+--------+--------+--------+------------------+--------+           PSV cm/sEDV cm/sStenosisPlaque DescriptionComments +----------+--------+--------+--------+------------------+--------+ CCA Prox  172     32                                         +----------+--------+--------+--------+------------------+--------+ CCA Mid   163     31                                          +----------+--------+--------+--------+------------------+--------+ CCA Distal150     27              heterogenous               +----------+--------+--------+--------+------------------+--------+ ICA Prox  250     48      40-59%  heterogenous               +----------+--------+--------+--------+------------------+--------+ ICA Mid   122     27                                         +----------+--------+--------+--------+------------------+--------+ ICA Distal143     34                                         +----------+--------+--------+--------+------------------+--------+ ECA       190     17                                         +----------+--------+--------+--------+------------------+--------+ +----------+--------+--------+----------------+-------------------+           PSV cm/sEDV cm/sDescribe  Arm Pressure (mmHG) +----------+--------+--------+----------------+-------------------+ Subclavian241     4       Multiphasic, [id number]                 +----------+--------+--------+----------------+-------------------+ +---------+--------+--+--------+--+---------+ VertebralPSV cm/s33EDV cm/s11Antegrade +---------+--------+--+--------+--+---------+   Summary: Right Carotid: Velocities in the right ICA are consistent with a 80-99%                stenosis. Left Carotid: Velocities in the left ICA are consistent with a 40-59% stenosis. Vertebrals:  Bilateral vertebral arteries demonstrate antegrade flow. Subclavians: Normal flow hemodynamics were seen in bilateral subclavian              arteries. *See table(s) above for measurements and observations.  Electronically signed by Waverly Ferrari MD on 12/30/2022 at 8:37:39 AM.    Final       Consults: Case discussed with Dr. Ewing Schlein of [facility].    Final Assessment and Plan:   Pt with positive orthostatic vitals on scene with EMS, feeling better after fluid resuscitation (got 500cc with EMS and 1L NaCl  here). Chronically anemic, normocytic anemia present with a Hgb of 7.9 (recent baseline appears to be 7-8). Discussed case with [facility] on-call, considered admission for observation in the setting of above presentation.   Discussed the care of the patient with Dr. Ewing Schlein of [facility] who stated if the patient can stop his Plavix, he feels more comfortable with the patient following up as a scheduled outpatient, can bump up his appointment outpatient as needed with Dr. Marca Ancona.  He recommended we reach out to vascular surgery to discuss this. Discussed with Dr. Lenell Antu of on-call vascular surgery who reviewed the patient's chart.  Because the patient has had a bare-metal stent placed, he would prefer that the patient stays on Plavix for now.  Dr. Ewing Schlein recommended hospitalist admission for observation, recheck of the patient's hemoglobin, consideration for EGD to rule out peptic ulcer disease as an etiology of a potential slow bleed causing a chronic anemia which could be contributing to the patient's presentation.  Hospitalist medicine consulted for admission.   Clinical Impression:  1. Near syncope   2. Orthostatic hypotension   3. Fecal occult blood test positive   4. Symptomatic anemia      Admit       Final Clinical Impression(s) / ED Diagnoses Final diagnoses:  Near syncope  Orthostatic hypotension  Fecal occult blood test positive  Symptomatic anemia    Rx / DC Orders ED Discharge Orders     None         Ernie Avena, MD 01/16/23 1806

## 2023-01-16 NOTE — ED Triage Notes (Signed)
Patient BIB EMS from church c/o near syncope. Ortho standing 80/40, sitting 140/62, CBG 141. 18gLAC, NS.

## 2023-01-16 NOTE — ED Notes (Signed)
ED TO INPATIENT HANDOFF REPORT  ED Nurse Name and Phone #: Shea Evans, [phone]  S Name/Age/Gender Randy Jacobson 77 y.o. male Room/Bed: 007C/007C  Code Status   Code Status: Full Code  Home/SNF/Other Home Patient oriented to: self, place, time, and situation Is this baseline? Yes   Triage Complete: Triage complete  Chief Complaint Orthostatic hypotension [I95.1]  Triage Note Patient BIB EMS from church c/o near syncope. Ortho standing 80/40, sitting 140/62, CBG 141. 18gLAC, NS.    Allergies No Known Allergies  Level of Care/Admitting Diagnosis ED Disposition     ED Disposition  Admit   Condition  --   Comment  Hospital Area: [facility] [[id number]]  Level of Care: Med-Surg [16]  May place patient in observation at [facility] or [facility] if equivalent level of care is available:: Yes  Covid Evaluation: Asymptomatic - no recent exposure (last 10 days) testing not required  Diagnosis: Orthostatic hypotension [458.0.ICD-9-CM]  Admitting Physician: Lahoma Crocker [[id number]]  Attending Physician: Lahoma Crocker [[id number]]          B Medical/Surgery History Past Medical History:  Diagnosis Date   Diabetes mellitus without complication (HCC)    Dysrhythmia    chronic RBBB; bradycardia s/p atropine following sheath removal, 3.8 second sinus pause 10/11/22   Hypertension    Peripheral artery disease [facility])    Past Surgical History:  Procedure Laterality Date   "stents in both my legs"     ABDOMINAL AORTOGRAM W/LOWER EXTREMITY N/A 10/11/2022   Procedure: ABDOMINAL AORTOGRAM W/LOWER EXTREMITY;  Surgeon: Chuck Hint, MD;  Location: [facility] INVASIVE CV LAB;  Service: Cardiovascular;  Laterality: N/A;   ENDARTERECTOMY FEMORAL Left 10/15/2022   Procedure: ENDARTERECTOMY OF LEFT ILIOFEMORAL ARTERY;  Surgeon: Chuck Hint, MD;  Location: [facility] OR;  Service: Vascular;  Laterality: Left;   FEMORAL-POPLITEAL BYPASS GRAFT Left 10/15/2022    Procedure: LEFT FEMORAL-BELOW KNEE POPLITEAL ARTERY BYPASS WITH VEIN HARVEST OF LEFT GREATER SAPHENOUS VEIN;  Surgeon: Chuck Hint, MD;  Location: [facility] OR;  Service: Vascular;  Laterality: Left;   GIVENS CAPSULE STUDY N/A 04/10/2017   Procedure: GIVENS CAPSULE STUDY;  Surgeon: Charlott Rakes, MD;  Location: [facility] ENDOSCOPY;  Service: Endoscopy;  Laterality: N/A;   IR GENERIC HISTORICAL  08/26/2015   IR RADIOLOGIST EVAL & MGMT 08/26/2015 Malachy Moan, MD [facility] INTERV RAD   IR GENERIC HISTORICAL  04/21/2016   IR RADIOLOGIST EVAL & MGMT 04/21/2016 Malachy Moan, MD [facility] INTERV RAD   [facility] ANGIOPLASTY Left 10/15/2022   Procedure: PATCH ANGIOPLASTY USING XENOSURE BIOLOGIC PATCH 1CMX14CM;  Surgeon: Chuck Hint, MD;  Location: [facility] OR;  Service: Vascular;  Laterality: Left;   PERIPHERAL VASCULAR INTERVENTION  10/11/2022   Procedure: PERIPHERAL VASCULAR INTERVENTION;  Surgeon: Chuck Hint, MD;  Location: [facility] INVASIVE CV LAB;  Service: Cardiovascular;;     A IV Location/Drains/Wounds Patient Lines/Drains/Airways Status     Active Line/Drains/Airways     Name Placement date Placement time Site Days   Peripheral IV 01/16/23 18 G Anterior;Distal;Left;Upper Arm 01/16/23  1309  Arm  less than 1   Wound / Incision (Open or Dehisced) Toe (Comment  which one) Left ulcer --  --  Toe (Comment  which one)  --            Intake/Output Last 24 hours No intake or output data in the 24 hours ending 01/16/23 1828  Labs/Imaging Results for orders placed or performed during the hospital encounter of 01/16/23 (  from the past 48 hour(s))  CBC WITH DIFFERENTIAL     Status: Abnormal   Collection Time: 01/16/23  1:25 PM  Result Value Ref Range   WBC 10.1 4.0 - 10.5 K/uL   RBC 3.13 (L) 4.22 - 5.81 MIL/uL   Hemoglobin 7.9 (L) 13.0 - 17.0 g/dL   HCT 41.3 (L) 24.4 - 01.0 %   MCV 84.3 80.0 - 100.0 fL   MCH 25.2 (L) 26.0 - 34.0 pg   MCHC 29.9 (L) 30.0 - 36.0 g/dL   RDW 27.2 (H)  53.6 - 15.5 %   Platelets 262 150 - 400 K/uL   nRBC 0.0 0.0 - 0.2 %   Neutrophils Relative % 76 %   Neutro Abs 7.8 (H) 1.7 - 7.7 K/uL   Lymphocytes Relative 9 %   Lymphs Abs 0.9 0.7 - 4.0 K/uL   Monocytes Relative 11 %   Monocytes Absolute 1.1 (H) 0.1 - 1.0 K/uL   Eosinophils Relative 2 %   Eosinophils Absolute 0.2 0.0 - 0.5 K/uL   Basophils Relative 1 %   Basophils Absolute 0.1 0.0 - 0.1 K/uL   Immature Granulocytes 1 %   Abs Immature Granulocytes 0.10 (H) 0.00 - 0.07 K/uL    Comment: Performed at [facility], [address], [place], [place] [address]  Comprehensive metabolic panel     Status: Abnormal   Collection Time: 01/16/23  1:25 PM  Result Value Ref Range   Sodium 138 135 - 145 mmol/L   Potassium 3.8 3.5 - 5.1 mmol/L   Chloride 100 98 - 111 mmol/L   CO2 26 22 - 32 mmol/L   Glucose, Bld 103 (H) 70 - 99 mg/dL    Comment: Glucose reference range applies only to samples taken after fasting for at least 8 hours.   BUN 17 8 - 23 mg/dL   Creatinine, Ser 4.74 0.61 - 1.24 mg/dL   Calcium 8.5 (L) 8.9 - 10.3 mg/dL   Total Protein 6.8 6.5 - 8.1 g/dL   Albumin 3.7 3.5 - 5.0 g/dL   AST 21 15 - 41 U/L   ALT 16 0 - 44 U/L   Alkaline Phosphatase 60 38 - 126 U/L   Total Bilirubin 0.5 0.3 - 1.2 mg/dL   GFR, Estimated >25 >95 mL/min    Comment: (NOTE) Calculated using the CKD-EPI Creatinine Equation (2021)    Anion gap 12 5 - 15    Comment: Performed at [facility], [address], [place], [place] [address]  Troponin I (High Sensitivity)     Status: None   Collection Time: 01/16/23  1:25 PM  Result Value Ref Range   Troponin I (High Sensitivity) 7 <18 ng/L    Comment: (NOTE) Elevated high sensitivity troponin I (hsTnI) values and significant  changes across serial measurements may suggest ACS but many other  chronic and acute conditions are known to elevate hsTnI results.  Refer to the "Links" section for chest pain algorithms and additional  guidance. Performed  at [facility], [address], [place], [place] [address]   Brain natriuretic peptide     Status: None   Collection Time: 01/16/23  1:25 PM  Result Value Ref Range   B Natriuretic Peptide 42.3 0.0 - 100.0 pg/mL    Comment: Performed at [facility], [address] [place], [place] [address]  D-dimer, quantitative     Status: Abnormal   Collection Time: 01/16/23  1:25 PM  Result  Value Ref Range   D-Dimer, Quant 0.54 (H) 0.00 - 0.50 ug/mL-FEU    Comment: (NOTE) At the manufacturer cut-off value of 0.5 g/mL FEU, this assay has a negative predictive value of 95-100%.This assay is intended for use in conjunction with a clinical pretest probability (PTP) assessment model to exclude pulmonary embolism (PE) and deep venous thrombosis (DVT) in outpatients suspected of PE or DVT. Results should be correlated with clinical presentation. Performed at [facility], [address] [place], [place] [address]   CBG monitoring, ED     Status: None   Collection Time: 01/16/23  3:12 PM  Result Value Ref Range   Glucose-Capillary 87 70 - 99 mg/dL    Comment: Glucose reference range applies only to samples taken after fasting for at least 8 hours.  POC occult blood, ED     Status: Abnormal   Collection Time: 01/16/23  3:49 PM  Result Value Ref Range   Fecal Occult Bld POSITIVE (A) NEGATIVE   DG Chest Port 1 View  Result Date: 01/16/2023 CLINICAL DATA:  Near syncopal episode. EXAM: PORTABLE CHEST 1 VIEW COMPARISON:  11/16/2021 FINDINGS: The heart size and mediastinal contours are within normal limits. Both lungs are clear. The visualized skeletal structures are unremarkable. IMPRESSION: No active disease. Electronically Signed   By: Danae Orleans M.D.   On: 01/16/2023 14:47    Pending Labs Unresulted Labs (From admission, onward)     Start     Ordered   01/17/23 0500  Basic metabolic panel  Tomorrow morning,   R        01/16/23 1816   01/17/23 0500  CBC  Tomorrow morning,    R        01/16/23 1816   01/16/23 1806  Type and screen [facility]  Once,   STAT       Comments: [facility]   Question:  Release to patient  Answer:  Immediate   01/16/23 1805            Vitals/Pain Today's Vitals   01/16/23 1630 01/16/23 1645 01/16/23 1700 01/16/23 1713  BP: (!) 180/66 (!) 191/62 (!) 162/71   Pulse: 89 100 81   Resp: 17 19 16    Temp:    98 F (36.7 C)  TempSrc:      SpO2: 100% 100% 100%   PainSc:        Isolation Precautions No active isolations  Medications Medications  sodium chloride 0.9 % bolus 1,000 mL (0 mLs Intravenous Stopped 01/16/23 1606)    And  0.9 %  sodium chloride infusion ( Intravenous New Bag/Given 01/16/23 1522)  aspirin EC tablet 81 mg (has no administration in time range)  atorvastatin (LIPITOR) tablet 40 mg (has no administration in time range)  fluticasone furoate-vilanterol (BREO ELLIPTA) 200-25 MCG/ACT 1 puff (has no administration in time range)  clopidogrel (PLAVIX) tablet 75 mg (has no administration in time range)  losartan (COZAAR) tablet 50 mg (has no administration in time range)  melatonin tablet 10 mg (has no administration in time range)  metFORMIN (GLUCOPHAGE) tablet 1,000 mg (has no administration in time range)  multivitamin with minerals tablet 1 tablet (has no administration in time range)  insulin aspart (novoLOG) injection 0-15 Units (has no administration in time range)  acetaminophen (TYLENOL) tablet 650 mg (has no administration in time range)    Or  acetaminophen (TYLENOL) suppository 650 mg (has no administration in time range)  traZODone (DESYREL)  tablet 25 mg (has no administration in time range)  insulin glargine-yfgn (SEMGLEE) injection 38 Units (has no administration in time range)  pantoprazole (PROTONIX) injection 40 mg (40 mg Intravenous Given 01/16/23 1606)    Mobility walks with person assist     Focused Assessments Neuro Assessment Handoff:  Swallow  screen pass? Yes          Neuro Assessment: Within Defined Limits Neuro Checks:      Has TPA been given? No If patient is a Neuro Trauma and patient is going to OR before floor call report to 4N Charge nurse: [phone] or [phone]   R Recommendations: See Admitting Provider Note  Report given to:   Additional Notes:

## 2023-01-16 NOTE — H&P (Signed)
History and Physical    Randy Jacobson:[id number] DOB: Aug 24, 1945 DOA: 01/16/2023  PCP: Benita Stabile, MD  Patient coming from: [facility] via [place]  I have personally briefly reviewed patient's old medical records in [facility]  Chief Complaint: Near syncope with positive orthostatics  HPI: Randy Jacobson is a 77 y.o. 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  He presented to the emergency department today with a near syncopal episode he was at church and went from a seated to a standing position and felt immediately lightheaded.  Vital signs obtained by EMS found him to be orthostatic with a standing blood pressure of 80/40 and a sitting blood pressure of 140/62.  Capillary blood glucose at that time was 141.  He received 500 mL of normal saline en route.  On arrival to the ER he reveals denied any chest pain, shortness of breath, nausea or vomiting.  He did have some lightheadedness but no fatigue or shortness of breath over the past few days.  He reports no melena or hematochezia but he has had low hemoglobin in the past.  ED Course: Hemoglobin found to be 7.9.  Blood pressures ranging from 143/52 to 193/77 in the emergency department.  O2 sats were 100%.  He is afebrile.  Heart rate ranging from 100-73.  Respirations 16-20.  He was found to be fecal occult blood positive in the emergency department.  Case discussed with Dr. Ewing Schlein of [facility] as well as Dr. Lenell Antu from vascular surgery.  Dr. Juanetta Gosling states that because the patient had a bare-metal stent placed recently he would like for him to stay on the aspirin and Plavix.  GI recommended hospitalist admission for observation to recheck the patient's hemoglobin in the morning and consider EGD  to rule out peptic ulcer disease versus colonoscopy hospitalization.  Review of Systems: As per HPI otherwise all other systems reviewed and  negative.   Past Medical History:  Diagnosis Date   Diabetes mellitus without complication (HCC)    Dysrhythmia    chronic RBBB; bradycardia s/p atropine following sheath removal, 3.8 second sinus pause 10/11/22   Hypertension    Peripheral artery disease [facility])     Past Surgical History:  Procedure Laterality Date   "stents in both my legs"     ABDOMINAL AORTOGRAM W/LOWER EXTREMITY N/A 10/11/2022   Procedure: ABDOMINAL AORTOGRAM W/LOWER EXTREMITY;  Surgeon: Chuck Hint, MD;  Location: [facility] INVASIVE CV LAB;  Service: Cardiovascular;  Laterality: N/A;   ENDARTERECTOMY FEMORAL Left 10/15/2022   Procedure: ENDARTERECTOMY OF LEFT ILIOFEMORAL ARTERY;  Surgeon: Chuck Hint, MD;  Location: [facility] OR;  Service: Vascular;  Laterality: Left;   FEMORAL-POPLITEAL BYPASS GRAFT Left 10/15/2022   Procedure: LEFT FEMORAL-BELOW KNEE POPLITEAL ARTERY BYPASS WITH VEIN HARVEST OF LEFT GREATER SAPHENOUS VEIN;  Surgeon: Chuck Hint, MD;  Location: [facility] OR;  Service: Vascular;  Laterality: Left;   GIVENS CAPSULE STUDY N/A 04/10/2017   Procedure: GIVENS CAPSULE STUDY;  Surgeon: Charlott Rakes, MD;  Location: [facility] ENDOSCOPY;  Service: Endoscopy;  Laterality: N/A;   IR GENERIC HISTORICAL  08/26/2015   IR RADIOLOGIST EVAL & MGMT 08/26/2015 Malachy Moan, MD [facility] INTERV RAD   IR GENERIC HISTORICAL  04/21/2016   IR RADIOLOGIST EVAL & MGMT 04/21/2016 Heath  Archer Asa, MD [facility] INTERV RAD   [facility] ANGIOPLASTY Left 10/15/2022   Procedure: PATCH ANGIOPLASTY USING XENOSURE BIOLOGIC PATCH 1CMX14CM;  Surgeon: Chuck Hint, MD;  Location: [facility] OR;  Service: Vascular;  Laterality: Left;   PERIPHERAL VASCULAR INTERVENTION  10/11/2022   Procedure: PERIPHERAL VASCULAR INTERVENTION;  Surgeon: Chuck Hint, MD;  Location: [facility] INVASIVE CV LAB;  Service:  Cardiovascular;;    Social History   Social History Narrative   Not on file     reports that he quit smoking about 29 years ago. His smoking use included cigarettes. He started smoking about 65 years ago. He has a 36 pack-year smoking history. He has never used smokeless tobacco. He reports current alcohol use. He reports that he does not use drugs.  No Known Allergies  Family History  Problem Relation Age of Onset   Diabetes Brother      Prior to Admission medications   Medication Sig Start Date End Date Taking? Authorizing Provider  aspirin EC 81 MG tablet Take 1 tablet (81 mg total) by mouth daily at 6 (six) AM. Swallow whole. 10/19/22  Yes Schuh, McKenzi P, PA-C  atorvastatin (LIPITOR) 40 MG tablet Take 40 mg by mouth daily.   Yes [provider]  budesonide-formoterol (SYMBICORT) 160-4.5 MCG/ACT inhaler Inhale 2 puffs into the lungs 2 (two) times daily. Rinse mouth with water after each use 11/16/21  Yes Particia Nearing, PA-C  clopidogrel (PLAVIX) 75 MG tablet Take 75 mg by mouth daily. 10/30/15  Yes [provider]  Emollient (EUCERIN) lotion Apply 1 Application topically as needed for dry skin.   Yes [provider]  losartan (COZAAR) 50 MG tablet Take 50 mg by mouth in the morning and at bedtime.   Yes [provider]  Melatonin 10 MG TABS Take 10 mg by mouth at bedtime.   Yes [provider]  metFORMIN (GLUCOPHAGE) 1000 MG tablet Take 1,000 mg by mouth 2 (two) times daily with a meal.   Yes [provider]  Multiple Vitamin (MULTIVITAMIN WITH MINERALS) TABS tablet Take 1 tablet by mouth daily.   Yes [provider]  pantoprazole (PROTONIX) 40 MG tablet Take 40 mg by mouth daily. 09/02/21  Yes [provider]  TRESIBA FLEXTOUCH 100 UNIT/ML FlexTouch Pen Inject 38 Units into the skin daily.   Yes [provider]  mineral oil-hydrophilic petrolatum (AQUAPHOR) ointment Apply 1 Application topically  at bedtime.    [provider]    Physical Exam:  Constitutional: NAD, calm, comfortable Vitals:   01/16/23 1630 01/16/23 1645 01/16/23 1700 01/16/23 1713  BP: (!) 180/66 (!) 191/62 (!) 162/71   Pulse: 89 100 81   Resp: 17 19 16    Temp:    98 F (36.7 C)  TempSrc:      SpO2: 100% 100% 100%    Eyes: PERRL, lids and conjunctivae normal ENMT: Mucous membranes are moist. Posterior pharynx clear of any exudate or lesions.Normal dentition.  Neck: normal, supple, no masses, no thyromegaly Respiratory: clear to auscultation bilaterally, no wheezing, no crackles. Normal respiratory effort. No accessory muscle use.  Cardiovascular: Regular rate and rhythm, no murmurs / rubs / gallops. No extremity edema. 2+ pedal pulses. No carotid bruits.  Abdomen: no tenderness, no masses palpated. No hepatosplenomegaly. Bowel sounds positive.  Musculoskeletal: no clubbing / cyanosis. No joint deformity upper and lower extremities. Good ROM, no contractures. Normal muscle tone.  Skin: no rashes, lesions, ulcers. No induration Neurologic: CN 2-12 grossly intact. Sensation  intact, DTR normal. Strength 5/5 in all 4.  Psychiatric: Normal judgment and insight. Alert and oriented x 3. Normal mood.    Labs on Admission: I have personally reviewed following labs and imaging studies  CBC: Recent Labs  Lab 01/16/23 1325  WBC 10.1  NEUTROABS 7.8*  HGB 7.9*  HCT 26.4*  MCV 84.3  PLT 262   Basic Metabolic Panel: Recent Labs  Lab 01/16/23 1325  NA 138  K 3.8  CL 100  CO2 26  GLUCOSE 103*  BUN 17  CREATININE 1.03  CALCIUM 8.5*    Liver Function Tests: Recent Labs  Lab 01/16/23 1325  AST 21  ALT 16  ALKPHOS 60  BILITOT 0.5  PROT 6.8  ALBUMIN 3.7   CBG: Recent Labs  Lab 01/16/23 1512  GLUCAP 87    Urine analysis:    Component Value Date/Time   COLORURINE YELLOW 10/15/2022 0635   APPEARANCEUR CLEAR 10/15/2022 0635   LABSPEC 1.025 10/15/2022 0635   PHURINE 6.0 10/15/2022  0635   GLUCOSEU NEGATIVE 10/15/2022 0635   HGBUR NEGATIVE 10/15/2022 0635   BILIRUBINUR NEGATIVE 10/15/2022 0635   KETONESUR NEGATIVE 10/15/2022 0635   PROTEINUR NEGATIVE 10/15/2022 0635   NITRITE NEGATIVE 10/15/2022 0635   LEUKOCYTESUR NEGATIVE 10/15/2022 0635    Radiological Exams on Admission: DG Chest Port 1 View  Result Date: 01/16/2023 CLINICAL DATA:  Near syncopal episode. EXAM: PORTABLE CHEST 1 VIEW COMPARISON:  11/16/2021 FINDINGS: The heart size and mediastinal contours are within normal limits. Both lungs are clear. The visualized skeletal structures are unremarkable. IMPRESSION: No active disease. Electronically Signed   By: Danae Orleans M.D.   On: 01/16/2023 14:47    EKG: Independently reviewed.  Sinus rhythm with normal axes and intervals.  Assessment/Plan Principal Problem:   Orthostatic hypotension Active Problems:   GI bleeding   PAD (peripheral artery disease) (HCC)   Diabetes mellitus, type 2 (HCC)   Iron deficiency anemia due to chronic blood loss   Hyperlipidemia    1.  Orthostatic hypotension Likely related to volume contraction from chronic blood loss with a hemoglobin of 7.8.  At this point transfusion was thought not to be medically necessary and patient has been typed and screened.  Patient received a 1 L bolus of fluid in the emergency department and will continue to review to see fluids at 125 mL/h for the next 24 hours.  Will recheck orthostatics on arrival to the floor and in AM.  2.  GI bleeding: Patient with heme positive stools and anemia.  He certainly is a candidate for colonoscopy and upper endoscopy.  Dr. Pati Gallo his gastroenterologist who he saw a few days ago is on-call will be notified of the patient's admission.  Further management per Dr. Pati Gallo  3.  Peripheral artery disease:  Patient with recent surgery in July.  Vascular request that we continue Plavix and aspirin.  4.  Diabetes mellitus type 2:  Will continue with his home medications  check fingerstick blood glucoses before meals and at bedtime and cover with sliding scale insulin if necessary.  5.  Iron deficiency anemia due to chronic blood loss: Last set of iron studies in our system was in December 2018.  Given recent low hemoglobins present since at least July 2024 and heme positive stool we will add iron studies for documentation purposes as I suspect patient may require a blood transfusion this admission.  6.  Hyperlipidemia: Recent lipid profile done in July 2024 was acceptable.  Continue atorvastatin  DVT prophylaxis: Sequential compression devices Code Status: Full code Family Communication: Is his own person no family present at the time of admission. Disposition Plan: Likely home Consults called: GI and vascular per ED Admission status: Observation   Lahoma Crocker MD FACP [facility] Pager [phone]  How to contact the [facility] Attending or Consulting provider 7A - 7P or covering provider during after hours 7P -7A, for this patient?  Check the care team in [facility] and look for a) attending/consulting [facility] provider listed and b) the [facility] team listed Log into [email or web address] and use [place]'s universal password to access. If you do not have the password, please contact the hospital operator. Locate the [facility] provider you are looking for under [facility] and page to a number that you can be directly reached. If you still have difficulty reaching the provider, please page the [facility] (Director on Call) for the Hospitalists listed on amion for assistance.  If 7PM-7AM, please contact night-coverage [email or web address] Password [email or web address]  01/16/2023, 6:18 PM

## 2023-01-17 DIAGNOSIS — K5710 Diverticulosis of small intestine without perforation or abscess without bleeding: Secondary | ICD-10-CM | POA: Diagnosis not present

## 2023-01-17 DIAGNOSIS — Z833 Family history of diabetes mellitus: Secondary | ICD-10-CM | POA: Diagnosis not present

## 2023-01-17 DIAGNOSIS — Z79899 Other long term (current) drug therapy: Secondary | ICD-10-CM | POA: Diagnosis not present

## 2023-01-17 DIAGNOSIS — I951 Orthostatic hypotension: Secondary | ICD-10-CM | POA: Diagnosis present

## 2023-01-17 DIAGNOSIS — E119 Type 2 diabetes mellitus without complications: Secondary | ICD-10-CM | POA: Diagnosis not present

## 2023-01-17 DIAGNOSIS — I4510 Unspecified right bundle-branch block: Secondary | ICD-10-CM | POA: Diagnosis present

## 2023-01-17 DIAGNOSIS — K3189 Other diseases of stomach and duodenum: Secondary | ICD-10-CM | POA: Diagnosis present

## 2023-01-17 DIAGNOSIS — D509 Iron deficiency anemia, unspecified: Secondary | ICD-10-CM | POA: Diagnosis not present

## 2023-01-17 DIAGNOSIS — K5731 Diverticulosis of large intestine without perforation or abscess with bleeding: Secondary | ICD-10-CM | POA: Diagnosis present

## 2023-01-17 DIAGNOSIS — E1151 Type 2 diabetes mellitus with diabetic peripheral angiopathy without gangrene: Secondary | ICD-10-CM | POA: Diagnosis present

## 2023-01-17 DIAGNOSIS — Z7984 Long term (current) use of oral hypoglycemic drugs: Secondary | ICD-10-CM | POA: Diagnosis not present

## 2023-01-17 DIAGNOSIS — K648 Other hemorrhoids: Secondary | ICD-10-CM | POA: Diagnosis present

## 2023-01-17 DIAGNOSIS — Z860101 Personal history of adenomatous and serrated colon polyps: Secondary | ICD-10-CM | POA: Diagnosis not present

## 2023-01-17 DIAGNOSIS — Z87891 Personal history of nicotine dependence: Secondary | ICD-10-CM | POA: Diagnosis not present

## 2023-01-17 DIAGNOSIS — I10 Essential (primary) hypertension: Secondary | ICD-10-CM | POA: Diagnosis present

## 2023-01-17 DIAGNOSIS — Z7982 Long term (current) use of aspirin: Secondary | ICD-10-CM | POA: Diagnosis not present

## 2023-01-17 DIAGNOSIS — K5730 Diverticulosis of large intestine without perforation or abscess without bleeding: Secondary | ICD-10-CM | POA: Diagnosis not present

## 2023-01-17 DIAGNOSIS — K922 Gastrointestinal hemorrhage, unspecified: Secondary | ICD-10-CM | POA: Diagnosis not present

## 2023-01-17 DIAGNOSIS — Z7951 Long term (current) use of inhaled steroids: Secondary | ICD-10-CM | POA: Diagnosis not present

## 2023-01-17 DIAGNOSIS — D649 Anemia, unspecified: Secondary | ICD-10-CM | POA: Diagnosis not present

## 2023-01-17 DIAGNOSIS — R195 Other fecal abnormalities: Secondary | ICD-10-CM | POA: Diagnosis not present

## 2023-01-17 DIAGNOSIS — K5711 Diverticulosis of small intestine without perforation or abscess with bleeding: Secondary | ICD-10-CM | POA: Diagnosis present

## 2023-01-17 DIAGNOSIS — E785 Hyperlipidemia, unspecified: Secondary | ICD-10-CM | POA: Diagnosis present

## 2023-01-17 DIAGNOSIS — D500 Iron deficiency anemia secondary to blood loss (chronic): Secondary | ICD-10-CM | POA: Diagnosis not present

## 2023-01-17 DIAGNOSIS — R001 Bradycardia, unspecified: Secondary | ICD-10-CM | POA: Diagnosis present

## 2023-01-17 DIAGNOSIS — Z7902 Long term (current) use of antithrombotics/antiplatelets: Secondary | ICD-10-CM | POA: Diagnosis not present

## 2023-01-17 DIAGNOSIS — Z95820 Peripheral vascular angioplasty status with implants and grafts: Secondary | ICD-10-CM | POA: Diagnosis not present

## 2023-01-17 DIAGNOSIS — D62 Acute posthemorrhagic anemia: Secondary | ICD-10-CM | POA: Diagnosis present

## 2023-01-17 DIAGNOSIS — K254 Chronic or unspecified gastric ulcer with hemorrhage: Secondary | ICD-10-CM | POA: Diagnosis present

## 2023-01-17 DIAGNOSIS — I739 Peripheral vascular disease, unspecified: Secondary | ICD-10-CM | POA: Diagnosis not present

## 2023-01-17 DIAGNOSIS — E11649 Type 2 diabetes mellitus with hypoglycemia without coma: Secondary | ICD-10-CM | POA: Diagnosis not present

## 2023-01-17 LAB — GLUCOSE, CAPILLARY
Glucose-Capillary: 138 mg/dL — ABNORMAL HIGH (ref 70–99)
Glucose-Capillary: 171 mg/dL — ABNORMAL HIGH (ref 70–99)
Glucose-Capillary: 104 mg/dL — ABNORMAL HIGH (ref 70–99)
Glucose-Capillary: 69 mg/dL — ABNORMAL LOW (ref 70–99)
Glucose-Capillary: 86 mg/dL (ref 70–99)

## 2023-01-17 LAB — BASIC METABOLIC PANEL
Anion gap: 9 (ref 5–15)
BUN: 10 mg/dL (ref 8–23)
CO2: 25 mmol/L (ref 22–32)
Calcium: 8.2 mg/dL — ABNORMAL LOW (ref 8.9–10.3)
Chloride: 106 mmol/L (ref 98–111)
Creatinine, Ser: 0.81 mg/dL (ref 0.61–1.24)
GFR, Estimated: >60 mL/min (ref >60)
Glucose, Bld: 99 mg/dL (ref 70–99)
Potassium: 3.7 mmol/L (ref 3.5–5.1)
Sodium: 140 mmol/L (ref 135–145)

## 2023-01-17 LAB — CBC
HCT: 24.9 % — ABNORMAL LOW (ref 39.0–52.0)
Hemoglobin: 7.5 g/dL — ABNORMAL LOW (ref 13.0–17.0)
MCH: 25.7 pg — ABNORMAL LOW (ref 26.0–34.0)
MCHC: 30.1 g/dL (ref 30.0–36.0)
MCV: 85.3 fL (ref 80.0–100.0)
Platelets: 272 10*3/uL (ref 150–400)
RBC: 2.92 MIL/uL — ABNORMAL LOW (ref 4.22–5.81)
RDW: 16.5 % — ABNORMAL HIGH (ref 11.5–15.5)
WBC: 6.3 10*3/uL (ref 4.0–10.5)
nRBC: 0 % (ref 0.0–0.2)

## 2023-01-17 MED ORDER — PANTOPRAZOLE SODIUM 40 MG IV SOLR
40.0000 mg | Freq: Two times a day (BID) | INTRAVENOUS | Status: DC
  Administered 2023-01-17 – 2023-01-19 (×5): 40 mg via INTRAVENOUS
  Filled 2023-01-17 (×5): qty 10

## 2023-01-17 MED ORDER — PEG 3350-KCL-NA BICARB-NACL 420 G PO SOLR
4000.0000 mL | Freq: Once | ORAL | Status: AC
  Administered 2023-01-17: 4000 mL via ORAL
  Filled 2023-01-17: qty 4000

## 2023-01-17 MED ORDER — SODIUM CHLORIDE 0.9 % IV SOLN: INTRAVENOUS | Status: DC

## 2023-01-17 MED ORDER — FERROUS SULFATE 325 (65 FE) MG PO TABS
325.0000 mg | ORAL_TABLET | Freq: Every day | ORAL | Status: DC
  Administered 2023-01-18 – 2023-01-19 (×2): 325 mg via ORAL
  Filled 2023-01-17 (×2): qty 1

## 2023-01-17 MED ORDER — SODIUM CHLORIDE 0.9 % IV SOLN
100.0000 mg | Freq: Once | INTRAVENOUS | Status: AC
  Administered 2023-01-17: 100 mg via INTRAVENOUS
  Filled 2023-01-17: qty 5

## 2023-01-17 NOTE — Consult Note (Signed)
[facility] Consult  Referring Provider: ER Primary Care Physician:  Benita Stabile, MD Primary Gastroenterologist: Dr. Marca Ancona  Reason for Consultation: Anemia  HPI: Randy Jacobson is a 77 y.o. male was brought to the ER with changes suggestive of near syncope and positive orthostatics.  Patient reports intermittent episodes of dizziness since Left iliofemoral endarterectomy,Bovine pericardial patch angioplasty,Left femoral to below-knee popliteal artery bypass with nonreversed translocated saphenous vein graft in 10/2022.  He states intermittently he gets dizzy so much so that he has stopped driving.  Yesterday he was at church when he felt dizzy, states he did not lose consciousness but the people at church thought he had a blank look on his face and EMS was called.  Patient states he was on iron supplementation for multiple months, which she stopped about a month ago. He has not noted any black stool or bloody bowel movements. He denies coffee-ground emesis or vomiting blood. He has been on aspirin and Plavix. He has back pain but states he takes up to 5 pills of Tylenol a day and denies use of NSAIDs or Goody powders. Patient states he is on pantoprazole once a day, denies acid reflux or heartburn, difficulty swallowing, pain on swallowing, unintentional weight loss or loss of appetite. He denies abdominal or rectal pain. He was being worked up for anemia with plans for outpatient EGD and colonoscopy. He was last seen in the office in 2020.  Previous GI workup: Colonoscopy 03/22/2019: 2 tubular adenomas, 1 sessile serrated adenoma removed, diverticulosis in sigmoid and descending, repeat recommended in 3 years(however because of requirement for vascular surgery he has not been able to complete his recommended surveillance colonoscopy in 2023)  Colonoscopy 01/2017: 2 sessile serrated adenomas, 2 tubular adenomas were removed, diverticulosis in sigmoid and descending  EGD  01/2017: H. pylori associated gastritis and few superficial gastric ulcers He was treated for H. pylori, stool for H. pylori antigen negative on 04/22/2017  Capsule endoscopy 04/10/2017: Unremarkable  Past Medical History:  Diagnosis Date   Diabetes mellitus without complication (HCC)    Dysrhythmia    chronic RBBB; bradycardia s/p atropine following sheath removal, 3.8 second sinus pause 10/11/22   Hypertension    Peripheral artery disease [facility])     Past Surgical History:  Procedure Laterality Date   "stents in both my legs"     ABDOMINAL AORTOGRAM W/LOWER EXTREMITY N/A 10/11/2022   Procedure: ABDOMINAL AORTOGRAM W/LOWER EXTREMITY;  Surgeon: Chuck Hint, MD;  Location: [facility] INVASIVE CV LAB;  Service: Cardiovascular;  Laterality: N/A;   ENDARTERECTOMY FEMORAL Left 10/15/2022   Procedure: ENDARTERECTOMY OF LEFT ILIOFEMORAL ARTERY;  Surgeon: Chuck Hint, MD;  Location: [facility] OR;  Service: Vascular;  Laterality: Left;   FEMORAL-POPLITEAL BYPASS GRAFT Left 10/15/2022   Procedure: LEFT FEMORAL-BELOW KNEE POPLITEAL ARTERY BYPASS WITH VEIN HARVEST OF LEFT GREATER SAPHENOUS VEIN;  Surgeon: Chuck Hint, MD;  Location: [facility] OR;  Service: Vascular;  Laterality: Left;   GIVENS CAPSULE STUDY N/A 04/10/2017   Procedure: GIVENS CAPSULE STUDY;  Surgeon: Charlott Rakes, MD;  Location: [facility] ENDOSCOPY;  Service: Endoscopy;  Laterality: N/A;   IR GENERIC HISTORICAL  08/26/2015   IR RADIOLOGIST EVAL & MGMT 08/26/2015 Malachy Moan, MD [facility] INTERV RAD   IR GENERIC HISTORICAL  04/21/2016   IR RADIOLOGIST EVAL & MGMT 04/21/2016 Malachy Moan, MD [facility] INTERV RAD   [facility] ANGIOPLASTY Left 10/15/2022   Procedure: PATCH ANGIOPLASTY USING XENOSURE BIOLOGIC PATCH 1CMX14CM;  Surgeon: Chuck Hint, MD;  Location:  [facility] OR;  Service: Vascular;  Laterality: Left;   PERIPHERAL VASCULAR INTERVENTION  10/11/2022   Procedure: PERIPHERAL VASCULAR INTERVENTION;  Surgeon: Chuck Hint, MD;   Location: [facility] INVASIVE CV LAB;  Service: Cardiovascular;;    Prior to Admission medications   Medication Sig Start Date End Date Taking? Authorizing Provider  aspirin EC 81 MG tablet Take 1 tablet (81 mg total) by mouth daily at 6 (six) AM. Swallow whole. 10/19/22  Yes Schuh, McKenzi P, PA-C  atorvastatin (LIPITOR) 40 MG tablet Take 40 mg by mouth daily.   Yes [provider]  budesonide-formoterol (SYMBICORT) 160-4.5 MCG/ACT inhaler Inhale 2 puffs into the lungs 2 (two) times daily. Rinse mouth with water after each use 11/16/21  Yes Particia Nearing, PA-C  clopidogrel (PLAVIX) 75 MG tablet Take 75 mg by mouth daily. 10/30/15  Yes [provider]  Emollient (EUCERIN) lotion Apply 1 Application topically as needed for dry skin.   Yes [provider]  losartan (COZAAR) 50 MG tablet Take 50 mg by mouth in the morning and at bedtime.   Yes [provider]  Melatonin 10 MG TABS Take 10 mg by mouth at bedtime.   Yes [provider]  metFORMIN (GLUCOPHAGE) 1000 MG tablet Take 1,000 mg by mouth 2 (two) times daily with a meal.   Yes [provider]  Multiple Vitamin (MULTIVITAMIN WITH MINERALS) TABS tablet Take 1 tablet by mouth daily.   Yes [provider]  pantoprazole (PROTONIX) 40 MG tablet Take 40 mg by mouth daily. 09/02/21  Yes [provider]  TRESIBA FLEXTOUCH 100 UNIT/ML FlexTouch Pen Inject 38 Units into the skin daily.   Yes [provider]  mineral oil-hydrophilic petrolatum (AQUAPHOR) ointment Apply 1 Application topically at bedtime.    [provider]    Current Facility-Administered Medications  Medication Dose Route Frequency Provider Last Rate Last Admin   0.9 %  sodium chloride infusion   Intravenous Continuous Ernie Avena, MD 125 mL/hr at 01/17/23 0425 Infusion Verify at 01/17/23 0425   acetaminophen (TYLENOL) tablet 650 mg  650 mg Oral Q6H PRN Lahoma Crocker, MD       Or    acetaminophen (TYLENOL) suppository 650 mg  650 mg Rectal Q6H PRN Lahoma Crocker, MD       aspirin EC tablet 81 mg  81 mg Oral Q0600 Lahoma Crocker, MD   81 mg at 01/17/23 0509   atorvastatin (LIPITOR) tablet 40 mg  40 mg Oral Daily Lahoma Crocker, MD       clopidogrel (PLAVIX) tablet 75 mg  75 mg Oral Daily Lahoma Crocker, MD       [START ON 01/18/2023] ferrous sulfate tablet 325 mg  325 mg Oral Q breakfast Adhikari, Willia Craze, MD       fluticasone furoate-vilanterol (BREO ELLIPTA) 200-25 MCG/ACT 1 puff  1 puff Inhalation Daily Lahoma Crocker, MD   1 puff at 01/17/23 0829   insulin aspart (novoLOG) injection 0-15 Units  0-15 Units Subcutaneous TID WC Lahoma Crocker, MD       insulin glargine-yfgn [facility]) injection 38 Units  38 Units Subcutaneous Daily Estill Batten W, RPH       iron sucrose (VENOFER) 100 mg in sodium chloride 0.9 % 100 mL IVPB  100 mg Intravenous Once Burnadette Pop, MD       losartan (COZAAR) tablet 50 mg  50 mg Oral BID Lahoma Crocker, MD   50 mg at 01/16/23  2134   melatonin tablet 10 mg  10 mg Oral QHS Lahoma Crocker, MD   10 mg at 01/16/23 2134   multivitamin with minerals tablet 1 tablet  1 tablet Oral Daily Lahoma Crocker, MD   1 tablet at 01/16/23 2134   pantoprazole (PROTONIX) injection 40 mg  40 mg Intravenous Q12H Adhikari, Willia Craze, MD       polyethylene glycol-electrolytes (NuLYTELY) solution 4,000 mL  4,000 mL Oral Once Kerin Salen, MD       traZODone (DESYREL) tablet 25 mg  25 mg Oral QHS PRN Lahoma Crocker, MD        Allergies as of 01/16/2023   (No Known Allergies)    Family History  Problem Relation Age of Onset   Diabetes Brother     Social History   Socioeconomic History   Marital status: Significant Other    Spouse name: Not on file   Number of children: Not on file   Years of education: Not on file   Highest education level: Not on file  Occupational History   Not on file  Tobacco Use   Smoking status:  Former    Current packs/day: 0.00    Average packs/day: 1 pack/day for 36.0 years (36.0 ttl pk-yrs)    Types: Cigarettes    Start date: 11/20/1957    Quit date: 11/20/1993    Years since quitting: 29.1   Smokeless tobacco: Never  Vaping Use   Vaping status: Never Used  Substance and Sexual Activity   Alcohol use: Yes    Alcohol/week: 0.0 standard drinks of alcohol    Comment: occ   Drug use: Never   Sexual activity: Not on file  Other Topics Concern   Not on file  Social History Narrative   Not on file   Social Determinants of Health   Financial Resource Strain: Low Risk  (01/29/2022)   Received from [facility], [facility]   Overall Financial Resource Strain (CARDIA)    Difficulty of Paying Living Expenses: Not hard at all  Food Insecurity: No Food Insecurity (01/16/2023)   Hunger Vital Sign    Worried About Running Out of Food in the Last Year: Never true    Ran Out of Food in the Last Year: Never true  Transportation Needs: No Transportation Needs (01/16/2023)   PRAPARE - Administrator, Civil Service (Medical): No    Lack of Transportation (Non-Medical): No  Physical Activity: Not on file  Stress: No Stress Concern Present (01/29/2022)   Received from [facility], [facility] of Occupational Health - Occupational Stress Questionnaire    Feeling of Stress : Not at all  Social Connections: Not on file  Intimate Partner Violence: Not At Risk (01/16/2023)   Humiliation, Afraid, Rape, and Kick questionnaire    Fear of Current or Ex-Partner: No    Emotionally Abused: No    Physically Abused: No    Sexually Abused: No    Review of Systems: Positive for: As per HPI  Physical Exam: Vital signs in last 24 hours: Temp:  [97.5 F (36.4 C)-98.4 F (36.9 C)] 98.1 F (36.7 C) (10/07 0741) Pulse Rate:  [73-100] 80 (10/07 0830) Resp:  [16-22] 17 (10/07 0830) BP: (143-193)/(50-91) 174/76 (10/07 0741) SpO2:  [95 %-100 %] 97 %  (10/07 0830)    General:   Alert,  Well-developed, well-nourished, pleasant and cooperative in NAD Head:  Normocephalic and atraumatic. Eyes:  Sclera clear, no icterus.  Mild pallor Ears:  Normal auditory acuity. Nose:  No deformity, discharge,  or lesions. Mouth:  No deformity or lesions.  Oropharynx pink & moist. Neck:  Supple; no masses or thyromegaly. Lungs:  Clear throughout to auscultation.   No wheezes, crackles, or rhonchi. No acute distress. Heart:  Regular rate and rhythm; no murmurs, clicks, rubs,  or gallops. Extremities:  Without clubbing or edema. Neurologic:  Alert and  oriented x4;  grossly normal neurologically. Skin:  Intact without significant lesions or rashes. Psych:  Alert and cooperative. Normal mood and affect. Abdomen:  Soft, nontender and nondistended. No masses, hepatosplenomegaly or hernias noted. Normal bowel sounds, without guarding, and without rebound.         Lab Results: Recent Labs    01/16/23 1325  WBC 10.1  HGB 7.9*  HCT 26.4*  PLT 262   BMET Recent Labs    01/16/23 1325  NA 138  K 3.8  CL 100  CO2 26  GLUCOSE 103*  BUN 17  CREATININE 1.03  CALCIUM 8.5*   LFT Recent Labs    01/16/23 1325  PROT 6.8  ALBUMIN 3.7  AST 21  ALT 16  ALKPHOS 60  BILITOT 0.5   PT/INR No results for input(s): "LABPROT", "INR" in the last 72 hours.  Studies/Results: DG Chest Port 1 View  Result Date: 01/16/2023 CLINICAL DATA:  Near syncopal episode. EXAM: PORTABLE CHEST 1 VIEW COMPARISON:  11/16/2021 FINDINGS: The heart size and mediastinal contours are within normal limits. Both lungs are clear. The visualized skeletal structures are unremarkable. IMPRESSION: No active disease. Electronically Signed   By: Danae Orleans M.D.   On: 01/16/2023 14:47    Impression: Severe symptomatic anemia without obvious melena or hematochezia  History of multiple adenomas removed in 2020 History of H. pylori related gastritis and gastric ulcer, status  posttreatment for H. Pylori  On aspirin and Plavix, as per vascular surgery, patient to stay on aspirin and Plavix due to bare-metal stent placed recently in July 2024  Multiple comorbidities-diabetes, dysrhythmia, hypertension, peripheral arterial disease  Plan: Clear liquid diet, n.p.o. postmidnight, colonoscopic prep today Colonoscopy +/- EGD+/- capsule endoscopy tomorrow The risks and the benefits of the procedure were discussed with the patient in details. He understands and verbalizes consent.   LOS: 0 days   Kerin Salen, MD  01/17/2023, 8:45 AM

## 2023-01-17 NOTE — TOC CM/SW Note (Addendum)
Transition of Care [facility]) - Inpatient Brief Assessment   Patient Details  Name: Randy Jacobson MRN: [id number] Date of Birth: [birth year]  Transition of Care [facility]) CM/SW Contact:    Ronny Bacon, RN Phone Number: 01/17/2023, 8:53 AM   Clinical Narrative:  Patient due for colonoscopy tomorrow. Girlfriend will pick patient up when discharged.   Transition of Care Asessment: Insurance and Status: Insurance coverage has been reviewed Patient has primary care physician: Yes Home environment has been reviewed: from home alone Prior level of function:: independent Prior/Current Home Services: No current home services Social Determinants of Health Reivew: SDOH reviewed no interventions necessary Readmission risk has been reviewed: Yes Transition of care needs: no transition of care needs at this time

## 2023-01-17 NOTE — Progress Notes (Signed)
PROGRESS NOTE  GARDINER SYDOW  ZDG:[id number] DOB: November 06, 1945 DOA: 01/16/2023 PCP: Benita Stabile, MD   Brief Narrative: Patient is a 77 year old male with history of hypertension, hyperlipidemia, GI bleed due to peptic ulcer, chronic anemia due to blood loss, labs after, peripheral artery disease status post femoral-popliteal bypass on the left, currently on aspirin, Plavix presented to the emergency department with near syncope episode, lightheadedness.  EMS found him to be orthostatic, given IV fluid en route to ED.  On presentation, hemoglobin found to be 7.9.  Blood pressure stable/slightly hypertensive.  FOBT was positive.  GI consulted.  Plan for EGD/colonoscopy tomorrow  Assessment & Plan:  Principal Problem:   Orthostatic hypotension Active Problems:   GI bleeding   PAD (peripheral artery disease) (HCC)   Diabetes mellitus, type 2 (HCC)   Iron deficiency anemia due to chronic blood loss   Hyperlipidemia   Presyncope/orthostatic hypotension: Likely from anemia, acute on chronic blood loss.  Orthostatic when checked.  Started on IV fluids.  He is actually hypertensive this morning.  Will discontinue IV fluids.  Will recheck orthostatic vitals after EGD/colonoscopy  GI bleed/acute on chronic blood loss.  No hematochezia or melena.  History of adenoma, aspirated gastritis/gastric ulcer.  FOBT positive.  Currently on IV Protonix.  Hemoglobin in the range of 7.  Found to be deficient in iron.  Will give IV iron.  Continue oral iron supplementation on discharge. GI planning for EGD/colonoscopy and possible capsule endoscopy tomorrow  History of peripheral artery disease: On aspirin and Plavix.  Recent history of femoropopliteal bypass, stent placement.  Case was discussed with vascular surgery and recommending to continue aspirin and Plavix  Diabetes type 2: Currently on sliding scale, monitor blood sugars.  Takes metformin, Tresiba at home.  Hyperlipidemia: On atorvastatin.  History of  hypertension: Takes losartan at home.  He was initially hypertensive but currently hypertensive.  Resume          DVT prophylaxis:SCDs Start: 01/16/23 1814     Code Status: Full Code  Family Communication: None at the bedside  Patient status:Obs  Patient is from :Home  Anticipated discharge VZ:DGLO  Estimated DC date:1-2 days   Consultants: GI  Procedures:None  Antimicrobials:  Anti-infectives (From admission, onward)    None       Subjective: Patient seen and examined at bedside today.  He looks comfortable during my evaluation.  Denies any lightheadedness or dizziness during my evaluation.  Does not complain of any abdomen, nausea or vomiting.  No bloody or black bowel movements  Objective: Vitals:   01/16/23 2038 01/17/23 0015 01/17/23 0521 01/17/23 0741  BP: (!) 160/91 (!) 160/54 (!) 152/66 (!) 174/76  Pulse: 83 80 75 80  Resp: 18 18 18 16   Temp: 98.4 F (36.9 C) 98.4 F (36.9 C) 97.7 F (36.5 C) 98.1 F (36.7 C)  TempSrc: Oral Oral Oral Oral  SpO2: 99% 98% 96% 98%    Intake/Output Summary (Last 24 hours) at 01/17/2023 0746 Last data filed at 01/17/2023 0425 Gross per 24 hour  Intake 1630.96 ml  Output --  Net 1630.96 ml   There were no vitals filed for this visit.  Examination:  General exam: Overall comfortable, not in distress HEENT: PERRL Respiratory system:  no wheezes or crackles  Cardiovascular system: S1 & S2 heard, RRR.  Gastrointestinal system: Abdomen is nondistended, soft and nontender. Central nervous system: Alert and oriented Extremities: No edema, no clubbing ,no cyanosis Skin: No rashes, no ulcers,no icterus  Data Reviewed: I have personally reviewed following labs and imaging studies  CBC: Recent Labs  Lab 01/16/23 1325  WBC 10.1  NEUTROABS 7.8*  HGB 7.9*  HCT 26.4*  MCV 84.3  PLT 262   Basic Metabolic Panel: Recent Labs  Lab 01/16/23 1325  NA 138  K 3.8  CL 100  CO2 26  GLUCOSE 103*  BUN 17   CREATININE 1.03  CALCIUM 8.5*     No results found for this or any previous visit (from the past 240 hour(s)).   Radiology Studies: DG Chest Port 1 View  Result Date: 01/16/2023 CLINICAL DATA:  Near syncopal episode. EXAM: PORTABLE CHEST 1 VIEW COMPARISON:  11/16/2021 FINDINGS: The heart size and mediastinal contours are within normal limits. Both lungs are clear. The visualized skeletal structures are unremarkable. IMPRESSION: No active disease. Electronically Signed   By: Danae Orleans M.D.   On: 01/16/2023 14:47    Scheduled Meds:  aspirin EC  81 mg Oral Q0600   atorvastatin  40 mg Oral Daily   clopidogrel  75 mg Oral Daily   fluticasone furoate-vilanterol  1 puff Inhalation Daily   insulin aspart  0-15 Units Subcutaneous TID WC   insulin glargine-yfgn  38 Units Subcutaneous Daily   losartan  50 mg Oral BID   melatonin  10 mg Oral QHS   metFORMIN  1,000 mg Oral BID WC   multivitamin with minerals  1 tablet Oral Daily   Continuous Infusions:  sodium chloride 125 mL/hr at 01/17/23 0425     LOS: 0 days   Burnadette Pop, MD [facility] P10/10/2022, 7:46 AM

## 2023-01-17 NOTE — Care Management Obs Status (Signed)
MEDICARE OBSERVATION STATUS NOTIFICATION   Patient Details  Name: Randy Jacobson MRN: [id number] Date of Birth: [birth year]   Medicare Observation Status Notification Given:  Yes    Ronny Bacon, RN 01/17/2023, 8:08 AM

## 2023-01-17 NOTE — H&P (View-Only) (Signed)
[facility] Consult  Referring Provider: ER Primary Care Physician:  Benita Stabile, MD Primary Gastroenterologist: Dr. Marca Ancona  Reason for Consultation: Anemia  HPI: Randy Jacobson is a 77 y.o. male was brought to the ER with changes suggestive of near syncope and positive orthostatics.  Patient reports intermittent episodes of dizziness since Left iliofemoral endarterectomy,Bovine pericardial patch angioplasty,Left femoral to below-knee popliteal artery bypass with nonreversed translocated saphenous vein graft in 10/2022.  He states intermittently he gets dizzy so much so that he has stopped driving.  Yesterday he was at church when he felt dizzy, states he did not lose consciousness but the people at church thought he had a blank look on his face and EMS was called.  Patient states he was on iron supplementation for multiple months, which she stopped about a month ago. He has not noted any black stool or bloody bowel movements. He denies coffee-ground emesis or vomiting blood. He has been on aspirin and Plavix. He has back pain but states he takes up to 5 pills of Tylenol a day and denies use of NSAIDs or Goody powders. Patient states he is on pantoprazole once a day, denies acid reflux or heartburn, difficulty swallowing, pain on swallowing, unintentional weight loss or loss of appetite. He denies abdominal or rectal pain. He was being worked up for anemia with plans for outpatient EGD and colonoscopy. He was last seen in the office in 2020.  Previous GI workup: Colonoscopy 03/22/2019: 2 tubular adenomas, 1 sessile serrated adenoma removed, diverticulosis in sigmoid and descending, repeat recommended in 3 years(however because of requirement for vascular surgery he has not been able to complete his recommended surveillance colonoscopy in 2023)  Colonoscopy 01/2017: 2 sessile serrated adenomas, 2 tubular adenomas were removed, diverticulosis in sigmoid and descending  EGD  01/2017: H. pylori associated gastritis and few superficial gastric ulcers He was treated for H. pylori, stool for H. pylori antigen negative on 04/22/2017  Capsule endoscopy 04/10/2017: Unremarkable  Past Medical History:  Diagnosis Date   Diabetes mellitus without complication (HCC)    Dysrhythmia    chronic RBBB; bradycardia s/p atropine following sheath removal, 3.8 second sinus pause 10/11/22   Hypertension    Peripheral artery disease [facility])     Past Surgical History:  Procedure Laterality Date   "stents in both my legs"     ABDOMINAL AORTOGRAM W/LOWER EXTREMITY N/A 10/11/2022   Procedure: ABDOMINAL AORTOGRAM W/LOWER EXTREMITY;  Surgeon: Chuck Hint, MD;  Location: [facility] INVASIVE CV LAB;  Service: Cardiovascular;  Laterality: N/A;   ENDARTERECTOMY FEMORAL Left 10/15/2022   Procedure: ENDARTERECTOMY OF LEFT ILIOFEMORAL ARTERY;  Surgeon: Chuck Hint, MD;  Location: [facility] OR;  Service: Vascular;  Laterality: Left;   FEMORAL-POPLITEAL BYPASS GRAFT Left 10/15/2022   Procedure: LEFT FEMORAL-BELOW KNEE POPLITEAL ARTERY BYPASS WITH VEIN HARVEST OF LEFT GREATER SAPHENOUS VEIN;  Surgeon: Chuck Hint, MD;  Location: [facility] OR;  Service: Vascular;  Laterality: Left;   GIVENS CAPSULE STUDY N/A 04/10/2017   Procedure: GIVENS CAPSULE STUDY;  Surgeon: Charlott Rakes, MD;  Location: [facility] ENDOSCOPY;  Service: Endoscopy;  Laterality: N/A;   IR GENERIC HISTORICAL  08/26/2015   IR RADIOLOGIST EVAL & MGMT 08/26/2015 Malachy Moan, MD [facility] INTERV RAD   IR GENERIC HISTORICAL  04/21/2016   IR RADIOLOGIST EVAL & MGMT 04/21/2016 Malachy Moan, MD [facility] INTERV RAD   [facility] ANGIOPLASTY Left 10/15/2022   Procedure: PATCH ANGIOPLASTY USING XENOSURE BIOLOGIC PATCH 1CMX14CM;  Surgeon: Chuck Hint, MD;  Location:  [facility] OR;  Service: Vascular;  Laterality: Left;   PERIPHERAL VASCULAR INTERVENTION  10/11/2022   Procedure: PERIPHERAL VASCULAR INTERVENTION;  Surgeon: Chuck Hint, MD;   Location: [facility] INVASIVE CV LAB;  Service: Cardiovascular;;    Prior to Admission medications   Medication Sig Start Date End Date Taking? Authorizing Provider  aspirin EC 81 MG tablet Take 1 tablet (81 mg total) by mouth daily at 6 (six) AM. Swallow whole. 10/19/22  Yes Schuh, McKenzi P, PA-C  atorvastatin (LIPITOR) 40 MG tablet Take 40 mg by mouth daily.   Yes [provider]  budesonide-formoterol (SYMBICORT) 160-4.5 MCG/ACT inhaler Inhale 2 puffs into the lungs 2 (two) times daily. Rinse mouth with water after each use 11/16/21  Yes Particia Nearing, PA-C  clopidogrel (PLAVIX) 75 MG tablet Take 75 mg by mouth daily. 10/30/15  Yes [provider]  Emollient (EUCERIN) lotion Apply 1 Application topically as needed for dry skin.   Yes [provider]  losartan (COZAAR) 50 MG tablet Take 50 mg by mouth in the morning and at bedtime.   Yes [provider]  Melatonin 10 MG TABS Take 10 mg by mouth at bedtime.   Yes [provider]  metFORMIN (GLUCOPHAGE) 1000 MG tablet Take 1,000 mg by mouth 2 (two) times daily with a meal.   Yes [provider]  Multiple Vitamin (MULTIVITAMIN WITH MINERALS) TABS tablet Take 1 tablet by mouth daily.   Yes [provider]  pantoprazole (PROTONIX) 40 MG tablet Take 40 mg by mouth daily. 09/02/21  Yes [provider]  TRESIBA FLEXTOUCH 100 UNIT/ML FlexTouch Pen Inject 38 Units into the skin daily.   Yes [provider]  mineral oil-hydrophilic petrolatum (AQUAPHOR) ointment Apply 1 Application topically at bedtime.    [provider]    Current Facility-Administered Medications  Medication Dose Route Frequency Provider Last Rate Last Admin   0.9 %  sodium chloride infusion   Intravenous Continuous Ernie Avena, MD 125 mL/hr at 01/17/23 0425 Infusion Verify at 01/17/23 0425   acetaminophen (TYLENOL) tablet 650 mg  650 mg Oral Q6H PRN Lahoma Crocker, MD       Or    acetaminophen (TYLENOL) suppository 650 mg  650 mg Rectal Q6H PRN Lahoma Crocker, MD       aspirin EC tablet 81 mg  81 mg Oral Q0600 Lahoma Crocker, MD   81 mg at 01/17/23 0509   atorvastatin (LIPITOR) tablet 40 mg  40 mg Oral Daily Lahoma Crocker, MD       clopidogrel (PLAVIX) tablet 75 mg  75 mg Oral Daily Lahoma Crocker, MD       [START ON 01/18/2023] ferrous sulfate tablet 325 mg  325 mg Oral Q breakfast Adhikari, Willia Craze, MD       fluticasone furoate-vilanterol (BREO ELLIPTA) 200-25 MCG/ACT 1 puff  1 puff Inhalation Daily Lahoma Crocker, MD   1 puff at 01/17/23 0829   insulin aspart (novoLOG) injection 0-15 Units  0-15 Units Subcutaneous TID WC Lahoma Crocker, MD       insulin glargine-yfgn [facility]) injection 38 Units  38 Units Subcutaneous Daily Estill Batten W, RPH       iron sucrose (VENOFER) 100 mg in sodium chloride 0.9 % 100 mL IVPB  100 mg Intravenous Once Burnadette Pop, MD       losartan (COZAAR) tablet 50 mg  50 mg Oral BID Lahoma Crocker, MD   50 mg at 01/16/23  2134   melatonin tablet 10 mg  10 mg Oral QHS Lahoma Crocker, MD   10 mg at 01/16/23 2134   multivitamin with minerals tablet 1 tablet  1 tablet Oral Daily Lahoma Crocker, MD   1 tablet at 01/16/23 2134   pantoprazole (PROTONIX) injection 40 mg  40 mg Intravenous Q12H Adhikari, Willia Craze, MD       polyethylene glycol-electrolytes (NuLYTELY) solution 4,000 mL  4,000 mL Oral Once Kerin Salen, MD       traZODone (DESYREL) tablet 25 mg  25 mg Oral QHS PRN Lahoma Crocker, MD        Allergies as of 01/16/2023   (No Known Allergies)    Family History  Problem Relation Age of Onset   Diabetes Brother     Social History   Socioeconomic History   Marital status: Significant Other    Spouse name: Not on file   Number of children: Not on file   Years of education: Not on file   Highest education level: Not on file  Occupational History   Not on file  Tobacco Use   Smoking status:  Former    Current packs/day: 0.00    Average packs/day: 1 pack/day for 36.0 years (36.0 ttl pk-yrs)    Types: Cigarettes    Start date: 11/20/1957    Quit date: 11/20/1993    Years since quitting: 29.1   Smokeless tobacco: Never  Vaping Use   Vaping status: Never Used  Substance and Sexual Activity   Alcohol use: Yes    Alcohol/week: 0.0 standard drinks of alcohol    Comment: occ   Drug use: Never   Sexual activity: Not on file  Other Topics Concern   Not on file  Social History Narrative   Not on file   Social Determinants of Health   Financial Resource Strain: Low Risk  (01/29/2022)   Received from [facility], [facility]   Overall Financial Resource Strain (CARDIA)    Difficulty of Paying Living Expenses: Not hard at all  Food Insecurity: No Food Insecurity (01/16/2023)   Hunger Vital Sign    Worried About Running Out of Food in the Last Year: Never true    Ran Out of Food in the Last Year: Never true  Transportation Needs: No Transportation Needs (01/16/2023)   PRAPARE - Administrator, Civil Service (Medical): No    Lack of Transportation (Non-Medical): No  Physical Activity: Not on file  Stress: No Stress Concern Present (01/29/2022)   Received from [facility], [facility] of Occupational Health - Occupational Stress Questionnaire    Feeling of Stress : Not at all  Social Connections: Not on file  Intimate Partner Violence: Not At Risk (01/16/2023)   Humiliation, Afraid, Rape, and Kick questionnaire    Fear of Current or Ex-Partner: No    Emotionally Abused: No    Physically Abused: No    Sexually Abused: No    Review of Systems: Positive for: As per HPI  Physical Exam: Vital signs in last 24 hours: Temp:  [97.5 F (36.4 C)-98.4 F (36.9 C)] 98.1 F (36.7 C) (10/07 0741) Pulse Rate:  [73-100] 80 (10/07 0830) Resp:  [16-22] 17 (10/07 0830) BP: (143-193)/(50-91) 174/76 (10/07 0741) SpO2:  [95 %-100 %] 97 %  (10/07 0830)    General:   Alert,  Well-developed, well-nourished, pleasant and cooperative in NAD Head:  Normocephalic and atraumatic. Eyes:  Sclera clear, no icterus.  Mild pallor Ears:  Normal auditory acuity. Nose:  No deformity, discharge,  or lesions. Mouth:  No deformity or lesions.  Oropharynx pink & moist. Neck:  Supple; no masses or thyromegaly. Lungs:  Clear throughout to auscultation.   No wheezes, crackles, or rhonchi. No acute distress. Heart:  Regular rate and rhythm; no murmurs, clicks, rubs,  or gallops. Extremities:  Without clubbing or edema. Neurologic:  Alert and  oriented x4;  grossly normal neurologically. Skin:  Intact without significant lesions or rashes. Psych:  Alert and cooperative. Normal mood and affect. Abdomen:  Soft, nontender and nondistended. No masses, hepatosplenomegaly or hernias noted. Normal bowel sounds, without guarding, and without rebound.         Lab Results: Recent Labs    01/16/23 1325  WBC 10.1  HGB 7.9*  HCT 26.4*  PLT 262   BMET Recent Labs    01/16/23 1325  NA 138  K 3.8  CL 100  CO2 26  GLUCOSE 103*  BUN 17  CREATININE 1.03  CALCIUM 8.5*   LFT Recent Labs    01/16/23 1325  PROT 6.8  ALBUMIN 3.7  AST 21  ALT 16  ALKPHOS 60  BILITOT 0.5   PT/INR No results for input(s): "LABPROT", "INR" in the last 72 hours.  Studies/Results: DG Chest Port 1 View  Result Date: 01/16/2023 CLINICAL DATA:  Near syncopal episode. EXAM: PORTABLE CHEST 1 VIEW COMPARISON:  11/16/2021 FINDINGS: The heart size and mediastinal contours are within normal limits. Both lungs are clear. The visualized skeletal structures are unremarkable. IMPRESSION: No active disease. Electronically Signed   By: Danae Orleans M.D.   On: 01/16/2023 14:47    Impression: Severe symptomatic anemia without obvious melena or hematochezia  History of multiple adenomas removed in 2020 History of H. pylori related gastritis and gastric ulcer, status  posttreatment for H. Pylori  On aspirin and Plavix, as per vascular surgery, patient to stay on aspirin and Plavix due to bare-metal stent placed recently in July 2024  Multiple comorbidities-diabetes, dysrhythmia, hypertension, peripheral arterial disease  Plan: Clear liquid diet, n.p.o. postmidnight, colonoscopic prep today Colonoscopy +/- EGD+/- capsule endoscopy tomorrow The risks and the benefits of the procedure were discussed with the patient in details. He understands and verbalizes consent.   LOS: 0 days   Kerin Salen, MD  01/17/2023, 8:45 AM

## 2023-01-17 NOTE — Progress Notes (Deleted)
Colonoscopy tomorrow. Girlfriend will pick patient up at discharge.

## 2023-01-17 NOTE — Anesthesia Preprocedure Evaluation (Signed)
Anesthesia Evaluation  Patient identified by MRN, date of birth, ID band Patient awake    Reviewed: Allergy & Precautions, NPO status , Patient's Chart, lab work & pertinent test results  History of Anesthesia Complications Negative for: history of anesthetic complications  Airway Mallampati: II  TM Distance: >3 FB Neck ROM: Full   Comment: Previous grade I view with MAC 4, easy mask Dental  (+) Edentulous Upper, Edentulous Lower, Dental Advisory Given   Pulmonary neg shortness of breath, neg sleep apnea, neg COPD, neg recent URI, former smoker   Pulmonary exam normal breath sounds clear to auscultation       Cardiovascular hypertension (losartan), Pt. on medications (-) angina + Peripheral Vascular Disease (s/p fem-pop bypass 10/15/2022)  (-) Past MI, (-) Cardiac Stents and (-) CABG + dysrhythmias (RBBB)  Rhythm:Regular Rate:Normal  HLD, carotid stenosis  Low-risk stress test 10/12/2022  TTE 10/12/2022: IMPRESSIONS     1. Left ventricular ejection fraction, by estimation, is 55 to 60%. The  left ventricle has normal function. Left ventricular endocardial border  not optimally defined to evaluate regional wall motion. There is mild left  ventricular hypertrophy. Left ventricular diastolic parameters are consistent with Grade I diastolic dysfunction (impaired relaxation).   2. Right ventricular systolic function is normal. The right ventricular  size is normal.   3. The mitral valve is abnormal. Trivial mitral valve regurgitation.   4. The aortic valve is tricuspid. There is mild calcification of the  aortic valve. There is mild thickening of the aortic valve. Aortic valve  regurgitation is not visualized. Aortic valve sclerosis is present, with  no evidence of aortic valve stenosis.     Neuro/Psych negative neurological ROS     GI/Hepatic Neg liver ROS, PUD,,,  Endo/Other  diabetes, Type 2, Oral Hypoglycemic Agents, Insulin  Dependent    Renal/GU negative Renal ROS     Musculoskeletal   Abdominal   Peds  Hematology  (+) Blood dyscrasia, anemia   Anesthesia Other Findings Last Plavix: this morning  Reproductive/Obstetrics                             Anesthesia Physical Anesthesia Plan  ASA: 3  Anesthesia Plan: MAC   Post-op Pain Management:    Induction: Intravenous  PONV Risk Score and Plan: 1 and Propofol infusion and Treatment may vary due to age or medical condition  Airway Management Planned: Natural Airway and Nasal Cannula  Additional Equipment:   Intra-op Plan:   Post-operative Plan:   Informed Consent: I have reviewed the patients History and Physical, chart, labs and discussed the procedure including the risks, benefits and alternatives for the proposed anesthesia with the patient or authorized representative who has indicated his/her understanding and acceptance.     Dental advisory given  Plan Discussed with: CRNA and Anesthesiologist  Anesthesia Plan Comments: (Discussed with patient risks of MAC including, but not limited to, minor pain or discomfort, hearing people in the room, and possible need for backup general anesthesia. Risks for general anesthesia also discussed including, but not limited to, sore throat, hoarse voice, chipped/damaged teeth, injury to vocal cords, nausea and vomiting, allergic reactions, lung infection, heart attack, stroke, and death. All questions answered. )       Anesthesia Quick Evaluation

## 2023-01-18 ENCOUNTER — Encounter (HOSPITAL_COMMUNITY)
Admission: EM | Disposition: A | Discharge status: Home / Self Care | Payer: Self-pay | Source: Home / Self Care | Attending: Internal Medicine

## 2023-01-18 ENCOUNTER — Inpatient Hospital Stay (HOSPITAL_COMMUNITY): Payer: Medicare Other | Admitting: Anesthesiology

## 2023-01-18 ENCOUNTER — Encounter (HOSPITAL_COMMUNITY): Payer: Self-pay | Admitting: Internal Medicine

## 2023-01-18 DIAGNOSIS — K648 Other hemorrhoids: Secondary | ICD-10-CM

## 2023-01-18 DIAGNOSIS — Z860101 Personal history of adenomatous and serrated colon polyps: Secondary | ICD-10-CM | POA: Diagnosis not present

## 2023-01-18 DIAGNOSIS — D509 Iron deficiency anemia, unspecified: Secondary | ICD-10-CM

## 2023-01-18 DIAGNOSIS — E119 Type 2 diabetes mellitus without complications: Secondary | ICD-10-CM

## 2023-01-18 DIAGNOSIS — I951 Orthostatic hypotension: Secondary | ICD-10-CM | POA: Diagnosis not present

## 2023-01-18 HISTORY — PX: ESOPHAGOGASTRODUODENOSCOPY (EGD) WITH PROPOFOL: SHX5813

## 2023-01-18 HISTORY — PX: GIVENS CAPSULE STUDY: SHX5432

## 2023-01-18 HISTORY — PX: COLONOSCOPY WITH PROPOFOL: SHX5780

## 2023-01-18 LAB — CBC
HCT: 24.1 % — ABNORMAL LOW (ref 39.0–52.0)
Hemoglobin: 7.4 g/dL — ABNORMAL LOW (ref 13.0–17.0)
MCH: 25.9 pg — ABNORMAL LOW (ref 26.0–34.0)
MCHC: 30.7 g/dL (ref 30.0–36.0)
MCV: 84.3 fL (ref 80.0–100.0)
Platelets: 252 10*3/uL (ref 150–400)
RBC: 2.86 MIL/uL — ABNORMAL LOW (ref 4.22–5.81)
RDW: 16.6 % — ABNORMAL HIGH (ref 11.5–15.5)
WBC: 6.4 10*3/uL (ref 4.0–10.5)
nRBC: 0 % (ref 0.0–0.2)

## 2023-01-18 LAB — GLUCOSE, CAPILLARY
Glucose-Capillary: 121 mg/dL — ABNORMAL HIGH (ref 70–99)
Glucose-Capillary: 66 mg/dL — ABNORMAL LOW (ref 70–99)
Glucose-Capillary: 93 mg/dL (ref 70–99)
Glucose-Capillary: 95 mg/dL (ref 70–99)
Glucose-Capillary: 107 mg/dL — ABNORMAL HIGH (ref 70–99)
Glucose-Capillary: 226 mg/dL — ABNORMAL HIGH (ref 70–99)
Glucose-Capillary: 207 mg/dL — ABNORMAL HIGH (ref 70–99)

## 2023-01-18 SURGERY — COLONOSCOPY WITH PROPOFOL
Anesthesia: Monitor Anesthesia Care

## 2023-01-18 MED ORDER — HYDRALAZINE HCL 20 MG/ML IJ SOLN
10.0000 mg | INTRAMUSCULAR | Status: DC | PRN
  Administered 2023-01-19: 10 mg via INTRAVENOUS
  Filled 2023-01-18: qty 1

## 2023-01-18 MED ORDER — INSULIN GLARGINE-YFGN 100 UNIT/ML ~~LOC~~ SOLN: 20.0000 [IU] | Freq: Every day | SUBCUTANEOUS | Status: DC

## 2023-01-18 MED ORDER — DEXTROSE 50 % IV SOLN
12.5000 g | INTRAVENOUS | Status: AC
  Administered 2023-01-18: 12.5 g via INTRAVENOUS

## 2023-01-18 MED ORDER — DEXTROSE 50 % IV SOLN
INTRAVENOUS | Status: AC
  Filled 2023-01-18: qty 50

## 2023-01-18 MED ORDER — PHENYLEPHRINE 80 MCG/ML (10ML) SYRINGE FOR IV PUSH (FOR BLOOD PRESSURE SUPPORT)
PREFILLED_SYRINGE | INTRAVENOUS | Status: DC | PRN
  Administered 2023-01-18: 160 ug via INTRAVENOUS
  Administered 2023-01-18 (×2): 80 ug via INTRAVENOUS

## 2023-01-18 MED ORDER — PROPOFOL 10 MG/ML IV BOLUS
INTRAVENOUS | Status: DC | PRN
  Administered 2023-01-18 (×3): 50 mg via INTRAVENOUS

## 2023-01-18 MED ORDER — INSULIN ASPART 100 UNIT/ML IJ SOLN
0.0000 [IU] | Freq: Three times a day (TID) | INTRAMUSCULAR | Status: DC
  Administered 2023-01-18: 3 [IU] via SUBCUTANEOUS
  Administered 2023-01-19: 2 [IU] via SUBCUTANEOUS

## 2023-01-18 MED ORDER — PROPOFOL 500 MG/50ML IV EMUL
INTRAVENOUS | Status: DC | PRN
  Administered 2023-01-18: 100 ug/kg/min via INTRAVENOUS

## 2023-01-18 MED ORDER — LIDOCAINE 2% (20 MG/ML) 5 ML SYRINGE
INTRAMUSCULAR | Status: DC | PRN
  Administered 2023-01-18: 40 mg via INTRAVENOUS

## 2023-01-18 MED ORDER — LACTATED RINGERS IV SOLN
INTRAVENOUS | Status: AC | PRN
Start: 2023-01-18 — End: 2023-01-18
  Administered 2023-01-18: 1000 mL via INTRAVENOUS

## 2023-01-18 SURGICAL SUPPLY — 25 items
BLOCK BITE 60FR ADLT L/F BLUE (MISCELLANEOUS) ×1 IMPLANT
ELECT REM PT RETURN 9FT ADLT (ELECTROSURGICAL)
ELECTRODE REM PT RTRN 9FT ADLT (ELECTROSURGICAL) IMPLANT
FCP BXJMBJMB 240X2.8X (CUTTING FORCEPS)
FLOOR PAD 36X40 (MISCELLANEOUS) ×1
FORCEP RJ3 GP 1.8X160 W-NEEDLE (CUTTING FORCEPS) IMPLANT
FORCEPS BIOP RAD 4 LRG CAP 4 (CUTTING FORCEPS) IMPLANT
FORCEPS BIOP RJ4 240 W/NDL (CUTTING FORCEPS)
FORCEPS BXJMBJMB 240X2.8X (CUTTING FORCEPS) IMPLANT
INJECTOR/SNARE I SNARE (MISCELLANEOUS) IMPLANT
LUBRICANT JELLY 4.5OZ STERILE (MISCELLANEOUS) IMPLANT
MANIFOLD NEPTUNE II (INSTRUMENTS) IMPLANT
NDL SCLEROTHERAPY 25GX240 (NEEDLE) IMPLANT
NEEDLE SCLEROTHERAPY 25GX240 (NEEDLE) IMPLANT
PAD FLOOR 36X40 (MISCELLANEOUS) ×1 IMPLANT
PROBE APC STR FIRE (PROBE) IMPLANT
PROBE INJECTION GOLD (MISCELLANEOUS)
PROBE INJECTION GOLD 7FR (MISCELLANEOUS) IMPLANT
SNARE ROTATE MED OVAL 20MM (MISCELLANEOUS) IMPLANT
SNARE SHORT THROW 13M SML OVAL (MISCELLANEOUS) IMPLANT
SYR 50ML LL SCALE MARK (SYRINGE) IMPLANT
TRAP SPECIMEN MUCOUS 40CC (MISCELLANEOUS) IMPLANT
TUBING ENDO SMARTCAP PENTAX (MISCELLANEOUS) ×2 IMPLANT
TUBING IRRIGATION ENDOGATOR (MISCELLANEOUS) ×2 IMPLANT
WATER STERILE IRR 1000ML POUR (IV SOLUTION) IMPLANT

## 2023-01-18 NOTE — Progress Notes (Signed)
ADDENDUM to previous Progress note pt to remain NPO until 1315 not 1215 kmb

## 2023-01-18 NOTE — Progress Notes (Signed)
PROGRESS NOTE  KALI DANG  WJX:[id number] DOB: 12/29/1945 DOA: 01/16/2023 PCP: Benita Stabile, MD   Brief Narrative: Patient is a 77 year old male with history of hypertension, hyperlipidemia, GI bleed due to peptic ulcer, chronic anemia due to blood loss, labs after, peripheral artery disease status post femoral-popliteal bypass on the left, currently on aspirin, Plavix presented to the emergency department with near syncope episode, lightheadedness.  EMS found him to be orthostatic, given IV fluid en route to ED.  On presentation, hemoglobin found to be 7.9.  Blood pressure stable/slightly hypertensive.  FOBT was positive.  GI consulted.  Plan for EGD/colonoscopy today  Assessment & Plan:  Principal Problem:   Orthostatic hypotension Active Problems:   GI bleeding   PAD (peripheral artery disease) (HCC)   Diabetes mellitus, type 2 (HCC)   Iron deficiency anemia due to chronic blood loss   Hyperlipidemia   Presyncope/orthostatic hypotension: Likely from anemia, acute on chronic blood loss.  Orthostatic when checked on admission.  Started on IV fluids,now stopped.  He is actually hypertensive this morning.  No dizziness or lightheadedness this morning.  GI bleed/acute on chronic blood loss.  No hematochezia or melena.  History of adenoma, aspirated gastritis/gastric ulcer.  FOBT positive.  Currently on IV Protonix.  Hemoglobin in the range of 7.  Found to be deficient in iron.  Given IV iron.  Continue oral iron supplementation on discharge. GI planning for EGD/colonoscopy and possible capsule endoscopy today  History of peripheral artery disease: On aspirin and Plavix.  Recent history of femoropopliteal bypass, stent placement.  Case was discussed with vascular surgery and recommending to continue aspirin and Plavix  Diabetes type 2: Currently on sliding scale, monitor blood sugars.  Takes metformin, Tresiba at home.  Became hypoglycemic yesterday.  Monitor blood sugars  Hyperlipidemia:  On atorvastatin.  History of hypertension: Takes losartan at home.  He was initially hypertensive but currently hypertensive.  Resumed          DVT prophylaxis:SCDs Start: 01/16/23 1814     Code Status: Full Code  Family Communication: None at the bedside  Patient status: Inpatient  Patient is from :Home  Anticipated discharge OZ:HYQM  Estimated DC date: After GI clearance   Consultants: GI  Procedures:None  Antimicrobials:  Anti-infectives (From admission, onward)    None       Subjective: Patient seen and examined at bedside today.  Hemodynamically stable.  Lying in bed.  No report of hematochezia or melena after admission.  No dizziness or lightheadedness.  Waiting for EGD colonoscopy  Objective: Vitals:   01/17/23 2132 01/18/23 0550 01/18/23 0733 01/18/23 1003  BP: (!) 147/61 (!) 152/53 (!) 182/64 (!) 205/88  Pulse: 79 74 75 88  Resp: 16 18 18 19   Temp: 97.9 F (36.6 C) 98.5 F (36.9 C) 97.9 F (36.6 C) (!) 97.5 F (36.4 C)  TempSrc: Oral Oral Oral Temporal  SpO2: 96% 96% 95%   Weight:    81.6 kg  Height:    5\' 11"  (1.803 m)    Intake/Output Summary (Last 24 hours) at 01/18/2023 1127 Last data filed at 01/18/2023 1118 Gross per 24 hour  Intake 580 ml  Output --  Net 580 ml   Filed Weights   01/18/23 1003  Weight: 81.6 kg    Examination:  General exam: Overall comfortable, not in distress HEENT: PERRL Respiratory system:  no wheezes or crackles  Cardiovascular system: S1 & S2 heard, RRR.  Gastrointestinal system: Abdomen is nondistended, soft and  nontender. Central nervous system: Alert and oriented Extremities: No edema, no clubbing ,no cyanosis Skin: No rashes, no ulcers,no icterus    Data Reviewed: I have personally reviewed following labs and imaging studies  CBC: Recent Labs  Lab 01/16/23 1325 01/17/23 0813 01/18/23 0447  WBC 10.1 6.3 6.4  NEUTROABS 7.8*  --   --   HGB 7.9* 7.5* 7.4*  HCT 26.4* 24.9* 24.1*  MCV 84.3  85.3 84.3  PLT 262 272 252   Basic Metabolic Panel: Recent Labs  Lab 01/16/23 1325 01/17/23 0813  NA 138 140  K 3.8 3.7  CL 100 106  CO2 26 25  GLUCOSE 103* 99  BUN 17 10  CREATININE 1.03 0.81  CALCIUM 8.5* 8.2*     No results found for this or any previous visit (from the past 240 hour(s)).   Radiology Studies: DG Chest Port 1 View  Result Date: 01/16/2023 CLINICAL DATA:  Near syncopal episode. EXAM: PORTABLE CHEST 1 VIEW COMPARISON:  11/16/2021 FINDINGS: The heart size and mediastinal contours are within normal limits. Both lungs are clear. The visualized skeletal structures are unremarkable. IMPRESSION: No active disease. Electronically Signed   By: Danae Orleans M.D.   On: 01/16/2023 14:47    Scheduled Meds:  [MAR Hold] aspirin EC  81 mg Oral Q0600   [MAR Hold] atorvastatin  40 mg Oral Daily   [MAR Hold] clopidogrel  75 mg Oral Daily   dextrose       [MAR Hold] ferrous sulfate  325 mg Oral Q breakfast   [MAR Hold] fluticasone furoate-vilanterol  1 puff Inhalation Daily   [MAR Hold] insulin aspart  0-9 Units Subcutaneous TID WC   [MAR Hold] losartan  50 mg Oral BID   [MAR Hold] melatonin  10 mg Oral QHS   [MAR Hold] multivitamin with minerals  1 tablet Oral Daily   [MAR Hold] pantoprazole (PROTONIX) IV  40 mg Intravenous Q12H   Continuous Infusions:  lactated ringers 10 mL/hr at 01/18/23 1041     LOS: 1 day   Burnadette Pop, MD [facility] P10/11/2022, 11:27 AM

## 2023-01-18 NOTE — Anesthesia Postprocedure Evaluation (Signed)
Anesthesia Post Note  Patient: JONTRELL BUSHONG  Procedure(s) Performed: COLONOSCOPY WITH PROPOFOL ESOPHAGOGASTRODUODENOSCOPY (EGD) WITH PROPOFOL GIVENS CAPSULE STUDY     Patient location during evaluation: PACU Anesthesia Type: MAC Level of consciousness: awake Pain management: pain level controlled Vital Signs Assessment: post-procedure vital signs reviewed and stable Respiratory status: spontaneous breathing, nonlabored ventilation and respiratory function stable Cardiovascular status: stable and blood pressure returned to baseline Postop Assessment: no apparent nausea or vomiting Anesthetic complications: no   No notable events documented.  Last Vitals:  Vitals:   01/18/23 1135 01/18/23 1140  BP: (!) 152/78 (!) 148/59  Pulse: 78 73  Resp: 18 19  Temp:    SpO2: 94% 97%    Last Pain:  Vitals:   01/18/23 1140  TempSrc:   PainSc: 0-No pain                 Linton Rump

## 2023-01-18 NOTE — Progress Notes (Signed)
[facility] capsule endoscopy ordered by MD Marca Ancona.  Patient ingested capsule at 1115 on 01/18/2023.  Per Given's capsule instructions, patient to remain NPO until 1215  at which time they may progress to clear liquid diet. At 1515 patient may have a small snack such as a half a sandwich or a bowl of soup. At 1915 patient may progress to previously ordered diet.  The capsule endoscopy study will conclude at 2315 at which time the recorder and leads or belt can be removed and placed in a patient belongings bag. Endoscopy staff will pick up the equipment in the AM.

## 2023-01-18 NOTE — Interval H&P Note (Signed)
History and Physical Interval Note: 77/male with symptomatic anemia, multiple tubular adenomas removal in 2020, H pylori gastritis related GU(treated), on plavix for Colonoscopy +/- EGD +/- Capsule endoscopy with propofol.  01/18/2023 10:27 AM  [name]  has presented today for Colonoscopy +/- EGD +/- Capsule endoscopy with propofol, with the diagnosis of symptomatic anemia, history of H pylori relatd Gastric ulcers, on plvaix and ASA.  The various methods of treatment have been discussed with the patient and family. After consideration of risks, benefits and other options for treatment, the patient has consented to  Procedure(s): COLONOSCOPY WITH PROPOFOL (N/A) ESOPHAGOGASTRODUODENOSCOPY (EGD) WITH PROPOFOL (N/A) GIVENS CAPSULE STUDY (N/A) as a surgical intervention.  The patient's history has been reviewed, patient examined, no change in status, stable for surgery.  I have reviewed the patient's chart and labs.  Questions were answered to the patient's satisfaction.     Kerin Salen

## 2023-01-18 NOTE — Plan of Care (Signed)
  Problem: Education: Goal: Understanding of CV disease, CV risk reduction, and recovery process will improve Outcome: Progressing Goal: Individualized Educational Video(s) Outcome: Progressing   Problem: Activity: Goal: Ability to return to baseline activity level will improve Outcome: Progressing   Problem: Cardiovascular: Goal: Ability to achieve and maintain adequate cardiovascular perfusion will improve Outcome: Progressing Goal: Vascular access site(s) Level 0-1 will be maintained Outcome: Progressing   Problem: Health Behavior/Discharge Planning: Goal: Ability to safely manage health-related needs after discharge will improve Outcome: Progressing   Problem: Education: Goal: Knowledge of prescribed regimen will improve Outcome: Progressing   Problem: Activity: Goal: Ability to tolerate increased activity will improve Outcome: Progressing   Problem: Skin Integrity: Goal: Demonstration of wound healing without infection will improve Outcome: Progressing   Problem: Education: Goal: Ability to describe self-care measures that may prevent or decrease complications (Diabetes Survival Skills Education) will improve Outcome: Progressing Goal: Individualized Educational Video(s) Outcome: Progressing   Problem: Coping: Goal: Ability to adjust to condition or change in health will improve Outcome: Progressing   Problem: Fluid Volume: Goal: Ability to maintain a balanced intake and output will improve Outcome: Progressing   Problem: Health Behavior/Discharge Planning: Goal: Ability to identify and utilize available resources and services will improve Outcome: Progressing Goal: Ability to manage health-related needs will improve Outcome: Progressing   Problem: Metabolic: Goal: Ability to maintain appropriate glucose levels will improve Outcome: Progressing   Problem: Nutritional: Goal: Maintenance of adequate nutrition will improve Outcome: Progressing Goal:  Progress toward achieving an optimal weight will improve Outcome: Progressing   Problem: Skin Integrity: Goal: Risk for impaired skin integrity will decrease Outcome: Progressing   Problem: Tissue Perfusion: Goal: Adequacy of tissue perfusion will improve Outcome: Progressing   Problem: Education: Goal: Knowledge of General Education information will improve Description: Including pain rating scale, medication(s)/side effects and non-pharmacologic comfort measures Outcome: Progressing   Problem: Health Behavior/Discharge Planning: Goal: Ability to manage health-related needs will improve Outcome: Progressing   Problem: Clinical Measurements: Goal: Ability to maintain clinical measurements within normal limits will improve Outcome: Progressing Goal: Will remain free from infection Outcome: Progressing Goal: Diagnostic test results will improve Outcome: Progressing Goal: Respiratory complications will improve Outcome: Progressing Goal: Cardiovascular complication will be avoided Outcome: Progressing   Problem: Activity: Goal: Risk for activity intolerance will decrease Outcome: Progressing   Problem: Nutrition: Goal: Adequate nutrition will be maintained Outcome: Progressing   Problem: Coping: Goal: Level of anxiety will decrease Outcome: Progressing   Problem: Elimination: Goal: Will not experience complications related to bowel motility Outcome: Progressing Goal: Will not experience complications related to urinary retention Outcome: Progressing   Problem: Pain Managment: Goal: General experience of comfort will improve Outcome: Progressing   Problem: Safety: Goal: Ability to remain free from injury will improve Outcome: Progressing   Problem: Skin Integrity: Goal: Risk for impaired skin integrity will decrease Outcome: Progressing

## 2023-01-18 NOTE — Op Note (Addendum)
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 Estimated Blood Loss:     Estimated blood loss: none. Procedure:                Pre-Anesthesia Assessment:                           - Prior to the procedure, a History and Physical                            was performed, and patient medications and                            allergies were reviewed. The patient's tolerance of                            previous anesthesia was also reviewed. The risks                            and benefits of the procedure and the sedation                            options and risks were discussed with the patient.                            All questions were answered, and informed consent                            was obtained. Prior Anticoagulants: The patient has                            taken Plavix (clopidogrel), last dose was day of                            procedure. ASA Grade Assessment: III - A patient  with severe systemic disease. After reviewing the                            risks and benefits, the patient was deemed in                            satisfactory condition to undergo the procedure.                           After obtaining informed consent, the colonoscope                            was passed under direct vision. Throughout the                             procedure, the patient's blood pressure, pulse, and                            oxygen saturations were monitored continuously. The                            PCF-HQ190L ([id number]) Olympus colonoscope was                            introduced through the anus and advanced to the the                            terminal ileum. The colonoscopy was performed                            without difficulty. The patient tolerated the                            procedure well. The quality of the bowel                            preparation was poor. The terminal ileum, ileocecal                            valve, appendiceal orifice, and rectum were                            photographed. Scope In: 10:49:38 AM Scope Out: 11:05:03 AM Scope Withdrawal Time: 0 hours 11 minutes 29 seconds  Total Procedure Duration: 0 hours 15 minutes 25 seconds  Findings:      The perianal and digital rectal examinations were normal.      The terminal ileum appeared normal.      Semi-liquid semi-solid stool was found in the entire colon, interfering       with visualization. Lavage of the area was performed, resulting in       incomplete clearance with fair visualization.      Multiple large-mouthed and medium-mouthed diverticula were found in the       sigmoid colon and descending colon.      Non-bleeding internal hemorrhoids were  found during retroflexion. The       hemorrhoids were small. Impression:               - Preparation of the colon was poor.                           - The examined portion of the ileum was normal.                           - Stool in the entire examined colon.                           - Diverticulosis in the sigmoid colon and in the                            descending colon.                           - Non-bleeding internal hemorrhoids.                           - No specimens collected. Moderate Sedation:      Patient did not receive moderate sedation for this  procedure, but       instead received monitored anesthesia care. Recommendation:           - Repeat colonoscopy in 6 months because the bowel                            preparation was suboptimal with a 2-day prep, when                            plavix can be safely held for at least 5 days for                            the procedure.                           - Perform an upper GI endoscopy today. Procedure Code(s):        --- Professional ---                           [phone], Colonoscopy, flexible; diagnostic, including                            collection of specimen(s) by brushing or washing,                            when performed (separate procedure) Diagnosis Code(s):        --- Professional ---                           K64.8, Other hemorrhoids                           D50.9, Iron deficiency anemia, unspecified  K57.30, Diverticulosis of large intestine without                            perforation or abscess without bleeding CPT copyright 2022 [employer]. All rights reserved. The codes documented in this report are preliminary and upon coder review may  be revised to meet current compliance requirements. Kerin Salen, MD 01/18/2023 11:23:45 AM This report has been signed electronically. Number of Addenda: 0

## 2023-01-18 NOTE — Op Note (Signed)
[facility] Patient Name: Randy Jacobson Procedure Date : 01/18/2023 MRN: [id number] Attending MD: Kerin Salen , MD, [id number] Date of Birth: Aug 23, 1945 CSN: [id number] Age: 77 Admit Type: Inpatient Procedure:                Upper GI endoscopy Indications:              Unexplained iron deficiency anemia Providers:                Kerin Salen, MD, Stephens Shire RN, RN, Sunday Corn                            Mbumina, Technician Referring MD:             [facility] Medicines:                Monitored Anesthesia Care Complications:            No immediate complications. Estimated Blood Loss:     Estimated blood loss: none. Procedure:                Pre-Anesthesia Assessment:                           - Prior to the procedure, a History and Physical                            was performed, and patient medications and                            allergies were reviewed. The patient's tolerance of                            previous anesthesia was also reviewed. The risks                            and benefits of the procedure and the sedation                            options and risks were discussed with the patient.                            All questions were answered, and informed consent                            was obtained. Prior Anticoagulants: The patient has                            taken Plavix (clopidogrel), last dose was day of                            procedure. ASA Grade Assessment: III - A patient                            with severe systemic disease. After reviewing the  risks and benefits, the patient was deemed in                            satisfactory condition to undergo the procedure.                           After obtaining informed consent, the endoscope was                            passed under direct vision. Throughout the                            procedure, the patient's blood pressure, pulse, and                             oxygen saturations were monitored continuously. The                            [id number]-[id number] ([id number] Olympus endoscope was introduced                            through the mouth, and advanced to the second part                            of duodenum. The upper GI endoscopy was                            accomplished without difficulty. The patient                            tolerated the procedure well. Scope In: Scope Out: Findings:      The examined esophagus was normal.      The Z-line was regular and was found 40 cm from the incisors.      A single 6 mm papule (nodule) with no bleeding and no stigmata of recent       bleeding was found in the cardia. The nodule was Paris classification Is       (protruding, sessile). Biopsy is contraindicated because as plavix was       taken the same day of the procedure.      The entire examined stomach was normal.      The examined duodenum was normal except a medium non-bleeding       diverticulum was found in the second portion of the duodenum.      Using the endoscope, the video capsule enteroscope was advanced into the       duodenal bulb. Impression:               - Normal esophagus.                           - Z-line regular, 40 cm from the incisors.                           - A single papule (nodule) found in the stomach.  Biopsy is contraindicated.                           - Normal stomach.                           - Normal examined duodenum.                           - Non-bleeding duodenal diverticulum.                           - Successful completion of the Video Capsule                            Enteroscope placement.                           - No specimens collected. Moderate Sedation:      Patient did not receive moderate sedation for this procedure, but       instead received monitored anesthesia care. Recommendation:           - As per capsule endoscopy instructions.                            - Repeat upper endoscopy in 6 months to biopsy                            cardiac nodule likely in 6 months (when he can hold                            his plavix, likely at the same time as colonoscopy                            wtih 2-day prep). Procedure Code(s):        --- Professional ---                           [phone], Esophagogastroduodenoscopy, flexible,                            transoral; diagnostic, including collection of                            specimen(s) by brushing or washing, when performed                            (separate procedure) Diagnosis Code(s):        --- Professional ---                           K31.89, Other diseases of stomach and duodenum                           D50.9, Iron deficiency anemia, unspecified  K57.10, Diverticulosis of small intestine without                            perforation or abscess without bleeding CPT copyright 2022 [employer] Association. All rights reserved. The codes documented in this report are preliminary and upon coder review may  be revised to meet current compliance requirements. Kerin Salen, MD 01/18/2023 11:30:28 AM This report has been signed electronically. Number of Addenda: 0

## 2023-01-18 NOTE — Progress Notes (Addendum)
Patient Randy Jacobson was 64. Patient verbalized having no symptoms. 12.5 g ( 25ml) of dextrose 50% was administered per hypoglycemic order. Randy Jacobson recheck was 121. Newton Pigg, DO was made aware.  Randy Jacobson

## 2023-01-18 NOTE — Progress Notes (Signed)
[facility] night cross cover note:   I was notified by RN that the patient's blood pressure is running high, mostly recently noted to be 192/71, with heart rates in the 70s.  RN conveys that the patient was hypertensive during dayshift, with highest blood pressure noted to be in the 170s mmHg. the patient is completely asymptomatic at this time.  I have subsequently ordered prn IV hydralazine for systolic blood pressure greater than 180 or diastolic blood pressure greater than .  mmHg.     Newton Pigg, DO Hospitalist

## 2023-01-18 NOTE — Transfer of Care (Signed)
Immediate Anesthesia Transfer of Care Note  Patient: Randy Jacobson  Procedure(s) Performed: COLONOSCOPY WITH PROPOFOL ESOPHAGOGASTRODUODENOSCOPY (EGD) WITH PROPOFOL GIVENS CAPSULE STUDY  Patient Location: PACU and Endoscopy Unit  Anesthesia Type:MAC  Level of Consciousness: drowsy  Airway & Oxygen Therapy: Patient Spontanous Breathing and Patient connected to nasal cannula oxygen  Post-op Assessment: Report given to RN and Post -op Vital signs reviewed and stable  Post vital signs: Reviewed and stable  Last Vitals:  Vitals Value Taken Time  BP 83/34 01/18/23 1131  Temp    Pulse 74 01/18/23 1135  Resp 15 01/18/23 1135  SpO2 97 % 01/18/23 1135  Vitals shown include unfiled device data.  Last Pain:  Vitals:   01/18/23 1131  TempSrc:   PainSc: 0-No pain         Complications: No notable events documented.

## 2023-01-19 DIAGNOSIS — D649 Anemia, unspecified: Secondary | ICD-10-CM

## 2023-01-19 DIAGNOSIS — I739 Peripheral vascular disease, unspecified: Secondary | ICD-10-CM

## 2023-01-19 DIAGNOSIS — R195 Other fecal abnormalities: Secondary | ICD-10-CM | POA: Diagnosis not present

## 2023-01-19 DIAGNOSIS — D500 Iron deficiency anemia secondary to blood loss (chronic): Secondary | ICD-10-CM

## 2023-01-19 DIAGNOSIS — E785 Hyperlipidemia, unspecified: Secondary | ICD-10-CM

## 2023-01-19 DIAGNOSIS — R55 Syncope and collapse: Principal | ICD-10-CM

## 2023-01-19 DIAGNOSIS — I951 Orthostatic hypotension: Secondary | ICD-10-CM | POA: Diagnosis not present

## 2023-01-19 DIAGNOSIS — K922 Gastrointestinal hemorrhage, unspecified: Secondary | ICD-10-CM | POA: Diagnosis not present

## 2023-01-19 LAB — CBC
HCT: 25.5 % — ABNORMAL LOW (ref 39.0–52.0)
Hemoglobin: 7.9 g/dL — ABNORMAL LOW (ref 13.0–17.0)
MCH: 25.9 pg — ABNORMAL LOW (ref 26.0–34.0)
MCHC: 31 g/dL (ref 30.0–36.0)
MCV: 83.6 fL (ref 80.0–100.0)
Platelets: 264 10*3/uL (ref 150–400)
RBC: 3.05 MIL/uL — ABNORMAL LOW (ref 4.22–5.81)
RDW: 16.6 % — ABNORMAL HIGH (ref 11.5–15.5)
WBC: 7.9 10*3/uL (ref 4.0–10.5)
nRBC: 0 % (ref 0.0–0.2)

## 2023-01-19 LAB — GLUCOSE, CAPILLARY: Glucose-Capillary: 159 mg/dL — ABNORMAL HIGH (ref 70–99)

## 2023-01-19 MED ORDER — AMLODIPINE BESYLATE 5 MG PO TABS
5.0000 mg | ORAL_TABLET | Freq: Every day | ORAL | Status: DC
  Administered 2023-01-19: 5 mg via ORAL
  Filled 2023-01-19: qty 1

## 2023-01-19 MED ORDER — FERROUS SULFATE 325 (65 FE) MG PO TABS: 325.0000 mg | ORAL_TABLET | Freq: Every day | ORAL | 3 refills | Status: DC

## 2023-01-19 NOTE — Discharge Summary (Signed)
Physician Discharge Summary   Patient: Randy Jacobson MRN: [id number] DOB: 01/01/1946  Admit date:     01/16/2023  Discharge date: 01/19/23  Discharge Physician: Arnetha Courser   PCP: Benita Stabile, MD   Recommendations at discharge:  Please obtain CBC and BMP on follow-up Follow-up with primary care provider Follow-up with gastroenterology  Discharge Diagnoses: Principal Problem:   Orthostatic hypotension Active Problems:   GI bleeding   PAD (peripheral artery disease) (HCC)   Diabetes mellitus, type 2 (HCC)   Iron deficiency anemia due to chronic blood loss   Hyperlipidemia   Near syncope   Fecal occult blood test positive   Symptomatic anemia   Hospital Course: 77 year old male with history of hypertension, hyperlipidemia, GI bleed due to peptic ulcer, chronic anemia due to blood loss, labs after, peripheral artery disease status post femoral-popliteal bypass on the left, currently on aspirin, Plavix presented to the emergency department with near syncope episode, lightheadedness. EMS found him to be orthostatic, given IV fluid en route to ED. On presentation, hemoglobin found to be 7.9. Blood pressure stable/slightly hypertensive. FOBT was positive. GI consulted.   Patient had EGD and capsule endoscopy which was normal. Anemia panel with concern of iron deficiency, received 1 dose of IV iron and started on oral supplement.  Patient will follow-up with gastroenterology for colonoscopy as outpatient. No obvious hematochezia or melena.  He has an history of adenoma, aspirated gastritis/gastric ulcer but current EGD was without any significant abnormality.  Patient is on aspirin and Plavix with history of recent femoropopliteal bypass with stent placement.  Case was discussed with vascular surgery and they were recommending continuation of DAPT.  Patient currently stable and is being discharged to continue his home medications with the addition of iron supplement and need to have a  close follow-up with his providers for further management.   Consultants: Gastroenterology Procedures performed: EGD, capsule endoscopy Disposition: Home Diet recommendation:  Discharge Diet Orders (From admission, onward)     Start     Ordered   01/19/23 0000  Diet - low sodium heart healthy        01/19/23 1300           Cardiac and Carb modified diet DISCHARGE MEDICATION: Allergies as of 01/19/2023   No Known Allergies      Medication List     TAKE these medications    aspirin EC 81 MG tablet Take 1 tablet (81 mg total) by mouth daily at 6 (six) AM. Swallow whole.   atorvastatin 40 MG tablet Commonly known as: LIPITOR Take 40 mg by mouth daily.   budesonide-formoterol 160-4.5 MCG/ACT inhaler Commonly known as: Symbicort Inhale 2 puffs into the lungs 2 (two) times daily. Rinse mouth with water after each use   clopidogrel 75 MG tablet Commonly known as: PLAVIX Take 75 mg by mouth daily.   eucerin lotion Apply 1 Application topically as needed for dry skin.   mineral oil-hydrophilic petrolatum ointment Apply 1 Application topically at bedtime.   ferrous sulfate 325 (65 FE) MG tablet Take 1 tablet (325 mg total) by mouth daily with breakfast. Start taking on: January 20, 2023   losartan 50 MG tablet Commonly known as: COZAAR Take 50 mg by mouth in the morning and at bedtime.   Melatonin 10 MG Tabs Take 10 mg by mouth at bedtime.   metFORMIN 1000 MG tablet Commonly known as: GLUCOPHAGE Take 1,000 mg by mouth 2 (two) times daily with a meal.   multivitamin  with minerals Tabs tablet Take 1 tablet by mouth daily.   pantoprazole 40 MG tablet Commonly known as: PROTONIX Take 40 mg by mouth daily.   Evaristo Bury FlexTouch 100 UNIT/ML FlexTouch Pen Generic drug: insulin degludec Inject 38 Units into the skin daily.        Follow-up Information     Benita Stabile, MD. Schedule an appointment as soon as possible for a visit in 1 week(s).   Specialty:  Internal Medicine Contact information: [address] [place] [facility] [address] [phone]                Discharge Exam: Filed Weights   01/18/23 1003  Weight: 81.6 kg   General.  Well-developed elderly man, in no acute distress. Pulmonary.  Lungs clear bilaterally, normal respiratory effort. CV.  Regular rate and rhythm, no JVD, rub or murmur. Abdomen.  Soft, nontender, nondistended, BS positive. CNS.  Alert and oriented .  No focal neurologic deficit. Extremities.  No edema, no cyanosis, pulses intact and symmetrical. Psychiatry.  Judgment and insight appears normal.   Condition at discharge: stable  The results of significant diagnostics from this hospitalization (including imaging, microbiology, ancillary and laboratory) are listed below for reference.   Imaging Studies: DG Chest Port 1 View  Result Date: 01/16/2023 CLINICAL DATA:  Near syncopal episode. EXAM: PORTABLE CHEST 1 VIEW COMPARISON:  11/16/2021 FINDINGS: The heart size and mediastinal contours are within normal limits. Both lungs are clear. The visualized skeletal structures are unremarkable. IMPRESSION: No active disease. Electronically Signed   By: Danae Orleans M.D.   On: 01/16/2023 14:47   VAS US CAROTID  Result Date: 12/30/2022 Carotid Arterial Duplex Study Patient Name:  SEWELL PITNER  Date of Exam:   12/30/2022 Medical Rec #: [id number]      Accession #:    [id number] Date of Birth: June 13, 1945      Patient Gender: M Patient Age:   51 years Exam Location:  [facility] Procedure:      VAS US CAROTID Referring Phys: Cristal Deer DICKSON --------------------------------------------------------------------------------  Indications:                              Carotid artery disease and Visual                                           disturbance left eye. Limitations                               Today's exam was limited due to the                                           high bifurcation of  the carotid,                                           heavy calcification and the resulting  Physician Discharge Summary   Patient: Randy Jacobson MRN: [id number] DOB: 01/01/1946  Admit date:     01/16/2023  Discharge date: 01/19/23  Discharge Physician: Arnetha Courser   PCP: Benita Stabile, MD   Recommendations at discharge:  Please obtain CBC and BMP on follow-up Follow-up with primary care provider Follow-up with gastroenterology  Discharge Diagnoses: Principal Problem:   Orthostatic hypotension Active Problems:   GI bleeding   PAD (peripheral artery disease) (HCC)   Diabetes mellitus, type 2 (HCC)   Iron deficiency anemia due to chronic blood loss   Hyperlipidemia   Near syncope   Fecal occult blood test positive   Symptomatic anemia   Hospital Course: 77 year old male with history of hypertension, hyperlipidemia, GI bleed due to peptic ulcer, chronic anemia due to blood loss, labs after, peripheral artery disease status post femoral-popliteal bypass on the left, currently on aspirin, Plavix presented to the emergency department with near syncope episode, lightheadedness. EMS found him to be orthostatic, given IV fluid en route to ED. On presentation, hemoglobin found to be 7.9. Blood pressure stable/slightly hypertensive. FOBT was positive. GI consulted.   Patient had EGD and capsule endoscopy which was normal. Anemia panel with concern of iron deficiency, received 1 dose of IV iron and started on oral supplement.  Patient will follow-up with gastroenterology for colonoscopy as outpatient. No obvious hematochezia or melena.  He has an history of adenoma, aspirated gastritis/gastric ulcer but current EGD was without any significant abnormality.  Patient is on aspirin and Plavix with history of recent femoropopliteal bypass with stent placement.  Case was discussed with vascular surgery and they were recommending continuation of DAPT.  Patient currently stable and is being discharged to continue his home medications with the addition of iron supplement and need to have a  close follow-up with his providers for further management.   Consultants: Gastroenterology Procedures performed: EGD, capsule endoscopy Disposition: Home Diet recommendation:  Discharge Diet Orders (From admission, onward)     Start     Ordered   01/19/23 0000  Diet - low sodium heart healthy        01/19/23 1300           Cardiac and Carb modified diet DISCHARGE MEDICATION: Allergies as of 01/19/2023   No Known Allergies      Medication List     TAKE these medications    aspirin EC 81 MG tablet Take 1 tablet (81 mg total) by mouth daily at 6 (six) AM. Swallow whole.   atorvastatin 40 MG tablet Commonly known as: LIPITOR Take 40 mg by mouth daily.   budesonide-formoterol 160-4.5 MCG/ACT inhaler Commonly known as: Symbicort Inhale 2 puffs into the lungs 2 (two) times daily. Rinse mouth with water after each use   clopidogrel 75 MG tablet Commonly known as: PLAVIX Take 75 mg by mouth daily.   eucerin lotion Apply 1 Application topically as needed for dry skin.   mineral oil-hydrophilic petrolatum ointment Apply 1 Application topically at bedtime.   ferrous sulfate 325 (65 FE) MG tablet Take 1 tablet (325 mg total) by mouth daily with breakfast. Start taking on: January 20, 2023   losartan 50 MG tablet Commonly known as: COZAAR Take 50 mg by mouth in the morning and at bedtime.   Melatonin 10 MG Tabs Take 10 mg by mouth at bedtime.   metFORMIN 1000 MG tablet Commonly known as: GLUCOPHAGE Take 1,000 mg by mouth 2 (two) times daily with a meal.   multivitamin  Physician Discharge Summary   Patient: Randy Jacobson MRN: [id number] DOB: 01/01/1946  Admit date:     01/16/2023  Discharge date: 01/19/23  Discharge Physician: Arnetha Courser   PCP: Benita Stabile, MD   Recommendations at discharge:  Please obtain CBC and BMP on follow-up Follow-up with primary care provider Follow-up with gastroenterology  Discharge Diagnoses: Principal Problem:   Orthostatic hypotension Active Problems:   GI bleeding   PAD (peripheral artery disease) (HCC)   Diabetes mellitus, type 2 (HCC)   Iron deficiency anemia due to chronic blood loss   Hyperlipidemia   Near syncope   Fecal occult blood test positive   Symptomatic anemia   Hospital Course: 77 year old male with history of hypertension, hyperlipidemia, GI bleed due to peptic ulcer, chronic anemia due to blood loss, labs after, peripheral artery disease status post femoral-popliteal bypass on the left, currently on aspirin, Plavix presented to the emergency department with near syncope episode, lightheadedness. EMS found him to be orthostatic, given IV fluid en route to ED. On presentation, hemoglobin found to be 7.9. Blood pressure stable/slightly hypertensive. FOBT was positive. GI consulted.   Patient had EGD and capsule endoscopy which was normal. Anemia panel with concern of iron deficiency, received 1 dose of IV iron and started on oral supplement.  Patient will follow-up with gastroenterology for colonoscopy as outpatient. No obvious hematochezia or melena.  He has an history of adenoma, aspirated gastritis/gastric ulcer but current EGD was without any significant abnormality.  Patient is on aspirin and Plavix with history of recent femoropopliteal bypass with stent placement.  Case was discussed with vascular surgery and they were recommending continuation of DAPT.  Patient currently stable and is being discharged to continue his home medications with the addition of iron supplement and need to have a  close follow-up with his providers for further management.   Consultants: Gastroenterology Procedures performed: EGD, capsule endoscopy Disposition: Home Diet recommendation:  Discharge Diet Orders (From admission, onward)     Start     Ordered   01/19/23 0000  Diet - low sodium heart healthy        01/19/23 1300           Cardiac and Carb modified diet DISCHARGE MEDICATION: Allergies as of 01/19/2023   No Known Allergies      Medication List     TAKE these medications    aspirin EC 81 MG tablet Take 1 tablet (81 mg total) by mouth daily at 6 (six) AM. Swallow whole.   atorvastatin 40 MG tablet Commonly known as: LIPITOR Take 40 mg by mouth daily.   budesonide-formoterol 160-4.5 MCG/ACT inhaler Commonly known as: Symbicort Inhale 2 puffs into the lungs 2 (two) times daily. Rinse mouth with water after each use   clopidogrel 75 MG tablet Commonly known as: PLAVIX Take 75 mg by mouth daily.   eucerin lotion Apply 1 Application topically as needed for dry skin.   mineral oil-hydrophilic petrolatum ointment Apply 1 Application topically at bedtime.   ferrous sulfate 325 (65 FE) MG tablet Take 1 tablet (325 mg total) by mouth daily with breakfast. Start taking on: January 20, 2023   losartan 50 MG tablet Commonly known as: COZAAR Take 50 mg by mouth in the morning and at bedtime.   Melatonin 10 MG Tabs Take 10 mg by mouth at bedtime.   metFORMIN 1000 MG tablet Commonly known as: GLUCOPHAGE Take 1,000 mg by mouth 2 (two) times daily with a meal.   multivitamin  Physician Discharge Summary   Patient: Randy Jacobson MRN: [id number] DOB: 01/01/1946  Admit date:     01/16/2023  Discharge date: 01/19/23  Discharge Physician: Arnetha Courser   PCP: Benita Stabile, MD   Recommendations at discharge:  Please obtain CBC and BMP on follow-up Follow-up with primary care provider Follow-up with gastroenterology  Discharge Diagnoses: Principal Problem:   Orthostatic hypotension Active Problems:   GI bleeding   PAD (peripheral artery disease) (HCC)   Diabetes mellitus, type 2 (HCC)   Iron deficiency anemia due to chronic blood loss   Hyperlipidemia   Near syncope   Fecal occult blood test positive   Symptomatic anemia   Hospital Course: 77 year old male with history of hypertension, hyperlipidemia, GI bleed due to peptic ulcer, chronic anemia due to blood loss, labs after, peripheral artery disease status post femoral-popliteal bypass on the left, currently on aspirin, Plavix presented to the emergency department with near syncope episode, lightheadedness. EMS found him to be orthostatic, given IV fluid en route to ED. On presentation, hemoglobin found to be 7.9. Blood pressure stable/slightly hypertensive. FOBT was positive. GI consulted.   Patient had EGD and capsule endoscopy which was normal. Anemia panel with concern of iron deficiency, received 1 dose of IV iron and started on oral supplement.  Patient will follow-up with gastroenterology for colonoscopy as outpatient. No obvious hematochezia or melena.  He has an history of adenoma, aspirated gastritis/gastric ulcer but current EGD was without any significant abnormality.  Patient is on aspirin and Plavix with history of recent femoropopliteal bypass with stent placement.  Case was discussed with vascular surgery and they were recommending continuation of DAPT.  Patient currently stable and is being discharged to continue his home medications with the addition of iron supplement and need to have a  close follow-up with his providers for further management.   Consultants: Gastroenterology Procedures performed: EGD, capsule endoscopy Disposition: Home Diet recommendation:  Discharge Diet Orders (From admission, onward)     Start     Ordered   01/19/23 0000  Diet - low sodium heart healthy        01/19/23 1300           Cardiac and Carb modified diet DISCHARGE MEDICATION: Allergies as of 01/19/2023   No Known Allergies      Medication List     TAKE these medications    aspirin EC 81 MG tablet Take 1 tablet (81 mg total) by mouth daily at 6 (six) AM. Swallow whole.   atorvastatin 40 MG tablet Commonly known as: LIPITOR Take 40 mg by mouth daily.   budesonide-formoterol 160-4.5 MCG/ACT inhaler Commonly known as: Symbicort Inhale 2 puffs into the lungs 2 (two) times daily. Rinse mouth with water after each use   clopidogrel 75 MG tablet Commonly known as: PLAVIX Take 75 mg by mouth daily.   eucerin lotion Apply 1 Application topically as needed for dry skin.   mineral oil-hydrophilic petrolatum ointment Apply 1 Application topically at bedtime.   ferrous sulfate 325 (65 FE) MG tablet Take 1 tablet (325 mg total) by mouth daily with breakfast. Start taking on: January 20, 2023   losartan 50 MG tablet Commonly known as: COZAAR Take 50 mg by mouth in the morning and at bedtime.   Melatonin 10 MG Tabs Take 10 mg by mouth at bedtime.   metFORMIN 1000 MG tablet Commonly known as: GLUCOPHAGE Take 1,000 mg by mouth 2 (two) times daily with a meal.   multivitamin

## 2023-01-19 NOTE — Procedures (Signed)
Small Bowel pill cam study  Indication 77 year old male with unexplained anemia. EGD 01/18/2023: Unremarkable Colonoscopy 01/18/2023: Poor prep, no active bleeding, diverticulosis in sigmoid and descending  Procedure information and findings: After obtaining an informed consent, video capsule was placed in the duodenal bulb during endoscopy. It was noted in the small bowel at 2 minutes and exited into the cecum at 3 hours and 38 minutes. There was no evidence of active or recent bleeding noted in the small bowel. Small bowel mucosa appeared unremarkable.  Summary and recommendations: Normal small bowel PillCam study.  Okay to resume home medications. Patient will need a colonoscopy in 2 months with a 2-day prep, hopefully when Plavix can be held for at least 5 days prior to procedure. Okay to DC home.

## 2023-01-19 NOTE — Progress Notes (Signed)
Pt is to be discharge home to self care.  Reviewed AVS, changes to medications and informed pt that he had pending prescriptions he needed to be picked up from his preferred pharmacy.  Made pt aware that he had f/u appointment with his PCP.  Assisted pt in getting dressed and gathering his belongings.  Removed PIV which was CDI and free from S/Sx of infection.  Pt walked off the unit accompanied by his family to the visitor's entrance where his ride awaits to take him home.  Pt discharged in stable condition

## 2023-01-19 NOTE — Plan of Care (Signed)
Problem: Education: Goal: Knowledge of General Education information will improve Description: Including pain rating scale, medication(s)/side effects and non-pharmacologic comfort measures Outcome: Progressing Pt was admitted into the hospital for near syncopal episode at church and hypotension.  He is currently awaiting discharge home to self care.   Problem: Clinical Measurements: Goal: Will remain free from infection Outcome: Progressing S/Sx of infection monitored and assessed q-shift.  Pt has remained afebrile thus far.     Problem: Clinical Measurements: Goal: Respiratory complications will improve Outcome: Progressing Respiratory status monitored and assessed q-shift.  Pt is on room air with PO2 at 95-98% and respiration rate of 18 breaths per minute.  Pt has not endorsed c/o SOB or DOE.    Problem: Clinical Measurements: Goal: Cardiovascular complication will be avoided Outcome: Progressing Pt's BP was elevated this shift.  Nelson Chimes, MD is aware.  Pt is on antihypertensives.     Problem: Activity: Goal: Risk for activity intolerance will decrease Outcome: Progressing Pt is independent of all her ADLs.  He can get up OOB independently with a steady gait.  Problem: Nutrition: Goal: Adequate nutrition will be maintained Outcome: Progressing Pt is on a carb modified diet per MD's orders and can tolerate it w/o s/sx of abdominal pain/ distention or n/v.    Problem: Safety: Goal: Ability to remain free from injury will improve Outcome: Progressing Pt has remained free from falls thus far.  Instructed pt to utilize RN call light for assistance.  Hourly rounds performed.  Bed in lowest position, locked with two upper side rails engaged.  Belongings and call light within reach.    Problem: Skin Integrity: Goal: Risk for impaired skin integrity will decrease Outcome: Progressing Skin integrity monitored and assessed q-shift.  Instructed pt to turn q2 hours to prevent further skin  impairment.  Tubes and drains assessed for device related pressures sores.  Pt is continent of both bowel and bladder.

## 2023-01-21 ENCOUNTER — Ambulatory Visit: Payer: Medicare Other | Admitting: Vascular Surgery

## 2023-01-21 ENCOUNTER — Encounter (HOSPITAL_COMMUNITY): Payer: Self-pay | Admitting: Gastroenterology

## 2023-01-21 DIAGNOSIS — G8929 Other chronic pain: Secondary | ICD-10-CM | POA: Diagnosis not present

## 2023-01-21 DIAGNOSIS — M545 Low back pain, unspecified: Secondary | ICD-10-CM | POA: Diagnosis not present

## 2023-01-21 DIAGNOSIS — I700 Atherosclerosis of aorta: Secondary | ICD-10-CM | POA: Diagnosis not present

## 2023-01-21 DIAGNOSIS — R001 Bradycardia, unspecified: Secondary | ICD-10-CM | POA: Diagnosis not present

## 2023-01-21 DIAGNOSIS — I6522 Occlusion and stenosis of left carotid artery: Secondary | ICD-10-CM | POA: Diagnosis not present

## 2023-01-21 DIAGNOSIS — I739 Peripheral vascular disease, unspecified: Secondary | ICD-10-CM | POA: Diagnosis not present

## 2023-01-21 DIAGNOSIS — E11621 Type 2 diabetes mellitus with foot ulcer: Secondary | ICD-10-CM | POA: Diagnosis not present

## 2023-01-21 DIAGNOSIS — E1151 Type 2 diabetes mellitus with diabetic peripheral angiopathy without gangrene: Secondary | ICD-10-CM | POA: Diagnosis not present

## 2023-01-21 DIAGNOSIS — E782 Mixed hyperlipidemia: Secondary | ICD-10-CM | POA: Diagnosis not present

## 2023-01-21 DIAGNOSIS — I951 Orthostatic hypotension: Secondary | ICD-10-CM | POA: Diagnosis not present

## 2023-01-21 DIAGNOSIS — D509 Iron deficiency anemia, unspecified: Secondary | ICD-10-CM | POA: Diagnosis not present

## 2023-01-21 DIAGNOSIS — R195 Other fecal abnormalities: Secondary | ICD-10-CM | POA: Diagnosis not present

## 2023-02-14 DIAGNOSIS — I10 Essential (primary) hypertension: Secondary | ICD-10-CM | POA: Diagnosis not present

## 2023-02-14 DIAGNOSIS — D509 Iron deficiency anemia, unspecified: Secondary | ICD-10-CM | POA: Diagnosis not present

## 2023-02-14 DIAGNOSIS — I951 Orthostatic hypotension: Secondary | ICD-10-CM | POA: Diagnosis not present

## 2023-02-22 ENCOUNTER — Ambulatory Visit (HOSPITAL_COMMUNITY): Payer: Medicare Other

## 2023-02-22 DIAGNOSIS — I10 Essential (primary) hypertension: Secondary | ICD-10-CM | POA: Diagnosis not present

## 2023-03-02 DIAGNOSIS — I10 Essential (primary) hypertension: Secondary | ICD-10-CM | POA: Diagnosis not present

## 2023-03-02 DIAGNOSIS — Z79899 Other long term (current) drug therapy: Secondary | ICD-10-CM | POA: Diagnosis not present

## 2023-03-09 DIAGNOSIS — I4510 Unspecified right bundle-branch block: Secondary | ICD-10-CM | POA: Diagnosis not present

## 2023-03-09 DIAGNOSIS — R9431 Abnormal electrocardiogram [ECG] [EKG]: Secondary | ICD-10-CM | POA: Diagnosis not present

## 2023-03-09 DIAGNOSIS — I10 Essential (primary) hypertension: Secondary | ICD-10-CM | POA: Diagnosis not present

## 2023-03-09 DIAGNOSIS — R42 Dizziness and giddiness: Secondary | ICD-10-CM | POA: Diagnosis not present

## 2023-03-16 DIAGNOSIS — D649 Anemia, unspecified: Secondary | ICD-10-CM | POA: Diagnosis not present

## 2023-03-16 DIAGNOSIS — K625 Hemorrhage of anus and rectum: Secondary | ICD-10-CM | POA: Diagnosis not present

## 2023-03-17 ENCOUNTER — Encounter: Payer: Self-pay | Admitting: Adult Health

## 2023-03-17 ENCOUNTER — Ambulatory Visit: Payer: Medicare Other | Admitting: Adult Health

## 2023-03-17 VITALS — BP 128/78 | HR 96 | Temp 97.7°F | Resp 20 | Ht 71.0 in | Wt 185.0 lb

## 2023-03-17 DIAGNOSIS — Z125 Encounter for screening for malignant neoplasm of prostate: Secondary | ICD-10-CM

## 2023-03-17 DIAGNOSIS — Z8719 Personal history of other diseases of the digestive system: Secondary | ICD-10-CM | POA: Diagnosis not present

## 2023-03-17 DIAGNOSIS — Z113 Encounter for screening for infections with a predominantly sexual mode of transmission: Secondary | ICD-10-CM

## 2023-03-17 DIAGNOSIS — I739 Peripheral vascular disease, unspecified: Secondary | ICD-10-CM | POA: Diagnosis not present

## 2023-03-17 DIAGNOSIS — K219 Gastro-esophageal reflux disease without esophagitis: Secondary | ICD-10-CM

## 2023-03-17 DIAGNOSIS — E785 Hyperlipidemia, unspecified: Secondary | ICD-10-CM

## 2023-03-17 DIAGNOSIS — I10 Essential (primary) hypertension: Secondary | ICD-10-CM

## 2023-03-17 DIAGNOSIS — J45909 Unspecified asthma, uncomplicated: Secondary | ICD-10-CM

## 2023-03-17 DIAGNOSIS — D500 Iron deficiency anemia secondary to blood loss (chronic): Secondary | ICD-10-CM | POA: Diagnosis not present

## 2023-03-17 DIAGNOSIS — M545 Low back pain, unspecified: Secondary | ICD-10-CM

## 2023-03-17 DIAGNOSIS — E1169 Type 2 diabetes mellitus with other specified complication: Secondary | ICD-10-CM

## 2023-03-17 DIAGNOSIS — Z7689 Persons encountering health services in other specified circumstances: Secondary | ICD-10-CM

## 2023-03-17 NOTE — Progress Notes (Signed)
[facility]  Provider:  Kenard Gower DNP  Code Status:  Full Code  Goals of Care:     03/17/2023    1:08 PM  Advanced Directives  Does Patient Have a Medical Advance Directive? Yes  Type of Estate agent of [place];Living will;Out of facility DNR (pink MOST or yellow form)  Does patient want to make changes to medical advance directive? No - Patient declined  Copy of Healthcare Power of Attorney in Chart? No - copy requested     Chief Complaint  Patient presents with   Establish Care     New patient     HPI: Patient is a 77 y.o. male seen today to establish care with [facility]. He is divorced and has no children. He completed 3 years of college. He is now retired and owns a Advertising account executive. He stopped smoking 30 years ago and had his last drink of beer 3 months ago. He is diabetic and currently on Metformin and Tresiba. Average blood sugar at home is 170. He goes to an eye doctor in [place] (forgot the name). He walks daily for 2 hours.  Had blood in stool a week ago, goes to Dr. Marca Ancona, GI. He had colonoscopy on 01/18/23 with findings:  hemorrhoids and diverticulosis of large intestine without perforation or abscess without bleeding. EGD and capsule endoscopy was normal.  Has pain on both feet, 7/10 pain, follows up with [facility]. He has a history of femoropopliteal bypass with stent placement (10/14/21). He currently takes Plavix and aspirin. He was told that his carotid artery, left, is partially blocked and will have surgery but waiting for hgb to go up to 11.5, has dizzy spells. Takes Tylenol ES 5 pills at one time. Discussed that he should stopped taking 5 pills all at the same time.  He was hospitalized 10/06 to 01/19/23 for orthostatic hypotension.  He was was hospitalized (ED) in [place] due to elevated BP on 03/09/23 and was given Cozaar 50 mg BID, which is what he takes regularly.  He sometimes have SOB and wheezing so  was prescribed Budesonide-formeterol.   Past Medical History:  Diagnosis Date   Diabetes mellitus without complication (HCC)    Dysrhythmia    chronic RBBB; bradycardia s/p atropine following sheath removal, 3.8 second sinus pause 10/11/22   Hypertension    Peripheral artery disease [facility])     Past Surgical History:  Procedure Laterality Date   "stents in both my legs"     ABDOMINAL AORTOGRAM W/LOWER EXTREMITY N/A 10/11/2022   Procedure: ABDOMINAL AORTOGRAM W/LOWER EXTREMITY;  Surgeon: Chuck Hint, MD;  Location: [facility] INVASIVE CV LAB;  Service: Cardiovascular;  Laterality: N/A;   COLONOSCOPY WITH PROPOFOL N/A 01/18/2023   Procedure: COLONOSCOPY WITH PROPOFOL;  Surgeon: Kerin Salen, MD;  Location: [facility] ENDOSCOPY;  Service: Gastroenterology;  Laterality: N/A;   ENDARTERECTOMY FEMORAL Left 10/15/2022   Procedure: ENDARTERECTOMY OF LEFT ILIOFEMORAL ARTERY;  Surgeon: Chuck Hint, MD;  [facility];  Service: Vascular;  Laterality: Left;   ESOPHAGOGASTRODUODENOSCOPY (EGD) WITH PROPOFOL N/A 01/18/2023   Procedure: ESOPHAGOGASTRODUODENOSCOPY (EGD) WITH PROPOFOL;  Surgeon: Kerin Salen, MD;  Location: [facility] ENDOSCOPY;  Service: Gastroenterology;  Laterality: N/A;   FEMORAL-POPLITEAL BYPASS GRAFT Left 10/15/2022   Procedure: LEFT FEMORAL-BELOW KNEE POPLITEAL ARTERY BYPASS WITH VEIN HARVEST OF LEFT GREATER SAPHENOUS VEIN;  Surgeon: Chuck Hint, MD;  Location: [facility] OR;  Service: Vascular;  Laterality: Left;   GIVENS CAPSULE STUDY N/A 04/10/2017  Procedure: GIVENS CAPSULE STUDY;  Surgeon: Charlott Rakes, MD;  Location: [facility] ENDOSCOPY;  Service: Endoscopy;  Laterality: N/A;   GIVENS CAPSULE STUDY N/A 01/18/2023   Procedure: GIVENS CAPSULE STUDY;  Surgeon: Kerin Salen, MD;  Location: [facility] ENDOSCOPY;  Service: Gastroenterology;  Laterality: N/A;   IR GENERIC HISTORICAL  08/26/2015   IR RADIOLOGIST EVAL & MGMT 08/26/2015 Malachy Moan, MD [facility] INTERV RAD   IR GENERIC HISTORICAL   04/21/2016   IR RADIOLOGIST EVAL & MGMT 04/21/2016 Malachy Moan, MD [facility] INTERV RAD   [facility] ANGIOPLASTY Left 10/15/2022   Procedure: PATCH ANGIOPLASTY USING XENOSURE BIOLOGIC PATCH 1CMX14CM;  Surgeon: Chuck Hint, MD;  Location: [facility] OR;  Service: Vascular;  Laterality: Left;   PERIPHERAL VASCULAR INTERVENTION  10/11/2022   Procedure: PERIPHERAL VASCULAR INTERVENTION;  Surgeon: Chuck Hint, MD;  Location: [facility] INVASIVE CV LAB;  Service: Cardiovascular;;    No Known Allergies  Outpatient Encounter Medications as of 03/17/2023  Medication Sig   aspirin EC 81 MG tablet Take 1 tablet (81 mg total) by mouth daily at 6 (six) AM. Swallow whole.   atorvastatin (LIPITOR) 40 MG tablet Take 40 mg by mouth daily.   budesonide-formoterol (SYMBICORT) 160-4.5 MCG/ACT inhaler Inhale 2 puffs into the lungs 2 (two) times daily. Rinse mouth with water after each use   clopidogrel (PLAVIX) 75 MG tablet Take 75 mg by mouth daily.   Emollient (EUCERIN) lotion Apply 1 Application topically as needed for dry skin.   ferrous sulfate 325 (65 FE) MG tablet Take 1 tablet (325 mg total) by mouth daily with breakfast.   losartan (COZAAR) 50 MG tablet Take 50 mg by mouth in the morning and at bedtime.   Melatonin 10 MG TABS Take 10 mg by mouth at bedtime.   metFORMIN (GLUCOPHAGE) 1000 MG tablet Take 1,000 mg by mouth 2 (two) times daily with a meal.   mineral oil-hydrophilic petrolatum (AQUAPHOR) ointment Apply 1 Application topically at bedtime.   Multiple Vitamin (MULTIVITAMIN WITH MINERALS) TABS tablet Take 1 tablet by mouth daily.   pantoprazole (PROTONIX) 40 MG tablet Take 40 mg by mouth daily.   TRESIBA FLEXTOUCH 100 UNIT/ML FlexTouch Pen Inject 38 Units into the skin daily.   No facility-administered encounter medications on file as of 03/17/2023.    Review of Systems:  Review of Systems  Constitutional:  Negative for activity change, appetite change and fever.  HENT:  Negative for sore  throat.   Eyes: Negative.   Cardiovascular:  Negative for chest pain and leg swelling.  Gastrointestinal:  Negative for abdominal distention, diarrhea and vomiting.  Genitourinary:  Negative for dysuria, frequency and urgency.  Skin:  Negative for color change.  Neurological:  Positive for dizziness. Negative for headaches.  Psychiatric/Behavioral:  Negative for behavioral problems and sleep disturbance. The patient is not nervous/anxious.     Health Maintenance  Topic Date Due   OPHTHALMOLOGY EXAM  Never done   Diabetic kidney evaluation - Urine ACR  Never done   Hepatitis C Screening  Never done   Pneumonia Vaccine 40+ Years old (2 of 2 - PCV) 06/11/2007   DTaP/Tdap/Td (2 - Tdap) 03/12/2013   Zoster Vaccines- Shingrix (2 of 2) 05/19/2018   COVID-19 Vaccine (3 - Pfizer risk series) 08/07/2019   INFLUENZA VACCINE  11/11/2022   Medicare Annual Wellness (AWV)  02/09/2023   HEMOGLOBIN A1C  04/13/2023   Diabetic kidney evaluation - eGFR measurement  01/17/2024   FOOT EXAM  03/16/2024   HPV VACCINES  Aged  Out   Colonoscopy  Discontinued    Physical Exam: Vitals:   03/17/23 1311  BP: 128/78  Pulse: 96  Resp: 20  Temp: 97.7 F (36.5 C)  SpO2: 96%  Weight: 185 lb (83.9 kg)  Height: 5\' 11"  (1.803 m)   Body mass index is 25.8 kg/m. Physical Exam Constitutional:      Appearance: Normal appearance.  HENT:     Head: Normocephalic and atraumatic.     Mouth/Throat:     Mouth: Mucous membranes are moist.  Eyes:     Conjunctiva/sclera: Conjunctivae normal.  Cardiovascular:     Rate and Rhythm: Normal rate and regular rhythm.     Pulses: Normal pulses.     Heart sounds: Normal heart sounds.  Pulmonary:     Effort: Pulmonary effort is normal.     Breath sounds: Normal breath sounds.  Abdominal:     General: Bowel sounds are normal.     Palpations: Abdomen is soft.  Musculoskeletal:        General: No swelling. Normal range of motion.     Cervical back: Normal range of  motion.  Skin:    General: Skin is warm and dry.  Neurological:     General: No focal deficit present.     Mental Status: He is alert and oriented to person, place, and time.  Psychiatric:        Mood and Affect: Mood normal.        Behavior: Behavior normal.        Thought Content: Thought content normal.        Judgment: Judgment normal.     Labs reviewed: Basic Metabolic Panel: Recent Labs    10/11/22 1625 10/11/22 1626 10/15/22 0634 10/17/22 0117 01/16/23 1325 01/17/23 0813  NA  --  137   < > 135 138 140  K  --  4.1   < > 3.8 3.8 3.7  CL  --  106   < > 100 100 106  CO2  --  25   < > 25 26 25   GLUCOSE  --  265*   < > 163* 103* 99  BUN  --  16   < > 22 17 10   CREATININE  --  0.80   < > 1.07 1.03 0.81  CALCIUM  --  8.3*   < > 8.3* 8.5* 8.2*  MG  --  1.8  --   --   --   --   TSH 0.362  --   --   --   --   --    < > = values in this interval not displayed.   Liver Function Tests: Recent Labs    10/15/22 0634 01/16/23 1325  AST 28 21  ALT 24 16  ALKPHOS 76 60  BILITOT 0.7 0.5  PROT 7.8 6.8  ALBUMIN 4.2 3.7   No results for input(s): "LIPASE", "AMYLASE" in the last 8760 hours. No results for input(s): "AMMONIA" in the last 8760 hours. CBC: Recent Labs    10/16/22 1615 10/17/22 0117 01/16/23 1325 01/17/23 0813 01/18/23 0447 01/19/23 0443  WBC 15.7*   < > 10.1 6.3 6.4 7.9  NEUTROABS 10.8*  --  7.8*  --   --   --   HGB 8.5*   < > 7.9* 7.5* 7.4* 7.9*  HCT 24.9*   < > 26.4* 24.9* 24.1* 25.5*  MCV 88.3   < > 84.3 85.3 84.3 83.6  PLT 185   < >  262 272 252 264   < > = values in this interval not displayed.   Lipid Panel: Recent Labs    10/12/22 0107 10/16/22 0306  CHOL 109 69  HDL 36* 25*  LDLCALC 49 25  TRIG 121 93  CHOLHDL 3.0 2.8   Lab Results  Component Value Date   HGBA1C 6.5 (H) 10/11/2022    Procedures since last visit: No results found.  Assessment/Plan  1. Encounter to establish care -  established care with [facility]  2. History of  GI bleed -   Continue pantoprazole -   Follow-up with GI  3. Iron deficiency anemia due to chronic blood loss -   Continue ferrous sulfate -   CBC with Differential/Platelets; Future  4. Primary hypertension -  BP 128/78, stable -   Continue losartan and hydrochlorothiazide  5. Screen for STD (sexually transmitted disease) - Hep C Antibody; Future - HIV antibody (with reflex); Future  6. Type 2 diabetes mellitus with other specified complication, without long-term current use of insulin (HCC) -Followed H blood sugar at home 170 -Continue metformin and Tresiba - Hemoglobin A1C; Future - Microalbumin/Creatinine Ratio, Urine; Future - Ambulatory referral to Podiatry  7. Hyperlipidemia, unspecified hyperlipidemia type -   Continue atorvastatin - Lipid panel; Future  8. Prostate cancer screening - PSA; Future  9. PAD (peripheral artery disease) (HCC) -   Continue Plavix and aspirin -   Follow-up with vascular surgery  10.  Moderate asthma, unspecified whether complicated, unspecified whether persistent -  no wheezing -  continue Symbicort     Labs/tests ordered: A1c, CBC, hep C and HIV antibody, lipid panel, urine microalbumin and PSA  Next appt:  Visit date not found

## 2023-03-17 NOTE — Patient Instructions (Signed)
Preventive Care 65 Years and Older, Male Preventive care refers to lifestyle choices and visits with your health care provider that can promote health and wellness. Preventive care visits are also called wellness exams. What can I expect for my preventive care visit? Counseling During your preventive care visit, your health care provider may ask about your: Medical history, including: Past medical problems. Family medical history. History of falls. Current health, including: Emotional well-being. Home life and relationship well-being. Sexual activity. Memory and ability to understand (cognition). Lifestyle, including: Alcohol, nicotine or tobacco, and drug use. Access to firearms. Diet, exercise, and sleep habits. Work and work environment. Sunscreen use. Safety issues such as seatbelt and bike helmet use. Physical exam Your health care provider will check your: Height and weight. These may be used to calculate your BMI (body mass index). BMI is a measurement that tells if you are at a healthy weight. Waist circumference. This measures the distance around your waistline. This measurement also tells if you are at a healthy weight and may help predict your risk of certain diseases, such as type 2 diabetes and high blood pressure. Heart rate and blood pressure. Body temperature. Skin for abnormal spots. What immunizations do I need?  Vaccines are usually given at various ages, according to a schedule. Your health care provider will recommend vaccines for you based on your age, medical history, and lifestyle or other factors, such as travel or where you work. What tests do I need? Screening Your health care provider may recommend screening tests for certain conditions. This may include: Lipid and cholesterol levels. Diabetes screening. This is done by checking your blood sugar (glucose) after you have not eaten for a while (fasting). Hepatitis C test. Hepatitis B test. HIV (human  immunodeficiency virus) test. STI (sexually transmitted infection) testing, if you are at risk. Lung cancer screening. Colorectal cancer screening. Prostate cancer screening. Abdominal aortic aneurysm (AAA) screening. You may need this if you are a current or former smoker. Talk with your health care provider about your test results, treatment options, and if necessary, the need for more tests. Follow these instructions at home: Eating and drinking  Eat a diet that includes fresh fruits and vegetables, whole grains, lean protein, and low-fat dairy products. Limit your intake of foods with high amounts of sugar, saturated fats, and salt. Take vitamin and mineral supplements as recommended by your health care provider. Do not drink alcohol if your health care provider tells you not to drink. If you drink alcohol: Limit how much you have to 0-2 drinks a day. Know how much alcohol is in your drink. In the [place] one drink equals one 12 oz bottle of beer (355 mL), one 5 oz glass of wine (148 mL), or one 1 oz glass of hard liquor (44 mL). Lifestyle Brush your teeth every morning and night with fluoride toothpaste. Floss one time each day. Exercise for at least 30 minutes 5 or more days each week. Do not use any products that contain nicotine or tobacco. These products include cigarettes, chewing tobacco, and vaping devices, such as e-cigarettes. If you need help quitting, ask your health care provider. Do not use drugs. If you are sexually active, practice safe sex. Use a condom or other form of protection to prevent STIs. Take aspirin only as told by your health care provider. Make sure that you understand how much to take and what form to take. Work with your health care provider to find out whether it is safe   and beneficial for you to take aspirin daily. Ask your health care provider if you need to take a cholesterol-lowering medicine (statin). Find healthy ways to manage stress, such  as: Meditation, yoga, or listening to music. Journaling. Talking to a trusted person. Spending time with friends and family. Safety Always wear your seat belt while driving or riding in a vehicle. Do not drive: If you have been drinking alcohol. Do not ride with someone who has been drinking. When you are tired or distracted. While texting. If you have been using any mind-altering substances or drugs. Wear a helmet and other protective equipment during sports activities. If you have firearms in your house, make sure you follow all gun safety procedures. Minimize exposure to UV radiation to reduce your risk of skin cancer. What's next? Visit your health care provider once a year for an annual wellness visit. Ask your health care provider how often you should have your eyes and teeth checked. Stay up to date on all vaccines. This information is not intended to replace advice given to you by your health care provider. Make sure you discuss any questions you have with your health care provider. Document Revised: 09/24/2020 Document Reviewed: 09/24/2020 Elsevier Patient Education  2024 Elsevier Inc.  

## 2023-03-18 ENCOUNTER — Other Ambulatory Visit: Payer: Medicare Other

## 2023-03-18 ENCOUNTER — Other Ambulatory Visit: Payer: Self-pay

## 2023-03-18 DIAGNOSIS — Z113 Encounter for screening for infections with a predominantly sexual mode of transmission: Secondary | ICD-10-CM

## 2023-03-18 DIAGNOSIS — E1169 Type 2 diabetes mellitus with other specified complication: Secondary | ICD-10-CM

## 2023-03-18 DIAGNOSIS — E785 Hyperlipidemia, unspecified: Secondary | ICD-10-CM | POA: Diagnosis not present

## 2023-03-18 DIAGNOSIS — D500 Iron deficiency anemia secondary to blood loss (chronic): Secondary | ICD-10-CM | POA: Diagnosis not present

## 2023-03-18 DIAGNOSIS — Z125 Encounter for screening for malignant neoplasm of prostate: Secondary | ICD-10-CM

## 2023-03-19 LAB — PSA: PSA: 0.94 ng/mL (ref <=4.00)

## 2023-03-19 LAB — MICROALBUMIN / CREATININE URINE RATIO
Creatinine, Urine: 108 mg/dL (ref 20–320)
Microalb Creat Ratio: 29 mg/g{creat} (ref <30)
Microalb, Ur: 3.1 mg/dL

## 2023-03-19 LAB — LIPID PANEL
Cholesterol: 124 mg/dL (ref <200)
HDL: 44 mg/dL (ref >=40)
LDL Cholesterol (Calc): 60 mg/dL
Non-HDL Cholesterol (Calc): 80 mg/dL (ref <130)
Total CHOL/HDL Ratio: 2.8 (calc) (ref <5.0)
Triglycerides: 114 mg/dL (ref <150)

## 2023-03-19 LAB — CBC WITH DIFFERENTIAL/PLATELET
Absolute Lymphocytes: 1203 {cells}/uL (ref 850–3900)
Absolute Monocytes: 975 {cells}/uL — ABNORMAL HIGH (ref 200–950)
Basophils Absolute: 59 {cells}/uL (ref 0–200)
Basophils Relative: 0.9 %
Eosinophils Absolute: 169 {cells}/uL (ref 15–500)
Eosinophils Relative: 2.6 %
HCT: 26.6 % — ABNORMAL LOW (ref 38.5–50.0)
Hemoglobin: 8.4 g/dL — ABNORMAL LOW (ref 13.2–17.1)
MCH: 26.3 pg — ABNORMAL LOW (ref 27.0–33.0)
MCHC: 31.6 g/dL — ABNORMAL LOW (ref 32.0–36.0)
MCV: 83.4 fL (ref 80.0–100.0)
MPV: 10.7 fL (ref 7.5–12.5)
Monocytes Relative: 15 %
Neutro Abs: 4095 {cells}/uL (ref 1500–7800)
Neutrophils Relative %: 63 %
Platelets: 327 10*3/uL (ref 140–400)
RBC: 3.19 10*6/uL — ABNORMAL LOW (ref 4.20–5.80)
RDW: 16.9 % — ABNORMAL HIGH (ref 11.0–15.0)
Total Lymphocyte: 18.5 %
WBC: 6.5 10*3/uL (ref 3.8–10.8)

## 2023-03-19 LAB — HEMOGLOBIN A1C
Hgb A1c MFr Bld: 6.2 %{Hb} — ABNORMAL HIGH (ref <5.7)
Mean Plasma Glucose: 131 mg/dL
eAG (mmol/L): 7.3 mmol/L

## 2023-03-19 LAB — HEPATITIS C ANTIBODY: Hepatitis C Ab: NONREACTIVE

## 2023-03-19 LAB — HIV ANTIBODY (ROUTINE TESTING W REFLEX): HIV 1&2 Ab, 4th Generation: NONREACTIVE

## 2023-03-22 ENCOUNTER — Telehealth: Payer: Self-pay

## 2023-03-22 NOTE — Telephone Encounter (Signed)
Provider reply:   Gillis Santa, NP  You1 hour ago (1:13 PM)    Pls discontinue Pravastatin and continue Atorvastatin.  Needs to continue Symbicort.  Monina   1.) Please advise The symbicort is too expensive, patient can not afford.   2.) Advise if patient is to take Multivitamin  3.) Advise if patient should use aquaphor  Needing a reply on 1, 2, and 3 concerns

## 2023-03-22 NOTE — Progress Notes (Signed)
-     Urine microalbumin/creatinine ratio and PSA normal -Hemoglobin 8.4, up from 7.9 (taken 01/19/2023), continue ferrous sulfate -A1c 6.2, improved from 6.5, continue current meds -   Hep C and HIV antibody negative -   Lipid panel normal, continue atorvastatin and discontinue pravastatin

## 2023-03-22 NOTE — Telephone Encounter (Signed)
Patient called stating he was told to call back to verify his medications. Below are the findings from reviewing medications.   1.) Patient is currently taking pravastatin 40 and atorvastatin 40 mg once daily on both.   2.) Patient would also like to know if he should or should not taking or using:  Multivitamin, symbicort (stopped due to cost), and Aquaphor   Please advise

## 2023-03-22 NOTE — Telephone Encounter (Signed)
Provider response:   Medina-Vargas, Monina C, NP  You24 minutes ago (3:06 PM)    1. Symbicort needs to be continued for asthma. Pls call your insurance what they would cover to replace Symbicort.  2.  Continue Multivitamins and Aquaphor.    Left message on voicemail for patient to return call when available  (see lab results also in the [facility] Clinical Pool)

## 2023-03-22 NOTE — Telephone Encounter (Signed)
Message left on clinical intake voicemail yesterday 03/21/23:  Patient was calling to speak with a medical assistant to verify medication as requested at his last appointment.   Call returned to patient: Left message on voicemail for patient to return call when available .

## 2023-03-23 NOTE — Telephone Encounter (Signed)
Patient is aware of providers response to all concerns.

## 2023-03-25 ENCOUNTER — Ambulatory Visit: Payer: Medicare Other | Admitting: Podiatry

## 2023-03-29 ENCOUNTER — Other Ambulatory Visit: Payer: Self-pay

## 2023-03-29 MED ORDER — METFORMIN HCL 1000 MG PO TABS: 1000.0000 mg | ORAL_TABLET | Freq: Two times a day (BID) | ORAL | 1 refills | Status: DC

## 2023-03-30 ENCOUNTER — Telehealth: Payer: Self-pay

## 2023-03-30 DIAGNOSIS — E1169 Type 2 diabetes mellitus with other specified complication: Secondary | ICD-10-CM

## 2023-03-30 NOTE — Telephone Encounter (Signed)
Patient called to say he is using a glucometer and would like it added to his medication list.  Patient states that he checks his blood sugar between 1-3 times daily

## 2023-04-01 ENCOUNTER — Telehealth: Payer: Self-pay

## 2023-04-01 DIAGNOSIS — I6521 Occlusion and stenosis of right carotid artery: Secondary | ICD-10-CM

## 2023-04-01 DIAGNOSIS — R0989 Other specified symptoms and signs involving the circulatory and respiratory systems: Secondary | ICD-10-CM

## 2023-04-01 NOTE — Progress Notes (Signed)
Fax received from [facility] on 03/31/23 for medical clearance/medication hold for flex sigmoidoscopy to be signed by B. Randie Heinz, MD.  Provider signed on 03/31/23, scanned into pt's chart, media routed via MyChart to sender on 04/01/23.

## 2023-04-01 NOTE — Telephone Encounter (Addendum)
Pt called asking to speak with a nurse.  Reviewed pt's chart, returned call for clarification, no answer, lf vm.  Pt returned call, two identifiers used. Pt expressed extreme frustration with how he's been treated. He's been SOB, fatigued, multiple syncopal episodes daily, experienced falls, stumbles when ambulating, and foggy brain.  He feels that his PCP is not taking aggressive enough action to get his HGB up to appropriate levels for the TCAR he needs for carotid stenosis. No bleeding noted with his GI studies and he has been prescribed Fe tablets to take daily. He is concerned that at this rate, it will be months before his HGB is high enough to be safe for the surgery.   Explained that this office needs the CTA of the neck to obtain the information needed for the surgery. Instructed him to contact his PCP and let them know that he is at high risk of stroke, so he needs this surgery asap. Order placed for CTA neck and f/u with Dr. Hetty Blend on 04/15/23. Confirmed understanding.

## 2023-04-01 NOTE — Addendum Note (Signed)
Addended by: Leilani Able, Litzy Dicker A on: 04/01/2023 03:31 PM   Modules accepted: Orders

## 2023-04-11 NOTE — Telephone Encounter (Signed)
ERROR    This encounter was created in error - please disregard.

## 2023-04-15 ENCOUNTER — Ambulatory Visit: Payer: Medicare Other | Admitting: Vascular Surgery

## 2023-04-18 ENCOUNTER — Other Ambulatory Visit: Payer: Self-pay

## 2023-04-18 DIAGNOSIS — E1169 Type 2 diabetes mellitus with other specified complication: Secondary | ICD-10-CM

## 2023-04-18 MED ORDER — FREESTYLE LIBRE 2 SENSOR MISC: 2.0000 | Freq: Every day | 11 refills | Status: DC

## 2023-04-18 MED ORDER — FREESTYLE LIBRE 2 READER DEVI: 1.0000 | Freq: Three times a day (TID) | 11 refills | Status: DC

## 2023-04-19 ENCOUNTER — Encounter (HOSPITAL_COMMUNITY): Payer: Self-pay | Admitting: Radiology

## 2023-04-19 ENCOUNTER — Ambulatory Visit (HOSPITAL_COMMUNITY)
Admission: RE | Admit: 2023-04-19 | Discharge: 2023-04-19 | Disposition: A | Discharge status: Home / Self Care | Payer: Medicare Other | Source: Ambulatory Visit | Attending: Vascular Surgery | Admitting: Vascular Surgery

## 2023-04-19 DIAGNOSIS — R0989 Other specified symptoms and signs involving the circulatory and respiratory systems: Secondary | ICD-10-CM | POA: Diagnosis not present

## 2023-04-19 DIAGNOSIS — I6509 Occlusion and stenosis of unspecified vertebral artery: Secondary | ICD-10-CM | POA: Diagnosis not present

## 2023-04-19 DIAGNOSIS — I672 Cerebral atherosclerosis: Secondary | ICD-10-CM | POA: Diagnosis not present

## 2023-04-19 DIAGNOSIS — I6521 Occlusion and stenosis of right carotid artery: Secondary | ICD-10-CM | POA: Diagnosis not present

## 2023-04-19 DIAGNOSIS — I6523 Occlusion and stenosis of bilateral carotid arteries: Secondary | ICD-10-CM | POA: Diagnosis not present

## 2023-04-19 DIAGNOSIS — I708 Atherosclerosis of other arteries: Secondary | ICD-10-CM | POA: Diagnosis not present

## 2023-04-19 LAB — POCT I-STAT CREATININE
Creatinine, Ser: 1 mg/dL (ref 0.61–1.24)
Creatinine, Ser: 1 mg/dL (ref 0.61–1.24)

## 2023-04-19 MED ORDER — IOHEXOL 350 MG/ML SOLN
50.0000 mL | Freq: Once | INTRAVENOUS | Status: AC | PRN
  Administered 2023-04-19: 50 mL via INTRAVENOUS

## 2023-04-20 DIAGNOSIS — K635 Polyp of colon: Secondary | ICD-10-CM | POA: Diagnosis not present

## 2023-04-20 DIAGNOSIS — D509 Iron deficiency anemia, unspecified: Secondary | ICD-10-CM | POA: Diagnosis not present

## 2023-04-20 DIAGNOSIS — K5730 Diverticulosis of large intestine without perforation or abscess without bleeding: Secondary | ICD-10-CM | POA: Diagnosis not present

## 2023-04-20 DIAGNOSIS — D128 Benign neoplasm of rectum: Secondary | ICD-10-CM | POA: Diagnosis not present

## 2023-04-20 DIAGNOSIS — K648 Other hemorrhoids: Secondary | ICD-10-CM | POA: Diagnosis not present

## 2023-04-21 NOTE — Progress Notes (Signed)
 Patient ID: Randy Jacobson, male   DOB: 10-27-1945, 78 y.o.   MRN: [id number]  Reason for Consult: Follow-up   Referred by Shona Norleen PEDLAR, MD  Subjective:     HPI  Randy Jacobson is a 78 y.o. male patient is a 78 year old male who presents for evaluation of carotid stenosis.  He denies any previous TIA or strokelike symptoms.  Specifically he denies one-sided weakness, numbness, amaurosis or speech issues.  He had been followed by Dr. Melvenia in the past and has undergone multiple procedures for PAD.  He had a left femoral endarterectomy with a left femoral to below-knee popliteal artery bypass with reversed great saphenous vein.  He reports his toe wound has healed well and is ambulating without much pain.  He is a former smoker and quit in the 50s.  He is compliant with aspirin , Plavix  and Lipitor  Past Medical History:  Diagnosis Date   Diabetes mellitus without complication (HCC)    Dysrhythmia    chronic RBBB; bradycardia s/p atropine  following sheath removal, 3.8 second sinus pause 10/11/22   Hypertension    Peripheral artery disease (HCC)    Family History  Problem Relation Age of Onset   Diabetes Brother    Past Surgical History:  Procedure Laterality Date   stents in both my legs     ABDOMINAL AORTOGRAM W/LOWER EXTREMITY N/A 10/11/2022   Procedure: ABDOMINAL AORTOGRAM W/LOWER EXTREMITY;  Surgeon: Eliza Lonni RAMAN, MD;  Location: [facility] INVASIVE CV LAB;  Service: Cardiovascular;  Laterality: N/A;   COLONOSCOPY WITH PROPOFOL  N/A 01/18/2023   Procedure: COLONOSCOPY WITH PROPOFOL ;  Surgeon: Saintclair Jasper, MD;  Location: [facility] ENDOSCOPY;  Service: Gastroenterology;  Laterality: N/A;   ENDARTERECTOMY FEMORAL Left 10/15/2022   Procedure: ENDARTERECTOMY OF LEFT ILIOFEMORAL ARTERY;  Surgeon: Eliza Lonni RAMAN, MD;  Location: [facility] OR;  Service: Vascular;  Laterality: Left;   ESOPHAGOGASTRODUODENOSCOPY (EGD) WITH PROPOFOL  N/A 01/18/2023   Procedure: ESOPHAGOGASTRODUODENOSCOPY (EGD) WITH  PROPOFOL ;  Surgeon: Saintclair Jasper, MD;  Location: [facility] ENDOSCOPY;  Service: Gastroenterology;  Laterality: N/A;   FEMORAL-POPLITEAL BYPASS GRAFT Left 10/15/2022   Procedure: LEFT FEMORAL-BELOW KNEE POPLITEAL ARTERY BYPASS WITH VEIN HARVEST OF LEFT GREATER SAPHENOUS VEIN;  Surgeon: Eliza Lonni RAMAN, MD;  Location: [facility] OR;  Service: Vascular;  Laterality: Left;   GIVENS CAPSULE STUDY N/A 04/10/2017   Procedure: GIVENS CAPSULE STUDY;  Surgeon: Dianna Specking, MD;  Location: [facility] ENDOSCOPY;  Service: Endoscopy;  Laterality: N/A;   GIVENS CAPSULE STUDY N/A 01/18/2023   Procedure: GIVENS CAPSULE STUDY;  Surgeon: Saintclair Jasper, MD;  Location: [facility] ENDOSCOPY;  Service: Gastroenterology;  Laterality: N/A;   IR GENERIC HISTORICAL  08/26/2015   IR RADIOLOGIST EVAL & MGMT 08/26/2015 Wilkie Lent, MD [facility] INTERV RAD   IR GENERIC HISTORICAL  04/21/2016   IR RADIOLOGIST EVAL & MGMT 04/21/2016 Wilkie Lent, MD [facility] INTERV RAD   [facility] ANGIOPLASTY Left 10/15/2022   Procedure: PATCH ANGIOPLASTY USING XENOSURE BIOLOGIC PATCH 1CMX14CM;  Surgeon: Eliza Lonni RAMAN, MD;  Location: [facility] OR;  Service: Vascular;  Laterality: Left;   PERIPHERAL VASCULAR INTERVENTION  10/11/2022   Procedure: PERIPHERAL VASCULAR INTERVENTION;  Surgeon: Eliza Lonni RAMAN, MD;  Location: [facility] INVASIVE CV LAB;  Service: Cardiovascular;;    Short Social History:  Social History   Tobacco Use   Smoking status: Former    Current packs/day: 0.00    Average packs/day: 1 pack/day for 36.0 years (36.0 ttl pk-yrs)    Types: Cigarettes    Start date: 11/20/1957  Quit date: 11/20/1993    Years since quitting: 29.4   Smokeless tobacco: Never  Substance Use Topics   Alcohol use: Yes    Alcohol/week: 0.0 standard drinks of alcohol    Comment: occ    No Known Allergies  Current Outpatient Medications  Medication Sig Dispense Refill   albuterol  (VENTOLIN  HFA) 108 (90 Base) MCG/ACT inhaler Inhale 2 puffs into the lungs as needed for  wheezing or shortness of breath.     aspirin  EC 81 MG tablet Take 1 tablet (81 mg total) by mouth daily at 6 (six) AM. Swallow whole. 30 tablet 12   atorvastatin  (LIPITOR) 40 MG tablet Take 40 mg by mouth daily.     clopidogrel  (PLAVIX ) 75 MG tablet Take 75 mg by mouth daily.     Continuous Glucose Receiver (FREESTYLE LIBRE 2 READER) DEVI 1 Device by Does not apply route 3 (three) times daily. E11.69 1 each 11   Continuous Glucose Sensor (FREESTYLE LIBRE 2 SENSOR) MISC 2 Devices by Does not apply route daily. E11.69 2 each 11   Emollient (EUCERIN) lotion Apply 1 Application topically as needed for dry skin.     ferrous sulfate  325 (65 FE) MG tablet Take 1 tablet (325 mg total) by mouth daily with breakfast. 90 tablet 3   hydrochlorothiazide  (HYDRODIURIL ) 25 MG tablet Take 12.5 mg by mouth daily.     losartan  (COZAAR ) 50 MG tablet Take 50 mg by mouth in the morning and at bedtime.     Melatonin 10 MG TABS Take 10 mg by mouth at bedtime.     metFORMIN  (GLUCOPHAGE ) 1000 MG tablet Take 1 tablet (1,000 mg total) by mouth 2 (two) times daily with a meal. 180 tablet 1   pantoprazole  (PROTONIX ) 40 MG tablet Take 40 mg by mouth daily.     TRESIBA  FLEXTOUCH 100 UNIT/ML FlexTouch Pen Inject 38 Units into the skin daily.     budesonide -formoterol  (SYMBICORT ) 160-4.5 MCG/ACT inhaler Inhale 2 puffs into the lungs 2 (two) times daily. Rinse mouth with water after each use (Patient not taking: Reported on 03/22/2023) 1 each 1   mineral oil-hydrophilic petrolatum (AQUAPHOR) ointment Apply 1 Application topically at bedtime. (Patient not taking: Reported on 03/22/2023)     Multiple Vitamin (MULTIVITAMIN WITH MINERALS) TABS tablet Take 1 tablet by mouth daily. (Patient not taking: Reported on 03/22/2023)     No current facility-administered medications for this visit.    REVIEW OF SYSTEMS   All other systems were reviewed and are negative     Objective:  Objective   Vitals:   04/22/23 0853 04/22/23 0855   BP: (!) 178/62 (!) 166/78  Pulse: 65   Resp: 20   Temp: 98.3 F (36.8 C)   TempSrc: Temporal   SpO2: 96%   Weight: 184 lb 11.2 oz (83.8 kg)   Height: 5' 11 (1.803 m)    Body mass index is 25.76 kg/m.  Physical Exam General: no acute distress Cardiac: hemodynamically stable, nontachycardic Pulm: normal work of breathing Neuro: alert, no focal deficit Extremities: no edema, cyanosis or wounds   Data: CTA independently reviewed Right carotid bulb and ICA with severe greater than 90% stenosis with significant calcific disease. Left common carotid and ICA with calcific disease causing approximate 50% stenosis.     Assessment/Plan:     DYLLON HENKEN is a 78 y.o. male with PAD and asymptomatic carotid artery disease.  He has severe greater than 90% stenosis by CTA on the right.  We discussed  the natural history of carotid artery disease as well as the indications for treatment in asymptomatic patient.  I explained the 3 modalities of treatment with best medical therapy, carotid endarterectomy and carotid stenting.  I offered carotid endarterectomy given his severe stenosis, anatomy and burden of calcific disease I explained that stents are contraindicated with a significant level of calcific plaque.  We discussed the risks and benefits of carotid endarterectomy.  I informed that there is a small risk of stroke during the procedure which is about 1 to 2% but with medical therapy alone there is a 12% risk of stroke over the next 5 years.  I explained that with intervention, that risk is cut in half.  We also discussed the risk of cranial nerve injury, bleeding, infection and the small risk of restenosis in the future. The patient expressed understanding but is not interested in proceeding at this time.  He would like to think about this for about a month.  I explained we will follow-up in 1 month and at that time obtain an ABI and left lower extremity duplex to reassess his left  femoropopliteal bypass    Recommendations to optimize cardiovascular risk: Abstinence from all tobacco products. Blood glucose control with goal A1c < 7%. Blood pressure control with goal blood pressure < 140/90 mmHg. Lipid reduction therapy with goal LDL-C <100 mg/dL  Aspirin  81mg  PO QD.  Atorvastatin  40-80mg  PO QD (or other high intensity statin therapy).     Norman GORMAN Serve MD [facility]

## 2023-04-22 ENCOUNTER — Ambulatory Visit (INDEPENDENT_AMBULATORY_CARE_PROVIDER_SITE_OTHER): Payer: Medicare Other | Admitting: Vascular Surgery

## 2023-04-22 ENCOUNTER — Encounter: Payer: Self-pay | Admitting: Vascular Surgery

## 2023-04-22 VITALS — BP 166/78 | HR 65 | Temp 98.3°F | Resp 20 | Ht 71.0 in | Wt 184.7 lb

## 2023-04-22 DIAGNOSIS — I6523 Occlusion and stenosis of bilateral carotid arteries: Secondary | ICD-10-CM | POA: Diagnosis not present

## 2023-04-22 DIAGNOSIS — D128 Benign neoplasm of rectum: Secondary | ICD-10-CM | POA: Diagnosis not present

## 2023-04-26 ENCOUNTER — Telehealth: Payer: Self-pay

## 2023-04-26 MED ORDER — HYDROCHLOROTHIAZIDE 25 MG PO TABS: 12.5000 mg | ORAL_TABLET | Freq: Every day | ORAL | 1 refills | Status: DC

## 2023-04-26 NOTE — Telephone Encounter (Signed)
 Rx sent as requested.

## 2023-05-03 ENCOUNTER — Other Ambulatory Visit: Payer: Self-pay

## 2023-05-03 DIAGNOSIS — I739 Peripheral vascular disease, unspecified: Secondary | ICD-10-CM

## 2023-05-18 ENCOUNTER — Other Ambulatory Visit: Payer: Self-pay

## 2023-05-18 ENCOUNTER — Telehealth: Payer: Self-pay | Admitting: Emergency Medicine

## 2023-05-18 ENCOUNTER — Encounter: Payer: Self-pay | Admitting: Adult Health

## 2023-05-18 ENCOUNTER — Ambulatory Visit (INDEPENDENT_AMBULATORY_CARE_PROVIDER_SITE_OTHER): Payer: Medicare Other | Admitting: Adult Health

## 2023-05-18 VITALS — BP 128/88 | HR 92 | Temp 97.7°F | Resp 18 | Ht 71.0 in | Wt 184.6 lb

## 2023-05-18 DIAGNOSIS — Z5986 Financial insecurity: Secondary | ICD-10-CM | POA: Diagnosis not present

## 2023-05-18 DIAGNOSIS — D500 Iron deficiency anemia secondary to blood loss (chronic): Secondary | ICD-10-CM | POA: Diagnosis not present

## 2023-05-18 DIAGNOSIS — E1169 Type 2 diabetes mellitus with other specified complication: Secondary | ICD-10-CM | POA: Diagnosis not present

## 2023-05-18 DIAGNOSIS — I739 Peripheral vascular disease, unspecified: Secondary | ICD-10-CM | POA: Diagnosis not present

## 2023-05-18 DIAGNOSIS — I10 Essential (primary) hypertension: Secondary | ICD-10-CM

## 2023-05-18 DIAGNOSIS — I2510 Atherosclerotic heart disease of native coronary artery without angina pectoris: Secondary | ICD-10-CM

## 2023-05-18 DIAGNOSIS — R42 Dizziness and giddiness: Secondary | ICD-10-CM | POA: Diagnosis not present

## 2023-05-18 DIAGNOSIS — J45909 Unspecified asthma, uncomplicated: Secondary | ICD-10-CM

## 2023-05-18 DIAGNOSIS — Z8719 Personal history of other diseases of the digestive system: Secondary | ICD-10-CM

## 2023-05-18 LAB — GLUCOSE, POCT (MANUAL RESULT ENTRY): POC Glucose: 222 mg/dL — AB (ref 70–99)

## 2023-05-18 MED ORDER — VITAMIN C 1000 MG PO TABS
1000.0000 mg | ORAL_TABLET | Freq: Every day | ORAL | Status: AC
Start: 2023-05-18 — End: ?

## 2023-05-18 MED ORDER — CLOPIDOGREL BISULFATE 75 MG PO TABS: 75.0000 mg | ORAL_TABLET | Freq: Every day | ORAL | 1 refills | Status: DC

## 2023-05-18 MED ORDER — TRESIBA FLEXTOUCH 100 UNIT/ML ~~LOC~~ SOPN: 38.0000 [IU] | PEN_INJECTOR | Freq: Every day | SUBCUTANEOUS | 0 refills | Status: DC

## 2023-05-18 NOTE — Telephone Encounter (Signed)
 Incoming fax received form [facility] requesting a refill request for tresiba  inject 40-50 units, however this differs from medication list.  Outgoing call placed to patient and he states that he is taking medication as requested from the pharmacy, 40-50 units.   Please advise (update rx and send to pharmacy if necessary)

## 2023-05-18 NOTE — Progress Notes (Signed)
 [facility]  Provider:  Jereld Serum DNP  Code Status:  Full Code  Goals of Care:     05/18/2023   10:01 AM  Advanced Directives  Does Patient Have a Medical Advance Directive? No  Would patient like information on creating a medical advance directive? No - Patient declined     Chief Complaint  Patient presents with   Acute Visit    dizzy spells all week   Discussed the use of AI scribe software for clinical note transcription with the patient, who gave verbal consent to proceed.  HPI: Patient is a 78 y.o. male seen today for an acute visit for dizzy spells. He was accompanied by his friend who is his neighbor and Randy Jacobson, Randy Jacobson.  He has experienced persistent dizziness for about a year, which has progressively worsened. The dizziness primarily occurs when walking, leading to falls and several fainting spells in recent months. Previous medical evaluations have not resolved the issue.  He has a history of anemia with a blood count consistently around 8 to 8.5. Despite taking ferrous sulfate  325 mg, there has been no significant improvement in his symptoms. A past colonoscopy identified and addressed a source of bleeding. His blood count was last noted to be 8.4 two months ago.  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 Surgical options, including carotid endarterectomy or stenting, have been considered, but no decision has been made. His previous doctor did not attribute his dizziness to this blockage.  He has type 2 diabetes, managed with metformin  1000 mg twice daily and Tresiba  38 units daily. He monitors his blood sugar at home, with recent readings around 92, and an A1c of 6.2, indicating well-controlled diabetes. He uses a [employer] system for monitoring but has experienced issues with the device and supply logistics.  He has hypertension, managed with losartan  50 mg in the morning and at bedtime and  hydrochlorothiazide  25 mg daily. He also has a history of gastrointestinal bleeding and is on Protonix . He takes Plavix  and aspirin  for peripheral artery disease.  He has asthma, using albuterol  as needed. He has concerns about the cost of Symbicort , which he does not take regularly due to expense.  He experiences foot pain, previously evaluated by a podiatrist who trimmed his toenails but did not provide further treatment. He has been using an essential oil recommended by a visiting nurse, which has helped alleviate his foot pain.  He is concerned about his dizziness affecting his ability to drive and perform daily activities. He has a girlfriend, Randy Jacobson, and has a friend named Randy Jacobson who assists him with medical appointments.  No recent changes in blood sugar control, with recent readings around 92. No other significant symptoms reported. He uses Libre2 to monitor his blood sugar.      Past Medical History:  Diagnosis Date   Diabetes mellitus without complication (HCC)    Dysrhythmia    chronic RBBB; bradycardia s/p atropine  following sheath removal, 3.8 second sinus pause 10/11/22   Hypertension    Peripheral artery disease [facility])     Past Surgical History:  Procedure Laterality Date   stents in both my legs     ABDOMINAL AORTOGRAM W/LOWER EXTREMITY N/A 10/11/2022   Procedure: ABDOMINAL AORTOGRAM W/LOWER EXTREMITY;  Surgeon: Randy Lonni RAMAN, MD;  Location: [facility] INVASIVE CV LAB;  Service: Cardiovascular;  Laterality: N/A;   COLONOSCOPY WITH PROPOFOL  N/A 01/18/2023   Procedure: COLONOSCOPY WITH PROPOFOL ;  Surgeon:  Randy Jasper, MD;  Location: [facility] ENDOSCOPY;  Service: Gastroenterology;  Laterality: N/A;   ENDARTERECTOMY FEMORAL Left 10/15/2022   Procedure: ENDARTERECTOMY OF LEFT ILIOFEMORAL ARTERY;  Surgeon: Randy Lonni RAMAN, MD;  Location: [facility] OR;  Service: Vascular;  Laterality: Left;   ESOPHAGOGASTRODUODENOSCOPY (EGD) WITH PROPOFOL  N/A 01/18/2023   Procedure:  ESOPHAGOGASTRODUODENOSCOPY (EGD) WITH PROPOFOL ;  Surgeon: Randy Jasper, MD;  Location: [facility] ENDOSCOPY;  Service: Gastroenterology;  Laterality: N/A;   FEMORAL-POPLITEAL BYPASS GRAFT Left 10/15/2022   Procedure: LEFT FEMORAL-BELOW KNEE POPLITEAL ARTERY BYPASS WITH VEIN HARVEST OF LEFT GREATER SAPHENOUS VEIN;  Surgeon: Randy Lonni RAMAN, MD;  Location: [facility] OR;  Service: Vascular;  Laterality: Left;   GIVENS CAPSULE STUDY N/A 04/10/2017   Procedure: GIVENS CAPSULE STUDY;  Surgeon: Randy Specking, MD;  Location: [facility] ENDOSCOPY;  Service: Endoscopy;  Laterality: N/A;   GIVENS CAPSULE STUDY N/A 01/18/2023   Procedure: GIVENS CAPSULE STUDY;  Surgeon: Randy Jasper, MD;  Location: [facility] ENDOSCOPY;  Service: Gastroenterology;  Laterality: N/A;   IR GENERIC HISTORICAL  08/26/2015   IR RADIOLOGIST EVAL & MGMT 08/26/2015 Randy Lent, MD [facility] INTERV RAD   IR GENERIC HISTORICAL  04/21/2016   IR RADIOLOGIST EVAL & MGMT 04/21/2016 Randy Lent, MD [facility] INTERV RAD   [facility] ANGIOPLASTY Left 10/15/2022   Procedure: PATCH ANGIOPLASTY USING XENOSURE BIOLOGIC PATCH 1CMX14CM;  Surgeon: Randy Lonni RAMAN, MD;  Location: [facility] OR;  Service: Vascular;  Laterality: Left;   PERIPHERAL VASCULAR INTERVENTION  10/11/2022   Procedure: PERIPHERAL VASCULAR INTERVENTION;  Surgeon: Randy Lonni RAMAN, MD;  Location: [facility] INVASIVE CV LAB;  Service: Cardiovascular;;    No Known Allergies  Outpatient Encounter Medications as of 05/18/2023  Medication Sig   albuterol  (VENTOLIN  HFA) 108 (90 Base) MCG/ACT inhaler Inhale 2 puffs into the lungs as needed for wheezing or shortness of breath.   aspirin  EC 81 MG tablet Take 1 tablet (81 mg total) by mouth daily at 6 (six) AM. Swallow whole.   atorvastatin  (LIPITOR) 40 MG tablet Take 40 mg by mouth daily.   clopidogrel  (PLAVIX ) 75 MG tablet Take 75 mg by mouth daily.   Continuous Glucose Receiver (FREESTYLE LIBRE 2 READER) DEVI 1 Device by Does not apply route 3 (three) times daily.  E11.69   Continuous Glucose Sensor (FREESTYLE LIBRE 2 SENSOR) MISC 2 Devices by Does not apply route daily. E11.69   Emollient (EUCERIN) lotion Apply 1 Application topically as needed for dry skin.   ferrous sulfate  325 (65 FE) MG tablet Take 1 tablet (325 mg total) by mouth daily with breakfast.   hydrochlorothiazide  (HYDRODIURIL ) 25 MG tablet Take 0.5 tablets (12.5 mg total) by mouth daily.   losartan  (COZAAR ) 50 MG tablet Take 50 mg by mouth in the morning and at bedtime.   Melatonin 10 MG TABS Take 10 mg by mouth at bedtime.   metFORMIN  (GLUCOPHAGE ) 1000 MG tablet Take 1 tablet (1,000 mg total) by mouth 2 (two) times daily with a meal.   pantoprazole  (PROTONIX ) 40 MG tablet Take 40 mg by mouth daily.   TRESIBA  FLEXTOUCH 100 UNIT/ML FlexTouch Pen Inject 38 Units into the skin daily.   budesonide -formoterol  (SYMBICORT ) 160-4.5 MCG/ACT inhaler Inhale 2 puffs into the lungs 2 (two) times daily. Rinse mouth with water after each use (Patient not taking: Reported on 03/22/2023)   mineral oil-hydrophilic petrolatum (AQUAPHOR) ointment Apply 1 Application topically at bedtime. (Patient not taking: Reported on 03/22/2023)   Multiple Vitamin (MULTIVITAMIN WITH MINERALS) TABS tablet Take 1 tablet by mouth daily. (Patient not taking:  Reported on 03/22/2023)   No facility-administered encounter medications on file as of 05/18/2023.    Review of Systems:  Review of Systems  Constitutional:  Negative for activity change, appetite change and fever.  HENT:  Negative for sore throat.   Eyes: Negative.   Cardiovascular:  Negative for chest pain and leg swelling.  Gastrointestinal:  Negative for abdominal distention, diarrhea and vomiting.  Genitourinary:  Negative for dysuria, frequency and urgency.  Skin:  Negative for color change.  Neurological:  Positive for dizziness. Negative for headaches.  Psychiatric/Behavioral:  Negative for behavioral problems and sleep disturbance. The patient is not  nervous/anxious.     Health Maintenance  Topic Date Due   OPHTHALMOLOGY EXAM  Never done   Pneumonia Vaccine 97+ Years old (2 of 2 - PCV) 06/11/2007   DTaP/Tdap/Td (2 - Tdap) 03/12/2013   Medicare Annual Wellness (AWV)  02/09/2023   COVID-19 Vaccine (4 - 2024-25 season) 04/12/2024 (Originally 02/22/2023)   HEMOGLOBIN A1C  09/16/2023   Diabetic kidney evaluation - eGFR measurement  01/17/2024   FOOT EXAM  03/16/2024   Diabetic kidney evaluation - Urine ACR  03/17/2024   INFLUENZA VACCINE  Completed   Hepatitis C Screening  Completed   Zoster Vaccines- Shingrix  Completed   HPV VACCINES  Aged Out   Colonoscopy  Discontinued    Physical Exam: Vitals:   05/18/23 1002  BP: 128/88  Pulse: 92  Resp: 18  Temp: 97.7 F (36.5 C)  SpO2: 98%  Weight: 184 lb 9.6 oz (83.7 kg)  Height: 5' 11 (1.803 m)   Body mass index is 25.75 kg/m. Physical Exam Constitutional:      Appearance: Normal appearance.  HENT:     Head: Normocephalic and atraumatic.     Mouth/Throat:     Mouth: Mucous membranes are moist.  Eyes:     Conjunctiva/sclera: Conjunctivae normal.  Cardiovascular:     Rate and Rhythm: Normal rate and regular rhythm.     Pulses: Normal pulses.     Heart sounds: Normal heart sounds.  Pulmonary:     Effort: Pulmonary effort is normal.     Breath sounds: Normal breath sounds.  Abdominal:     General: Bowel sounds are normal.     Palpations: Abdomen is soft.  Musculoskeletal:        General: No swelling. Normal range of motion.     Cervical back: Normal range of motion.  Skin:    General: Skin is warm and dry.  Neurological:     General: No focal deficit present.     Mental Status: He is alert and oriented to person, place, and time.  Psychiatric:        Mood and Affect: Mood normal.        Behavior: Behavior normal.        Thought Content: Thought content normal.        Judgment: Judgment normal.     Labs reviewed: Basic Metabolic Panel: Recent Labs     10/11/22 1625 10/11/22 1626 10/15/22 0634 10/17/22 0117 01/16/23 1325 01/17/23 0813 04/19/23 1321 04/19/23 1420  NA  --  137   < > 135 138 140  --   --   K  --  4.1   < > 3.8 3.8 3.7  --   --   CL  --  106   < > 100 100 106  --   --   CO2  --  25   < > 25  26 25  --   --   GLUCOSE  --  265*   < > 163* 103* 99  --   --   BUN  --  16   < > 22 17 10   --   --   CREATININE  --  0.80   < > 1.07 1.03 0.81 1.00 1.00  CALCIUM   --  8.3*   < > 8.3* 8.5* 8.2*  --   --   MG  --  1.8  --   --   --   --   --   --   TSH 0.362  --   --   --   --   --   --   --    < > = values in this interval not displayed.   Liver Function Tests: Recent Labs    10/15/22 0634 01/16/23 1325  AST 28 21  ALT 24 16  ALKPHOS 76 60  BILITOT 0.7 0.5  PROT 7.8 6.8  ALBUMIN  4.2 3.7   No results for input(s): LIPASE, AMYLASE in the last 8760 hours. No results for input(s): AMMONIA in the last 8760 hours. CBC: Recent Labs    10/16/22 1615 10/17/22 0117 01/16/23 1325 01/17/23 0813 01/18/23 0447 01/19/23 0443 03/18/23 0841  WBC 15.7*   < > 10.1   < > 6.4 7.9 6.5  NEUTROABS 10.8*  --  7.8*  --   --   --  4,095  HGB 8.5*   < > 7.9*   < > 7.4* 7.9* 8.4*  HCT 24.9*   < > 26.4*   < > 24.1* 25.5* 26.6*  MCV 88.3   < > 84.3   < > 84.3 83.6 83.4  PLT 185   < > 262   < > 252 264 327   < > = values in this interval not displayed.   Lipid Panel: Recent Labs    10/12/22 0107 10/16/22 0306 03/18/23 0841  CHOL 109 69 124  HDL 36* 25* 44  LDLCALC 49 25 60  TRIG 121 93 114  CHOLHDL 3.0 2.8 2.8   Lab Results  Component Value Date   HGBA1C 6.2 (H) 03/18/2023    Procedures since last visit: CT ANGIO NECK W OR WO CONTRAST Result Date: 04/28/2023 CLINICAL DATA:  Stenosis of right carotid artery. Left carotid bruit. EXAM: CT ANGIOGRAPHY NECK TECHNIQUE: Multidetector CT imaging of the neck was performed using the standard protocol during bolus administration of intravenous contrast. Multiplanar CT image  reconstructions and MIPs were obtained to evaluate the vascular anatomy. Carotid stenosis measurements (when applicable) are obtained utilizing NASCET criteria, using the distal internal carotid diameter as the denominator. RADIATION DOSE REDUCTION: This exam was performed according to the departmental dose-optimization program which includes automated exposure control, adjustment of the mA and/or kV according to patient size and/or use of iterative reconstruction technique. CONTRAST:  50mL OMNIPAQUE  IOHEXOL  350 MG/ML SOLN COMPARISON:  None Available. FINDINGS: Aortic arch: Aortic atherosclerosis. Branching pattern is normal without origin stenosis. 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 Right carotid system: Scattered plaque affects the common carotid artery, maximal stenosis 40% in the midportion. There is advanced calcified plaque at the distal common carotid artery common carotid bifurcation and ICA bulb with serial stenoses. These are difficult to measure precisely because of the irregular nature of the plaque, but the initial stenosis at the distal common carotid artery is probably 80% or greater in the stenosis at the  proximal ICA may also approach 80%. Beyond that, the ICA is tortuous but patent to the skull base. Left carotid system: Common carotid artery shows scattered plaque with maximal stenosis of 30%. Complex calcified plaque at the distal common carotid artery, carotid bifurcation and ICA bulb. Maximal stenosis at the bifurcation is estimated at 30-40 %. Again this is difficult to measure precisely because of irregular plaque. Beyond that, the ICA is patent to the skull base. Vertebral arteries: Calcified plaque affects the right subclavian artery in the region of the right vertebral artery origin. Maximal subclavian stenosis estimated at 50%. The vertebral artery appears widely patent at its origin and then widely patent through the cervical region to the foramen magnum.  There is calcified plaque at the foramen magnum with stenosis of the vertebral artery estimated at 30%. On the left, as noted above, there is extensive atherosclerotic disease of the subclavian artery with stenosis of 40% just beyond the origin. Extensive calcified plaque in the region of the left vertebral artery origin, with stenosis estimated at 70%. The vessel does remain patent and shows flow through the cervical region, through the foramen magnum to the basilar artery. Mild atherosclerotic disease in the vertebral artery V4 segment with stenosis estimated at 30%. Skeleton: Ordinary mid cervical spondylosis. Other neck: No mass or lymphadenopathy. Upper chest: Lung apices are clear. Moderate emphysematous changes present. IMPRESSION: 1. Aortic atherosclerosis. 2. 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 3. 50% stenosis of the right subclavian artery just beyond the origin. 4. Advanced calcified plaque at the distal right common carotid artery, common carotid bifurcation and ICA bulb. Serial stenoses. These are difficult to measure precisely because of the irregular nature of the plaque, but the initial stenosis at the distal common carotid artery is probably 80% or greater and the stenosis at the proximal ICA may also approach 80%. 5. Complex calcified plaque at the distal left common carotid artery, carotid bifurcation and ICA bulb. Maximal stenosis at the bifurcation is estimated at 30-40 %. Again this is difficult to measure precisely because of irregular plaque. 6. Extensive calcified plaque in the region of the left vertebral artery origin, with stenosis estimated at 70%. Aortic Atherosclerosis (ICD10-I70.0) and Emphysema (ICD10-J43.9). Electronically Signed   By: Oneil Officer M.D.   On: 04/28/2023 13:06    Assessment/Plan  1. Dizziness (Primary) -  carotid artery 90% blockage on the right side, likely contributing to dizziness -  hgb 8.4 -  anemia and right carotid artery  90% blockage possibly causing -  will re-check CBC and iron  studies if iron   infusion is needed  2. Coronary artery disease involving native coronary artery of native heart without angina pectoris -  90% blockage on the right side, likely contributing to dizziness. Discussed the risks and benefits of carotid endarterectomy vs. conservative management. -Continue current management with Plavix  and Aspirin . -Consider carotid endarterectomy if dizziness persists or worsens.  3. Iron  deficiency anemia due to chronic blood loss Lab Results  Component Value Date   HGB 8.4 (L) 03/18/2023    Persistent despite oral iron  supplementation. Discussed the role of vitamin C  in iron  absorption and the potential need for iron  infusion if levels remain low. -Check CBC and iron  levels today. -Add Vitamin C  1000mg  daily to aid iron  absorption. -Consider referral for iron  infusion if levels remain low. - Ascorbic Acid (VITAMIN C ) 1000 MG tablet; Take 1 tablet (1,000 mg total) by mouth daily. - CBC with Differential/Platelets - Iron , TIBC  and Ferritin Panel  4. History of GI bleed -  denies bloody stools -  continue Pantoprazole   5. Type 2 diabetes mellitus with other specified complication, without long-term current use of insulin  (HCC) Lab Results  Component Value Date   HGBA1C 6.2 (H) 03/18/2023    - POC Glucose (CBG) 222 - Well-controlled with Metformin  1000mg  BID and Tresiba  38 units daily. Recent A1C was 6.2. Blood glucose was 222 during the visit. -Continue current management. -Encourage regular blood glucose monitoring and logging. -Advise on dietary modifications, including increased vegetable intake and reduced carbohydrate intake.  6. Primary hypertension -  BP 128/88, stable -  continue Losartan  and HCTZ - Complete Metabolic Panel with eGFR  7. PAD (peripheral artery disease) (HCC) -  Managed with Plavix  and Aspirin . -Continue current management.  8. Moderate asthma, unspecified  whether complicated, unspecified whether persistent - Not using Symbicort  due to cost. Albuterol  used as needed. -Consider alternative long-acting inhaler if Symbicort  is not covered by insuranc - AMB Referral VBCI Care Management  9. Patient cannot afford medications -  cannot afford copay for Symbicort  - AMB Referral VBCI Care Management    Follow-up in 1 month, but will contact patient sooner with results of blood work.    Labs/tests ordered:   CBC, CMP, iron  studies, POC glucose  Next appt:  Visit date not found

## 2023-05-19 ENCOUNTER — Telehealth: Payer: Self-pay

## 2023-05-19 ENCOUNTER — Other Ambulatory Visit: Payer: Self-pay | Admitting: Adult Health

## 2023-05-19 DIAGNOSIS — D500 Iron deficiency anemia secondary to blood loss (chronic): Secondary | ICD-10-CM

## 2023-05-19 LAB — CBC WITH DIFFERENTIAL/PLATELET
Absolute Lymphocytes: 837 {cells}/uL — ABNORMAL LOW (ref 850–3900)
Absolute Monocytes: 1296 {cells}/uL — ABNORMAL HIGH (ref 200–950)
Basophils Absolute: 54 {cells}/uL (ref 0–200)
Basophils Relative: 0.6 %
Eosinophils Absolute: 180 {cells}/uL (ref 15–500)
Eosinophils Relative: 2 %
HCT: 24.6 % — ABNORMAL LOW (ref 38.5–50.0)
Hemoglobin: 7.2 g/dL — ABNORMAL LOW (ref 13.2–17.1)
MCH: 22.7 pg — ABNORMAL LOW (ref 27.0–33.0)
MCHC: 29.3 g/dL — ABNORMAL LOW (ref 32.0–36.0)
MCV: 77.6 fL — ABNORMAL LOW (ref 80.0–100.0)
MPV: 11.1 fL (ref 7.5–12.5)
Monocytes Relative: 14.4 %
Neutro Abs: 6633 {cells}/uL (ref 1500–7800)
Neutrophils Relative %: 73.7 %
Platelets: 367 10*3/uL (ref 140–400)
RBC: 3.17 10*6/uL — ABNORMAL LOW (ref 4.20–5.80)
RDW: 16.5 % — ABNORMAL HIGH (ref 11.0–15.0)
Total Lymphocyte: 9.3 %
WBC: 9 10*3/uL (ref 3.8–10.8)

## 2023-05-19 LAB — IRON,TIBC AND FERRITIN PANEL
%SAT: 25 % (ref 20–48)
Ferritin: 12 ng/mL — ABNORMAL LOW (ref 24–380)
Iron: 101 ug/dL (ref 50–180)
TIBC: 401 ug/dL (ref 250–425)

## 2023-05-19 LAB — COMPLETE METABOLIC PANEL WITH GFR
AG Ratio: 1.6 (calc) (ref 1.0–2.5)
ALT: 15 U/L (ref 9–46)
AST: 18 U/L (ref 10–35)
Albumin: 4.5 g/dL (ref 3.6–5.1)
Alkaline phosphatase (APISO): 87 U/L (ref 35–144)
BUN: 16 mg/dL (ref 7–25)
CO2: 26 mmol/L (ref 20–32)
Calcium: 9.1 mg/dL (ref 8.6–10.3)
Chloride: 100 mmol/L (ref 98–110)
Creat: 1.04 mg/dL (ref 0.70–1.28)
Globulin: 2.9 g/dL (ref 1.9–3.7)
Glucose, Bld: 196 mg/dL — ABNORMAL HIGH (ref 65–139)
Potassium: 4.2 mmol/L (ref 3.5–5.3)
Sodium: 136 mmol/L (ref 135–146)
Total Bilirubin: 0.5 mg/dL (ref 0.2–1.2)
Total Protein: 7.4 g/dL (ref 6.1–8.1)
eGFR: 73 mL/min/{1.73_m2} (ref >=60)

## 2023-05-19 NOTE — Progress Notes (Signed)
-  Iron  levels are now within normal, continue taking current Iron  dosage -  hgb low, I have ordered urgent referral to hematology -  electrolytes and liver enzymes normal

## 2023-05-19 NOTE — Progress Notes (Signed)
 Care Guide Pharmacy Note  05/19/2023 Name: Randy Jacobson MRN: [id number] DOB: 09-07-1945  Referred By: Medina-Vargas, Monina C, NP Reason for referral: Care Coordination (Outreach to schedule with Pharm d )   GRIFF BADLEY is a 78 y.o. year old male who is a primary care patient of Medina-Vargas, Monina C, NP.  [name] was referred to the pharmacist for assistance related to:  asthma  An unsuccessful telephone outreach was attempted today to contact the patient who was referred to the pharmacy team for assistance with medication assistance. Additional attempts will be made to contact the patient.  Jeoffrey Buffalo , RMA     [facility]  [facility], [facility] Guide  Direct Dial: [phone]  Website: [email or web address]

## 2023-05-20 ENCOUNTER — Telehealth: Payer: Self-pay

## 2023-05-20 ENCOUNTER — Telehealth: Payer: Self-pay | Admitting: Nurse Practitioner

## 2023-05-20 NOTE — Telephone Encounter (Signed)
 I spoke with Monina Medina-Vargas NP regarding the referral for iron  deficiency anemia.  We will see him next week.  She reports his hemoglobin is low but stable.  A source of blood loss has not been identified.  We discussed his current symptoms and my concern that he is symptomatic from the anemia.  She does not feel he needs a blood transfusion at present.  We will contact Mr. Carcione with an appointment for next week.

## 2023-05-20 NOTE — Progress Notes (Signed)
 Care Guide Pharmacy Note  05/20/2023 Name: Randy Jacobson MRN: [id number] DOB: 1945-12-29  Referred By: Medina-Vargas, Monina C, NP Reason for referral: Care Coordination (Outreach to schedule with Pharm d )   Randy Jacobson is a 78 y.o. year old male who is a primary care patient of Medina-Vargas, Monina C, NP.  Randy Jacobson was referred to the pharmacist for assistance related to:  asthma  Successful contact was made with the patient to discuss pharmacy services including being ready for the pharmacist to call at least 5 minutes before the scheduled appointment time and to have medication bottles and any blood pressure readings ready for review. The patient agreed to meet with the pharmacist via telephone visit on (date/time).06/02/2023  Jeoffrey Buffalo , RMA     [place]  [facility] Guide  Direct Dial: [phone]  Website: [email or web address]

## 2023-05-20 NOTE — Telephone Encounter (Signed)
 Medina-Vargas, Monina C, NP asked me to the patient to see if he would like to do a referral for CT Scan of the abdomen and pelvis to look for other sources of anemia besides the stomach, small bowel and colon. Patient has agreed to do the CT Scan.   Message sent to Medina-Vargas, Jereld BROCKS, NP and Tobias Ally the referral coordinator

## 2023-05-23 ENCOUNTER — Inpatient Hospital Stay: Payer: Medicare Other | Attending: Oncology | Admitting: Oncology

## 2023-05-23 ENCOUNTER — Other Ambulatory Visit: Payer: Self-pay | Admitting: Nurse Practitioner

## 2023-05-23 ENCOUNTER — Inpatient Hospital Stay: Payer: Medicare Other

## 2023-05-23 VITALS — BP 140/56 | HR 92 | Temp 98.1°F | Resp 18 | Ht 71.0 in | Wt 183.5 lb

## 2023-05-23 DIAGNOSIS — I10 Essential (primary) hypertension: Secondary | ICD-10-CM | POA: Diagnosis not present

## 2023-05-23 DIAGNOSIS — D509 Iron deficiency anemia, unspecified: Secondary | ICD-10-CM | POA: Insufficient documentation

## 2023-05-23 DIAGNOSIS — Z7902 Long term (current) use of antithrombotics/antiplatelets: Secondary | ICD-10-CM | POA: Insufficient documentation

## 2023-05-23 DIAGNOSIS — Z7982 Long term (current) use of aspirin: Secondary | ICD-10-CM | POA: Diagnosis not present

## 2023-05-23 DIAGNOSIS — E119 Type 2 diabetes mellitus without complications: Secondary | ICD-10-CM | POA: Diagnosis not present

## 2023-05-23 DIAGNOSIS — D649 Anemia, unspecified: Secondary | ICD-10-CM

## 2023-05-23 DIAGNOSIS — Z87891 Personal history of nicotine dependence: Secondary | ICD-10-CM | POA: Diagnosis not present

## 2023-05-23 LAB — CBC WITH DIFFERENTIAL (CANCER CENTER ONLY)
Abs Immature Granulocytes: 0.13 10*3/uL — ABNORMAL HIGH (ref 0.00–0.07)
Basophils Absolute: 0.1 10*3/uL (ref 0.0–0.1)
Basophils Relative: 1 %
Eosinophils Absolute: 0.3 10*3/uL (ref 0.0–0.5)
Eosinophils Relative: 3 %
HCT: 26.9 % — ABNORMAL LOW (ref 39.0–52.0)
Hemoglobin: 7.7 g/dL — ABNORMAL LOW (ref 13.0–17.0)
Immature Granulocytes: 2 %
Lymphocytes Relative: 16 %
Lymphs Abs: 1.4 10*3/uL (ref 0.7–4.0)
MCH: 23.6 pg — ABNORMAL LOW (ref 26.0–34.0)
MCHC: 28.6 g/dL — ABNORMAL LOW (ref 30.0–36.0)
MCV: 82.5 fL (ref 80.0–100.0)
Monocytes Absolute: 1.1 10*3/uL — ABNORMAL HIGH (ref 0.1–1.0)
Monocytes Relative: 13 %
Neutro Abs: 5.8 10*3/uL (ref 1.7–7.7)
Neutrophils Relative %: 65 %
Platelet Count: 344 10*3/uL (ref 150–400)
RBC: 3.26 MIL/uL — ABNORMAL LOW (ref 4.22–5.81)
RDW: 17.8 % — ABNORMAL HIGH (ref 11.5–15.5)
WBC Count: 8.8 10*3/uL (ref 4.0–10.5)
nRBC: 0 % (ref 0.0–0.2)

## 2023-05-23 LAB — CMP (CANCER CENTER ONLY)
ALT: 13 U/L (ref 0–44)
AST: 17 U/L (ref 15–41)
Albumin: 4.4 g/dL (ref 3.5–5.0)
Alkaline Phosphatase: 78 U/L (ref 38–126)
Anion gap: 12 (ref 5–15)
BUN: 16 mg/dL (ref 8–23)
CO2: 25 mmol/L (ref 22–32)
Calcium: 9 mg/dL (ref 8.9–10.3)
Chloride: 101 mmol/L (ref 98–111)
Creatinine: 0.98 mg/dL (ref 0.61–1.24)
GFR, Estimated: >60 mL/min (ref >60)
Glucose, Bld: 127 mg/dL — ABNORMAL HIGH (ref 70–99)
Potassium: 4.3 mmol/L (ref 3.5–5.1)
Sodium: 138 mmol/L (ref 135–145)
Total Bilirubin: 0.5 mg/dL (ref 0.0–1.2)
Total Protein: 7.7 g/dL (ref 6.5–8.1)

## 2023-05-23 LAB — URINALYSIS, COMPLETE (UACMP) WITH MICROSCOPIC
Bacteria, UA: NONE SEEN
Bilirubin Urine: NEGATIVE
Glucose, UA: NEGATIVE mg/dL
Hgb urine dipstick: NEGATIVE
Ketones, ur: NEGATIVE mg/dL
Leukocytes,Ua: NEGATIVE
Nitrite: NEGATIVE
Specific Gravity, Urine: 1.026 (ref 1.005–1.030)
pH: 5.5 (ref 5.0–8.0)

## 2023-05-23 LAB — IRON AND TIBC
Iron: 315 ug/dL — ABNORMAL HIGH (ref 45–182)
Saturation Ratios: 67 % — ABNORMAL HIGH (ref 17.9–39.5)
TIBC: 472 ug/dL — ABNORMAL HIGH (ref 250–450)
UIBC: 157 ug/dL

## 2023-05-23 LAB — ABO/RH: ABO/RH(D): O NEG

## 2023-05-23 LAB — FERRITIN: Ferritin: 10 ng/mL — ABNORMAL LOW (ref 24–336)

## 2023-05-23 LAB — SAMPLE TO BLOOD BANK

## 2023-05-23 LAB — SAVE SMEAR(SSMR), FOR PROVIDER SLIDE REVIEW

## 2023-05-23 NOTE — Progress Notes (Signed)
 [facility] New Patient Consult   Requesting MD: Medina-vargas, Sid Dragon, Np [address] [place],  [place] [address]   Randy Jacobson 78 y.o.  01-26-1946    Reason for Consult: Anemia   HPI: Randy Jacobson is referred for evaluation of anemia.  A CBC on 05/18/2023 found the hemoglobin at 7.2, MCV 77.6, MCH 22.7, RDW 16.5, platelet 367,000, WBC 9.0, and ANC 6.6.  Review of the medical record reveals a history of microcytic anemia dating to December 2018.  The ferritin has been low on multiple occasions in the past including a level of 12 on 05/18/2023.  The stool returned Hemoccult positive on 01/16/2023.  He underwent a colonoscopy 01/18/2023.  Stool was found in the colon.  A repeat colonoscopy will be scheduled for 6 months.  Upper endoscopy 01/18/2023 revealed a single nodule in the stomach.  A capsule endoscopy was performed 01/18/2023 this was a normal small bowel study.  Randy Jacobson denies bleeding.  He has black stool when taking iron .  This resolved when he discontinued iron .  He has a history of gastric ulcers and iron  deficiency anemia in 2018.  He received Feraheme  in December 2018 and February/March 2021.  He received Venofer  Tober 2024.   Past Medical History:  Diagnosis Date   Diabetes mellitus without complication (HCC)    Dysrhythmia    chronic RBBB; bradycardia s/p atropine  following sheath removal, 3.8 second sinus pause 10/11/22   Hypertension    Peripheral artery disease (HCC)     .  History of gastric ulcers   .  Iron  deficiency anemia 2018  Past Surgical History:  Procedure Laterality Date   "stents in both my legs"     ABDOMINAL AORTOGRAM W/LOWER EXTREMITY N/A 10/11/2022   Procedure: ABDOMINAL AORTOGRAM W/LOWER EXTREMITY;  Surgeon: Dannis Dy, MD;  Location: [facility] INVASIVE CV LAB;  Service: Cardiovascular;  Laterality: N/A;   COLONOSCOPY WITH PROPOFOL  N/A 01/18/2023   Procedure: COLONOSCOPY WITH PROPOFOL ;  Surgeon: Genell Ken, MD;  Location: [facility]  ENDOSCOPY;  Service: Gastroenterology;  Laterality: N/A;   ENDARTERECTOMY FEMORAL Left 10/15/2022   Procedure: ENDARTERECTOMY OF LEFT ILIOFEMORAL ARTERY;  Surgeon: Dannis Dy, MD;  Location: [facility] OR;  Service: Vascular;  Laterality: Left;   ESOPHAGOGASTRODUODENOSCOPY (EGD) WITH PROPOFOL  N/A 01/18/2023   Procedure: ESOPHAGOGASTRODUODENOSCOPY (EGD) WITH PROPOFOL ;  Surgeon: Genell Ken, MD;  Location: [facility] ENDOSCOPY;  Service: Gastroenterology;  Laterality: N/A;   FEMORAL-POPLITEAL BYPASS GRAFT Left 10/15/2022   Procedure: LEFT FEMORAL-BELOW KNEE POPLITEAL ARTERY BYPASS WITH VEIN HARVEST OF LEFT GREATER SAPHENOUS VEIN;  Surgeon: Dannis Dy, MD;  Location: [facility] OR;  Service: Vascular;  Laterality: Left;   GIVENS CAPSULE STUDY N/A 04/10/2017   Procedure: GIVENS CAPSULE STUDY;  Surgeon: Baldo Bonds, MD;  Location: [facility] ENDOSCOPY;  Service: Endoscopy;  Laterality: N/A;   GIVENS CAPSULE STUDY N/A 01/18/2023   Procedure: GIVENS CAPSULE STUDY;  Surgeon: Genell Ken, MD;  Location: [facility] ENDOSCOPY;  Service: Gastroenterology;  Laterality: N/A;   IR GENERIC HISTORICAL  08/26/2015   IR RADIOLOGIST EVAL & MGMT 08/26/2015 Fernando Hoyer, MD [facility] INTERV RAD   IR GENERIC HISTORICAL  04/21/2016   IR RADIOLOGIST EVAL & MGMT 04/21/2016 Fernando Hoyer, MD [facility] INTERV RAD   [name] ANGIOPLASTY Left 10/15/2022   Procedure: PATCH ANGIOPLASTY USING XENOSURE BIOLOGIC PATCH 1CMX14CM;  Surgeon: Dannis Dy, MD;  Location: [facility] OR;  Service: Vascular;  Laterality: Left;   PERIPHERAL VASCULAR INTERVENTION  10/11/2022   Procedure: PERIPHERAL VASCULAR INTERVENTION;  Surgeon: Dannis Dy, MD;  Location: [facility] INVASIVE CV LAB;  Service: Cardiovascular;;    Medications: Reviewed  Allergies: No Known Allergies  Family history: A sister died of "cancer ".  No history of a hematologic condition  Social History:   He lives alone in [place].  He is retired Administrator.  He quit smoking  cigarettes 30 years ago after smoking 1 pack/day for greater than 30 years.  He received a Red cell transfusion in 2018.  He reports drinking 2-3 beers once weekly.  He previously drank several beers daily and quit 5 years ago.  ROS:   Positives include: Intermittent exertional dyspnea and dizziness, black stool while taking iron , neuropathy in the feet, occasional cough productive of a yellow sputum when taking Mucinex   A complete ROS was otherwise negative.  Physical Exam:  Blood pressure (!) 140/56, pulse 92, temperature 98.1 F (36.7 C), resp. rate 18, height 5\' 11"  (1.803 m), weight 183 lb 8 oz (83.2 kg), SpO2 97%.  HEENT: Oral cavity without visible mass, neck without mass Lungs: Clear bilaterally Cardiac: Regular rate and rhythm Abdomen: No hepatosplenomegaly Vascular: No leg edema Lymph nodes: No cervical, supraclavicular, axillary, or inguinal nodes Neurologic: Alert and oriented, the motor exam appears intact in the upper and lower extremities bilaterally Skin: No rash Musculoskeletal: No spine tenderness   LAB:  CBC  Lab Results  Component Value Date   WBC 8.8 05/23/2023   HGB 7.7 (L) 05/23/2023   HCT 26.9 (L) 05/23/2023   MCV 82.5 05/23/2023   PLT 344 05/23/2023   NEUTROABS 5.8 05/23/2023    Blood smear: The platelets are normal in number, no platelet clumps.  Majority the white cells are mature neutrophils and mononuclear cells.  Few band forms.  No blasts or other young forms are seen.  There is marked variation in Red cell size.  There are ovalocytes, target cells, and a few teardrops.  The polychromasia is mildly increased.  No nucleated red cells.    CMP  Lab Results  Component Value Date   NA 138 05/23/2023   K 4.3 05/23/2023   CL 101 05/23/2023   CO2 25 05/23/2023   GLUCOSE 127 (H) 05/23/2023   BUN 16 05/23/2023   CREATININE 0.98 05/23/2023   CALCIUM  9.0 05/23/2023   PROT 7.7 05/23/2023   ALBUMIN  4.4 05/23/2023   AST 17 05/23/2023   ALT 13  05/23/2023   ALKPHOS 78 05/23/2023   BILITOT 0.5 05/23/2023   GFRNONAA >60 05/23/2023   GFRAA 94 05/21/2019        Assessment/Plan:   Anemia Chronic anemia with intermittent Red cell microcytosis Documented iron  deficiency on repeat ferritin testing including 05/23/2023 He has received Feraheme  and Venofer  in the past Negative GI workup for a source of blood loss including upper endoscopy, colonoscopy, and small bowel camera endoscopy 01/18/2023 History of iron  deficiency anemia in 2018 Gastric ulcers 2018 4.   Diabetes 5.   Hypertension 6.   Peripheral vascular disease-maintained on aspirin  and Plavix  7.   Carotid artery stenosis 8.   Peripheral neuropathy   Disposition:   Randy Philibert is referred for evaluation of anemia.  He has chronic anemia with documented iron  deficiency on multiple occasions.  No clear source for blood loss has not been identified on an extensive GI evaluation.  A urinalysis 05/23/2023 is negative for blood.  There is no clinical evidence of a malabsorption syndrome.  I suspect he has chronic subclinical blood loss from the GI tract.  The hematologic indices, ferritin, and peripheral blood smear are consistent with persistent iron  deficiency.  I discussed treatment options with RandyLobo.  He is symptomatic with exertional dyspnea and intermittent dizziness.  We discussed and occasion for Red cell transfusion therapy.  His symptoms have been present for greater than a year.  I recommend IV iron  and close clinical follow-up.  He will call for increased symptoms of anemia.  We reviewed potential side effects associated with IV iron  including the chance of allergic/anaphylactic reaction.  He agrees to proceed.  He will be scheduled for IV iron  weekly for 2 treatments beginning on 05/26/2023.  He will return for an office and lab visit in 3 weeks.  I will defer further evaluation for source of blood loss to his primary provider and Dr. Gayl Katos.  Coni Deep, MD   05/23/2023, 4:53 PM

## 2023-05-24 ENCOUNTER — Other Ambulatory Visit: Payer: Self-pay | Admitting: Adult Health

## 2023-05-24 DIAGNOSIS — D508 Other iron deficiency anemias: Secondary | ICD-10-CM

## 2023-05-24 NOTE — Telephone Encounter (Signed)
Chart reflects [facility] visit DOS 04/20/23 for 2 day prep for procedure/ colonoscopy.

## 2023-05-26 ENCOUNTER — Inpatient Hospital Stay: Payer: Medicare Other

## 2023-05-26 VITALS — BP 124/46 | HR 83 | Temp 98.2°F | Resp 17

## 2023-05-26 DIAGNOSIS — D509 Iron deficiency anemia, unspecified: Secondary | ICD-10-CM | POA: Diagnosis not present

## 2023-05-26 DIAGNOSIS — D649 Anemia, unspecified: Secondary | ICD-10-CM

## 2023-05-26 DIAGNOSIS — E611 Iron deficiency: Secondary | ICD-10-CM

## 2023-05-26 DIAGNOSIS — E119 Type 2 diabetes mellitus without complications: Secondary | ICD-10-CM | POA: Diagnosis not present

## 2023-05-26 DIAGNOSIS — Z87891 Personal history of nicotine dependence: Secondary | ICD-10-CM | POA: Diagnosis not present

## 2023-05-26 DIAGNOSIS — Z7902 Long term (current) use of antithrombotics/antiplatelets: Secondary | ICD-10-CM | POA: Diagnosis not present

## 2023-05-26 DIAGNOSIS — I10 Essential (primary) hypertension: Secondary | ICD-10-CM | POA: Diagnosis not present

## 2023-05-26 DIAGNOSIS — Z7982 Long term (current) use of aspirin: Secondary | ICD-10-CM | POA: Diagnosis not present

## 2023-05-26 MED ORDER — SODIUM CHLORIDE 0.9 % IV SOLN: INTRAVENOUS | Status: DC

## 2023-05-26 MED ORDER — SODIUM CHLORIDE 0.9 % IV SOLN
510.0000 mg | Freq: Once | INTRAVENOUS | Status: AC
  Administered 2023-05-26: 510 mg via INTRAVENOUS
  Filled 2023-05-26: qty 510

## 2023-05-26 NOTE — Patient Instructions (Signed)

## 2023-05-31 ENCOUNTER — Other Ambulatory Visit: Payer: Self-pay | Admitting: Adult Health

## 2023-05-31 DIAGNOSIS — D500 Iron deficiency anemia secondary to blood loss (chronic): Secondary | ICD-10-CM

## 2023-05-31 NOTE — Telephone Encounter (Signed)
 Ordered CT abdomen and discussed with patient over the phone.

## 2023-06-01 NOTE — Telephone Encounter (Signed)
 Left message on voicemail for patient to return call when available

## 2023-06-02 ENCOUNTER — Inpatient Hospital Stay: Payer: Medicare Other

## 2023-06-02 ENCOUNTER — Other Ambulatory Visit: Payer: Self-pay | Admitting: Pharmacist

## 2023-06-02 VITALS — BP 148/59 | HR 77 | Temp 98.7°F | Resp 16 | Wt 187.9 lb

## 2023-06-02 DIAGNOSIS — Z7902 Long term (current) use of antithrombotics/antiplatelets: Secondary | ICD-10-CM | POA: Diagnosis not present

## 2023-06-02 DIAGNOSIS — Z7982 Long term (current) use of aspirin: Secondary | ICD-10-CM | POA: Diagnosis not present

## 2023-06-02 DIAGNOSIS — I10 Essential (primary) hypertension: Secondary | ICD-10-CM | POA: Diagnosis not present

## 2023-06-02 DIAGNOSIS — D649 Anemia, unspecified: Secondary | ICD-10-CM

## 2023-06-02 DIAGNOSIS — E119 Type 2 diabetes mellitus without complications: Secondary | ICD-10-CM | POA: Diagnosis not present

## 2023-06-02 DIAGNOSIS — E611 Iron deficiency: Secondary | ICD-10-CM

## 2023-06-02 DIAGNOSIS — Z87891 Personal history of nicotine dependence: Secondary | ICD-10-CM | POA: Diagnosis not present

## 2023-06-02 DIAGNOSIS — D509 Iron deficiency anemia, unspecified: Secondary | ICD-10-CM | POA: Diagnosis not present

## 2023-06-02 MED ORDER — SODIUM CHLORIDE 0.9 % IV SOLN
510.0000 mg | Freq: Once | INTRAVENOUS | Status: AC
  Administered 2023-06-02: 510 mg via INTRAVENOUS
  Filled 2023-06-02: qty 17

## 2023-06-02 MED ORDER — SODIUM CHLORIDE 0.9 % IV SOLN: Freq: Once | INTRAVENOUS | Status: DC | PRN

## 2023-06-02 MED ORDER — SODIUM CHLORIDE 0.9 % IV SOLN: INTRAVENOUS | Status: DC

## 2023-06-02 NOTE — Progress Notes (Unsigned)
 Patient ID: Randy Jacobson, male   DOB: [birth year], 78 y.o.   MRN: [id number]  Reason for Consult: No chief complaint on file.   Referred by Medina-Vargas, Monina C*  Subjective:     HPI  Randy Jacobson is a 78 y.o. male who presents for follow-up for carotid artery stenosis and PAD.  He has a known 90% stenosis of his right ICA by CTA and at his last visit we discussed intervention but he was not interested in proceeding at that time.  He continues to be asymptomatic and denies one-sided weakness, numbness, amaurosis or speech issues. Regarding his PAD he does have a left femoral to below-knee popliteal artery bypass with reversed great saphenous vein.  He he denies claudication or rest pain and ambulates without any issue.  He is compliant with aspirin, Plavix and Lipitor.  Past Medical History:  Diagnosis Date   Diabetes mellitus without complication (HCC)    Dysrhythmia    chronic RBBB; bradycardia s/p atropine following sheath removal, 3.8 second sinus pause 10/11/22   Hypertension    Peripheral artery disease (HCC)    Family History  Problem Relation Age of Onset   Diabetes Brother    Past Surgical History:  Procedure Laterality Date   "stents in both my legs"     ABDOMINAL AORTOGRAM W/LOWER EXTREMITY N/A 10/11/2022   Procedure: ABDOMINAL AORTOGRAM W/LOWER EXTREMITY;  Surgeon: Chuck Hint, MD;  Location: [facility] INVASIVE CV LAB;  Service: Cardiovascular;  Laterality: N/A;   COLONOSCOPY WITH PROPOFOL N/A 01/18/2023   Procedure: COLONOSCOPY WITH PROPOFOL;  Surgeon: Kerin Salen, MD;  Location: [facility] ENDOSCOPY;  Service: Gastroenterology;  Laterality: N/A;   ENDARTERECTOMY FEMORAL Left 10/15/2022   Procedure: ENDARTERECTOMY OF LEFT ILIOFEMORAL ARTERY;  Surgeon: Chuck Hint, MD;  Location: [facility] OR;  Service: Vascular;  Laterality: Left;   ESOPHAGOGASTRODUODENOSCOPY (EGD) WITH PROPOFOL N/A 01/18/2023   Procedure: ESOPHAGOGASTRODUODENOSCOPY (EGD) WITH PROPOFOL;  Surgeon: Kerin Salen, MD;  Location: [facility] ENDOSCOPY;  Service: Gastroenterology;  Laterality: N/A;   FEMORAL-POPLITEAL BYPASS GRAFT Left 10/15/2022   Procedure: LEFT FEMORAL-BELOW KNEE POPLITEAL ARTERY BYPASS WITH VEIN HARVEST OF LEFT GREATER SAPHENOUS VEIN;  Surgeon: Chuck Hint, MD;  Location: [facility] OR;  Service: Vascular;  Laterality: Left;   GIVENS CAPSULE STUDY N/A 04/10/2017   Procedure: GIVENS CAPSULE STUDY;  Surgeon: Charlott Rakes, MD;  Location: [facility] ENDOSCOPY;  Service: Endoscopy;  Laterality: N/A;   GIVENS CAPSULE STUDY N/A 01/18/2023   Procedure: GIVENS CAPSULE STUDY;  Surgeon: Kerin Salen, MD;  Location: [facility] ENDOSCOPY;  Service: Gastroenterology;  Laterality: N/A;   IR GENERIC HISTORICAL  08/26/2015   IR RADIOLOGIST EVAL & MGMT 08/26/2015 Malachy Moan, MD [facility] INTERV RAD   IR GENERIC HISTORICAL  04/21/2016   IR RADIOLOGIST EVAL & MGMT 04/21/2016 Malachy Moan, MD [facility] INTERV RAD   [facility] ANGIOPLASTY Left 10/15/2022   Procedure: PATCH ANGIOPLASTY USING XENOSURE BIOLOGIC PATCH 1CMX14CM;  Surgeon: Chuck Hint, MD;  Location: [facility] OR;  Service: Vascular;  Laterality: Left;   PERIPHERAL VASCULAR INTERVENTION  10/11/2022   Procedure: PERIPHERAL VASCULAR INTERVENTION;  Surgeon: Chuck Hint, MD;  Location: [facility] INVASIVE CV LAB;  Service: Cardiovascular;;    Short Social History:  Social History   Tobacco Use   Smoking status: Former    Current packs/day: 0.00    Average packs/day: 1 pack/day for 36.0 years (36.0 ttl pk-yrs)    Types: Cigarettes    Start date: 11/20/1957    Quit date: 11/20/1993  Years since quitting: 29.5   Smokeless tobacco: Never  Substance Use Topics   Alcohol use: Yes    Alcohol/week: 0.0 standard drinks of alcohol    Comment: occ    No Known Allergies  Current Outpatient Medications  Medication Sig Dispense Refill   albuterol (VENTOLIN HFA) 108 (90 Base) MCG/ACT inhaler Inhale 2 puffs into the lungs as needed for wheezing or shortness of  breath.     Ascorbic Acid (VITAMIN C) 1000 MG tablet Take 1 tablet (1,000 mg total) by mouth daily.     aspirin EC 81 MG tablet Take 1 tablet (81 mg total) by mouth daily at 6 (six) AM. Swallow whole. 30 tablet 12   atorvastatin (LIPITOR) 40 MG tablet Take 40 mg by mouth daily.     budesonide-formoterol (SYMBICORT) 160-4.5 MCG/ACT inhaler Inhale 2 puffs into the lungs 2 (two) times daily. Rinse mouth with water after each use 1 each 1   clopidogrel (PLAVIX) 75 MG tablet Take 1 tablet (75 mg total) by mouth daily. 90 tablet 1   Continuous Glucose Receiver (FREESTYLE LIBRE 2 READER) DEVI 1 Device by Does not apply route 3 (three) times daily. E11.69 1 each 11   Continuous Glucose Sensor (FREESTYLE LIBRE 2 SENSOR) MISC 2 Devices by Does not apply route daily. E11.69 2 each 11   Emollient (EUCERIN) lotion Apply 1 Application topically as needed for dry skin.     ferrous sulfate 325 (65 FE) MG tablet Take 1 tablet (325 mg total) by mouth daily with breakfast. 90 tablet 3   hydrochlorothiazide (HYDRODIURIL) 25 MG tablet Take 0.5 tablets (12.5 mg total) by mouth daily. 45 tablet 1   losartan (COZAAR) 50 MG tablet Take 50 mg by mouth in the morning and at bedtime.     Melatonin 10 MG TABS Take 10 mg by mouth at bedtime.     metFORMIN (GLUCOPHAGE) 1000 MG tablet Take 1 tablet (1,000 mg total) by mouth 2 (two) times daily with a meal. 180 tablet 1   OVER THE COUNTER MEDICATION *Has a special pink pill from [place] that he takes as needed to lower BG readings when high- could not tell me the name during call today     pantoprazole (PROTONIX) 40 MG tablet Take 40 mg by mouth daily.     TRESIBA FLEXTOUCH 100 UNIT/ML FlexTouch Pen Inject 38 Units into the skin daily. 11.4 mL 0   No current facility-administered medications for this visit.   Facility-Administered Medications Ordered in Other Visits  Medication Dose Route Frequency Provider Last Rate Last Admin   0.9 %  sodium chloride infusion   Intravenous  Continuous Thornton Papas B, MD       0.9 %  sodium chloride infusion   Intravenous Once PRN Ladene Artist, MD 999 mL/hr at 06/02/23 1244 New Bag at 06/02/23 1244    REVIEW OF SYSTEMS  Positive for ***  All other systems were reviewed and are negative     Objective:  Objective   There were no vitals filed for this visit. There is no height or weight on file to calculate BMI.  Physical Exam General: no acute distress Cardiac: hemodynamically stable Pulm: normal work of breathing Neuro: alert, no focal deficit Extremities: no edema, cyanosis or wounds   Data: ABI ***  LLE Duplex ***     Assessment/Plan:     DAREK EIFLER is a 78 y.o. male with PAD and asymptomatic carotid stenosis.  He has a severe greater than  90% stenosis of the right ICA by CTA. Last month he elected to continue with medical management and declined carotid intervention. Today we rediscussed the statistics and risk of stroke in 5 years with or without intervention which I quoted to be 12% with medical management and 5%. At this time he would like to continue with medical management***  Regarding his PAD.  His noninvasive vascular labs demonstrate ***    Recommendations to optimize cardiovascular risk: Abstinence from all tobacco products. Blood glucose control with goal A1c < 7%. Blood pressure control with goal blood pressure < 140/90 mmHg. Lipid reduction therapy with goal LDL-C <100 mg/dL  Aspirin 81mg  PO QD.  Atorvastatin 40-80mg  PO QD (or other "high intensity" statin therapy).     Daria Pastures MD [facility]

## 2023-06-02 NOTE — Progress Notes (Signed)
   06/02/2023  Patient ID: Randy Jacobson, male   DOB: September 29, 1945, 78 y.o.   MRN: [id number]  Called and spoke with the patient on the phone today regarding Symbicort cost concerns. Reports it was about $600 at the pharmacy.  Advised he has a $255 drug deductible. Therefore any Tier 3-5 brand medications will be greater than this amount for the first fill. Wixela would be $50 per month, but the others will be more expensive prior to meeting deductible amount.   Taking albuterol 1-2 puffs per day. Never started a long-acting inhaler.  Of note, patient reports having a pink pill from [place] that he takes as needed to help drop his BG readings. Unknown name as he did not have the bottle with him currently.  Plan:  - Sent application for Breztri to the patient through email - Reports being able to print from his email and can sign the forms, then will bring to [facility] office for Dr. Grayce Sessions to call   Marlowe Aschoff, PharmD [facility] Phone Number: [phone]

## 2023-06-02 NOTE — H&P (View-Only) (Signed)
 Patient ID: Randy Jacobson, male   DOB: [birth year], 78 y.o.   MRN: [id number]  Reason for Consult: No chief complaint on file.   Referred by Medina-Vargas, Monina C*  Subjective:     HPI  Randy Jacobson is a 78 y.o. male who presents for follow-up for carotid artery stenosis and PAD.  He has a known 90% stenosis of his right ICA by CTA and at his last visit we discussed intervention but he was not interested in proceeding at that time.  He continues to be asymptomatic and denies one-sided weakness, numbness, amaurosis or speech issues. Regarding his PAD he does have a left femoral to below-knee popliteal artery bypass with reversed great saphenous vein.  He he denies claudication or rest pain and ambulates without any issue.  He is compliant with aspirin, Plavix and Lipitor.  Past Medical History:  Diagnosis Date   Diabetes mellitus without complication (HCC)    Dysrhythmia    chronic RBBB; bradycardia s/p atropine following sheath removal, 3.8 second sinus pause 10/11/22   Hypertension    Peripheral artery disease (HCC)    Family History  Problem Relation Age of Onset   Diabetes Brother    Past Surgical History:  Procedure Laterality Date   "stents in both my legs"     ABDOMINAL AORTOGRAM W/LOWER EXTREMITY N/A 10/11/2022   Procedure: ABDOMINAL AORTOGRAM W/LOWER EXTREMITY;  Surgeon: Chuck Hint, MD;  Location: [facility] INVASIVE CV LAB;  Service: Cardiovascular;  Laterality: N/A;   COLONOSCOPY WITH PROPOFOL N/A 01/18/2023   Procedure: COLONOSCOPY WITH PROPOFOL;  Surgeon: Kerin Salen, MD;  Location: [facility] ENDOSCOPY;  Service: Gastroenterology;  Laterality: N/A;   ENDARTERECTOMY FEMORAL Left 10/15/2022   Procedure: ENDARTERECTOMY OF LEFT ILIOFEMORAL ARTERY;  Surgeon: Chuck Hint, MD;  Location: [facility] OR;  Service: Vascular;  Laterality: Left;   ESOPHAGOGASTRODUODENOSCOPY (EGD) WITH PROPOFOL N/A 01/18/2023   Procedure: ESOPHAGOGASTRODUODENOSCOPY (EGD) WITH PROPOFOL;  Surgeon: Kerin Salen, MD;  Location: [facility] ENDOSCOPY;  Service: Gastroenterology;  Laterality: N/A;   FEMORAL-POPLITEAL BYPASS GRAFT Left 10/15/2022   Procedure: LEFT FEMORAL-BELOW KNEE POPLITEAL ARTERY BYPASS WITH VEIN HARVEST OF LEFT GREATER SAPHENOUS VEIN;  Surgeon: Chuck Hint, MD;  Location: [facility] OR;  Service: Vascular;  Laterality: Left;   GIVENS CAPSULE STUDY N/A 04/10/2017   Procedure: GIVENS CAPSULE STUDY;  Surgeon: Charlott Rakes, MD;  Location: [facility] ENDOSCOPY;  Service: Endoscopy;  Laterality: N/A;   GIVENS CAPSULE STUDY N/A 01/18/2023   Procedure: GIVENS CAPSULE STUDY;  Surgeon: Kerin Salen, MD;  Location: [facility] ENDOSCOPY;  Service: Gastroenterology;  Laterality: N/A;   IR GENERIC HISTORICAL  08/26/2015   IR RADIOLOGIST EVAL & MGMT 08/26/2015 Malachy Moan, MD [facility] INTERV RAD   IR GENERIC HISTORICAL  04/21/2016   IR RADIOLOGIST EVAL & MGMT 04/21/2016 Malachy Moan, MD [facility] INTERV RAD   [facility] ANGIOPLASTY Left 10/15/2022   Procedure: PATCH ANGIOPLASTY USING XENOSURE BIOLOGIC PATCH 1CMX14CM;  Surgeon: Chuck Hint, MD;  Location: [facility] OR;  Service: Vascular;  Laterality: Left;   PERIPHERAL VASCULAR INTERVENTION  10/11/2022   Procedure: PERIPHERAL VASCULAR INTERVENTION;  Surgeon: Chuck Hint, MD;  Location: [facility] INVASIVE CV LAB;  Service: Cardiovascular;;    Short Social History:  Social History   Tobacco Use   Smoking status: Former    Current packs/day: 0.00    Average packs/day: 1 pack/day for 36.0 years (36.0 ttl pk-yrs)    Types: Cigarettes    Start date: 11/20/1957    Quit date: 11/20/1993  Years since quitting: 29.5   Smokeless tobacco: Never  Substance Use Topics   Alcohol use: Yes    Alcohol/week: 0.0 standard drinks of alcohol    Comment: occ    No Known Allergies  Current Outpatient Medications  Medication Sig Dispense Refill   albuterol (VENTOLIN HFA) 108 (90 Base) MCG/ACT inhaler Inhale 2 puffs into the lungs as needed for wheezing or shortness of  breath.     Ascorbic Acid (VITAMIN C) 1000 MG tablet Take 1 tablet (1,000 mg total) by mouth daily.     aspirin EC 81 MG tablet Take 1 tablet (81 mg total) by mouth daily at 6 (six) AM. Swallow whole. 30 tablet 12   atorvastatin (LIPITOR) 40 MG tablet Take 40 mg by mouth daily.     budesonide-formoterol (SYMBICORT) 160-4.5 MCG/ACT inhaler Inhale 2 puffs into the lungs 2 (two) times daily. Rinse mouth with water after each use 1 each 1   clopidogrel (PLAVIX) 75 MG tablet Take 1 tablet (75 mg total) by mouth daily. 90 tablet 1   Continuous Glucose Receiver (FREESTYLE LIBRE 2 READER) DEVI 1 Device by Does not apply route 3 (three) times daily. E11.69 1 each 11   Continuous Glucose Sensor (FREESTYLE LIBRE 2 SENSOR) MISC 2 Devices by Does not apply route daily. E11.69 2 each 11   Emollient (EUCERIN) lotion Apply 1 Application topically as needed for dry skin.     ferrous sulfate 325 (65 FE) MG tablet Take 1 tablet (325 mg total) by mouth daily with breakfast. 90 tablet 3   hydrochlorothiazide (HYDRODIURIL) 25 MG tablet Take 0.5 tablets (12.5 mg total) by mouth daily. 45 tablet 1   losartan (COZAAR) 50 MG tablet Take 50 mg by mouth in the morning and at bedtime.     Melatonin 10 MG TABS Take 10 mg by mouth at bedtime.     metFORMIN (GLUCOPHAGE) 1000 MG tablet Take 1 tablet (1,000 mg total) by mouth 2 (two) times daily with a meal. 180 tablet 1   OVER THE COUNTER MEDICATION *Has a special pink pill from [place] that he takes as needed to lower BG readings when high- could not tell me the name during call today     pantoprazole (PROTONIX) 40 MG tablet Take 40 mg by mouth daily.     TRESIBA FLEXTOUCH 100 UNIT/ML FlexTouch Pen Inject 38 Units into the skin daily. 11.4 mL 0   No current facility-administered medications for this visit.   Facility-Administered Medications Ordered in Other Visits  Medication Dose Route Frequency Provider Last Rate Last Admin   0.9 %  sodium chloride infusion   Intravenous  Continuous Thornton Papas B, MD       0.9 %  sodium chloride infusion   Intravenous Once PRN Ladene Artist, MD 999 mL/hr at 06/02/23 1244 New Bag at 06/02/23 1244    REVIEW OF SYSTEMS  Positive for ***  All other systems were reviewed and are negative     Objective:  Objective   There were no vitals filed for this visit. There is no height or weight on file to calculate BMI.  Physical Exam General: no acute distress Cardiac: hemodynamically stable Pulm: normal work of breathing Neuro: alert, no focal deficit Extremities: no edema, cyanosis or wounds   Data: ABI ***  LLE Duplex ***     Assessment/Plan:     Randy Jacobson is a 78 y.o. male with PAD and asymptomatic carotid stenosis.  He has a severe greater than  90% stenosis of the right ICA by CTA. Last month he elected to continue with medical management and declined carotid intervention. Today we rediscussed the statistics and risk of stroke in 5 years with or without intervention which I quoted to be 12% with medical management and 5%. At this time he would like to continue with medical management***  Regarding his PAD.  His noninvasive vascular labs demonstrate ***    Recommendations to optimize cardiovascular risk: Abstinence from all tobacco products. Blood glucose control with goal A1c < 7%. Blood pressure control with goal blood pressure < 140/90 mmHg. Lipid reduction therapy with goal LDL-C <100 mg/dL  Aspirin 81mg  PO QD.  Atorvastatin 40-80mg  PO QD (or other "high intensity" statin therapy).     Daria Pastures MD [facility]

## 2023-06-02 NOTE — Patient Instructions (Signed)

## 2023-06-03 ENCOUNTER — Encounter: Payer: Self-pay | Admitting: Vascular Surgery

## 2023-06-03 ENCOUNTER — Ambulatory Visit (HOSPITAL_COMMUNITY)
Admission: RE | Admit: 2023-06-03 | Discharge: 2023-06-03 | Disposition: A | Discharge status: Home / Self Care | Payer: Medicare Other | Source: Ambulatory Visit | Attending: Vascular Surgery | Admitting: Vascular Surgery

## 2023-06-03 ENCOUNTER — Ambulatory Visit (INDEPENDENT_AMBULATORY_CARE_PROVIDER_SITE_OTHER)
Admission: RE | Admit: 2023-06-03 | Discharge: 2023-06-03 | Disposition: A | Discharge status: Home / Self Care | Payer: Medicare Other | Source: Ambulatory Visit | Attending: Vascular Surgery | Admitting: Vascular Surgery

## 2023-06-03 ENCOUNTER — Ambulatory Visit: Payer: Medicare Other | Admitting: Vascular Surgery

## 2023-06-03 VITALS — BP 150/73 | HR 83 | Temp 98.6°F | Resp 20 | Ht 71.0 in | Wt 188.8 lb

## 2023-06-03 DIAGNOSIS — I739 Peripheral vascular disease, unspecified: Secondary | ICD-10-CM | POA: Insufficient documentation

## 2023-06-03 DIAGNOSIS — I6521 Occlusion and stenosis of right carotid artery: Secondary | ICD-10-CM

## 2023-06-03 LAB — VAS US ABI WITH/WO TBI: Left ABI: 0.88

## 2023-06-06 ENCOUNTER — Other Ambulatory Visit: Payer: Self-pay

## 2023-06-06 DIAGNOSIS — I739 Peripheral vascular disease, unspecified: Secondary | ICD-10-CM

## 2023-06-06 NOTE — Telephone Encounter (Signed)
 Patient stated he is taking the 38 units

## 2023-06-07 ENCOUNTER — Ambulatory Visit
Admission: RE | Admit: 2023-06-07 | Discharge: 2023-06-07 | Disposition: A | Discharge status: Home / Self Care | Payer: Medicare Other | Source: Ambulatory Visit | Attending: Adult Health | Admitting: Adult Health

## 2023-06-07 DIAGNOSIS — K5730 Diverticulosis of large intestine without perforation or abscess without bleeding: Secondary | ICD-10-CM | POA: Diagnosis not present

## 2023-06-07 DIAGNOSIS — N281 Cyst of kidney, acquired: Secondary | ICD-10-CM | POA: Diagnosis not present

## 2023-06-07 DIAGNOSIS — I700 Atherosclerosis of aorta: Secondary | ICD-10-CM | POA: Diagnosis not present

## 2023-06-07 DIAGNOSIS — K8020 Calculus of gallbladder without cholecystitis without obstruction: Secondary | ICD-10-CM | POA: Diagnosis not present

## 2023-06-07 DIAGNOSIS — D500 Iron deficiency anemia secondary to blood loss (chronic): Secondary | ICD-10-CM

## 2023-06-09 ENCOUNTER — Inpatient Hospital Stay: Payer: Medicare Other

## 2023-06-09 ENCOUNTER — Inpatient Hospital Stay: Payer: Medicare Other | Admitting: Oncology

## 2023-06-09 VITALS — BP 140/56 | HR 90 | Temp 98.2°F | Resp 18 | Ht 71.0 in | Wt 186.2 lb

## 2023-06-09 DIAGNOSIS — I10 Essential (primary) hypertension: Secondary | ICD-10-CM | POA: Diagnosis not present

## 2023-06-09 DIAGNOSIS — D509 Iron deficiency anemia, unspecified: Secondary | ICD-10-CM

## 2023-06-09 DIAGNOSIS — Z87891 Personal history of nicotine dependence: Secondary | ICD-10-CM | POA: Diagnosis not present

## 2023-06-09 DIAGNOSIS — Z7982 Long term (current) use of aspirin: Secondary | ICD-10-CM | POA: Diagnosis not present

## 2023-06-09 DIAGNOSIS — E119 Type 2 diabetes mellitus without complications: Secondary | ICD-10-CM | POA: Diagnosis not present

## 2023-06-09 DIAGNOSIS — Z7902 Long term (current) use of antithrombotics/antiplatelets: Secondary | ICD-10-CM | POA: Diagnosis not present

## 2023-06-09 LAB — CBC WITH DIFFERENTIAL (CANCER CENTER ONLY)
Abs Immature Granulocytes: 0.05 10*3/uL (ref 0.00–0.07)
Basophils Absolute: 0.1 10*3/uL (ref 0.0–0.1)
Basophils Relative: 1 %
Eosinophils Absolute: 0.3 10*3/uL (ref 0.0–0.5)
Eosinophils Relative: 4 %
HCT: 30.7 % — ABNORMAL LOW (ref 39.0–52.0)
Hemoglobin: 9.5 g/dL — ABNORMAL LOW (ref 13.0–17.0)
Immature Granulocytes: 1 %
Lymphocytes Relative: 19 %
Lymphs Abs: 1.2 10*3/uL (ref 0.7–4.0)
MCH: 27.6 pg (ref 26.0–34.0)
MCHC: 30.9 g/dL (ref 30.0–36.0)
MCV: 89.2 fL (ref 80.0–100.0)
Monocytes Absolute: 1.2 10*3/uL — ABNORMAL HIGH (ref 0.1–1.0)
Monocytes Relative: 17 %
Neutro Abs: 3.9 10*3/uL (ref 1.7–7.7)
Neutrophils Relative %: 58 %
Platelet Count: 196 10*3/uL (ref 150–400)
RBC: 3.44 MIL/uL — ABNORMAL LOW (ref 4.22–5.81)
RDW: 25.1 % — ABNORMAL HIGH (ref 11.5–15.5)
WBC Count: 6.7 10*3/uL (ref 4.0–10.5)
nRBC: 0 % (ref 0.0–0.2)

## 2023-06-09 NOTE — Progress Notes (Signed)
 [facility] OFFICE PROGRESS NOTE   Diagnosis: Iron deficiency anemia  INTERVAL HISTORY:   Mr Spadafore returns as scheduled.  He was treated with IV iron on 05/26/2023 and 06/02/2023.  He reports tolerating the iron well.  No symptom of an allergic reaction.  He reports marked improvement in his energy level following the IV iron.  His stool is dark.  He continues iron.  No bleeding.  Objective:  Vital signs in last 24 hours:  Blood pressure (!) 140/56, pulse 90, temperature 98.2 F (36.8 C), temperature source Temporal, resp. rate 18, height 5\' 11"  (1.803 m), weight 186 lb 3.2 oz (84.5 kg), SpO2 100%.    Resp: Lungs clear bilaterally, distant breath sounds Cardio: Regular rate and rhythm GI: No hepatosplenomegaly, nontender Vascular: The left lower leg is slightly larger than the right side  Skin: Hyperpigmented lesions at the left lower leg  Lab Results:  Lab Results  Component Value Date   WBC 6.7 06/09/2023   HGB 9.5 (L) 06/09/2023   HCT 30.7 (L) 06/09/2023   MCV 89.2 06/09/2023   PLT 196 06/09/2023   NEUTROABS 3.9 06/09/2023    CMP  Lab Results  Component Value Date   NA 138 05/23/2023   K 4.3 05/23/2023   CL 101 05/23/2023   CO2 25 05/23/2023   GLUCOSE 127 (H) 05/23/2023   BUN 16 05/23/2023   CREATININE 0.98 05/23/2023   CALCIUM 9.0 05/23/2023   PROT 7.7 05/23/2023   ALBUMIN 4.4 05/23/2023   AST 17 05/23/2023   ALT 13 05/23/2023   ALKPHOS 78 05/23/2023   BILITOT 0.5 05/23/2023   GFRNONAA >60 05/23/2023   GFRAA 94 05/21/2019    No results found for: "CEA1", "CEA", "NWG956", "CA125"  Lab Results  Component Value Date   INR 1.1 10/15/2022   LABPROT 14.5 10/15/2022    Imaging:  CT ABDOMEN PELVIS WO CONTRAST Result Date: 06/07/2023 CLINICAL DATA:  Lower GI bleed.  Severe anemia EXAM: CT ABDOMEN AND PELVIS WITHOUT CONTRAST TECHNIQUE: Multidetector CT imaging of the abdomen and pelvis was performed following the standard protocol without  IV contrast. RADIATION DOSE REDUCTION: This exam was performed according to the departmental dose-optimization program which includes automated exposure control, adjustment of the mA and/or kV according to patient size and/or use of iterative reconstruction technique. COMPARISON:  None Available. FINDINGS: Lower chest: No infiltrates or consolidations, no pleural effusions Hepatobiliary: Liver normal size no masses no biliary dilatation. Gallbladder unremarkable. Small gallstones without gallbladder inflammatory changes Pancreas: Pancreas normal size. No masses calcifications or inflammatory changes. Spleen: Spleen normal size.  No masses. Adrenals/Urinary Tract: Adrenal glands are normal size. Follow-up recommended. Kidneys are normal. No masses calcifications or hydronephrosis. 2.7 x 2.5 cm cortical cyst of the posteromedial cortical region the upper pole distribution left kidney. The large majority of Bosniak IIF masses are benign. When malignant, nearly all are indolent. Generally, Bosniak IIF masses are followed by imaging at 6 months and 12 months (MRI preferred over CT), then annually for a total of 5 years to assess for morphologic change. (Reference: Bosniak Classification of Cystic Renal Masses, Version 2019. Radiology 2019; 292(2): [phone].). Calcifications of the kidneys likely vascular instead of kidney stones. Stomach/Bowel: No small or large bowel obstruction or inflammatory changes. Moderate amount of residual fecal material throughout the colon without obstruction or constipation. Diffuse extensive diverticulosis of the descending and sigmoid colon without evidence of diverticulitis. Vascular/Lymphatic: Calcifications of the abdominal aorta without aneurysm. Reproductive: .  No masses. Bladder unremarkable.  Other: Anterior abdominal wall unremarkable without evidence of umbilical or inguinal hernias Musculoskeletal: Visualized portion of the thoracolumbar spine and pelvic structures grossly  unremarkable without evidence of fracture bony abnormalities or soft tissue masses. IMPRESSION: *No acute findings in the abdomen or pelvis. *Extensive diverticulosis of the descending and sigmoid colon without evidence of diverticulitis. *Cholelithiasis without evidence of acute cholecystitis. *2.7 x 2.5 cm cortical cyst of the posteromedial cortical region the upper pole distribution left kidney. The large majority of Bosniak IIF masses are benign. Generally, Bosniak IIF masses are followed by imaging at 6 months and 12 months (MRI preferred over CT), then annually for a total of 5 years to assess for morphologic change. *Aortic atherosclerosis. Electronically Signed   By: Shaaron Adler M.D.   On: 06/07/2023 15:12    Medications: I have reviewed the patient's current medications.   Assessment/Plan: Anemia Chronic anemia with intermittent Red cell microcytosis Documented iron deficiency on repeat ferritin testing including 05/23/2023 He has received Feraheme and Venofer in the past Negative GI workup for a source of blood loss including upper endoscopy, colonoscopy, and small bowel camera endoscopy 01/18/2023 Feraheme 05/26/2023, 06/02/2023 History of iron deficiency anemia in 2018 Gastric ulcers 2018 4.   Diabetes 5.   Hypertension 6.   Peripheral vascular disease-maintained on aspirin and Plavix 7.   Carotid artery stenosis 8.   Peripheral neuropathy     Disposition: Mr. Rama has iron deficiency anemia.  He was treated with IV iron earlier this month.  The hemoglobin has improved.  He is symptoms from anemia are improved.  He will continue oral iron.  He will return for a CBC and ferritin level and approximately 2 weeks.  He will be scheduled for an office visit in 1 month.  Mr Marriott is scheduled to see vascular surgery for an angiogram and intervention treatment of a stenosis at the distal left lower extremity anastomosis.  Thornton Papas, MD  06/09/2023  9:16 AM

## 2023-06-10 NOTE — Progress Notes (Signed)
-    will need to follow up with oncology as discussed.

## 2023-06-20 ENCOUNTER — Ambulatory Visit (HOSPITAL_BASED_OUTPATIENT_CLINIC_OR_DEPARTMENT_OTHER)
Admission: RE | Admit: 2023-06-20 | Discharge: 2023-06-20 | Disposition: A | Discharge status: Home / Self Care | Payer: Medicare Other | Source: Home / Self Care | Attending: Vascular Surgery | Admitting: Vascular Surgery

## 2023-06-20 ENCOUNTER — Other Ambulatory Visit: Payer: Self-pay

## 2023-06-20 ENCOUNTER — Encounter (HOSPITAL_COMMUNITY)
Admission: RE | Disposition: A | Discharge status: Home / Self Care | Payer: Self-pay | Source: Home / Self Care | Attending: Vascular Surgery

## 2023-06-20 DIAGNOSIS — I739 Peripheral vascular disease, unspecified: Secondary | ICD-10-CM

## 2023-06-20 DIAGNOSIS — Y832 Surgical operation with anastomosis, bypass or graft as the cause of abnormal reaction of the patient, or of later complication, without mention of misadventure at the time of the procedure: Secondary | ICD-10-CM | POA: Insufficient documentation

## 2023-06-20 DIAGNOSIS — I6521 Occlusion and stenosis of right carotid artery: Secondary | ICD-10-CM | POA: Insufficient documentation

## 2023-06-20 DIAGNOSIS — T82898A Other specified complication of vascular prosthetic devices, implants and grafts, initial encounter: Secondary | ICD-10-CM

## 2023-06-20 DIAGNOSIS — D62 Acute posthemorrhagic anemia: Secondary | ICD-10-CM | POA: Diagnosis not present

## 2023-06-20 DIAGNOSIS — E785 Hyperlipidemia, unspecified: Secondary | ICD-10-CM | POA: Diagnosis not present

## 2023-06-20 DIAGNOSIS — Z860101 Personal history of adenomatous and serrated colon polyps: Secondary | ICD-10-CM | POA: Diagnosis not present

## 2023-06-20 DIAGNOSIS — Z7984 Long term (current) use of oral hypoglycemic drugs: Secondary | ICD-10-CM | POA: Insufficient documentation

## 2023-06-20 DIAGNOSIS — Z7902 Long term (current) use of antithrombotics/antiplatelets: Secondary | ICD-10-CM | POA: Insufficient documentation

## 2023-06-20 DIAGNOSIS — I70401 Unspecified atherosclerosis of autologous vein bypass graft(s) of the extremities, right leg: Secondary | ICD-10-CM | POA: Diagnosis not present

## 2023-06-20 DIAGNOSIS — Z794 Long term (current) use of insulin: Secondary | ICD-10-CM | POA: Insufficient documentation

## 2023-06-20 DIAGNOSIS — Z79899 Other long term (current) drug therapy: Secondary | ICD-10-CM | POA: Insufficient documentation

## 2023-06-20 DIAGNOSIS — T82858A Stenosis of vascular prosthetic devices, implants and grafts, initial encounter: Secondary | ICD-10-CM | POA: Insufficient documentation

## 2023-06-20 DIAGNOSIS — I998 Other disorder of circulatory system: Secondary | ICD-10-CM | POA: Diagnosis not present

## 2023-06-20 DIAGNOSIS — E1151 Type 2 diabetes mellitus with diabetic peripheral angiopathy without gangrene: Secondary | ICD-10-CM | POA: Insufficient documentation

## 2023-06-20 DIAGNOSIS — I70322 Atherosclerosis of unspecified type of bypass graft(s) of the extremities with rest pain, left leg: Secondary | ICD-10-CM | POA: Insufficient documentation

## 2023-06-20 DIAGNOSIS — Z87891 Personal history of nicotine dependence: Secondary | ICD-10-CM | POA: Insufficient documentation

## 2023-06-20 DIAGNOSIS — Z7982 Long term (current) use of aspirin: Secondary | ICD-10-CM | POA: Insufficient documentation

## 2023-06-20 DIAGNOSIS — Z7951 Long term (current) use of inhaled steroids: Secondary | ICD-10-CM | POA: Diagnosis not present

## 2023-06-20 DIAGNOSIS — I4510 Unspecified right bundle-branch block: Secondary | ICD-10-CM | POA: Diagnosis not present

## 2023-06-20 DIAGNOSIS — I10 Essential (primary) hypertension: Secondary | ICD-10-CM | POA: Diagnosis not present

## 2023-06-20 DIAGNOSIS — Z833 Family history of diabetes mellitus: Secondary | ICD-10-CM | POA: Diagnosis not present

## 2023-06-20 HISTORY — PX: LOWER EXTREMITY INTERVENTION: CATH118252

## 2023-06-20 HISTORY — PX: ABDOMINAL AORTOGRAM W/LOWER EXTREMITY: CATH118223

## 2023-06-20 LAB — POCT I-STAT, CHEM 8
BUN: 17 mg/dL (ref 8–23)
Calcium, Ion: 1.21 mmol/L (ref 1.15–1.40)
Chloride: 100 mmol/L (ref 98–111)
Creatinine, Ser: 1 mg/dL (ref 0.61–1.24)
Glucose, Bld: 104 mg/dL — ABNORMAL HIGH (ref 70–99)
HCT: 35 % — ABNORMAL LOW (ref 39.0–52.0)
Hemoglobin: 11.9 g/dL — ABNORMAL LOW (ref 13.0–17.0)
Potassium: 3.9 mmol/L (ref 3.5–5.1)
Sodium: 138 mmol/L (ref 135–145)
TCO2: 27 mmol/L (ref 22–32)

## 2023-06-20 LAB — GLUCOSE, CAPILLARY: Glucose-Capillary: 83 mg/dL (ref 70–99)

## 2023-06-20 SURGERY — ABDOMINAL AORTOGRAM W/LOWER EXTREMITY
Anesthesia: LOCAL | Laterality: Left

## 2023-06-20 MED ORDER — ONDANSETRON HCL 4 MG/2ML IJ SOLN: 4.0000 mg | Freq: Four times a day (QID) | INTRAMUSCULAR | Status: DC | PRN

## 2023-06-20 MED ORDER — MIDAZOLAM HCL 2 MG/2ML IJ SOLN
INTRAMUSCULAR | Status: AC
  Filled 2023-06-20: qty 2

## 2023-06-20 MED ORDER — FENTANYL CITRATE (PF) 100 MCG/2ML IJ SOLN
INTRAMUSCULAR | Status: DC | PRN
  Administered 2023-06-20: 50 ug via INTRAVENOUS

## 2023-06-20 MED ORDER — SODIUM CHLORIDE 0.9% FLUSH: 3.0000 mL | INTRAVENOUS | Status: DC | PRN

## 2023-06-20 MED ORDER — HEPARIN (PORCINE) IN NACL 1000-0.9 UT/500ML-% IV SOLN
INTRAVENOUS | Status: DC | PRN
  Administered 2023-06-20 (×2): 500 mL

## 2023-06-20 MED ORDER — MIDAZOLAM HCL 2 MG/2ML IJ SOLN
INTRAMUSCULAR | Status: DC | PRN
  Administered 2023-06-20: 1 mg via INTRAVENOUS

## 2023-06-20 MED ORDER — SODIUM CHLORIDE 0.9 % IV SOLN: INTRAVENOUS | Status: DC

## 2023-06-20 MED ORDER — FENTANYL CITRATE (PF) 100 MCG/2ML IJ SOLN
INTRAMUSCULAR | Status: AC
  Filled 2023-06-20: qty 2

## 2023-06-20 MED ORDER — LABETALOL HCL 5 MG/ML IV SOLN: 10.0000 mg | INTRAVENOUS | Status: DC | PRN

## 2023-06-20 MED ORDER — SODIUM CHLORIDE 0.9 % WEIGHT BASED INFUSION: 1.0000 mL/kg/h | INTRAVENOUS | Status: DC

## 2023-06-20 MED ORDER — SODIUM CHLORIDE 0.9 % IV SOLN: 250.0000 mL | INTRAVENOUS | Status: DC | PRN

## 2023-06-20 MED ORDER — IODIXANOL 320 MG/ML IV SOLN
INTRAVENOUS | Status: DC | PRN
  Administered 2023-06-20: 75 mL

## 2023-06-20 MED ORDER — LIDOCAINE HCL (PF) 1 % IJ SOLN
INTRAMUSCULAR | Status: AC
  Filled 2023-06-20: qty 30

## 2023-06-20 MED ORDER — SODIUM CHLORIDE 0.9% FLUSH: 3.0000 mL | Freq: Two times a day (BID) | INTRAVENOUS | Status: DC

## 2023-06-20 MED ORDER — HEPARIN SODIUM (PORCINE) 1000 UNIT/ML IJ SOLN
INTRAMUSCULAR | Status: AC
Start: 2023-06-20 — End: ?
  Filled 2023-06-20: qty 10

## 2023-06-20 MED ORDER — HYDRALAZINE HCL 20 MG/ML IJ SOLN: 5.0000 mg | INTRAMUSCULAR | Status: DC | PRN

## 2023-06-20 MED ORDER — LIDOCAINE HCL (PF) 1 % IJ SOLN
INTRAMUSCULAR | Status: DC | PRN
  Administered 2023-06-20: 15 mL

## 2023-06-20 MED ORDER — ACETAMINOPHEN 325 MG PO TABS: 650.0000 mg | ORAL_TABLET | ORAL | Status: DC | PRN

## 2023-06-20 MED ORDER — HEPARIN SODIUM (PORCINE) 1000 UNIT/ML IJ SOLN
INTRAMUSCULAR | Status: DC | PRN
  Administered 2023-06-20: 8000 [IU] via INTRAVENOUS

## 2023-06-20 SURGICAL SUPPLY — 18 items
BALLN IN.PACT DCB 6X40 (BALLOONS) ×1 IMPLANT
CATH OMNI FLUSH 5F 65CM (CATHETERS) IMPLANT
CATH QUICKCROSS .035X135CM (MICROCATHETER) IMPLANT
CATH QUICKCROSS SUPP .035X90CM (MICROCATHETER) IMPLANT
CLOSURE MYNX CONTROL 6F/7F (Vascular Products) IMPLANT
DCB IN.PACT 6X40 (BALLOONS) IMPLANT
GLIDEWIRE ADV .035X260CM (WIRE) IMPLANT
KIT ENCORE 26 ADVANTAGE (KITS) IMPLANT
KIT MICROPUNCTURE NIT STIFF (SHEATH) IMPLANT
KIT SINGLE USE MANIFOLD (KITS) IMPLANT
KIT SYRINGE INJ CVI SPIKEX1 (MISCELLANEOUS) IMPLANT
PACK CARDIAC CATHETERIZATION (CUSTOM PROCEDURE TRAY) IMPLANT
SET ATX-X65L (MISCELLANEOUS) IMPLANT
SHEATH CATAPULT 6F 45 MP (SHEATH) IMPLANT
SHEATH PINNACLE 5F 10CM (SHEATH) IMPLANT
SHEATH PINNACLE 6F 10CM (SHEATH) IMPLANT
SHEATH PROBE COVER 6X72 (BAG) IMPLANT
WIRE BENTSON .035X145CM (WIRE) IMPLANT

## 2023-06-20 NOTE — Discharge Instructions (Signed)
 Femoral Site Care This sheet gives you information about how to care for yourself after your procedure. Your health care provider may also give you more specific instructions. If you have problems or questions, contact your health care provider. What can I expect after the procedure?  After the procedure, it is common to have: Bruising that usually fades within 1-2 weeks. Tenderness at the site. Follow these instructions at home: Wound care Follow instructions from your health care provider about how to take care of your insertion site. Make sure you: Wash your hands with soap and water before you change your bandage (dressing). If soap and water are not available, use hand sanitizer. Remove your dressing as told by your health care provider. In 24 hours Do not take baths, swim, or use a hot tub until your health care provider approves. You may shower 24-48 hours after the procedure or as told by your health care provider. Gently wash the site with plain soap and water. Pat the area dry with a clean towel. Do not rub the site. This may cause bleeding. Do not apply powder or lotion to the site. Keep the site clean and dry. Check your femoral site every day for signs of infection. Check for: Redness, swelling, or pain. Fluid or blood. Warmth. Pus or a bad smell. Activity For the first 2-3 days after your procedure, or as long as directed: Avoid climbing stairs as much as possible. Do not squat. Do not lift anything that is heavier than 10 lb (4.5 kg), or the limit that you are told, until your health care provider says that it is safe. For 5 days Rest as directed. Avoid sitting for a long time without moving. Get up to take short walks every 1-2 hours. Do not drive for 24 hours if you were given a medicine to help you relax (sedative). General instructions Take over-the-counter and prescription medicines only as told by your health care provider. Keep all follow-up visits as told by  your health care provider. This is important. Contact a health care provider if you have: A fever or chills. You have redness, swelling, or pain around your insertion site. Get help right away if: The catheter insertion area swells very fast. You pass out. You suddenly start to sweat or your skin gets clammy. The catheter insertion area is bleeding, and the bleeding does not stop when you hold steady pressure on the area. The area near or just beyond the catheter insertion site becomes pale, cool, tingly, or numb. These symptoms may represent a serious problem that is an emergency. Do not wait to see if the symptoms will go away. Get medical help right away. Call your local emergency services (911 in the [place] Do not drive yourself to the hospital. Summary After the procedure, it is common to have bruising that usually fades within 1-2 weeks. Check your femoral site every day for signs of infection. Do not lift anything that is heavier than 10 lb (4.5 kg), or the limit that you are told, until your health care provider says that it is safe. This information is not intended to replace advice given to you by your health care provider. Make sure you discuss any questions you have with your health care provider. Document Revised: 04/11/2017 Document Reviewed: 04/11/2017 Elsevier Patient Education  2020 [employer].

## 2023-06-20 NOTE — Progress Notes (Signed)
 Patient walked to the bathroom without difficulties. Right groin level 0, clean, dry, and intact.

## 2023-06-20 NOTE — Interval H&P Note (Signed)
 History and Physical Interval Note:  06/20/2023 7:21 AM  Randy Jacobson  has presented today for surgery, with the diagnosis of peripheral artery disease.  The various methods of treatment have been discussed with the patient and family. After consideration of risks, benefits and other options for treatment, the patient has consented to  Procedure(s): ABDOMINAL AORTOGRAM W/LOWER EXTREMITY (N/A) as a surgical intervention.  The patient's history has been reviewed, patient examined, no change in status, stable for surgery.  I have reviewed the patient's chart and labs.  Questions were answered to the patient's satisfaction.     Daria Pastures

## 2023-06-20 NOTE — Op Note (Signed)
 Patient name: Randy Jacobson MRN: [id number] DOB: [birth year] Sex: male  06/20/2023 Pre-operative Diagnosis: PAD with threatened left femoral popliteal bypass with rGSV Post-operative diagnosis:  Same Surgeon:  Daria Pastures, MD Procedure Performed:  Ultrasound-guided access of right common femoral artery Aortogram and bilateral lower extremity angiogram Third order cannulation of left popliteal artery Drug-coated balloon angioplasty of left femoral-popliteal bypass with 6 x 40 Impact Mynx closure of right common femoral artery 39 minutes moderate sedation with Versed and fentanyl  Indications:  Patient is a 78 year old male with PAD and asymptomatic carotid stenosis.  He was seen in clinic for follow-up and was noted to have a drop in his ABI as well as a duplex demonstrating severe stenosis in the distal aspect of the bypass.  Risks and benefits of angiogram with intervention in order to maintain bypass patency was discussed, he expressed understanding and elected to proceed.  Findings:  Widely patent aorta and bilateral renal arteries.  Widely patent common iliac stents and left external iliac stent.  Left common femoral and profunda widely patent.  CTO of left SFA.  Left femoropopliteal bypass with severe approximate 80% stenosis in the distal aspect near the distal anastomosis.  One-vessel runoff via the AT.  Right common femoral and profunda widely patent.  Significantly calcified CTO of the right SFA with reconstitution of P1.  Widely patent popliteal artery.  The AT occluded shortly after its takeoff.  The PT is chronically occluded.  There is one-vessel runoff via the peroneal which reconstitutes the distal AT.Marland Kitchen   Procedure:  The patient was identified in the holding area and taken to the cath lab  The patient was then placed supine on the table and prepped and draped in the usual sterile fashion.  A time out was called.  Ultrasound was used to evaluate the right common femoral  artery.  It was patent .  A digital ultrasound image was acquired.  A micropuncture needle was used to access the right common femoral artery under ultrasound guidance.  An 018 wire was advanced without resistance and a micropuncture sheath was placed.  The 018 wire was removed and a benson wire was placed.  The micropuncture sheath was exchanged for a 5 french sheath.  An omniflush catheter was advanced over the wire to the level of L-1.  An abdominal angiogram was obtained.  Next, using the omniflush catheter and a glide advantage wire, the aortic bifurcation was crossed and the catheter was placed into theleft external iliac artery and left runoff was obtained. This demonstrated the above findings.  The glide advantage wire was replaced through the catheter and into the bypass.  The short 5 French sheath was then exchanged for a 6 [place] by 45 cm catapult sheath and the patient was systemically heparinized.  The lesion was then crossed with the glide advantage wire and a quick cross catheter and then treated with a 6 x 40 Impact DCB.  Completion angiography demonstrated an excellent result with less than 30% residual stenosis and preserved runoff.  The balloon was removed and the long 6 French sheath was exchanged for a short 6 French sheath and right runoff was performed via retrograde sheath injections which demonstrated the above findings.  Contrast: 75cc  Sedation: 39 minutes   Impression: Excellent response to drug-coated balloon angioplasty of the left leg bypass stenosis. Calcified CTO of the right SFA with reconstitution of P1 and one-vessel runoff via the peroneal.   Daria Pastures MD [facility] [place] Office: [phone]

## 2023-06-21 ENCOUNTER — Telehealth: Payer: Self-pay | Admitting: Adult Health

## 2023-06-21 ENCOUNTER — Encounter (HOSPITAL_COMMUNITY): Payer: Self-pay | Admitting: Vascular Surgery

## 2023-06-21 NOTE — Telephone Encounter (Signed)
 Patient brought in a form from his pharmacy that need to be completed and signed by provider for medication assistance. Given to [facility] in [place]

## 2023-06-23 ENCOUNTER — Encounter (HOSPITAL_COMMUNITY)
Admission: EM | Disposition: A | Discharge status: Home / Self Care | Payer: Self-pay | Source: Home / Self Care | Attending: Vascular Surgery

## 2023-06-23 ENCOUNTER — Ambulatory Visit (INDEPENDENT_AMBULATORY_CARE_PROVIDER_SITE_OTHER): Admitting: Physician Assistant

## 2023-06-23 ENCOUNTER — Telehealth: Payer: Self-pay

## 2023-06-23 ENCOUNTER — Ambulatory Visit (INDEPENDENT_AMBULATORY_CARE_PROVIDER_SITE_OTHER)
Admission: RE | Admit: 2023-06-23 | Discharge: 2023-06-23 | Disposition: A | Discharge status: Home / Self Care | Source: Ambulatory Visit | Attending: Vascular Surgery | Admitting: Vascular Surgery

## 2023-06-23 ENCOUNTER — Other Ambulatory Visit: Payer: Self-pay

## 2023-06-23 ENCOUNTER — Emergency Department (HOSPITAL_COMMUNITY): Admitting: Certified Registered Nurse Anesthetist

## 2023-06-23 ENCOUNTER — Emergency Department (HOSPITAL_COMMUNITY)

## 2023-06-23 ENCOUNTER — Inpatient Hospital Stay (HOSPITAL_COMMUNITY)
Admission: EM | Admit: 2023-06-23 | Discharge: 2023-06-25 | DRG: 271 | Disposition: A | Discharge status: Home / Self Care | Attending: Vascular Surgery | Admitting: Vascular Surgery

## 2023-06-23 ENCOUNTER — Encounter (HOSPITAL_COMMUNITY): Payer: Self-pay

## 2023-06-23 ENCOUNTER — Encounter: Payer: Self-pay | Admitting: Physician Assistant

## 2023-06-23 VITALS — BP 172/74 | HR 81 | Temp 98.5°F | Ht 71.0 in | Wt 185.4 lb

## 2023-06-23 DIAGNOSIS — Z87891 Personal history of nicotine dependence: Secondary | ICD-10-CM | POA: Diagnosis not present

## 2023-06-23 DIAGNOSIS — I998 Other disorder of circulatory system: Secondary | ICD-10-CM

## 2023-06-23 DIAGNOSIS — I70221 Atherosclerosis of native arteries of extremities with rest pain, right leg: Secondary | ICD-10-CM

## 2023-06-23 DIAGNOSIS — Z7982 Long term (current) use of aspirin: Secondary | ICD-10-CM | POA: Diagnosis not present

## 2023-06-23 DIAGNOSIS — Z7902 Long term (current) use of antithrombotics/antiplatelets: Secondary | ICD-10-CM

## 2023-06-23 DIAGNOSIS — Z860101 Personal history of adenomatous and serrated colon polyps: Secondary | ICD-10-CM | POA: Diagnosis not present

## 2023-06-23 DIAGNOSIS — M79604 Pain in right leg: Secondary | ICD-10-CM | POA: Diagnosis not present

## 2023-06-23 DIAGNOSIS — I10 Essential (primary) hypertension: Secondary | ICD-10-CM | POA: Diagnosis not present

## 2023-06-23 DIAGNOSIS — E1151 Type 2 diabetes mellitus with diabetic peripheral angiopathy without gangrene: Secondary | ICD-10-CM | POA: Diagnosis present

## 2023-06-23 DIAGNOSIS — D62 Acute posthemorrhagic anemia: Secondary | ICD-10-CM | POA: Diagnosis not present

## 2023-06-23 DIAGNOSIS — I739 Peripheral vascular disease, unspecified: Secondary | ICD-10-CM | POA: Insufficient documentation

## 2023-06-23 DIAGNOSIS — I70401 Unspecified atherosclerosis of autologous vein bypass graft(s) of the extremities, right leg: Secondary | ICD-10-CM | POA: Diagnosis present

## 2023-06-23 DIAGNOSIS — I4510 Unspecified right bundle-branch block: Secondary | ICD-10-CM | POA: Diagnosis present

## 2023-06-23 DIAGNOSIS — I70201 Unspecified atherosclerosis of native arteries of extremities, right leg: Secondary | ICD-10-CM | POA: Diagnosis not present

## 2023-06-23 DIAGNOSIS — Z7951 Long term (current) use of inhaled steroids: Secondary | ICD-10-CM | POA: Diagnosis not present

## 2023-06-23 DIAGNOSIS — I6521 Occlusion and stenosis of right carotid artery: Secondary | ICD-10-CM | POA: Diagnosis present

## 2023-06-23 DIAGNOSIS — Z79899 Other long term (current) drug therapy: Secondary | ICD-10-CM

## 2023-06-23 DIAGNOSIS — Z794 Long term (current) use of insulin: Secondary | ICD-10-CM | POA: Diagnosis not present

## 2023-06-23 DIAGNOSIS — E119 Type 2 diabetes mellitus without complications: Secondary | ICD-10-CM | POA: Diagnosis not present

## 2023-06-23 DIAGNOSIS — Z833 Family history of diabetes mellitus: Secondary | ICD-10-CM

## 2023-06-23 DIAGNOSIS — E785 Hyperlipidemia, unspecified: Secondary | ICD-10-CM | POA: Diagnosis present

## 2023-06-23 DIAGNOSIS — Z9889 Other specified postprocedural states: Secondary | ICD-10-CM

## 2023-06-23 DIAGNOSIS — Z7984 Long term (current) use of oral hypoglycemic drugs: Secondary | ICD-10-CM

## 2023-06-23 HISTORY — PX: ENDARTERECTOMY FEMORAL: SHX5804

## 2023-06-23 HISTORY — PX: LOWER EXTREMITY ANGIOGRAM: SHX5508

## 2023-06-23 HISTORY — PX: INSERTION OF ILIAC STENT: SHX6256

## 2023-06-23 HISTORY — PX: PATCH ANGIOPLASTY: SHX6230

## 2023-06-23 LAB — COMPREHENSIVE METABOLIC PANEL
ALT: 22 U/L (ref 0–44)
AST: 32 U/L (ref 15–41)
Albumin: 4.4 g/dL (ref 3.5–5.0)
Alkaline Phosphatase: 72 U/L (ref 38–126)
Anion gap: 17 — ABNORMAL HIGH (ref 5–15)
BUN: 14 mg/dL (ref 8–23)
CO2: 20 mmol/L — ABNORMAL LOW (ref 22–32)
Calcium: 9.6 mg/dL (ref 8.9–10.3)
Chloride: 100 mmol/L (ref 98–111)
Creatinine, Ser: 0.81 mg/dL (ref 0.61–1.24)
GFR, Estimated: >60 mL/min (ref >60)
Glucose, Bld: 81 mg/dL (ref 70–99)
Potassium: 4.3 mmol/L (ref 3.5–5.1)
Sodium: 137 mmol/L (ref 135–145)
Total Bilirubin: 0.7 mg/dL (ref 0.0–1.2)
Total Protein: 7.5 g/dL (ref 6.5–8.1)

## 2023-06-23 LAB — POCT I-STAT 7, (LYTES, BLD GAS, ICA,H+H)
Acid-Base Excess: 0 mmol/L (ref 0.0–2.0)
Acid-base deficit: 1 mmol/L (ref 0.0–2.0)
Bicarbonate: 25 mmol/L (ref 20.0–28.0)
Bicarbonate: 24.2 mmol/L (ref 20.0–28.0)
Calcium, Ion: 1.16 mmol/L (ref 1.15–1.40)
Calcium, Ion: 1.13 mmol/L — ABNORMAL LOW (ref 1.15–1.40)
HCT: 29 % — ABNORMAL LOW (ref 39.0–52.0)
HCT: 22 % — ABNORMAL LOW (ref 39.0–52.0)
Hemoglobin: 9.9 g/dL — ABNORMAL LOW (ref 13.0–17.0)
Hemoglobin: 7.5 g/dL — ABNORMAL LOW (ref 13.0–17.0)
O2 Saturation: 100 %
O2 Saturation: 100 %
Patient temperature: 36.9
Patient temperature: 37
Potassium: 4.1 mmol/L (ref 3.5–5.1)
Potassium: 4.3 mmol/L (ref 3.5–5.1)
Sodium: 139 mmol/L (ref 135–145)
Sodium: 138 mmol/L (ref 135–145)
TCO2: 26 mmol/L (ref 22–32)
TCO2: 25 mmol/L (ref 22–32)
pCO2 arterial: 42.3 mmHg (ref 32–48)
pCO2 arterial: 41.8 mmHg (ref 32–48)
pH, Arterial: 7.38 (ref 7.35–7.45)
pH, Arterial: 7.371 (ref 7.35–7.45)
pO2, Arterial: 197 mmHg — ABNORMAL HIGH (ref 83–108)
pO2, Arterial: 202 mmHg — ABNORMAL HIGH (ref 83–108)

## 2023-06-23 LAB — GLUCOSE, CAPILLARY
Glucose-Capillary: 80 mg/dL (ref 70–99)
Glucose-Capillary: 187 mg/dL — ABNORMAL HIGH (ref 70–99)

## 2023-06-23 LAB — CBC WITH DIFFERENTIAL/PLATELET
Abs Immature Granulocytes: 0.06 10*3/uL (ref 0.00–0.07)
Basophils Absolute: 0.1 10*3/uL (ref 0.0–0.1)
Basophils Relative: 1 %
Eosinophils Absolute: 0.2 10*3/uL (ref 0.0–0.5)
Eosinophils Relative: 2 %
HCT: 35.7 % — ABNORMAL LOW (ref 39.0–52.0)
Hemoglobin: 11.4 g/dL — ABNORMAL LOW (ref 13.0–17.0)
Immature Granulocytes: 1 %
Lymphocytes Relative: 16 %
Lymphs Abs: 1.7 10*3/uL (ref 0.7–4.0)
MCH: 29.1 pg (ref 26.0–34.0)
MCHC: 31.9 g/dL (ref 30.0–36.0)
MCV: 91.1 fL (ref 80.0–100.0)
Monocytes Absolute: 1.6 10*3/uL — ABNORMAL HIGH (ref 0.1–1.0)
Monocytes Relative: 15 %
Neutro Abs: 7.3 10*3/uL (ref 1.7–7.7)
Neutrophils Relative %: 65 %
Platelets: 282 10*3/uL (ref 150–400)
RBC: 3.92 MIL/uL — ABNORMAL LOW (ref 4.22–5.81)
RDW: 22.9 % — ABNORMAL HIGH (ref 11.5–15.5)
WBC: 11 10*3/uL — ABNORMAL HIGH (ref 4.0–10.5)
nRBC: 0 % (ref 0.0–0.2)

## 2023-06-23 LAB — VAS US ABI WITH/WO TBI
Left ABI: 0.98
Right ABI: 0

## 2023-06-23 LAB — POCT ACTIVATED CLOTTING TIME
Activated Clotting Time: 204 s
Activated Clotting Time: 187 s

## 2023-06-23 LAB — SURGICAL PCR SCREEN
MRSA, PCR: NEGATIVE
Staphylococcus aureus: NEGATIVE

## 2023-06-23 LAB — PREPARE RBC (CROSSMATCH)

## 2023-06-23 SURGERY — ANGIOGRAM, LOWER EXTREMITY
Anesthesia: General | Site: Pelvis | Laterality: Right

## 2023-06-23 MED ORDER — LACTATED RINGERS IV SOLN: INTRAVENOUS | Status: DC

## 2023-06-23 MED ORDER — EPHEDRINE 5 MG/ML INJ
INTRAVENOUS | Status: AC
  Filled 2023-06-23: qty 5

## 2023-06-23 MED ORDER — PROPOFOL 10 MG/ML IV BOLUS
INTRAVENOUS | Status: DC | PRN
  Administered 2023-06-23: 150 mg via INTRAVENOUS
  Administered 2023-06-23: 20 mg via INTRAVENOUS

## 2023-06-23 MED ORDER — FENTANYL CITRATE (PF) 250 MCG/5ML IJ SOLN
INTRAMUSCULAR | Status: DC | PRN
  Administered 2023-06-23: 25 ug via INTRAVENOUS
  Administered 2023-06-23 (×2): 50 ug via INTRAVENOUS

## 2023-06-23 MED ORDER — SODIUM CHLORIDE 0.9 % IV SOLN: 10.0000 mL/h | Freq: Once | INTRAVENOUS | Status: DC

## 2023-06-23 MED ORDER — POTASSIUM CHLORIDE CRYS ER 20 MEQ PO TBCR: 20.0000 meq | EXTENDED_RELEASE_TABLET | Freq: Every day | ORAL | Status: DC | PRN

## 2023-06-23 MED ORDER — GUAIFENESIN-DM 100-10 MG/5ML PO SYRP: 15.0000 mL | ORAL_SOLUTION | ORAL | Status: DC | PRN

## 2023-06-23 MED ORDER — ONDANSETRON HCL 4 MG/2ML IJ SOLN
INTRAMUSCULAR | Status: AC
  Filled 2023-06-23: qty 2

## 2023-06-23 MED ORDER — ROCURONIUM BROMIDE 10 MG/ML (PF) SYRINGE
PREFILLED_SYRINGE | INTRAVENOUS | Status: DC | PRN
  Administered 2023-06-23: 40 mg via INTRAVENOUS
  Administered 2023-06-23: 60 mg via INTRAVENOUS

## 2023-06-23 MED ORDER — DOCUSATE SODIUM 100 MG PO CAPS
100.0000 mg | ORAL_CAPSULE | Freq: Every day | ORAL | Status: DC
  Administered 2023-06-24 – 2023-06-25 (×2): 100 mg via ORAL
  Filled 2023-06-23 (×2): qty 1

## 2023-06-23 MED ORDER — ACETAMINOPHEN 650 MG RE SUPP: 325.0000 mg | RECTAL | Status: DC | PRN

## 2023-06-23 MED ORDER — VASOPRESSIN 20 UNIT/ML IV SOLN
INTRAVENOUS | Status: DC | PRN
  Administered 2023-06-23: 1 [IU] via INTRAVENOUS

## 2023-06-23 MED ORDER — ASPIRIN 81 MG PO TBEC
81.0000 mg | DELAYED_RELEASE_TABLET | Freq: Every day | ORAL | Status: DC
  Administered 2023-06-24 – 2023-06-25 (×2): 81 mg via ORAL
  Filled 2023-06-23 (×2): qty 1

## 2023-06-23 MED ORDER — CHLORHEXIDINE GLUCONATE 0.12 % MT SOLN
15.0000 mL | Freq: Once | OROMUCOSAL | Status: AC
  Administered 2023-06-23: 15 mL via OROMUCOSAL
  Filled 2023-06-23 (×2): qty 15

## 2023-06-23 MED ORDER — HEPARIN 6000 UNIT IRRIGATION SOLUTION
Status: DC | PRN
  Administered 2023-06-23: 1

## 2023-06-23 MED ORDER — ACETAMINOPHEN 500 MG PO TABS: 1000.0000 mg | ORAL_TABLET | Freq: Once | ORAL | Status: DC | PRN

## 2023-06-23 MED ORDER — INSULIN ASPART 100 UNIT/ML IJ SOLN
0.0000 [IU] | Freq: Three times a day (TID) | INTRAMUSCULAR | Status: DC
  Administered 2023-06-24: 5 [IU] via SUBCUTANEOUS
  Administered 2023-06-24: 3 [IU] via SUBCUTANEOUS
  Administered 2023-06-25: 5 [IU] via SUBCUTANEOUS

## 2023-06-23 MED ORDER — HEPARIN SODIUM (PORCINE) 5000 UNIT/ML IJ SOLN
5000.0000 [IU] | Freq: Three times a day (TID) | INTRAMUSCULAR | Status: DC
Start: 2023-06-24 — End: 2023-06-25
  Administered 2023-06-24 – 2023-06-25 (×3): 5000 [IU] via SUBCUTANEOUS
  Filled 2023-06-23 (×3): qty 1

## 2023-06-23 MED ORDER — ATORVASTATIN CALCIUM 40 MG PO TABS
40.0000 mg | ORAL_TABLET | Freq: Every day | ORAL | Status: DC
  Administered 2023-06-24 – 2023-06-25 (×2): 40 mg via ORAL
  Filled 2023-06-23 (×2): qty 1

## 2023-06-23 MED ORDER — SODIUM CHLORIDE 0.9% IV SOLUTION: Freq: Once | INTRAVENOUS | Status: DC

## 2023-06-23 MED ORDER — HYDRALAZINE HCL 20 MG/ML IJ SOLN: 5.0000 mg | INTRAMUSCULAR | Status: DC | PRN

## 2023-06-23 MED ORDER — CEFAZOLIN SODIUM-DEXTROSE 2-4 GM/100ML-% IV SOLN
2.0000 g | Freq: Three times a day (TID) | INTRAVENOUS | Status: AC
  Administered 2023-06-23 – 2023-06-24 (×2): 2 g via INTRAVENOUS
  Filled 2023-06-23 (×2): qty 100

## 2023-06-23 MED ORDER — PROTAMINE SULFATE 10 MG/ML IV SOLN
INTRAVENOUS | Status: DC | PRN
  Administered 2023-06-23: 40 mg via INTRAVENOUS

## 2023-06-23 MED ORDER — SUGAMMADEX SODIUM 200 MG/2ML IV SOLN
INTRAVENOUS | Status: DC | PRN
  Administered 2023-06-23: 160 mg via INTRAVENOUS

## 2023-06-23 MED ORDER — 0.9 % SODIUM CHLORIDE (POUR BTL) OPTIME
TOPICAL | Status: DC | PRN
  Administered 2023-06-23: 2000 mL

## 2023-06-23 MED ORDER — LIDOCAINE 2% (20 MG/ML) 5 ML SYRINGE
INTRAMUSCULAR | Status: AC
  Filled 2023-06-23: qty 5

## 2023-06-23 MED ORDER — HEPARIN 6000 UNIT IRRIGATION SOLUTION
Status: AC
  Filled 2023-06-23: qty 500

## 2023-06-23 MED ORDER — ALBUMIN HUMAN 5 % IV SOLN: INTRAVENOUS | Status: DC | PRN

## 2023-06-23 MED ORDER — OXYCODONE HCL 5 MG/5ML PO SOLN: 5.0000 mg | Freq: Once | ORAL | Status: DC | PRN

## 2023-06-23 MED ORDER — INSULIN DEGLUDEC 100 UNIT/ML ~~LOC~~ SOPN
38.0000 [IU] | PEN_INJECTOR | Freq: Every day | SUBCUTANEOUS | Status: DC
  Filled 2023-06-23: qty 3

## 2023-06-23 MED ORDER — LOSARTAN POTASSIUM 50 MG PO TABS
50.0000 mg | ORAL_TABLET | Freq: Two times a day (BID) | ORAL | Status: DC
  Administered 2023-06-24 – 2023-06-25 (×3): 50 mg via ORAL
  Filled 2023-06-23 (×3): qty 1

## 2023-06-23 MED ORDER — PANTOPRAZOLE SODIUM 40 MG PO TBEC
40.0000 mg | DELAYED_RELEASE_TABLET | Freq: Every day | ORAL | Status: DC
  Administered 2023-06-24 – 2023-06-25 (×2): 40 mg via ORAL
  Filled 2023-06-23 (×2): qty 1

## 2023-06-23 MED ORDER — CLOPIDOGREL BISULFATE 75 MG PO TABS
75.0000 mg | ORAL_TABLET | Freq: Every day | ORAL | Status: DC
  Administered 2023-06-24 – 2023-06-25 (×2): 75 mg via ORAL
  Filled 2023-06-23 (×2): qty 1

## 2023-06-23 MED ORDER — OXYCODONE HCL 5 MG PO TABS: 5.0000 mg | ORAL_TABLET | Freq: Once | ORAL | Status: DC | PRN

## 2023-06-23 MED ORDER — ACETAMINOPHEN 160 MG/5ML PO SOLN: 1000.0000 mg | Freq: Once | ORAL | Status: DC | PRN

## 2023-06-23 MED ORDER — HEPARIN SODIUM (PORCINE) 1000 UNIT/ML IJ SOLN
INTRAMUSCULAR | Status: DC | PRN
Start: 2023-06-23 — End: 2023-06-23
  Administered 2023-06-23: 8000 [IU] via INTRAVENOUS
  Administered 2023-06-23: 2000 [IU] via INTRAVENOUS

## 2023-06-23 MED ORDER — BISACODYL 5 MG PO TBEC: 5.0000 mg | DELAYED_RELEASE_TABLET | Freq: Every day | ORAL | Status: DC | PRN

## 2023-06-23 MED ORDER — VASOPRESSIN 20 UNIT/ML IV SOLN
INTRAVENOUS | Status: AC
  Filled 2023-06-23: qty 1

## 2023-06-23 MED ORDER — PHENOL 1.4 % MT LIQD: 1.0000 | OROMUCOSAL | Status: DC | PRN

## 2023-06-23 MED ORDER — PHENYLEPHRINE 80 MCG/ML (10ML) SYRINGE FOR IV PUSH (FOR BLOOD PRESSURE SUPPORT)
PREFILLED_SYRINGE | INTRAVENOUS | Status: DC | PRN
  Administered 2023-06-23 (×3): 40 ug via INTRAVENOUS
  Administered 2023-06-23 (×5): 80 ug via INTRAVENOUS
  Administered 2023-06-23: 40 ug via INTRAVENOUS
  Administered 2023-06-23 (×2): 80 ug via INTRAVENOUS
  Administered 2023-06-23: 40 ug via INTRAVENOUS
  Administered 2023-06-23: 80 ug via INTRAVENOUS

## 2023-06-23 MED ORDER — PHENYLEPHRINE HCL-NACL 20-0.9 MG/250ML-% IV SOLN
INTRAVENOUS | Status: DC | PRN
  Administered 2023-06-23: 50 ug/min via INTRAVENOUS

## 2023-06-23 MED ORDER — HYDROMORPHONE HCL 1 MG/ML IJ SOLN
0.5000 mg | INTRAMUSCULAR | Status: DC | PRN
  Administered 2023-06-24: 0.5 mg via INTRAVENOUS
  Filled 2023-06-23: qty 0.5

## 2023-06-23 MED ORDER — SENNOSIDES-DOCUSATE SODIUM 8.6-50 MG PO TABS: 1.0000 | ORAL_TABLET | Freq: Every evening | ORAL | Status: DC | PRN

## 2023-06-23 MED ORDER — ONDANSETRON HCL 4 MG/2ML IJ SOLN
INTRAMUSCULAR | Status: DC | PRN
  Administered 2023-06-23 (×2): 4 mg via INTRAVENOUS

## 2023-06-23 MED ORDER — INSULIN ASPART 100 UNIT/ML IJ SOLN: 0.0000 [IU] | INTRAMUSCULAR | Status: DC | PRN

## 2023-06-23 MED ORDER — ACETAMINOPHEN 325 MG PO TABS: 325.0000 mg | ORAL_TABLET | ORAL | Status: DC | PRN

## 2023-06-23 MED ORDER — METOPROLOL TARTRATE 5 MG/5ML IV SOLN: 2.0000 mg | INTRAVENOUS | Status: DC | PRN

## 2023-06-23 MED ORDER — SODIUM CHLORIDE 0.9 % IV SOLN: INTRAVENOUS | Status: AC

## 2023-06-23 MED ORDER — HEMOSTATIC AGENTS (NO CHARGE) OPTIME
TOPICAL | Status: DC | PRN
Start: 2023-06-23 — End: 2023-06-23
  Administered 2023-06-23: 3 via TOPICAL

## 2023-06-23 MED ORDER — SODIUM CHLORIDE 0.9 % IV SOLN: INTRAVENOUS | Status: DC | PRN

## 2023-06-23 MED ORDER — INSULIN GLARGINE 100 UNIT/ML ~~LOC~~ SOLN
38.0000 [IU] | Freq: Every day | SUBCUTANEOUS | Status: DC
  Administered 2023-06-24 – 2023-06-25 (×2): 38 [IU] via SUBCUTANEOUS
  Filled 2023-06-23 (×2): qty 0.38

## 2023-06-23 MED ORDER — VASOPRESSIN 20 UNITS/100 ML INFUSION FOR SHOCK
0.0000 [IU]/min | INTRAVENOUS | Status: AC
  Filled 2023-06-23: qty 100

## 2023-06-23 MED ORDER — FENTANYL CITRATE (PF) 100 MCG/2ML IJ SOLN: 25.0000 ug | INTRAMUSCULAR | Status: DC | PRN

## 2023-06-23 MED ORDER — OXYCODONE-ACETAMINOPHEN 5-325 MG PO TABS
1.0000 | ORAL_TABLET | ORAL | Status: DC | PRN
  Administered 2023-06-24 – 2023-06-25 (×3): 2 via ORAL
  Filled 2023-06-23 (×4): qty 2

## 2023-06-23 MED ORDER — SODIUM CHLORIDE 0.9 % IV SOLN: 500.0000 mL | Freq: Once | INTRAVENOUS | Status: DC | PRN

## 2023-06-23 MED ORDER — SODIUM CHLORIDE (PF) 0.9 % IJ SOLN
INTRAVENOUS | Status: DC | PRN
  Administered 2023-06-23: 45 mL via INTRAMUSCULAR

## 2023-06-23 MED ORDER — HEMOSTATIC AGENTS (NO CHARGE) OPTIME
TOPICAL | Status: DC | PRN
  Administered 2023-06-23: 1 via TOPICAL

## 2023-06-23 MED ORDER — EPHEDRINE SULFATE-NACL 50-0.9 MG/10ML-% IV SOSY
PREFILLED_SYRINGE | INTRAVENOUS | Status: DC | PRN
  Administered 2023-06-23: 5 mg via INTRAVENOUS
  Administered 2023-06-23: 10 mg via INTRAVENOUS
  Administered 2023-06-23 (×2): 5 mg via INTRAVENOUS

## 2023-06-23 MED ORDER — ACETAMINOPHEN 10 MG/ML IV SOLN: 1000.0000 mg | Freq: Once | INTRAVENOUS | Status: DC | PRN

## 2023-06-23 MED ORDER — LABETALOL HCL 5 MG/ML IV SOLN: 10.0000 mg | INTRAVENOUS | Status: DC | PRN

## 2023-06-23 MED ORDER — ALUM & MAG HYDROXIDE-SIMETH 200-200-20 MG/5ML PO SUSP: 15.0000 mL | ORAL | Status: DC | PRN

## 2023-06-23 MED ORDER — MAGNESIUM SULFATE 2 GM/50ML IV SOLN: 2.0000 g | Freq: Every day | INTRAVENOUS | Status: DC | PRN

## 2023-06-23 MED ORDER — ORAL CARE MOUTH RINSE: 15.0000 mL | Freq: Once | OROMUCOSAL | Status: AC

## 2023-06-23 MED ORDER — FENTANYL CITRATE (PF) 250 MCG/5ML IJ SOLN
INTRAMUSCULAR | Status: AC
  Filled 2023-06-23: qty 5

## 2023-06-23 MED ORDER — HYDROCHLOROTHIAZIDE 12.5 MG PO TABS
12.5000 mg | ORAL_TABLET | Freq: Every day | ORAL | Status: DC
  Administered 2023-06-24 – 2023-06-25 (×2): 12.5 mg via ORAL
  Filled 2023-06-23 (×2): qty 1

## 2023-06-23 MED ORDER — ONDANSETRON HCL 4 MG/2ML IJ SOLN: 4.0000 mg | Freq: Four times a day (QID) | INTRAMUSCULAR | Status: DC | PRN

## 2023-06-23 MED ORDER — PANTOPRAZOLE SODIUM 40 MG PO TBEC: 40.0000 mg | DELAYED_RELEASE_TABLET | Freq: Every day | ORAL | Status: DC

## 2023-06-23 SURGICAL SUPPLY — 80 items
BAG COUNTER SPONGE SURGICOUNT (BAG) ×3 IMPLANT
BAG SNAP BAND KOVER 36X36 (MISCELLANEOUS) ×3 IMPLANT
BALLN MUSTANG 7X60X75 (BALLOONS) ×3 IMPLANT
BALLOON MUSTANG 7X60X75 (BALLOONS) IMPLANT
BLADE SURG 11 STRL SS (BLADE) ×3 IMPLANT
CANISTER SUCT 3000ML PPV (MISCELLANEOUS) ×3 IMPLANT
CATH ANGIO 5F BER2 65CM (CATHETERS) IMPLANT
CATH BEACON 5 .035 65 KMP TIP (CATHETERS) IMPLANT
CATH EMB 4FR 80 (CATHETERS) IMPLANT
CATH EMB 5FR 80CM (CATHETERS) IMPLANT
CATH OMNI FLUSH .035X70CM (CATHETERS) IMPLANT
CHLORAPREP W/TINT 26 (MISCELLANEOUS) ×3 IMPLANT
CLIP TI MEDIUM 6 (CLIP) ×3 IMPLANT
CLIP TI WIDE RED SMALL 6 (CLIP) ×3 IMPLANT
COVER DOME SNAP 22 D (MISCELLANEOUS) ×3 IMPLANT
COVER PROBE W GEL 5X96 (DRAPES) ×3 IMPLANT
COVER SURGICAL LIGHT HANDLE (MISCELLANEOUS) ×3 IMPLANT
DERMABOND ADVANCED .7 DNX12 (GAUZE/BANDAGES/DRESSINGS) ×6 IMPLANT
DEVICE TORQUE KENDALL .025-038 (MISCELLANEOUS) IMPLANT
DRAIN CHANNEL 15F RND FF W/TCR (WOUND CARE) IMPLANT
DRAPE FEMORAL ANGIO 80X135IN (DRAPES) ×3 IMPLANT
DRAPE X-RAY CASS 24X20 (DRAPES) IMPLANT
ELECT REM PT RETURN 9FT ADLT (ELECTROSURGICAL) ×3 IMPLANT
ELECTRODE REM PT RTRN 9FT ADLT (ELECTROSURGICAL) ×3 IMPLANT
EVACUATOR SILICONE 100CC (DRAIN) IMPLANT
FELT TEFLON 1X6 (MISCELLANEOUS) IMPLANT
GAUZE 4X4 16PLY ~~LOC~~+RFID DBL (SPONGE) ×3 IMPLANT
GLIDEWIRE ADV .035X180CM (WIRE) IMPLANT
GLOVE BIO SURGEON STRL SZ8 (GLOVE) IMPLANT
GLOVE BIOGEL PI IND STRL 7.0 (GLOVE) ×3 IMPLANT
GLOVE BIOGEL PI IND STRL 7.5 (GLOVE) IMPLANT
GLOVE BIOGEL PI IND STRL 8 (GLOVE) IMPLANT
GLOVE SURG SS PI 7.5 STRL IVOR (GLOVE) IMPLANT
GOWN STRL REUS W/ TWL LRG LVL3 (GOWN DISPOSABLE) ×6 IMPLANT
GOWN STRL REUS W/ TWL XL LVL3 (GOWN DISPOSABLE) ×3 IMPLANT
GUIDEWIRE ANGLED .035X150CM (WIRE) IMPLANT
HEMOSTAT HEMOBLAST BELLOWS (HEMOSTASIS) IMPLANT
HEMOSTAT SNOW SURGICEL 2X4 (HEMOSTASIS) IMPLANT
KIT BASIN OR (CUSTOM PROCEDURE TRAY) ×3 IMPLANT
KIT ENCORE 26 ADVANTAGE (KITS) IMPLANT
KIT TURNOVER KIT B (KITS) ×3 IMPLANT
LOOP VESSEL MINI RED (MISCELLANEOUS) IMPLANT
NDL PERC 18GX7CM (NEEDLE) ×3 IMPLANT
NEEDLE PERC 18GX7CM (NEEDLE) ×3 IMPLANT
NS IRRIG 1000ML POUR BTL (IV SOLUTION) ×6 IMPLANT
PACK PERIPHERAL VASCULAR (CUSTOM PROCEDURE TRAY) ×3 IMPLANT
PACK SRG BSC III STRL LF ECLPS (CUSTOM PROCEDURE TRAY) ×3 IMPLANT
PAD ARMBOARD POSITIONER FOAM (MISCELLANEOUS) ×6 IMPLANT
PATCH VASC XENOSURE 1X6 (Vascular Products) IMPLANT
POWDER SURGICEL 3.0 GRAM (HEMOSTASIS) IMPLANT
PROTECTION STATION PRESSURIZED (MISCELLANEOUS) ×3 IMPLANT
SET COLLECT BLD 21X3/4 12 (NEEDLE) IMPLANT
SET MICROPUNCTURE 5F STIFF (MISCELLANEOUS) ×3 IMPLANT
SHEATH AVANTI 11CM 5FR (SHEATH) IMPLANT
SHEATH PINNACLE 5F 10CM (SHEATH) IMPLANT
SHEATH PINNACLE 6F 10CM (SHEATH) IMPLANT
STATION PROTECTION PRESSURIZED (MISCELLANEOUS) ×3 IMPLANT
STENT ZILVER PTX 8X60 (Permanent Stent) IMPLANT
STOPCOCK 4 WAY LG BORE MALE ST (IV SETS) IMPLANT
STOPCOCK MORSE 400PSI 3WAY (MISCELLANEOUS) ×3 IMPLANT
SUT ETHILON 3 0 PS 1 (SUTURE) IMPLANT
SUT MNCRL AB 4-0 PS2 18 (SUTURE) ×3 IMPLANT
SUT PROLENE 5 0 C 1 24 (SUTURE) IMPLANT
SUT PROLENE 6 0 BV (SUTURE) ×3 IMPLANT
SUT PROLENE 7 0 BV1 MDA (SUTURE) IMPLANT
SUT SILK 3-0 18XBRD TIE 12 (SUTURE) IMPLANT
SUT VIC AB 2-0 CT1 TAPERPNT 27 (SUTURE) ×3 IMPLANT
SUT VIC AB 3-0 SH 27X BRD (SUTURE) ×3 IMPLANT
SYR 10ML LL (SYRINGE) ×9 IMPLANT
SYR 20ML LL LF (SYRINGE) ×3 IMPLANT
SYR 30ML LL (SYRINGE) ×3 IMPLANT
SYR 3ML LL SCALE MARK (SYRINGE) IMPLANT
SYR MEDRAD MARK V 150ML (SYRINGE) IMPLANT
TOWEL GREEN STERILE (TOWEL DISPOSABLE) ×6 IMPLANT
TRAY FOLEY MTR SLVR 16FR STAT (SET/KITS/TRAYS/PACK) ×3 IMPLANT
TUBING EXTENTION W/L.L. (IV SETS) IMPLANT
TUBING HIGH PRESSURE 120CM (CONNECTOR) IMPLANT
UNDERPAD 30X36 HEAVY ABSORB (UNDERPADS AND DIAPERS) ×3 IMPLANT
WATER STERILE IRR 1000ML POUR (IV SOLUTION) ×3 IMPLANT
WIRE BENTSON .035X145CM (WIRE) ×3 IMPLANT

## 2023-06-23 NOTE — H&P (Addendum)
 Hospital Consult    Reason for Consult:  Right leg numbness Requesting Service/PhysicianRenae Fickle  MRN #:  [id number]  History of Present Illness: This is a 78 y.o. male who presented to the clinic for triage today due to acute right leg numbness and weakness.  He underwent angiogram with drug-coated balloon angioplasty of left femoral-popliteal artery bypass on Monday, 06/20/2023 for the indication of threatened bypass with severely elevated velocities at the distal anastomosis.  In the clinic he was evaluated by Dr. Sherral Hammers and he was noted to have a nonpalpable right common femoral artery with an ABI of 0.  He was recommended to go directly to the ED for heparin drip and possible surgical intervention. In the ED he reports that he began having some numbness yesterday although was not having significant pain.  This morning he went to get out of and his leg gave out.  He denies any motor deficit but does have some numbness.  He denies severe pain in the foot.  Past Medical History:  Diagnosis Date   Diabetes mellitus without complication (HCC)    Dysrhythmia    chronic RBBB; bradycardia s/p atropine following sheath removal, 3.8 second sinus pause 10/11/22   Hypertension    Peripheral artery disease [facility])     Past Surgical History:  Procedure Laterality Date   "stents in both my legs"     ABDOMINAL AORTOGRAM W/LOWER EXTREMITY N/A 10/11/2022   Procedure: ABDOMINAL AORTOGRAM W/LOWER EXTREMITY;  Surgeon: Chuck Hint, MD;  Location: [facility] INVASIVE CV LAB;  Service: Cardiovascular;  Laterality: N/A;   ABDOMINAL AORTOGRAM W/LOWER EXTREMITY Left 06/20/2023   Procedure: ABDOMINAL AORTOGRAM W/LOWER EXTREMITY;  Surgeon: Daria Pastures, MD;  Location: [facility] INVASIVE CV LAB;  Service: Cardiovascular;  Laterality: Left;   COLONOSCOPY WITH PROPOFOL N/A 01/18/2023   Procedure: COLONOSCOPY WITH PROPOFOL;  Surgeon: Kerin Salen, MD;  Location: [facility] ENDOSCOPY;  Service: Gastroenterology;  Laterality: N/A;    ENDARTERECTOMY FEMORAL Left 10/15/2022   Procedure: ENDARTERECTOMY OF LEFT ILIOFEMORAL ARTERY;  Surgeon: Chuck Hint, MD;  Location: [facility] OR;  Service: Vascular;  Laterality: Left;   ESOPHAGOGASTRODUODENOSCOPY (EGD) WITH PROPOFOL N/A 01/18/2023   Procedure: ESOPHAGOGASTRODUODENOSCOPY (EGD) WITH PROPOFOL;  Surgeon: Kerin Salen, MD;  Location: [facility] ENDOSCOPY;  Service: Gastroenterology;  Laterality: N/A;   FEMORAL-POPLITEAL BYPASS GRAFT Left 10/15/2022   Procedure: LEFT FEMORAL-BELOW KNEE POPLITEAL ARTERY BYPASS WITH VEIN HARVEST OF LEFT GREATER SAPHENOUS VEIN;  Surgeon: Chuck Hint, MD;  Location: [facility] OR;  Service: Vascular;  Laterality: Left;   GIVENS CAPSULE STUDY N/A 04/10/2017   Procedure: GIVENS CAPSULE STUDY;  Surgeon: Charlott Rakes, MD;  Location: [facility] ENDOSCOPY;  Service: Endoscopy;  Laterality: N/A;   GIVENS CAPSULE STUDY N/A 01/18/2023   Procedure: GIVENS CAPSULE STUDY;  Surgeon: Kerin Salen, MD;  Location: [facility] ENDOSCOPY;  Service: Gastroenterology;  Laterality: N/A;   IR GENERIC HISTORICAL  08/26/2015   IR RADIOLOGIST EVAL & MGMT 08/26/2015 Malachy Moan, MD [facility] INTERV RAD   IR GENERIC HISTORICAL  04/21/2016   IR RADIOLOGIST EVAL & MGMT 04/21/2016 Malachy Moan, MD [facility] INTERV RAD   LOWER EXTREMITY INTERVENTION Left 06/20/2023   Procedure: LOWER EXTREMITY INTERVENTION;  Surgeon: Daria Pastures, MD;  Location: [facility] INVASIVE CV LAB;  Service: Cardiovascular;  Laterality: Left;  DCB   PATCH ANGIOPLASTY Left 10/15/2022   Procedure: PATCH ANGIOPLASTY USING XENOSURE BIOLOGIC PATCH 1CMX14CM;  Surgeon: Chuck Hint, MD;  Location: [facility] OR;  Service: Vascular;  Laterality: Left;   PERIPHERAL VASCULAR INTERVENTION  10/11/2022   Procedure: PERIPHERAL VASCULAR INTERVENTION;  Surgeon: Chuck Hint, MD;  Location: [facility] INVASIVE CV LAB;  Service: Cardiovascular;;    No Known Allergies  Prior to Admission medications   Medication Sig Start Date End Date Taking?  Authorizing Provider  Ascorbic Acid (VITAMIN C) 1000 MG tablet Take 1 tablet (1,000 mg total) by mouth daily. 05/18/23   Medina-Vargas, Monina C, NP  aspirin EC 81 MG tablet Take 1 tablet (81 mg total) by mouth daily at 6 (six) AM. Swallow whole. 10/19/22   Schuh, McKenzi P, PA-C  atorvastatin (LIPITOR) 40 MG tablet Take 40 mg by mouth daily.    [provider]  clopidogrel (PLAVIX) 75 MG tablet Take 1 tablet (75 mg total) by mouth daily. 05/18/23   Medina-Vargas, Monina C, NP  Continuous Glucose Receiver (FREESTYLE LIBRE 2 READER) DEVI 1 Device by Does not apply route 3 (three) times daily. E11.69 04/18/23   Medina-Vargas, Monina C, NP  Continuous Glucose Sensor (FREESTYLE LIBRE 2 SENSOR) MISC 2 Devices by Does not apply route daily. E11.69 04/18/23   Medina-Vargas, Monina C, NP  Emollient (EUCERIN) lotion Apply 1 Application topically daily.    [provider]  ferrous sulfate 325 (65 FE) MG tablet Take 1 tablet (325 mg total) by mouth daily with breakfast. 01/20/23   Arnetha Courser, MD  hydrochlorothiazide (HYDRODIURIL) 25 MG tablet Take 0.5 tablets (12.5 mg total) by mouth daily. Patient taking differently: Take 25 mg by mouth daily. 04/26/23   Medina-Vargas, Monina C, NP  losartan (COZAAR) 50 MG tablet Take 50 mg by mouth in the morning and at bedtime.    [provider]  Melatonin 10 MG TABS Take 10 mg by mouth at bedtime.    [provider]  metFORMIN (GLUCOPHAGE) 1000 MG tablet Take 1 tablet (1,000 mg total) by mouth 2 (two) times daily with a meal. 03/29/23   Medina-Vargas, Monina C, NP  OVER THE COUNTER MEDICATION *Has a special pink pill from [place] that he takes as needed to lower BG readings when high- could not tell me the name during call today    [provider]  pantoprazole (PROTONIX) 40 MG tablet Take 40 mg by mouth daily. 09/02/21   [provider]  TRESIBA FLEXTOUCH 100 UNIT/ML FlexTouch Pen Inject 38 Units into the skin daily. 05/18/23    Medina-Vargas, Margit Banda, NP    Social History   Socioeconomic History   Marital status: Significant Other    Spouse name: Not on file   Number of children: Not on file   Years of education: Not on file   Highest education level: Not on file  Occupational History   Not on file  Tobacco Use   Smoking status: Former    Current packs/day: 0.00    Average packs/day: 1 pack/day for 36.0 years (36.0 ttl pk-yrs)    Types: Cigarettes    Start date: 11/20/1957    Quit date: 11/20/1993    Years since quitting: 29.6   Smokeless tobacco: Never  Vaping Use   Vaping status: Never Used  Substance and Sexual Activity   Alcohol use: Yes    Alcohol/week: 0.0 standard drinks of alcohol    Comment: occ   Drug use: Never   Sexual activity: Not on file  Other Topics Concern   Not on file  Social History Narrative   Not on file   Social Drivers of Health   Financial Resource Strain: Low Risk  (01/29/2022)   Received  from [facility], [facility]   Overall Financial Resource Strain (CARDIA)    Difficulty of Paying Living Expenses: Not hard at all  Food Insecurity: No Food Insecurity (05/23/2023)   Hunger Vital Sign    Worried About Running Out of Food in the Last Year: Never true    Ran Out of Food in the Last Year: Never true  Transportation Needs: No Transportation Needs (05/23/2023)   PRAPARE - Administrator, Civil Service (Medical): No    Lack of Transportation (Non-Medical): No  Physical Activity: Not on file  Stress: No Stress Concern Present (01/29/2022)   Received from [facility], [facility] of Occupational Health - Occupational Stress Questionnaire    Feeling of Stress : Not at all  Social Connections: Not on file  Intimate Partner Violence: Not At Risk (05/23/2023)   Humiliation, Afraid, Rape, and Kick questionnaire    Fear of Current or Ex-Partner: No    Emotionally Abused: No    Physically Abused: No    Sexually Abused: No     Family History  Problem Relation Age of Onset   Diabetes Brother     ROS: Otherwise negative unless mentioned in HPI  Physical Examination  Vitals:   06/23/23 1511  BP: (!) 146/68  Pulse: 89  Resp: 16  Temp: 98.3 F (36.8 C)  SpO2: 97%   Body mass index is 25.8 kg/m.  General: no acute distress Cardiac: hemodynamically stable Pulm: normal work of breathing Abdomen: non-tender, no pulsatile mass  Neuro: alert, no focal deficit Extremities: Right foot cooler compared to left.  Nonpalpable right femoral artery, absent pedal signals.  Motor intact, slight sensory deficit to light touch.  Data: ABI +---------+------------------+-----+--------+--------+  Right   Rt Pressure (mmHg)IndexWaveformComment   +---------+------------------+-----+--------+--------+  Brachial 183                                      +---------+------------------+-----+--------+--------+  ATA     0                 0.00                   +---------+------------------+-----+--------+--------+  PTA     0                 0.00                   +---------+------------------+-----+--------+--------+  Great Toe0                 0.00                   +---------+------------------+-----+--------+--------+   +---------+------------------+-----+--------+-------+  Left    Lt Pressure (mmHg)IndexWaveformComment  +---------+------------------+-----+--------+-------+  Brachial 176                                     +---------+------------------+-----+--------+-------+  ATA     179               0.98                  +---------+------------------+-----+--------+-------+  PTA     33                0.18                  +---------+------------------+-----+--------+-------+  Great Toe34                0.19                  +---------+------------------+-----+--------+-------+   +-------+-----------+-----------+------------+------------+   ABI/TBIToday's ABIToday's TBIPrevious ABIPrevious TBI  +-------+-----------+-----------+------------+------------+  Right 0          0          [place]          absent        +-------+-----------+-----------+------------+------------+  Left  0.98       0.19       0.88        0.23          +-------+-----------+-----------+------------+------------+   ASSESSMENT/PLAN: This is a 78 y.o. male with PAD presenting for [place] 2A acute limb ischemia due to access site/closure device complication.  He underwent angiogram with drug-coated balloon angioplasty of his left femoral to popliteal bypass on 06/20/2023. He has had slight numbness since yesterday but worsened this morning.  We discussed the risk and benefits of right femoral artery exposure with embolectomy and angiogram.  Explained the risks and left of bleeding, infection as well as compartment syndrome to the patient and his girlfriend.  Explained that I do not believe that fasciotomies are absolutely necessary, and he does not have a motor deficit although we will continue to monitor overnight as well as tomorrow morning.  He last ate about 9 AM this morning. He does not take blood thinners.  -To OR for right femoral artery exposure with embolectomy and right leg angiogram -Will be admitted postoperatively.   Daria Pastures MD [facility] [phone] 06/23/2023  3:42 PM'

## 2023-06-23 NOTE — Progress Notes (Signed)
 HISTORY AND PHYSICAL     CC:  follow up. Requesting Provider:  Gillis Santa*  HPI: This is a 78 y.o. male who is here today for follow up for PAD.  Pt has hx of  left iliofemoral endarterectomy with pericardial patch angioplasty, left femoral to below knee popliteal bypass with non reversed translocated vein for gangrenous left great toe on 10/15/2022 by Dr. Edilia Bo.   On 06/20/2023, he underwent angiogram with drug coated balloon angioplasty of left femoral to popliteal bypass by Dr. Hetty Blend for threatened bypass.    The pt returns today for follow up.  He called earlier stating that he is now having weakness in the right leg and his right foot feels cold.  He was scheduled for an urgent visit to be evaluated.    Pt states that last night he woke up with sudden pain in his right foot.  He states that when he got out of bed to go to the bathroom, his right leg give out and he fell.  He states that the right foot is numb.  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  Both asymptomatic.   He had discussion with Dr. Hetty Blend on a couple of occasions and pt wished to proceed with medical management and declined carotid intervention.    The pt is on a statin for cholesterol management.    The pt is on an aspirin.    Other AC:  Plavix The pt is on diuretic, ARB for hypertension.  The pt is  on medication for diabetes. Tobacco hx:  former    Past Medical History:  Diagnosis Date   Diabetes mellitus without complication (HCC)    Dysrhythmia    chronic RBBB; bradycardia s/p atropine following sheath removal, 3.8 second sinus pause 10/11/22   Hypertension    Peripheral artery disease [facility])     Past Surgical History:  Procedure Laterality Date   "stents in both my legs"     ABDOMINAL AORTOGRAM W/LOWER EXTREMITY N/A 10/11/2022   Procedure: ABDOMINAL AORTOGRAM W/LOWER EXTREMITY;  Surgeon:  Chuck Hint, MD;  Location: [facility] INVASIVE CV LAB;  Service: Cardiovascular;  Laterality: N/A;   ABDOMINAL AORTOGRAM W/LOWER EXTREMITY Left 06/20/2023   Procedure: ABDOMINAL AORTOGRAM W/LOWER EXTREMITY;  Surgeon: Daria Pastures, MD;  Location: [facility] INVASIVE CV LAB;  Service: Cardiovascular;  Laterality: Left;   COLONOSCOPY WITH PROPOFOL N/A 01/18/2023   Procedure: COLONOSCOPY WITH PROPOFOL;  Surgeon: Kerin Salen, MD;  Location: [facility] ENDOSCOPY;  Service: Gastroenterology;  Laterality: N/A;   ENDARTERECTOMY FEMORAL Left 10/15/2022   Procedure: ENDARTERECTOMY OF LEFT ILIOFEMORAL ARTERY;  Surgeon: Chuck Hint, MD;  Location: [facility] OR;  Service: Vascular;  Laterality: Left;   ESOPHAGOGASTRODUODENOSCOPY (EGD) WITH PROPOFOL N/A 01/18/2023   Procedure: ESOPHAGOGASTRODUODENOSCOPY (EGD) WITH PROPOFOL;  Surgeon: Kerin Salen, MD;  Location: [facility] ENDOSCOPY;  Service: Gastroenterology;  Laterality: N/A;   FEMORAL-POPLITEAL BYPASS GRAFT Left 10/15/2022   Procedure: LEFT FEMORAL-BELOW KNEE POPLITEAL ARTERY BYPASS WITH VEIN HARVEST OF LEFT GREATER SAPHENOUS VEIN;  Surgeon: Chuck Hint, MD;  Location: [facility] OR;  Service: Vascular;  Laterality: Left;   GIVENS CAPSULE STUDY N/A 04/10/2017   Procedure: GIVENS CAPSULE STUDY;  Surgeon: Charlott Rakes, MD;  Location: [facility] ENDOSCOPY;  Service: Endoscopy;  Laterality: N/A;   GIVENS CAPSULE STUDY N/A 01/18/2023   Procedure: GIVENS CAPSULE STUDY;  Surgeon: Kerin Salen, MD;  Location:  [facility] ENDOSCOPY;  Service: Gastroenterology;  Laterality: N/A;   IR GENERIC HISTORICAL  08/26/2015   IR RADIOLOGIST EVAL & MGMT 08/26/2015 Malachy Moan, MD [facility] INTERV RAD   IR GENERIC HISTORICAL  04/21/2016   IR RADIOLOGIST EVAL & MGMT 04/21/2016 Malachy Moan, MD [facility] INTERV RAD   LOWER EXTREMITY INTERVENTION Left 06/20/2023   Procedure: LOWER EXTREMITY INTERVENTION;  Surgeon: Daria Pastures, MD;  Location: [facility] INVASIVE CV LAB;  Service: Cardiovascular;  Laterality: Left;   DCB   PATCH ANGIOPLASTY Left 10/15/2022   Procedure: PATCH ANGIOPLASTY USING XENOSURE BIOLOGIC PATCH 1CMX14CM;  Surgeon: Chuck Hint, MD;  Location: [facility] OR;  Service: Vascular;  Laterality: Left;   PERIPHERAL VASCULAR INTERVENTION  10/11/2022   Procedure: PERIPHERAL VASCULAR INTERVENTION;  Surgeon: Chuck Hint, MD;  Location: [facility] INVASIVE CV LAB;  Service: Cardiovascular;;    No Known Allergies  Current Outpatient Medications  Medication Sig Dispense Refill   Ascorbic Acid (VITAMIN C) 1000 MG tablet Take 1 tablet (1,000 mg total) by mouth daily.     aspirin EC 81 MG tablet Take 1 tablet (81 mg total) by mouth daily at 6 (six) AM. Swallow whole. 30 tablet 12   atorvastatin (LIPITOR) 40 MG tablet Take 40 mg by mouth daily.     clopidogrel (PLAVIX) 75 MG tablet Take 1 tablet (75 mg total) by mouth daily. 90 tablet 1   Continuous Glucose Receiver (FREESTYLE LIBRE 2 READER) DEVI 1 Device by Does not apply route 3 (three) times daily. E11.69 1 each 11   Continuous Glucose Sensor (FREESTYLE LIBRE 2 SENSOR) MISC 2 Devices by Does not apply route daily. E11.69 2 each 11   Emollient (EUCERIN) lotion Apply 1 Application topically daily.     ferrous sulfate 325 (65 FE) MG tablet Take 1 tablet (325 mg total) by mouth daily with breakfast. 90 tablet 3   hydrochlorothiazide (HYDRODIURIL) 25 MG tablet Take 0.5 tablets (12.5 mg total) by mouth daily. (Patient taking differently: Take 25 mg by mouth daily.) 45 tablet 1   losartan (COZAAR) 50 MG tablet Take 50 mg by mouth in the morning and at bedtime.     Melatonin 10 MG TABS Take 10 mg by mouth at bedtime.     metFORMIN (GLUCOPHAGE) 1000 MG tablet Take 1 tablet (1,000 mg total) by mouth 2 (two) times daily with a meal. 180 tablet 1   OVER THE COUNTER MEDICATION *Has a special pink pill from [place] that he takes as needed to lower BG readings when high- could not tell me the name during call today     pantoprazole (PROTONIX) 40 MG tablet Take  40 mg by mouth daily.     TRESIBA FLEXTOUCH 100 UNIT/ML FlexTouch Pen Inject 38 Units into the skin daily. 11.4 mL 0   No current facility-administered medications for this visit.    Family History  Problem Relation Age of Onset   Diabetes Brother     Social History   Socioeconomic History   Marital status: Significant Other    Spouse name: Not on file   Number of children: Not on file   Years of education: Not on file   Highest education level: Not on file  Occupational History   Not on file  Tobacco Use   Smoking status: Former    Current packs/day: 0.00    Average packs/day: 1 pack/day for 36.0 years (36.0 ttl pk-yrs)    Types: Cigarettes    Start date: 11/20/1957    Quit date:  11/20/1993    Years since quitting: 29.6   Smokeless tobacco: Never  Vaping Use   Vaping status: Never Used  Substance and Sexual Activity   Alcohol use: Yes    Alcohol/week: 0.0 standard drinks of alcohol    Comment: occ   Drug use: Never   Sexual activity: Not on file  Other Topics Concern   Not on file  Social History Narrative   Not on file   Social Drivers of Health   Financial Resource Strain: Low Risk  (01/29/2022)   Received from [facility], [facility]   Overall Financial Resource Strain (CARDIA)    Difficulty of Paying Living Expenses: Not hard at all  Food Insecurity: No Food Insecurity (05/23/2023)   Hunger Vital Sign    Worried About Running Out of Food in the Last Year: Never true    Ran Out of Food in the Last Year: Never true  Transportation Needs: No Transportation Needs (05/23/2023)   PRAPARE - Administrator, Civil Service (Medical): No    Lack of Transportation (Non-Medical): No  Physical Activity: Not on file  Stress: No Stress Concern Present (01/29/2022)   Received from [facility], [facility]   [facility] of Occupational Health - Occupational Stress Questionnaire    Feeling of Stress : Not at all  Social Connections: Not on  file  Intimate Partner Violence: Not At Risk (05/23/2023)   Humiliation, Afraid, Rape, and Kick questionnaire    Fear of Current or Ex-Partner: No    Emotionally Abused: No    Physically Abused: No    Sexually Abused: No     REVIEW OF SYSTEMS:   [X]  denotes positive finding, [ ]  denotes negative finding Cardiac  Comments:  Chest pain or chest pressure:    Shortness of breath upon exertion:    Short of breath when lying flat:    Irregular heart rhythm:        Vascular    Pain in calf, thigh, or hip brought on by ambulation:    Pain in feet at night that wakes you up from your sleep:  x See HPI  Blood clot in your veins:    Leg swelling:         Pulmonary    Oxygen at home:    Productive cough:     Wheezing:         Neurologic    Sudden weakness in arms or legs:  x See HPI  Sudden numbness in arms or legs:     Sudden onset of difficulty speaking or slurred speech:    Temporary loss of vision in one eye:     Problems with dizziness:         Gastrointestinal    Blood in stool:     Vomited blood:         Genitourinary    Burning when urinating:     Blood in urine:        Psychiatric    Major depression:         Hematologic    Bleeding problems:    Problems with blood clotting too easily:        Skin    Rashes or ulcers:        Constitutional    Fever or chills:      PHYSICAL EXAMINATION:  Today's Vitals   06/23/23 1341 06/23/23 1344  BP: (!) 172/74   Pulse: 81   Temp: 98.5 F (36.9 C)  SpO2: 96%   Weight: 185 lb 6.4 oz (84.1 kg)   Height: 5\' 11"  (1.803 m)   PainSc: 3  3   PainLoc: Foot    Body mass index is 25.86 kg/m.Marland Kitchen   General:  WDWN in NAD; vital signs documented above Gait: Not observed HENT: WNL, normocephalic Pulmonary: normal non-labored breathing , without wheezing Cardiac: regular HR Abdomen: soft Skin: without rashes Vascular Exam/Pulses:  Right Left  Femoral absent 2+ (normal)  DP absent Brisk multiphasic  PT absent Brisk  multiphasic  Peroneal Faint  Brisk multiphasic   Extremities: right foot cool to touch compared to left; able to wiggle toes Musculoskeletal: no muscle wasting or atrophy  Neurologic: A&O X 3 Psychiatric:  The pt has Normal affect.   Non-Invasive Vascular Imaging:   ABI's/TBI's on 06/23/2023: Right:  0 - Great toe pressure: 0 Left:  0.98/0.19 - Great toe pressure: 34    Previous ABI's/TBI's on 06/03/2023: Right:  [place]/0 - Great toe pressure: 0 Left:  0.88/0.23 - Great toe pressure:  42     ASSESSMENT/PLAN:: 78 y.o. male here for follow up for PAD with hx of  left iliofemoral endarterectomy with pericardial patch angioplasty, left femoral to below knee popliteal bypass with non reversed translocated vein for gangrenous left great toe on 10/15/2022 by Dr. Edilia Bo.   On 06/20/2023, he underwent angiogram with drug coated balloon angioplasty of left femoral to popliteal bypass by Dr. Hetty Blend for threatened bypass.     -pt seen with Dr. Karin Lieu and pt  with sudden onset of pain and numbness of right foot.  He had angiogram on Monday via right CFA, which is not palpable on exam.  His abi today is zero.   -recommend pt go directly to [place] and he will be started on heparin gtt and will get CTA a/p with runoff.  Will need to be npo.  The last time he ate was this morning.     Doreatha Massed, [facility] [phone]  Clinic MD:   Karin Lieu

## 2023-06-23 NOTE — Telephone Encounter (Signed)
 Call placed to [facility] ER to inform of patient's upcoming arrival.  Verbal orders rec'd from Dr Gillis Santa - Heparin GTT, STAT CT Angio of Abdomen and Pelvis with Runoff and NPO.  Orders taken and acknowledged by Petra Kuba, PA.

## 2023-06-23 NOTE — Anesthesia Procedure Notes (Signed)
 Procedure Name: Intubation Date/Time: 06/23/2023 4:50 PM  Performed by: Darryl Nestle, CRNAPre-anesthesia Checklist: Patient identified, Emergency Drugs available, Suction available and Patient being monitored Patient Re-evaluated:Patient Re-evaluated prior to induction Oxygen Delivery Method: Circle system utilized Preoxygenation: Pre-oxygenation with 100% oxygen Induction Type: IV induction Ventilation: Mask ventilation without difficulty Laryngoscope Size: Mac and 3 Grade View: Grade I Tube type: Oral Tube size: 7.0 mm Number of attempts: 1 Airway Equipment and Method: Stylet and Oral airway Placement Confirmation: ETT inserted through vocal cords under direct vision, positive ETCO2 and breath sounds checked- equal and bilateral Secured at: 23 cm Tube secured with: Tape Dental Injury: Teeth and Oropharynx as per pre-operative assessment

## 2023-06-23 NOTE — Telephone Encounter (Signed)
 Triage : -pt LM because he has surgery with Dr. Hetty Blend on Tuesday and now he might be having a problem with his leg.  He was concerned and had his girlfriend and daughter come over. -returned call to pt who states beginning last night he began having numbness & weak feeling in his R leg and when he stood up his leg gave out.   He states his R foot is cold which is new.  He denies any swelling or firmness in his groin.   -ABI and PA appointments booked for today

## 2023-06-23 NOTE — Anesthesia Preprocedure Evaluation (Signed)
 Anesthesia Evaluation  Patient identified by MRN, date of birth, ID band Patient awake    Reviewed: Allergy & Precautions, NPO status , Patient's Chart, lab work & pertinent test results  History of Anesthesia Complications Negative for: history of anesthetic complications  Airway Mallampati: II  TM Distance: >3 FB Neck ROM: Full    Dental  (+) Edentulous Upper, Edentulous Lower, Dental Advisory Given   Pulmonary neg COPD, former smoker   breath sounds clear to auscultation       Cardiovascular hypertension, Pt. on medications (-) angina + Peripheral Vascular Disease  (-) Past MI  Rhythm:Regular   1. Left ventricular ejection fraction, by estimation, is 55 to 60%. The  left ventricle has normal function. Left ventricular endocardial border  not optimally defined to evaluate regional wall motion. There is mild left  ventricular hypertrophy. Left  ventricular diastolic parameters are consistent with Grade I diastolic  dysfunction (impaired relaxation).   2. Right ventricular systolic function is normal. The right ventricular  size is normal.   3. The mitral valve is abnormal. Trivial mitral valve regurgitation.   4. The aortic valve is tricuspid. There is mild calcification of the  aortic valve. There is mild thickening of the aortic valve. Aortic valve  regurgitation is not visualized. Aortic valve sclerosis is present, with  no evidence of aortic valve stenosis.     Neuro/Psych    GI/Hepatic negative GI ROS, Neg liver ROS,,,  Endo/Other  diabetes  Lab Results      Component                Value               Date                      HGBA1C                   6.2 (H)             03/18/2023             Renal/GU Lab Results      Component                Value               Date                      NA                       137                 06/23/2023                K                        4.3                  06/23/2023                CO2                      20 (L)              06/23/2023                GLUCOSE  81                  06/23/2023                BUN                      14                  06/23/2023                CREATININE               0.81                06/23/2023                CALCIUM                  9.6                 06/23/2023                EGFR                     73                  05/18/2023                GFRNONAA                 >60                 06/23/2023                Musculoskeletal   Abdominal   Peds  Hematology  (+) Blood dyscrasia, anemia Lab Results      Component                Value               Date                      WBC                      11.0 (H)            06/23/2023                HGB                      11.4 (L)            06/23/2023                HCT                      35.7 (L)            06/23/2023                MCV                      91.1                06/23/2023                PLT                      282  06/23/2023            Plavix/asa   Anesthesia Other Findings   Reproductive/Obstetrics                              Anesthesia Physical Anesthesia Plan  ASA: 3 and emergent  Anesthesia Plan: General   Post-op Pain Management: Minimal or no pain anticipated   Induction: Intravenous  PONV Risk Score and Plan: 2 and Ondansetron and Dexamethasone  Airway Management Planned: Oral ETT  Additional Equipment: Arterial line  Intra-op Plan:   Post-operative Plan: Extubation in OR  Informed Consent: I have reviewed the patients History and Physical, chart, labs and discussed the procedure including the risks, benefits and alternatives for the proposed anesthesia with the patient or authorized representative who has indicated his/her understanding and acceptance.     Dental advisory given  Plan Discussed with: CRNA  Anesthesia Plan Comments:           Anesthesia Quick Evaluation

## 2023-06-23 NOTE — Anesthesia Postprocedure Evaluation (Signed)
 Anesthesia Post Note  Patient: Randy Jacobson  Procedure(s) Performed: Rosalin Hawking, LOWER EXTREMITY (Right) COMMON FEMORAL ENDARTERECTOMY (Right: Groin) ANGIOPLASTY, USING PATCH GRAFT 6CM XENOSURE (Right: Groin) INSERTION, STENT, ARTERY, ILIAC (Right: Pelvis)     Patient location during evaluation: PACU Anesthesia Type: General Level of consciousness: awake and confused Pain management: pain level controlled Vital Signs Assessment: post-procedure vital signs reviewed and stable Respiratory status: spontaneous breathing, nonlabored ventilation, respiratory function stable and patient connected to nasal cannula oxygen Cardiovascular status: blood pressure returned to baseline and stable Postop Assessment: no apparent nausea or vomiting Anesthetic complications: no   No notable events documented.  Last Vitals:  Vitals:   06/23/23 2218 06/23/23 2230  BP:  (!) 120/50  Pulse: 86 87  Resp: 16 12  Temp: (!) 36.4 C   SpO2: 96% 97%    Last Pain:  Vitals:   06/23/23 2115  PainSc: 0-No pain                 Mariann Barter

## 2023-06-23 NOTE — ED Provider Notes (Signed)
  EMERGENCY DEPARTMENT AT [facility] Provider Note  CSN: [id number] Arrival date & time: 06/23/23 1428  Chief Complaint(s) Leg Pain  HPI Randy Jacobson is a 78 y.o. male with past medical history as below, significant for DM, hypertension, PAD who presents to the ED with complaint of right leg pain, right leg weakness  Patient went to vascular clinic, Randy Jacobson had right leg numbness and weakness, cool to the touch.  Randy Jacobson was advised to go directly to the ER from vascular clinic.  Dr. Hetty Blend at the bedside on patient arrival, will take patient emergent to the OR for embolectomy.  Patient has no other complaints, no chest pain, dyspnea, fevers chills, nausea or vomiting.  No recent trauma.  Other than his leg discomfort Randy Jacobson has no pain.  Past Medical History Past Medical History:  Diagnosis Date   Diabetes mellitus without complication (HCC)    Dysrhythmia    chronic RBBB; bradycardia s/p atropine following sheath removal, 3.8 second sinus pause 10/11/22   Hypertension    Peripheral artery disease [facility])    Patient Active Problem List   Diagnosis Date Noted   Near syncope 01/19/2023   Fecal occult blood test positive 01/19/2023   Anemia 01/19/2023   Orthostatic hypotension 01/16/2023   Gastroduodenitis 09/01/2021   History of adenomatous polyp of colon 09/01/2021   History of anemia 09/01/2021   Iron deficiency anemia due to chronic blood loss 09/01/2021   Cough 01/13/2021   History of peptic ulcer 09/04/2020   Insomnia 09/04/2020   Iron deficiency 09/04/2020   Proteinuria 09/04/2020   White blood cell abnormality 09/04/2020   Hyperglycemia due to type 2 diabetes mellitus (HCC) 09/01/2020   GI bleeding 04/08/2017   Diabetes mellitus, type 2 (HCC) 12/16/2015   Sepsis (HCC) 12/16/2015   Hyperlipidemia 12/16/2015   Hypertension 12/16/2015   Diarrhea    Febrile illness    Enteric campylobacteriosis    Claudication of right lower extremity (HCC)    PAD (peripheral  artery disease) (HCC)    Home Medication(s) Prior to Admission medications   Medication Sig Start Date End Date Taking? Authorizing Provider  Ascorbic Acid (VITAMIN C) 1000 MG tablet Take 1 tablet (1,000 mg total) by mouth daily. 05/18/23   Medina-Vargas, Monina C, NP  aspirin EC 81 MG tablet Take 1 tablet (81 mg total) by mouth daily at 6 (six) AM. Swallow whole. 10/19/22   Schuh, McKenzi P, PA-C  atorvastatin (LIPITOR) 40 MG tablet Take 40 mg by mouth daily.    [provider]  clopidogrel (PLAVIX) 75 MG tablet Take 1 tablet (75 mg total) by mouth daily. 05/18/23   Medina-Vargas, Monina C, NP  Continuous Glucose Receiver (FREESTYLE LIBRE 2 READER) DEVI 1 Device by Does not apply route 3 (three) times daily. E11.69 04/18/23   Medina-Vargas, Monina C, NP  Continuous Glucose Sensor (FREESTYLE LIBRE 2 SENSOR) MISC 2 Devices by Does not apply route daily. E11.69 04/18/23   Medina-Vargas, Monina C, NP  Emollient (EUCERIN) lotion Apply 1 Application topically daily.    [provider]  ferrous sulfate 325 (65 FE) MG tablet Take 1 tablet (325 mg total) by mouth daily with breakfast. 01/20/23   Arnetha Courser, MD  hydrochlorothiazide (HYDRODIURIL) 25 MG tablet Take 0.5 tablets (12.5 mg total) by mouth daily. Patient taking differently: Take 25 mg by mouth daily. 04/26/23   Medina-Vargas, Monina C, NP  losartan (COZAAR) 50 MG tablet Take 50 mg by mouth in the morning and at bedtime.  [provider]  Melatonin 10 MG TABS Take 10 mg by mouth at bedtime.    [provider]  metFORMIN (GLUCOPHAGE) 1000 MG tablet Take 1 tablet (1,000 mg total) by mouth 2 (two) times daily with a meal. 03/29/23   Medina-Vargas, Monina C, NP  OVER THE COUNTER MEDICATION *Has a special pink pill from [place] that Randy Jacobson takes as needed to lower BG readings when high- could not tell me the name during call today    [provider]  pantoprazole (PROTONIX) 40 MG tablet Take 40 mg by mouth daily.  09/02/21   [provider]  TRESIBA FLEXTOUCH 100 UNIT/ML FlexTouch Pen Inject 38 Units into the skin daily. 05/18/23   Medina-Vargas, Margit Banda, NP                                                                                                                                    Past Surgical History Past Surgical History:  Procedure Laterality Date   "stents in both my legs"     ABDOMINAL AORTOGRAM W/LOWER EXTREMITY N/A 10/11/2022   Procedure: ABDOMINAL AORTOGRAM W/LOWER EXTREMITY;  Surgeon: Chuck Hint, MD;  Location: [facility] INVASIVE CV LAB;  Service: Cardiovascular;  Laterality: N/A;   ABDOMINAL AORTOGRAM W/LOWER EXTREMITY Left 06/20/2023   Procedure: ABDOMINAL AORTOGRAM W/LOWER EXTREMITY;  Surgeon: Daria Pastures, MD;  Location: [facility] INVASIVE CV LAB;  Service: Cardiovascular;  Laterality: Left;   COLONOSCOPY WITH PROPOFOL N/A 01/18/2023   Procedure: COLONOSCOPY WITH PROPOFOL;  Surgeon: Kerin Salen, MD;  Location: [facility] ENDOSCOPY;  Service: Gastroenterology;  Laterality: N/A;   ENDARTERECTOMY FEMORAL Left 10/15/2022   Procedure: ENDARTERECTOMY OF LEFT ILIOFEMORAL ARTERY;  Surgeon: Chuck Hint, MD;  Location: [facility] OR;  Service: Vascular;  Laterality: Left;   ESOPHAGOGASTRODUODENOSCOPY (EGD) WITH PROPOFOL N/A 01/18/2023   Procedure: ESOPHAGOGASTRODUODENOSCOPY (EGD) WITH PROPOFOL;  Surgeon: Kerin Salen, MD;  Location: [facility] ENDOSCOPY;  Service: Gastroenterology;  Laterality: N/A;   FEMORAL-POPLITEAL BYPASS GRAFT Left 10/15/2022   Procedure: LEFT FEMORAL-BELOW KNEE POPLITEAL ARTERY BYPASS WITH VEIN HARVEST OF LEFT GREATER SAPHENOUS VEIN;  Surgeon: Chuck Hint, MD;  Location: [facility] OR;  Service: Vascular;  Laterality: Left;   GIVENS CAPSULE STUDY N/A 04/10/2017   Procedure: GIVENS CAPSULE STUDY;  Surgeon: Charlott Rakes, MD;  Location: [facility] ENDOSCOPY;  Service: Endoscopy;  Laterality: N/A;   GIVENS CAPSULE STUDY N/A 01/18/2023   Procedure: GIVENS CAPSULE STUDY;  Surgeon: Kerin Salen, MD;  Location: [facility] ENDOSCOPY;  Service: Gastroenterology;  Laterality: N/A;   IR GENERIC HISTORICAL  08/26/2015   IR RADIOLOGIST EVAL & MGMT 08/26/2015 Malachy Moan, MD [facility] INTERV RAD   IR GENERIC HISTORICAL  04/21/2016   IR RADIOLOGIST EVAL & MGMT 04/21/2016 Malachy Moan, MD [facility] INTERV RAD   LOWER EXTREMITY INTERVENTION Left 06/20/2023   Procedure: LOWER EXTREMITY INTERVENTION;  Surgeon: Daria Pastures, MD;  Location: [facility] INVASIVE CV LAB;  Service: Cardiovascular;  Laterality:  Left;  [facility]   PATCH ANGIOPLASTY Left 10/15/2022   Procedure: PATCH ANGIOPLASTY USING XENOSURE BIOLOGIC PATCH 1CMX14CM;  Surgeon: Chuck Hint, MD;  Location: [facility] OR;  Service: Vascular;  Laterality: Left;   PERIPHERAL VASCULAR INTERVENTION  10/11/2022   Procedure: PERIPHERAL VASCULAR INTERVENTION;  Surgeon: Chuck Hint, MD;  Location: [facility] INVASIVE CV LAB;  Service: Cardiovascular;;   Family History Family History  Problem Relation Age of Onset   Diabetes Brother     Social History Social History   Tobacco Use   Smoking status: Former    Current packs/day: 0.00    Average packs/day: 1 pack/day for 36.0 years (36.0 ttl pk-yrs)    Types: Cigarettes    Start date: 11/20/1957    Quit date: 11/20/1993    Years since quitting: 29.6   Smokeless tobacco: Never  Vaping Use   Vaping status: Never Used  Substance Use Topics   Alcohol use: Yes    Alcohol/week: 0.0 standard drinks of alcohol    Comment: occ   Drug use: Never   Allergies Patient has no known allergies.  Review of Systems A thorough review of systems was obtained and all systems are negative except as noted in the HPI and PMH.   Physical Exam Vital Signs  I have reviewed the triage vital signs BP (!) 146/68 (BP Location: Right Arm)   Pulse 89   Temp 98.3 F (36.8 C)   Resp 16   Ht 5\' 11"  (1.803 m)   Wt 83.9 kg   SpO2 97%   BMI 25.80 kg/m  Physical Exam Vitals and nursing note reviewed.  Constitutional:       General: Randy Jacobson is not in acute distress.    Appearance: Normal appearance. Randy Jacobson is well-developed. Randy Jacobson is not ill-appearing.  HENT:     Head: Normocephalic and atraumatic.     Right Ear: External ear normal.     Left Ear: External ear normal.     Nose: Nose normal.     Mouth/Throat:     Mouth: Mucous membranes are moist.  Eyes:     General: No scleral icterus.       Right eye: No discharge.        Left eye: No discharge.  Cardiovascular:     Rate and Rhythm: Normal rate.  Pulmonary:     Effort: Pulmonary effort is normal. No respiratory distress.     Breath sounds: No stridor.  Abdominal:     General: Abdomen is flat. There is no distension.     Palpations: Abdomen is soft.     Tenderness: There is no abdominal tenderness. There is no guarding.  Musculoskeletal:        General: No deformity.     Cervical back: No rigidity.     Comments: Right foot is cool to the touch, nonpalpable DP or PT pulse.    Skin:    General: Skin is warm and dry.     Coloration: Skin is not cyanotic, jaundiced or pale.  Neurological:     Mental Status: Randy Jacobson is alert and oriented to person, place, and time.     GCS: GCS eye subscore is 4. GCS verbal subscore is 5. GCS motor subscore is 6.  Psychiatric:        Speech: Speech normal.        Behavior: Behavior normal. Behavior is cooperative.     ED Results and Treatments Labs (all labs ordered are listed, but only abnormal results are displayed)  Labs Reviewed  COMPREHENSIVE METABOLIC PANEL  CBC WITH DIFFERENTIAL/PLATELET                                                                                                                          Radiology VAS [place] ABI WITH/WO TBI Result Date: 06/23/2023  LOWER EXTREMITY DOPPLER STUDY Patient Name:  Randy Jacobson  Date of Exam:   06/23/2023 Medical Rec #: [id number]      Accession #:    [id number] Date of Birth: 05/10/1945      Patient Gender: M Patient Age:   74 years Exam Location:  [facility] Procedure:      VAS [place] ABI WITH/WO TBI Referring Phys: Carolynn Sayers --------------------------------------------------------------------------------  Indications: Cold right foot status post left peripheral arterty intervention              via right CFA.  Vascular Interventions: 06/20/23: DCBA of left fem-pop bypass graft. Performing Technologist: Thereasa Parkin RVT  Examination Guidelines: A complete evaluation includes at minimum, Doppler waveform signals and systolic blood pressure reading at the level of bilateral brachial, anterior tibial, and posterior tibial arteries, when vessel segments are accessible. Bilateral testing is considered an integral part of a complete examination. Photoelectric Plethysmograph (PPG) waveforms and toe systolic pressure readings are included as required and additional duplex testing as needed. Limited examinations for reoccurring indications may be performed as noted.  ABI Findings: +---------+------------------+-----+--------+--------+ Right    Rt Pressure (mmHg)IndexWaveformComment  +---------+------------------+-----+--------+--------+ Brachial 183                                     +---------+------------------+-----+--------+--------+ ATA      0                 0.00                  +---------+------------------+-----+--------+--------+ PTA      0                 0.00                  +---------+------------------+-----+--------+--------+ Great Toe0                 0.00                  +---------+------------------+-----+--------+--------+ +---------+------------------+-----+--------+-------+ Left     Lt Pressure (mmHg)IndexWaveformComment +---------+------------------+-----+--------+-------+ Brachial 176                                    +---------+------------------+-----+--------+-------+ ATA      179               0.98                 +---------+------------------+-----+--------+-------+ PTA      33  0.18                 +---------+------------------+-----+--------+-------+ Great Toe34                0.19                 +---------+------------------+-----+--------+-------+ +-------+-----------+-----------+------------+------------+ ABI/TBIToday's ABIToday's TBIPrevious ABIPrevious TBI +-------+-----------+-----------+------------+------------+ Right  0          0          [place]          absent       +-------+-----------+-----------+------------+------------+ Left   0.98       0.19       0.88        0.23         +-------+-----------+-----------+------------+------------+  Previous ABI pre op on 06/03/23.  Summary: Right: Resting right ankle-brachial index indicates critical limb ischemia. The right toe-brachial index is abnormal. Limited duplex of right LE demonstrates occluded SFA. Left: Resting left ankle-brachial index is within normal range. The left toe-brachial index is abnormal. Likely falsely elevated ATA pressure.  *See table(s) above for measurements and observations.  Electronically signed by Gerarda Fraction on 06/23/2023 at 2:02:58 PM.    Final     Pertinent labs & imaging results that were available during my care of the patient were reviewed by me and considered in my medical decision making (see MDM for details).  Medications Ordered in ED Medications - No data to display                                                                                                                                   Procedures .Critical Care  Performed by: Sloan Leiter, DO Authorized by: Sloan Leiter, DO   Critical care provider statement:    Critical care time (minutes):  30   Critical care time was exclusive of:  Separately billable procedures and treating other patients   Critical care was necessary to treat or prevent imminent or life-threatening deterioration of the following conditions:  Circulatory failure   Critical care was time spent personally by me on the  following activities:  Development of treatment plan with patient or surrogate, discussions with consultants, evaluation of patient's response to treatment, examination of patient, ordering and review of laboratory studies, ordering and review of radiographic studies, ordering and performing treatments and interventions, pulse oximetry, re-evaluation of patient's condition, review of old charts and obtaining history from patient or surrogate   Care discussed with: admitting provider     (including critical care time)  Medical Decision Making / ED Course    Medical Decision Making:    Randy Jacobson is a 79 y.o. male with past medical history as below, significant for DM, hypertension, PAD who presents to the ED with complaint of right leg pain, right leg weakness. The complaint involves an extensive differential diagnosis and also carries with it a  high risk of complications and morbidity.  Serious etiology was considered. Ddx includes but is not limited to: Arterial occlusion, AVM, venous occlusion, cellulitis, etc.  Complete initial physical exam performed, notably the patient was in no distress, sitting comfortably on stretcher.    Reviewed and confirmed nursing documentation for past medical history, family history, social history.  Vital signs reviewed.       Brief summary: 78 year old male with history above here with concern for ischemic right leg from clinic. Nonpalpable DP / PT pulses to right foot, right foot is cool to the touch. Pain is mild.  Dr. Hetty Blend at bedside, patient will proceed to OR this afternoon for embolectomy.  Will admit to vascular.  Vascular does not want to wait for imaging Send to OR with Dr Hetty Blend, they will admit pt as primary               Additional history obtained: -Additional history obtained from family -External records from outside source obtained and reviewed including: Chart review including previous notes, labs, imaging,  consultation notes including  Prior vascular documentation, home medications   Lab Tests: -I ordered, reviewed, and interpreted labs.   The pertinent results include:   Labs Reviewed  COMPREHENSIVE METABOLIC PANEL  CBC WITH DIFFERENTIAL/PLATELET     EKG   EKG Interpretation Date/Time:    Ventricular Rate:    PR Interval:    QRS Duration:    QT Interval:    QTC Calculation:   R Axis:      Text Interpretation:           Imaging Studies ordered: na   Medicines ordered and prescription drug management: No orders of the defined types were placed in this encounter.   -I have reviewed the patients home medicines and have made adjustments as needed   Consultations Obtained: I requested consultation with the vascular dr Hetty Blend   Cardiac Monitoring: The patient was maintained on a cardiac monitor.  I personally viewed and interpreted the cardiac monitored which showed an underlying rhythm of: NSR Continuous pulse oximetry interpreted by myself, 98% on RA.    Social Determinants of Health:  Diagnosis or treatment significantly limited by social determinants of health: former smoker   Reevaluation: After the interventions noted above, I reevaluated the patient and found that they have stayed the same  Co morbidities that complicate the patient evaluation  Past Medical History:  Diagnosis Date   Diabetes mellitus without complication (HCC)    Dysrhythmia    chronic RBBB; bradycardia s/p atropine following sheath removal, 3.8 second sinus pause 10/11/22   Hypertension    Peripheral artery disease (HCC)       Dispostion: Disposition decision including need for hospitalization was considered, and patient admitted to the hospital.    Final Clinical Impression(s) / ED Diagnoses Final diagnoses:  Ischemic leg        Sloan Leiter, DO 06/28/23 2725

## 2023-06-23 NOTE — ED Triage Notes (Signed)
 Pt reports his right leg "gave out" yesterday when he was at work, causing him to fall down. Denies injuries from the fall. Denies head injury. He takes Plavix. He is here today because his vascular doctor told him to come for possible ischemia. His right does feel cool to touch. He had an angiogram on March 10 to his left leg.   Dr. Hetty Blend arrives in triage to assess the patient.

## 2023-06-23 NOTE — Anesthesia Procedure Notes (Signed)
 Arterial Line Insertion Start/End3/13/2025 4:50 PM, 06/23/2023 5:00 PM Performed by: Darryl Nestle, CRNA, CRNA  Patient location: Pre-op. Preanesthetic checklist: patient identified, IV checked, site marked, risks and benefits discussed, surgical consent, monitors and equipment checked, pre-op evaluation, timeout performed and anesthesia consent Lidocaine 1% used for infiltration Left, radial was placed Catheter size: 20 G Hand hygiene performed  and maximum sterile barriers used   Attempts: 3 Procedure performed using ultrasound guided technique. Ultrasound Notes:anatomy identified, needle tip was noted to be adjacent to the nerve/plexus identified and no ultrasound evidence of intravascular and/or intraneural injection Following insertion, dressing applied and Biopatch. Post procedure assessment: normal and unchanged  Post procedure complications: unsuccessful attempts. Patient tolerated the procedure well with no immediate complications.

## 2023-06-23 NOTE — Op Note (Signed)
 OPERATIVE NOTE  PROCEDURE:   Right common femoral endarterectomy with profundoplasty Aortogram with right lower extremity angiogram Right external iliac artery stenting with 8 x 60 PTX, postdilated with 7 x 60 Mustang  PRE-OPERATIVE DIAGNOSIS: [place] 2A acute limb ischemia  POST-OPERATIVE DIAGNOSIS: same as above   SURGEON: Daria Pastures MD  ASSISTANT(S): Sherald Hess, MD and Lianne Cure, PA  Given the complexity of the case,  the assistant was necessary in order to expedient the procedure and safely perform the technical aspects of the operation.  The assistant provided traction and countertraction to assist with exposure of the artery and vein.  They also assisted with suture ligation of multiple venous branches.  They played a critical role in the anastomosis. These skills, especially following the Prolene suture for the anastomosis, could not have been adequately performed by a scrub tech assistant.    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 male with who underwent left lower extremity angiogram with intervention due to a threatened bypass on Monday, 06/20/2023.  He presented to the office today due to acute onset of right lower extremity numbness associated with some mild to moderate pain.  He was noted to not have a right common femoral pulse and was transferred to the emergency department with plan for heparin drip and further evaluation.  We discussed that he likely had a closure device complication and risks and benefits of right common femoral artery exposure with possible  embolectomy and repair were reviewed, he and his girlfriend expressed understanding and elected to proceed.  DESCRIPTION: The patient was brought to the operating room and positioned supine on the operating room table.  General anesthesia was induced and the right leg was prepped and draped in the usual sterile fashion.  A timeout was performed and preoperative antibiotics were administered.  The right common femoral artery was mapped with ultrasound and a transverse incision was made overlying bifurcation.  Dissection was carried deeper with electrocautery and the inguinal ligament was identified and mobilized cephalad.  The anterior surface of the right common femoral artery was identified and a portion of the mid segment was dissected free circumferentially and vessel loop was passed for control.  It should be noted that the artery was severely calcified with dense calcific posterior wall plaque.  Dissection was carried further proximally, side branches were controlled with Silastic Vesseloops and the external iliac artery was circumferentially dissected.  Vesseloops were then used to control the profunda and SFA.  The patient was then systemically heparinized and clamps were placed on the external iliac, profunda and SFA.  A transverse arteriotomy was made overlying the femoral bifurcation.  There was no evidence of acute thrombosis although there was significant dense calcific disease on the posterior wall that extended into the proximal aspect of the profunda.  This dense calcific disease extended proximally into the external iliac artery.  Although there was no fresh thrombus, there was a diminished femoral artery pulse.  At this time my partner Dr. Chestine Spore scrubbed in for assistance.  The transverse arteriotomy was converted to a longitudinal arteriotomy with plans to perform femoral endarterectomy.  The dense calcific plaque was removed from the posterior wall in the mid segment of the common femoral  artery and transected.  We started by performing the endarterectomy for the proximal, the calcific plaque was dissected circumferentially from the wall and a large portion was able to be removed from the external iliac artery proximally.  We then carried the endarterectomy distally and the arteriotomy was also carried onto the profunda in order to better establish an endpoint.  A nice endpoint was created and the dense calcific plaque was removed.  At this point we realized the posterior wall was significantly thinned and there was a defect in one portion of the mid common femoral, this was repaired with interrupted 6-0 sutures.  The endpoint of the profunda was tacked down with interrupted 7-0 sutures.  We then performed a patch angioplasty and this was fashioned to the arteriotomy with a running 5-0 Prolene.  Prior to completion of the anastomosis all vessels were allowed to forward flush and backbleed.  It should be noted that there was significant bleeding at this point necessitating multiple repair sutures including pledgeted repair sutures.  Once there was some improvement and hemostasis there was still diminished femoral pulse.  And attention was turned to the endovascular portion of the case.  A micropuncture needle and wire were introduced to the proximal part of the patch and a micropuncture sheath was placed over the wire.  An angiogram demonstrated a focal dissection in the distal external iliac.  The access was then upsized to a 5 [place] sheath over a Bentson wire and using a bear catheter and a glide advantage wire I was able to navigate into the true lumen and the wire was placed into the aorta.  An aortogram confirmed excellent inflow and a widely patent right common iliac stent although there was a flow-limiting dissection in the distal external iliac artery.  A 8 x 60 Cook PTX was placed and this was postdilated with a 7 x 60 Mustang balloon.  Completion angiography demonstrated resolution of the  dissection with excellent flow through the stents.  There was a strong palpable pulse in the right common femoral at this time.  I then performed runoff of the right lower extremity which demonstrated an SFA occlusion and reconstitution of the above-knee popliteal artery with single-vessel runoff via the peroneal.  This was stable from his angiogram on Monday.  The proximal common femoral clamp was replaced and the Vesseloops on the profunda SFA and sidebranches were pulled up.  The sheath was removed and the access site was repaired with a 6-0 Prolene.  Heparin was reversed and we then spent another 45 minutes obtaining hemostasis as there was significant bleeding from the back wall repair.  This necessitated multiple repair sutures and pledgets.  We then placed Hemoblast hemostatic agent and hemostasis was achieved.  A top routine was brought onto the field and there was a multiphasic signal in the profunda artery.  We also used ultrasound to visualize the common femoral artery and profunda which demonstrated widely patent lumen.  The incision was then closed in layers with running 2-0 and 3-0 Vicryl and 4-0 Monocryl for the skin. All counts were correct at the end of the procedure.  The patient was transferred to recovery.    COMPLICATIONS: None apparent  CONDITION: Stable  Daria Pastures MD [facility] Phone Number: [phone] 06/23/2023 8:31 PM

## 2023-06-23 NOTE — Transfer of Care (Signed)
 Immediate Anesthesia Transfer of Care Note  Patient: Randy Jacobson  Procedure(s) Performed: Rosalin Hawking, LOWER EXTREMITY (Right) COMMON FEMORAL ENDARTERECTOMY (Right: Groin) ANGIOPLASTY, USING PATCH GRAFT 6CM XENOSURE (Right: Groin) INSERTION, STENT, ARTERY, ILIAC (Right: Pelvis)  Patient Location: PACU  Anesthesia Type:General  Level of Consciousness: confused  Airway & Oxygen Therapy: Patient Spontanous Breathing and Patient connected to nasal cannula oxygen  Post-op Assessment: Report given to RN and Post -op Vital signs reviewed and stable  Post vital signs: Reviewed and stable  Last Vitals:  Vitals Value Taken Time  BP 115/42 06/23/23 2107  Temp    Pulse 108 06/23/23 2115  Resp 26 06/23/23 2115  SpO2 97 % 06/23/23 2114  Vitals shown include unfiled device data.  Last Pain:  Vitals:   06/23/23 1622  PainSc: 0-No pain         Complications: No notable events documented.

## 2023-06-24 ENCOUNTER — Encounter (HOSPITAL_COMMUNITY): Payer: Self-pay | Admitting: Vascular Surgery

## 2023-06-24 LAB — BASIC METABOLIC PANEL
Anion gap: 10 (ref 5–15)
BUN: 17 mg/dL (ref 8–23)
CO2: 22 mmol/L (ref 22–32)
Calcium: 7.9 mg/dL — ABNORMAL LOW (ref 8.9–10.3)
Chloride: 102 mmol/L (ref 98–111)
Creatinine, Ser: 1.2 mg/dL (ref 0.61–1.24)
GFR, Estimated: >60 mL/min (ref >60)
Glucose, Bld: 182 mg/dL — ABNORMAL HIGH (ref 70–99)
Potassium: 3.9 mmol/L (ref 3.5–5.1)
Sodium: 134 mmol/L — ABNORMAL LOW (ref 135–145)

## 2023-06-24 LAB — GLUCOSE, CAPILLARY
Glucose-Capillary: 162 mg/dL — ABNORMAL HIGH (ref 70–99)
Glucose-Capillary: 238 mg/dL — ABNORMAL HIGH (ref 70–99)
Glucose-Capillary: 252 mg/dL — ABNORMAL HIGH (ref 70–99)
Glucose-Capillary: 297 mg/dL — ABNORMAL HIGH (ref 70–99)

## 2023-06-24 LAB — CBC
HCT: 22 % — ABNORMAL LOW (ref 39.0–52.0)
HCT: 22.5 % — ABNORMAL LOW (ref 39.0–52.0)
Hemoglobin: 7.4 g/dL — ABNORMAL LOW (ref 13.0–17.0)
Hemoglobin: 7.5 g/dL — ABNORMAL LOW (ref 13.0–17.0)
MCH: 29.7 pg (ref 26.0–34.0)
MCH: 29.6 pg (ref 26.0–34.0)
MCHC: 33.6 g/dL (ref 30.0–36.0)
MCHC: 33.3 g/dL (ref 30.0–36.0)
MCV: 88.4 fL (ref 80.0–100.0)
MCV: 88.9 fL (ref 80.0–100.0)
Platelets: 148 10*3/uL — ABNORMAL LOW (ref 150–400)
Platelets: 149 10*3/uL — ABNORMAL LOW (ref 150–400)
RBC: 2.49 MIL/uL — ABNORMAL LOW (ref 4.22–5.81)
RBC: 2.53 MIL/uL — ABNORMAL LOW (ref 4.22–5.81)
RDW: 19.8 % — ABNORMAL HIGH (ref 11.5–15.5)
RDW: 20.1 % — ABNORMAL HIGH (ref 11.5–15.5)
WBC: 13.4 10*3/uL — ABNORMAL HIGH (ref 4.0–10.5)
WBC: 12.5 10*3/uL — ABNORMAL HIGH (ref 4.0–10.5)
nRBC: 0 % (ref 0.0–0.2)
nRBC: 0 % (ref 0.0–0.2)

## 2023-06-24 LAB — LIPID PANEL
Cholesterol: 70 mg/dL (ref 0–200)
HDL: 24 mg/dL — ABNORMAL LOW (ref >40)
LDL Cholesterol: 31 mg/dL (ref 0–99)
Total CHOL/HDL Ratio: 2.9 ratio
Triglycerides: 74 mg/dL (ref <150)
VLDL: 15 mg/dL (ref 0–40)

## 2023-06-24 LAB — BPAM FFP
Blood Product Expiration Date: 202503152359
Blood Product Expiration Date: 202503162359
ISSUE DATE / TIME: 202503132142
ISSUE DATE / TIME: 202503132142
Unit Type and Rh: 600
Unit Type and Rh: 6200

## 2023-06-24 LAB — CREATININE, SERUM
Creatinine, Ser: 0.98 mg/dL (ref 0.61–1.24)
GFR, Estimated: >60 mL/min (ref >60)

## 2023-06-24 LAB — POCT ACTIVATED CLOTTING TIME
Activated Clotting Time: 245 s
Activated Clotting Time: 360 s

## 2023-06-24 LAB — PREPARE FRESH FROZEN PLASMA
Unit division: 0
Unit division: 0

## 2023-06-24 MED ORDER — VASOPRESSIN 20 UNIT/ML IV SOLN
INTRAVENOUS | Status: AC
  Filled 2023-06-24: qty 1

## 2023-06-24 NOTE — Progress Notes (Deleted)
 SATURATION QUALIFICATIONS: (This note is used to comply with regulatory documentation for home oxygen)  Patient Saturations on Room Air at Rest = 85%  Patient Saturations on Room Air while Ambulating = 85%  Patient Saturations on 4 Liters of oxygen while Ambulating = 95%  Please briefly explain why patient needs home oxygen: Pt requires 4L O2 via [place] to maintain SpO2 >90%O2 with ambulation.   Maycie Luera B. Beverely Risen PT, DPT Acute Rehabilitation Services Please use secure chat or  Call Office [phone]

## 2023-06-24 NOTE — Progress Notes (Addendum)
  Progress Note    06/24/2023 6:30 AM 1 Day Post-Op  Subjective:  no complaints.  Wants to know when he might go home  Afebrile HR 80's-90's  110's-150's systolic 98% RA  Vitals:   06/24/23 0500 06/24/23 0600  BP: (!) 150/51 (!) 137/56  Pulse: 89 88  Resp: 13 18  Temp:    SpO2: 96% 98%    Physical Exam: General:  no distress Cardiac:  regular Lungs:  non labored Incisions:  clean and dry with some ecchymosis present Extremities:  + right PT and peroneal doppler signal; right calf and anterior compartments are soft and non tender Abdomen:  soft  CBC    Component Value Date/Time   WBC 13.4 (H) 06/24/2023 0345   RBC 2.49 (L) 06/24/2023 0345   HGB 7.4 (L) 06/24/2023 0345   HGB 9.5 (L) 06/09/2023 0757   HGB 9.4 (L) 05/21/2019 1205   HCT 22.0 (L) 06/24/2023 0345   HCT 29.3 (L) 05/21/2019 1205   PLT 148 (L) 06/24/2023 0345   PLT 196 06/09/2023 0757   PLT 245 05/21/2019 1205   MCV 88.4 06/24/2023 0345   MCV 73 (L) 05/21/2019 1205   MCH 29.7 06/24/2023 0345   MCHC 33.6 06/24/2023 0345   RDW 19.8 (H) 06/24/2023 0345   RDW 15.7 (H) 05/21/2019 1205   LYMPHSABS 1.7 06/23/2023 1526   LYMPHSABS 0.5 (L) 05/21/2019 1205   MONOABS 1.6 (H) 06/23/2023 1526   EOSABS 0.2 06/23/2023 1526   EOSABS 0.0 05/21/2019 1205   BASOSABS 0.1 06/23/2023 1526   BASOSABS 0.1 05/21/2019 1205    BMET    Component Value Date/Time   NA 134 (L) 06/24/2023 0345   NA 132 (L) 05/21/2019 1205   K 3.9 06/24/2023 0345   CL 102 06/24/2023 0345   CO2 22 06/24/2023 0345   GLUCOSE 182 (H) 06/24/2023 0345   BUN 17 06/24/2023 0345   BUN 18 05/21/2019 1205   CREATININE 1.20 06/24/2023 0345   CREATININE 0.98 05/23/2023 1353   CREATININE 1.04 05/18/2023 1127   CALCIUM 7.9 (L) 06/24/2023 0345   GFRNONAA >60 06/24/2023 0345   GFRNONAA >60 05/23/2023 1353   GFRAA 94 05/21/2019 1205    INR    Component Value Date/Time   INR 1.1 10/15/2022 0634     Intake/Output Summary (Last 24 hours) at  06/24/2023 0630 Last data filed at 06/24/2023 0600 Gross per 24 hour  Intake 3932.03 ml  Output 3000 ml  Net 932.03 ml      Assessment/Plan:  78 y.o. male is s/p:  Right CFA endarterectomy with profundoplasty, aortogram with right EIA stenting  1 Day Post-Op   -pt with improved doppler flow right foot now with + PT and peroneal doppler flow.  No evidence of compartment syndrome -acute blood loss anemia-currently asymptomatic.   -needs to increase mobilization and ambulate and OOB to chair.  Possibly home this afternoon or tomorrow.   -DVT prophylaxis:  sq heparin -on asa/statin/plavix   Doreatha Massed, PA-C [facility] [phone] 06/24/2023 6:30 AM  VASCULAR STAFF ADDENDUM: I have independently interviewed and examined the patient. I agree with the above.  Right groin healing well, faint PT and peroneal signals.  Compartments soft. Explained I would like to keep her 1 more night and given his significant blood loss and his severely diseased artery.  Possible discharge tomorrow.  Daria Pastures MD [facility] Phone Number: [phone] 06/24/2023 5:10 PM

## 2023-06-24 NOTE — Progress Notes (Signed)
 Pt ambulated approx 100 ft hall with standby assist and a RW.  Back to bed after walk with call bell in reach and all needs met.  Will cont plan of care

## 2023-06-24 NOTE — Evaluation (Signed)
 Physical Therapy Evaluation Patient Details Name: Randy Jacobson MRN: [id number] DOB: [birth year] Today's Date: 06/24/2023  History of Present Illness  78 y.o. male who presented to ED 3/13 due to acute right leg numbness and weakness. After 3/10 angiogram with drug-coated balloon angioplasty of left femoral-popliteal artery bypass.  s/p 3/14 Right CFA endarterectomy with profundoplasty, aortogram with right EIA stenting  PMH: left iliofemoral endarterectomy with pericardial patch angioplasty, left femoral to below knee popliteal bypass with non reversed translocated vein for gangrenous left great toe on 10/15/2022, DM, HTN, peripheral artery disease  Clinical Impression  PTA pt was staying with girlfriend in single story home with steps to enter. Pt reports he received a RW at last hospitalization but did not use it, independent in ambulation, ADLs and iADLs.         If plan is discharge home, recommend the following: A little help with walking and/or transfers;A little help with bathing/dressing/bathroom;Assistance with cooking/housework;Direct supervision/assist for medications management;Direct supervision/assist for financial management;Assist for transportation;Help with stairs or ramp for entrance   Can travel by private vehicle    Yes    Equipment Recommendations None recommended by PT     Functional Status Assessment Patient has had a recent decline in their functional status and demonstrates the ability to make significant improvements in function in a reasonable and predictable amount of time.     Precautions / Restrictions Precautions Precautions: None Restrictions Weight Bearing Restrictions Per Provider Order: No      Mobility  Bed Mobility Overal bed mobility: Needs Assistance Bed Mobility: Rolling, Sidelying to Sit, Sit to Sidelying Rolling: Supervision, Used rails Sidelying to sit: Supervision, HOB elevated, Used rails     Sit to sidelying: Min assist General bed  mobility comments: supervision for rolling to L with use of bed rail, increased time and effort but able to push himself upto upright, complaints of increased abdominal pain with sitting up, requires min A for returning LE to bed    Transfers Overall transfer level: Needs assistance Equipment used: Rolling walker (2 wheels) Transfers: Sit to/from Stand, Bed to chair/wheelchair/BSC Sit to Stand: Mod assist          Lateral/Scoot Transfers: Min assist General transfer comment: heavy mod A for coming to upright, heavy lean toward L LE, able to take 4 lateral steps towards HoB    Ambulation/Gait               General Gait Details: deferred due to pain        Balance Overall balance assessment: Mild deficits observed, not formally tested                                           Pertinent Vitals/Pain Pain Assessment Pain Assessment: 0-10 Pain Score: 8  Pain Location: abdomen and to lesser extent R LE Pain Descriptors / Indicators: Aching, Sharp, Throbbing Pain Intervention(s): Limited activity within patient's tolerance, Monitored during session, Repositioned, Patient requesting pain meds-RN notified    Home Living Family/patient expects to be discharged to:: Private residence Living Arrangements: Spouse/significant other Available Help at Discharge: Friend(s);Available PRN/intermittently Type of Home: House Home Access: Level entry       Home Layout: One level Home Equipment: Agricultural consultant (2 wheels)      Prior Function Prior Level of Function : Independent/Modified Independent  Mobility Comments: reports he was given RW during last hospitalization, but has not used it much ADLs Comments: independent with ADLs and iADLs     Extremity/Trunk Assessment   Upper Extremity Assessment Upper Extremity Assessment: Defer to OT evaluation    Lower Extremity Assessment Lower Extremity Assessment: RLE deficits/detail;LLE  deficits/detail RLE Deficits / Details: ROM WFL, strength 3+/5 throughout RLE Sensation: decreased light touch LLE Deficits / Details: overall WFL    Cervical / Trunk Assessment Cervical / Trunk Assessment: Normal  Communication   Communication Communication: Impaired Factors Affecting Communication: Hearing impaired    Cognition Arousal: Lethargic Behavior During Therapy: Flat affect   PT - Cognitive impairments: No apparent impairments                         Following commands: Intact       Cueing Cueing Techniques: Verbal cues     General Comments General comments (skin integrity, edema, etc.): R LE noticibly cooler to touch while completing MMT, BP 163/70 in supine, with return to bed 147/71        Assessment/Plan    PT Assessment Patient needs continued PT services  PT Problem List Decreased strength;Decreased activity tolerance;Decreased balance;Decreased mobility;Decreased coordination;Cardiopulmonary status limiting activity;Impaired sensation       PT Treatment Interventions Gait training;DME instruction;Stair training;Functional mobility training;Therapeutic activities;Therapeutic exercise;Balance training;Cognitive remediation;Patient/family education    PT Goals (Current goals can be found in the Care Plan section)  Acute Rehab PT Goals PT Goal Formulation: With patient Time For Goal Achievement: 07/08/23 Potential to Achieve Goals: Fair    Frequency Min 3X/week        AM-PAC PT "6 Clicks" Mobility  Outcome Measure Help needed turning from your back to your side while in a flat bed without using bedrails?: None Help needed moving from lying on your back to sitting on the side of a flat bed without using bedrails?: None Help needed moving to and from a bed to a chair (including a wheelchair)?: A Lot Help needed standing up from a chair using your arms (e.g., wheelchair or bedside chair)?: A Lot Help needed to walk in hospital room?:  Total Help needed climbing 3-5 steps with a railing? : Total 6 Click Score: 14    End of Session Equipment Utilized During Treatment: Gait belt Activity Tolerance: Patient limited by pain;Patient limited by fatigue Patient left: in bed;with call bell/phone within reach;with bed alarm set Nurse Communication: Mobility status;Patient requests pain meds;Other (comment) (abdominal bloating) PT Visit Diagnosis: Unsteadiness on feet (R26.81);Other abnormalities of gait and mobility (R26.89);Muscle weakness (generalized) (M62.81);Difficulty in walking, not elsewhere classified (R26.2);Pain Pain - Right/Left: Right Pain - part of body: Leg (abdomen)    Time: 7829-5621 PT Time Calculation (min) (ACUTE ONLY): 14 min   Charges:   PT Evaluation $PT Eval Moderate Complexity: 1 Mod   PT General Charges $$ ACUTE PT VISIT: 1 Visit         Tian Mcmurtrey B. Beverely Risen PT, DPT Acute Rehabilitation Services Please use secure chat or  Call Office [phone]   Elon Alas [facility] 06/24/2023, 11:32 AM

## 2023-06-24 NOTE — Discharge Instructions (Signed)

## 2023-06-24 NOTE — Evaluation (Signed)
 Occupational Therapy Evaluation Patient Details Name: Randy Jacobson MRN: [id number] DOB: 1946/03/28 Today's Date: 06/24/2023   History of Present Illness   78 y.o. male who presented to ED 3/13 due to acute right leg numbness and weakness. After 3/10 angiogram with drug-coated balloon angioplasty of left femoral-popliteal artery bypass.  s/p 3/14 Right CFA endarterectomy with profundoplasty, aortogram with right EIA stenting  PMH: left iliofemoral endarterectomy with pericardial patch angioplasty, left femoral to below knee popliteal bypass with non reversed translocated vein for gangrenous left great toe on 10/15/2022, DM, HTN, peripheral artery disease     Clinical Impressions Pt admitted for above, PTA pt reports being ind in ADLs/iADLs and Mod I for mobility no AD typically. Pt currently completing OOB mobility with CGA + RW, is limited by RLE foot pain but able to tolerate distance ~62ft to bathroom. Anticipate pt could go further with pain tolerance progression. He is able to complete ADLs in sitting or standing with CGA to setup A. OT to continue working with pt while in acute stay to build upon OOB activity tolerance and determine and further DME needs pending his progress.      If plan is discharge home, recommend the following:   Assistance with cooking/housework;Assist for transportation     Functional Status Assessment   Patient has had a recent decline in their functional status and demonstrates the ability to make significant improvements in function in a reasonable and predictable amount of time.     Equipment Recommendations   None recommended by OT (Pt has rec DME)     Recommendations for Other Services         Precautions/Restrictions   Precautions Precautions: None Restrictions Weight Bearing Restrictions Per Provider Order: No     Mobility Bed Mobility Overal bed mobility: Needs Assistance Bed Mobility: Supine to Sit, Sit to Supine     Supine to  sit: Supervision Sit to supine: Supervision        Transfers Overall transfer level: Needs assistance Equipment used: Rolling walker (2 wheels) Transfers: Sit to/from Stand Sit to Stand: Contact guard assist                  Balance Overall balance assessment: Needs assistance Sitting-balance support: No upper extremity supported, Feet supported Sitting balance-Leahy Scale: Good Sitting balance - Comments: EOB for ADLs     Standing balance-Leahy Scale: Fair                             ADL either performed or assessed with clinical judgement   ADL Overall ADL's : Needs assistance/impaired Eating/Feeding: Independent;Sitting   Grooming: Standing;Contact guard assist   Upper Body Bathing: Sitting;Set up   Lower Body Bathing: Sitting/lateral leans;Set up   Upper Body Dressing : Sitting;Independent   Lower Body Dressing: Set up;Sitting/lateral leans Lower Body Dressing Details (indicate cue type and reason): doff/don bilat socks Toilet Transfer: Contact guard assist;Rolling walker (2 wheels);Regular Toilet   Toileting- Clothing Manipulation and Hygiene: Sit to/from stand;Contact guard assist       Functional mobility during ADLs: Contact guard assist;Rolling walker (2 wheels) General ADL Comments: Further mobility limited secondary to pain in RLE     Vision         Perception         Praxis         Pertinent Vitals/Pain Pain Assessment Pain Assessment: Faces Faces Pain Scale: Hurts whole lot Pain Location: RLE Pain  Descriptors / Indicators: Aching, Sharp, Throbbing Pain Intervention(s): Monitored during session, Limited activity within patient's tolerance, Repositioned     Extremity/Trunk Assessment Upper Extremity Assessment Upper Extremity Assessment: Overall WFL for tasks assessed   Lower Extremity Assessment RLE Deficits / Details: ROM WFL, strength 3+/5 throughout RLE Sensation: decreased light touch LLE Deficits / Details:  overall WFL   Cervical / Trunk Assessment Cervical / Trunk Assessment: Normal   Communication Communication Communication: Impaired Factors Affecting Communication: Hearing impaired   Cognition Arousal: Alert Behavior During Therapy: WFL for tasks assessed/performed Cognition: No apparent impairments                               Following commands: Intact       Cueing  General Comments   Cueing Techniques: Verbal cues  Pt HR up to 110-110 bpm with OOB mobility to bathroom   Exercises     Shoulder Instructions      Home Living Family/patient expects to be discharged to:: Private residence Living Arrangements: Spouse/significant other Available Help at Discharge: Friend(s);Available PRN/intermittently Type of Home: House Home Access: Level entry           Bathroom Shower/Tub: Walk-in shower   Bathroom Toilet: Standard Bathroom Accessibility: Yes   Home Equipment: Agricultural consultant (2 wheels);Grab bars - toilet;Shower seat          Prior Functioning/Environment Prior Level of Function : Independent/Modified Independent             Mobility Comments: reports he was given RW during last hospitalization, but has not used it much ADLs Comments: independent with ADLs and iADLs    OT Problem List: Pain;Impaired balance (sitting and/or standing)   OT Treatment/Interventions: Self-care/ADL training;Therapeutic exercise;Balance training;Patient/family education;DME and/or AE instruction;Therapeutic activities      OT Goals(Current goals can be found in the care plan section)   Acute Rehab OT Goals Patient Stated Goal: To go home OT Goal Formulation: With patient Time For Goal Achievement: 07/08/23 Potential to Achieve Goals: Good ADL Goals Pt Will Perform Grooming: with supervision;standing Pt Will Perform Lower Body Bathing: with modified independence;sitting/lateral leans Pt Will Perform Lower Body Dressing: with modified independence;sit  to/from stand Pt Will Transfer to Toilet: with supervision;ambulating Pt Will Perform Toileting - Clothing Manipulation and hygiene: with modified independence;sit to/from stand   OT Frequency:  Min 1X/week    Co-evaluation              AM-PAC OT "6 Clicks" Daily Activity     Outcome Measure Help from another person eating meals?: None Help from another person taking care of personal grooming?: A Little Help from another person toileting, which includes using toliet, bedpan, or urinal?: A Little Help from another person bathing (including washing, rinsing, drying)?: A Little Help from another person to put on and taking off regular upper body clothing?: A Little Help from another person to put on and taking off regular lower body clothing?: A Little 6 Click Score: 19   End of Session Equipment Utilized During Treatment: Gait belt;Rolling walker (2 wheels) Nurse Communication: Mobility status  Activity Tolerance: Patient tolerated treatment well Patient left: in bed;with call bell/phone within reach;with bed alarm set  OT Visit Diagnosis: Unsteadiness on feet (R26.81);Pain Pain - Right/Left: Right Pain - part of body: Ankle and joints of foot                Time: 4098-1191 OT Time Calculation (min):  15 min Charges:  OT General Charges $OT Visit: 1 Visit OT Evaluation $OT Eval Low Complexity: 1 Low  06/24/2023  AB, OTR/L  Acute Rehabilitation Services  Office: [phone]   Tristan Schroeder 06/24/2023, 4:14 PM

## 2023-06-24 NOTE — Plan of Care (Signed)

## 2023-06-25 LAB — CBC
HCT: 23.3 % — ABNORMAL LOW (ref 39.0–52.0)
Hemoglobin: 7.8 g/dL — ABNORMAL LOW (ref 13.0–17.0)
MCH: 30 pg (ref 26.0–34.0)
MCHC: 33.5 g/dL (ref 30.0–36.0)
MCV: 89.6 fL (ref 80.0–100.0)
Platelets: 150 10*3/uL (ref 150–400)
RBC: 2.6 MIL/uL — ABNORMAL LOW (ref 4.22–5.81)
RDW: 20.2 % — ABNORMAL HIGH (ref 11.5–15.5)
WBC: 14.8 10*3/uL — ABNORMAL HIGH (ref 4.0–10.5)
nRBC: 0 % (ref 0.0–0.2)

## 2023-06-25 LAB — GLUCOSE, CAPILLARY: Glucose-Capillary: 201 mg/dL — ABNORMAL HIGH (ref 70–99)

## 2023-06-25 MED ORDER — OXYCODONE-ACETAMINOPHEN 5-325 MG PO TABS
1.0000 | ORAL_TABLET | Freq: Four times a day (QID) | ORAL | 0 refills | Status: DC | PRN
Start: 2023-06-25 — End: 2023-08-10

## 2023-06-25 NOTE — Progress Notes (Signed)
 [name] to be D/C'd Home per MD order.  Discussed with the patient and all questions fully answered.  VSS, Skin clean, dry and intact without evidence of skin break down, no evidence of skin tears noted. IV catheter discontinued intact. Site without signs and symptoms of complications. Dressing and pressure applied.  An After Visit Summary was printed and given to the patient. Patient received prescription.  D/c education completed with patient/family including follow up instructions, medication list, d/c activities limitations if indicated, with other d/c instructions as indicated by MD - patient able to verbalize understanding, all questions fully answered.   Patient instructed to return to ED, call 911, or call MD for any changes in condition.   Patient escorted via WC, and D/C home via private auto.  Selena Batten [place] 06/25/2023 1:13 PM

## 2023-06-25 NOTE — Discharge Summary (Signed)
 Discharge Summary     Randy Jacobson 01-03-1946 78 y.o. male  [id number]  Admission Date: 06/23/2023  Discharge Date: 06/25/2023  Physician: Daria Pastures, MD  Admission Diagnosis: Ischemic leg [I99.8] History of embolectomy [Z98.890] PAD (peripheral artery disease) (HCC) [I73.9]  HPI:   This is a 78 y.o. male ***  Hospital Course:  The patient was admitted to the hospital and taken to the operating room on 06/23/2023 and underwent: ***    Findings: ***  The pt tolerated the procedure well and was transported to the PACU in {DESC; GOOD/FAIR/POOR:18685} condition.   By POD 1, ***   CBC    Component Value Date/Time   WBC 14.8 (H) 06/25/2023 0404   RBC 2.60 (L) 06/25/2023 0404   HGB 7.8 (L) 06/25/2023 0404   HGB 9.5 (L) 06/09/2023 0757   HGB 9.4 (L) 05/21/2019 1205   HCT 23.3 (L) 06/25/2023 0404   HCT 29.3 (L) 05/21/2019 1205   PLT 150 06/25/2023 0404   PLT 196 06/09/2023 0757   PLT 245 05/21/2019 1205   MCV 89.6 06/25/2023 0404   MCV 73 (L) 05/21/2019 1205   MCH 30.0 06/25/2023 0404   MCHC 33.5 06/25/2023 0404   RDW 20.2 (H) 06/25/2023 0404   RDW 15.7 (H) 05/21/2019 1205   LYMPHSABS 1.7 06/23/2023 1526   LYMPHSABS 0.5 (L) 05/21/2019 1205   MONOABS 1.6 (H) 06/23/2023 1526   EOSABS 0.2 06/23/2023 1526   EOSABS 0.0 05/21/2019 1205   BASOSABS 0.1 06/23/2023 1526   BASOSABS 0.1 05/21/2019 1205    BMET    Component Value Date/Time   NA 134 (L) 06/24/2023 0345   NA 132 (L) 05/21/2019 1205   K 3.9 06/24/2023 0345   CL 102 06/24/2023 0345   CO2 22 06/24/2023 0345   GLUCOSE 182 (H) 06/24/2023 0345   BUN 17 06/24/2023 0345   BUN 18 05/21/2019 1205   CREATININE 0.98 06/24/2023 0835   CREATININE 0.98 05/23/2023 1353   CREATININE 1.04 05/18/2023 1127   CALCIUM 7.9 (L) 06/24/2023 0345   GFRNONAA >60 06/24/2023 0835   GFRNONAA >60 05/23/2023 1353   GFRAA 94 05/21/2019 1205       Discharge Diagnosis:  Ischemic leg [I99.8] History of embolectomy  [Z98.890] PAD (peripheral artery disease) (HCC) [I73.9]  Secondary Diagnosis: Patient Active Problem List   Diagnosis Date Noted   History of embolectomy 06/23/2023   Near syncope 01/19/2023   Fecal occult blood test positive 01/19/2023   Anemia 01/19/2023   Orthostatic hypotension 01/16/2023   Gastroduodenitis 09/01/2021   History of adenomatous polyp of colon 09/01/2021   History of anemia 09/01/2021   Iron deficiency anemia due to chronic blood loss 09/01/2021   Cough 01/13/2021   History of peptic ulcer 09/04/2020   Insomnia 09/04/2020   Iron deficiency 09/04/2020   Proteinuria 09/04/2020   White blood cell abnormality 09/04/2020   Hyperglycemia due to type 2 diabetes mellitus (HCC) 09/01/2020   GI bleeding 04/08/2017   Diabetes mellitus, type 2 (HCC) 12/16/2015   Sepsis (HCC) 12/16/2015   Hyperlipidemia 12/16/2015   Hypertension 12/16/2015   Diarrhea    Febrile illness    Enteric campylobacteriosis    Claudication of right lower extremity (HCC)    PAD (peripheral artery disease) (HCC)    Past Medical History:  Diagnosis Date   Diabetes mellitus without complication (HCC)    Dysrhythmia    chronic RBBB; bradycardia s/p atropine following sheath removal, 3.8 second sinus pause 10/11/22   Hypertension  Peripheral artery disease (HCC)      Allergies as of 06/25/2023   No Known Allergies      Medication List     TAKE these medications    aspirin EC 81 MG tablet Take 1 tablet (81 mg total) by mouth daily at 6 (six) AM. Swallow whole.   atorvastatin 40 MG tablet Commonly known as: LIPITOR Take 40 mg by mouth daily.   clopidogrel 75 MG tablet Commonly known as: PLAVIX Take 1 tablet (75 mg total) by mouth daily.   ferrous sulfate 325 (65 FE) MG tablet Take 1 tablet (325 mg total) by mouth daily with breakfast. What changed: when to take this   FreeStyle Libre 2 Reader Devi 1 Device by Does not apply route 3 (three) times daily. E11.69   FreeStyle  Libre 2 Sensor Misc 2 Devices by Does not apply route daily. E11.69   hydrochlorothiazide 25 MG tablet Commonly known as: HYDRODIURIL Take 0.5 tablets (12.5 mg total) by mouth daily.   losartan 50 MG tablet Commonly known as: COZAAR Take 50 mg by mouth in the morning and at bedtime.   Melatonin 10 MG Tabs Take 10 mg by mouth at bedtime.   metFORMIN 1000 MG tablet Commonly known as: GLUCOPHAGE Take 1 tablet (1,000 mg total) by mouth 2 (two) times daily with a meal.   oxyCODONE-acetaminophen 5-325 MG tablet Commonly known as: Percocet Take 1 tablet by mouth every 6 (six) hours as needed.   pantoprazole 40 MG tablet Commonly known as: PROTONIX Take 40 mg by mouth daily.   sitaGLIPtin 100 MG tablet Commonly known as: JANUVIA Take 50 mg by mouth daily as needed (high BS).   Evaristo Bury FlexTouch 100 UNIT/ML FlexTouch Pen Generic drug: insulin degludec Inject 38 Units into the skin daily.   vitamin C 1000 MG tablet Take 1 tablet (1,000 mg total) by mouth daily.        Discharge Instructions: [facility] Discharge instructions Lower Extremity Bypass Surgery  Please refer to the following instruction for your post-procedure care. Your surgeon or physician assistant will discuss any changes with you.  Activity  You are encouraged to walk as much as you can. You can slowly return to normal activities during the month after your surgery. Avoid strenuous activity and heavy lifting until your doctor tells you it's OK. Avoid activities such as vacuuming or swinging a golf club. Do not drive until your doctor give the OK and you are no longer taking prescription pain medications. It is also normal to have difficulty with sleep habits, eating and bowel movement after surgery. These will go away with time.  Bathing/Showering  You may shower after you go home. Do not soak in a bathtub, hot tub, or swim until the incision heals completely.  Incision  Care  Clean your incision with mild soap and water. Shower every day. Pat the area dry with a clean towel. You do not need a bandage unless otherwise instructed. Do not apply any ointments or creams to your incision. If you have open wounds you will be instructed how to care for them or a visiting nurse may be arranged for you. If you have staples or sutures along your incision they will be removed at your post-op appointment. You may have skin glue on your incision. Do not peel it off. It will come off on its own in about one week.  Wash the groin wound with soap and water daily and pat dry. (No  tub bath-only shower)  Then put a dry gauze or washcloth in the groin to keep this area dry to help prevent wound infection.  Do this daily and as needed.  Do not use Vaseline or neosporin on your incisions.  Only use soap and water on your incisions and then protect and keep dry.  Diet  Resume your normal diet. There are no special food restrictions following this procedure. A low fat/ low cholesterol diet is recommended for all patients with vascular disease. In order to heal from your surgery, it is CRITICAL to get adequate nutrition. Your body requires vitamins, minerals, and protein. Vegetables are the best source of vitamins and minerals. Vegetables also provide the perfect balance of protein. Processed food has little nutritional value, so try to avoid this.  Medications  Resume taking all your medications unless your doctor or Physician Assistant tells you not to. If your incision is causing pain, you may take over-the-counter pain relievers such as acetaminophen (Tylenol). If you were prescribed a stronger pain medication, please aware these medication can cause nausea and constipation. Prevent nausea by taking the medication with a snack or meal. Avoid constipation by drinking plenty of fluids and eating foods with high amount of fiber, such as fruits, vegetables, and grains. Take Colace 100 mg (an  over-the-counter stool softener) twice a day as needed for constipation.  Do not take Tylenol if you are taking prescription pain medications.  Follow Up  Our office will schedule a follow up appointment 2-3 weeks following discharge.  Please call us immediately for any of the following conditions  Severe or worsening pain in your legs or feet while at rest or while walking Increase pain, redness, warmth, or drainage (pus) from your incision site(s) Fever of 101 degree or higher The swelling in your leg with the bypass suddenly worsens and becomes more painful than when you were in the hospital If you have been instructed to feel your graft pulse then you should do so every day. If you can no longer feel this pulse, call the office immediately. Not all patients are given this instruction.  Leg swelling is common after leg bypass surgery.  The swelling should improve over a few months following surgery. To improve the swelling, you may elevate your legs above the level of your heart while you are sitting or resting. Your surgeon or physician assistant may ask you to apply an ACE wrap or wear compression (TED) stockings to help to reduce swelling.  Reduce your risk of vascular disease  Stop smoking. If you would like help call [facility] at 1-800-QUIT-NOW (7270579038) or [place] at 5480469423.  Manage your cholesterol Maintain a desired weight Control your diabetes weight Control your diabetes Keep your blood pressure down  If you have any questions, please call the office at [phone]   Prescriptions given: 1.  Roxicet #20 No Refill  Disposition: home  Patient's condition: is Good  Follow up: 1. [facility] in 2 weeks on Dr. Hetty Blend clinic day for incision check.   Doreatha Massed, PA-C [facility] [phone] 06/25/2023  10:23 AM  - For VQI Registry use ---   Post-op:  Wound infection: No  Graft infection: No  Transfusion: Yes    If yes, 2  PRBC, 2 FFP units given New Arrhythmia: No Ipsilateral amputation: No, [ ]  Minor, [ ]  BKA, [ ]  AKA Discharge patency: [x ] Primary, [ ]  Primary assisted, [ ]  Secondary, [ ]  Occluded Patency judged by: Randy Jacobson ]  Dopper only, [ ]  Palpable graft pulse, []  Palpable distal pulse, [ ]  ABI inc. > 0.15, [ ]  Duplex Discharge ABI: R not done, L  D/C Ambulatory Status: Ambulatory  Complications: MI: No, [ ]  Troponin only, [ ]  EKG or Clinical CHF: No Resp failure:No, [ ]  Pneumonia, [ ]  Ventilator Chg in renal function: No, [ ]  Inc. Cr > 0.5, [ ]  Temp. Dialysis,  [ ]  Permanent dialysis Stroke: No, [ ]  Minor, [ ]  Major Return to OR: No  Reason for return to OR: [ ]  Bleeding, [ ]  Infection, [ ]  Thrombosis, [ ]  Revision  Discharge medications: Statin use:  yes ASA use:  yes Plavix use:  yes Beta blocker use: no CCB use:  No ACEI use:   no ARB use:  yes Coumadin use: no

## 2023-06-25 NOTE — Progress Notes (Addendum)
  Progress Note    06/25/2023 9:33 AM 2 Days Post-Op  Subjective:  wants to go home; says he has walked   Afebrile HR 80's-110's  130's-170's systolic 94% RA  Vitals:   06/25/23 0431 06/25/23 0812  BP: (!) 174/66 (!) 163/52  Pulse: 98 96  Resp: 16 19  Temp: 98.4 F (36.9 C) 97.6 F (36.4 C)  SpO2: 95% 95%    Physical Exam: General:  no distress Cardiac:  regular Lungs:  non labored Incisions:  clean and dry  Extremities:  + right PT>peroneal doppler flow right foot Abdomen:  soft  CBC    Component Value Date/Time   WBC 14.8 (H) 06/25/2023 0404   RBC 2.60 (L) 06/25/2023 0404   HGB 7.8 (L) 06/25/2023 0404   HGB 9.5 (L) 06/09/2023 0757   HGB 9.4 (L) 05/21/2019 1205   HCT 23.3 (L) 06/25/2023 0404   HCT 29.3 (L) 05/21/2019 1205   PLT 150 06/25/2023 0404   PLT 196 06/09/2023 0757   PLT 245 05/21/2019 1205   MCV 89.6 06/25/2023 0404   MCV 73 (L) 05/21/2019 1205   MCH 30.0 06/25/2023 0404   MCHC 33.5 06/25/2023 0404   RDW 20.2 (H) 06/25/2023 0404   RDW 15.7 (H) 05/21/2019 1205   LYMPHSABS 1.7 06/23/2023 1526   LYMPHSABS 0.5 (L) 05/21/2019 1205   MONOABS 1.6 (H) 06/23/2023 1526   EOSABS 0.2 06/23/2023 1526   EOSABS 0.0 05/21/2019 1205   BASOSABS 0.1 06/23/2023 1526   BASOSABS 0.1 05/21/2019 1205    BMET    Component Value Date/Time   NA 134 (L) 06/24/2023 0345   NA 132 (L) 05/21/2019 1205   K 3.9 06/24/2023 0345   CL 102 06/24/2023 0345   CO2 22 06/24/2023 0345   GLUCOSE 182 (H) 06/24/2023 0345   BUN 17 06/24/2023 0345   BUN 18 05/21/2019 1205   CREATININE 0.98 06/24/2023 0835   CREATININE 0.98 05/23/2023 1353   CREATININE 1.04 05/18/2023 1127   CALCIUM 7.9 (L) 06/24/2023 0345   GFRNONAA >60 06/24/2023 0835   GFRNONAA >60 05/23/2023 1353   GFRAA 94 05/21/2019 1205    INR    Component Value Date/Time   INR 1.1 10/15/2022 0634     Intake/Output Summary (Last 24 hours) at 06/25/2023 0933 Last data filed at 06/25/2023 0641 Gross per 24 hour   Intake --  Output 1825 ml  Net -1825 ml      Assessment/Plan:  78 y.o. male is s/p:  Right CFA endarterectomy with profundoplasty, aortogram with right EIA stenting  2 Days Post-Op   -pt with + doppler flow right foot.   -acute blood loss anemia stable and slight up from yesterday at 7.8 today.   -DVT prophylaxis:  sq heparin -continue asa/statin/plavix -probable discharge later today. -f/u in 2-3 weeks for incision check.    Doreatha Massed, PA-C [facility] [phone] 06/25/2023 9:33 AM  VASCULAR STAFF ADDENDUM: I have independently interviewed and examined the patient. I agree with the above.   Rande Brunt. Lenell Antu, MD [facility] Phone Number: [phone] 06/25/2023 12:59 PM

## 2023-06-26 LAB — TYPE AND SCREEN
ABO/RH(D): O NEG
Antibody Screen: POSITIVE
Donor AG Type: NEGATIVE
Donor AG Type: NEGATIVE
PT AG Type: NEGATIVE
Unit division: 0
Unit division: 0
Unit division: 0

## 2023-06-26 LAB — BPAM RBC
Blood Product Expiration Date: 202503262359
Blood Product Expiration Date: 202503302359
Blood Product Expiration Date: 202504012359
ISSUE DATE / TIME: 202502281742
ISSUE DATE / TIME: 202503132039
ISSUE DATE / TIME: 202503132039
Unit Type and Rh: 9500
Unit Type and Rh: 9500
Unit Type and Rh: 9500

## 2023-06-27 ENCOUNTER — Inpatient Hospital Stay: Payer: Medicare Other | Attending: Oncology

## 2023-06-27 ENCOUNTER — Telehealth: Payer: Self-pay

## 2023-06-27 DIAGNOSIS — D509 Iron deficiency anemia, unspecified: Secondary | ICD-10-CM | POA: Diagnosis not present

## 2023-06-27 LAB — CBC WITH DIFFERENTIAL (CANCER CENTER ONLY)
Abs Immature Granulocytes: 0.39 10*3/uL — ABNORMAL HIGH (ref 0.00–0.07)
Basophils Absolute: 0.1 10*3/uL (ref 0.0–0.1)
Basophils Relative: 1 %
Eosinophils Absolute: 0.5 10*3/uL (ref 0.0–0.5)
Eosinophils Relative: 4 %
HCT: 26.3 % — ABNORMAL LOW (ref 39.0–52.0)
Hemoglobin: 8.5 g/dL — ABNORMAL LOW (ref 13.0–17.0)
Immature Granulocytes: 3 %
Lymphocytes Relative: 9 %
Lymphs Abs: 1.1 10*3/uL (ref 0.7–4.0)
MCH: 29.4 pg (ref 26.0–34.0)
MCHC: 32.3 g/dL (ref 30.0–36.0)
MCV: 91 fL (ref 80.0–100.0)
Monocytes Absolute: 1.4 10*3/uL — ABNORMAL HIGH (ref 0.1–1.0)
Monocytes Relative: 12 %
Neutro Abs: 8.2 10*3/uL — ABNORMAL HIGH (ref 1.7–7.7)
Neutrophils Relative %: 71 %
Platelet Count: 267 10*3/uL (ref 150–400)
RBC: 2.89 MIL/uL — ABNORMAL LOW (ref 4.22–5.81)
RDW: 19 % — ABNORMAL HIGH (ref 11.5–15.5)
WBC Count: 11.6 10*3/uL — ABNORMAL HIGH (ref 4.0–10.5)
nRBC: 0.3 % — ABNORMAL HIGH (ref 0.0–0.2)

## 2023-06-27 LAB — FERRITIN: Ferritin: 193 ng/mL (ref 24–336)

## 2023-06-27 NOTE — Telephone Encounter (Signed)
 Form signed and given to [place] at [employer].

## 2023-06-27 NOTE — Telephone Encounter (Signed)
 Noted.

## 2023-06-27 NOTE — Transitions of Care (Post Inpatient/ED Visit) (Signed)
   06/27/2023  Name: Randy Jacobson MRN: [id number] DOB: 1945-09-18  Today's TOC FU Call Status: Today's TOC FU Call Status:: Successful TOC FU Call Completed (Spoke with patient who began talking but then got upset and voiced frustration about the number of calls he gets - unable to complete the call or enroll patient in [facility] program) TOC FU Call Complete Date: 06/27/23 Patient's Name and Date of Birth confirmed.  Transition Care Management Follow-up Telephone Call How have you been since you were released from the hospital?: Better Any questions or concerns?: No (Patient was upset with the call and said he gets 25 calls a day about his health. Unable to complete the call/unable to enroll)  Items Reviewed: Did you receive and understand the discharge instructions provided?:  (patient declined to continue TOC Call/program)  Medications Reviewed Today: Patient declined to continue call or enroll  Medications Reviewed Today   Medications were not reviewed in this encounter      Patient accepted [facility] RN name/number but declined to continue call or enroll.    Hilbert Odor RN, CCM [place]  VBC[facility]Population Health RN Care Manager [phone]

## 2023-06-27 NOTE — Transitions of Care (Post Inpatient/ED Visit) (Signed)
   06/27/2023  Name: Randy Jacobson MRN: [id number] DOB: 03-04-1946  Today's TOC FU Call Status: Today's TOC FU Call Status:: Unsuccessful Call (1st Attempt) Unsuccessful Call (1st Attempt) Date: 06/27/23  Attempted to reach the patient regarding the most recent Inpatient/ED visit.  Follow Up Plan: Additional outreach attempts will be made to reach the patient to complete the Transitions of Care (Post Inpatient/ED visit) call.   Hilbert Odor RN, CCM [place]  VBCI-Population Health RN Care Manager [phone]

## 2023-06-28 ENCOUNTER — Telehealth: Payer: Self-pay

## 2023-06-28 NOTE — Telephone Encounter (Signed)
-----   Message from Thornton Papas sent at 06/27/2023  7:43 PM EDT ----- Please call patient, iron level is higher, hb is stable, f/u as scheduled

## 2023-06-28 NOTE — Telephone Encounter (Signed)
 Spoke with patient, understood, no further questions.

## 2023-06-28 NOTE — Telephone Encounter (Signed)
 Contacted patient per provider Hgb stable,higher iron level, follow-up as scheduled unable to leave a voicemail. Will retry to contact later on.

## 2023-07-07 NOTE — Progress Notes (Unsigned)
 POST OPERATIVE OFFICE NOTE    CC:  F/u for surgery  HPI:  This is a 78 y.o. male who is s/p Right CFA endarterectomy with profundoplasty, aortogram with right EIA stenting  on 06/24/2023 by Dr. Hetty Blend.    Pt has hx of  left iliofemoral endarterectomy with pericardial patch angioplasty, left femoral to below knee popliteal bypass with non reversed translocated vein for gangrenous left great toe on 10/15/2022 by Dr. Edilia Bo.   On 06/20/2023, he underwent angiogram with drug coated balloon angioplasty of left femoral to popliteal bypass by Dr. Hetty Blend for threatened bypass.  He did have acute blood loss anemia and required two units FFP and two PRBC's.  He was discharged on POD 2.  He had + right PT> peroneal doppler flow right foot.   Pt returns today for follow up.  Pt states he is doing well.  The only complaint he has about his legs are they are sore because he hasn't been using them before revascularization and he has neuropathy.  He is compliant with his asa/plavix/statin.   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   No Known Allergies  Current Outpatient Medications  Medication Sig Dispense Refill   Ascorbic Acid (VITAMIN C) 1000 MG tablet Take 1 tablet (1,000 mg total) by mouth daily.     aspirin EC 81 MG tablet Take 1 tablet (81 mg total) by mouth daily at 6 (six) AM. Swallow whole. 30 tablet 12   atorvastatin (LIPITOR) 40 MG tablet Take 40 mg by mouth daily.     clopidogrel (PLAVIX) 75 MG tablet Take 1 tablet (75 mg total) by mouth daily. 90 tablet 1   Continuous Glucose Receiver (FREESTYLE LIBRE 2 READER) DEVI 1 Device by Does not apply route 3 (three) times daily. E11.69 1 each 11   Continuous Glucose Sensor (FREESTYLE LIBRE 2 SENSOR) MISC 2 Devices by Does not apply route daily. E11.69 2 each 11   ferrous sulfate 325 (65 FE) MG tablet Take 1 tablet (325 mg total) by mouth daily with breakfast. (Patient taking differently: Take 325 mg by mouth in the morning and at bedtime.)  90 tablet 3   hydrochlorothiazide (HYDRODIURIL) 25 MG tablet Take 0.5 tablets (12.5 mg total) by mouth daily. 45 tablet 1   losartan (COZAAR) 50 MG tablet Take 50 mg by mouth in the morning and at bedtime.     Melatonin 10 MG TABS Take 10 mg by mouth at bedtime.     metFORMIN (GLUCOPHAGE) 1000 MG tablet Take 1 tablet (1,000 mg total) by mouth 2 (two) times daily with a meal. 180 tablet 1   oxyCODONE-acetaminophen (PERCOCET) 5-325 MG tablet Take 1 tablet by mouth every 6 (six) hours as needed. 20 tablet 0   pantoprazole (PROTONIX) 40 MG tablet Take 40 mg by mouth daily.     sitaGLIPtin (JANUVIA) 100 MG tablet Take 50 mg by mouth daily as needed (high BS).     TRESIBA FLEXTOUCH 100 UNIT/ML FlexTouch Pen Inject 38 Units into the skin daily. 11.4 mL 0   No current facility-administered medications for this visit.     ROS:  See HPI  Physical Exam:  Today's Vitals   07/08/23 0820  BP: (!) 141/64  Pulse: 98  Temp: 98.8 F (37.1 C)  TempSrc: Temporal  SpO2: 93%  Weight: 185 lb (83.9 kg)  Height: 5\' 11"  (1.803 m)   Body mass index is 25.8 kg/m.   Incision:  right groin incision is healing nicely Extremities:  brisk multiphasic right peroneal doppler flow and palpable left DP pulse with brisk multiphasic left DP doppler flow     Assessment/Plan:  This is a 78 y.o. male who is s/p: Right CFA endarterectomy with profundoplasty, aortogram with right EIA stenting  on 06/24/2023 by Dr. Hetty Blend.    -pt's incision is healing nicely. He has brisk multiphasic right pero and left DP doppler flow.  Continue walking program.   -discussed protecting his feet and inspecting them daily. -continue asa/statin/plavix -f/u in 3 months with LLE arterial duplex and ABI.  Will have him see Dr. Hetty Blend at that visit since he did not see him today.  Pt knows to call sooner if any issues before his next visit.  He expressed understanding.    Doreatha Massed, [facility] [phone]   Clinic MD:  Hetty Blend on call MD

## 2023-07-08 ENCOUNTER — Ambulatory Visit (INDEPENDENT_AMBULATORY_CARE_PROVIDER_SITE_OTHER): Admitting: Physician Assistant

## 2023-07-08 ENCOUNTER — Encounter: Payer: Self-pay | Admitting: Physician Assistant

## 2023-07-08 VITALS — BP 141/64 | HR 98 | Temp 98.8°F | Ht 71.0 in | Wt 185.0 lb

## 2023-07-08 DIAGNOSIS — I70221 Atherosclerosis of native arteries of extremities with rest pain, right leg: Secondary | ICD-10-CM

## 2023-07-11 ENCOUNTER — Other Ambulatory Visit: Payer: Self-pay

## 2023-07-11 ENCOUNTER — Inpatient Hospital Stay: Payer: Medicare Other

## 2023-07-11 ENCOUNTER — Inpatient Hospital Stay: Payer: Medicare Other | Admitting: Nurse Practitioner

## 2023-07-11 DIAGNOSIS — I7025 Atherosclerosis of native arteries of other extremities with ulceration: Secondary | ICD-10-CM

## 2023-07-15 ENCOUNTER — Encounter: Payer: Self-pay | Admitting: *Deleted

## 2023-07-15 ENCOUNTER — Telehealth: Payer: Self-pay | Admitting: Nurse Practitioner

## 2023-07-15 NOTE — Progress Notes (Signed)
"  No show" for lab/OV On 3/31. Scheduling message sent to reshedule for later in April.

## 2023-07-15 NOTE — Telephone Encounter (Signed)
 Called to reschedule missed appts from 3/31 per inbasket. LVM to return call for scheduling.

## 2023-07-19 ENCOUNTER — Other Ambulatory Visit: Payer: Self-pay | Admitting: Adult Health

## 2023-07-19 MED ORDER — TRESIBA FLEXTOUCH 100 UNIT/ML ~~LOC~~ SOPN: 38.0000 [IU] | PEN_INJECTOR | Freq: Every day | SUBCUTANEOUS | 0 refills | Status: DC

## 2023-07-19 MED ORDER — LOSARTAN POTASSIUM 50 MG PO TABS: 50.0000 mg | ORAL_TABLET | Freq: Two times a day (BID) | ORAL | 1 refills | Status: DC

## 2023-07-19 NOTE — Telephone Encounter (Signed)
 Last Fill: Losartan: Unknown     Tresiba: 05/18/23 11.4 mL/0 RF  Last OV: 05/18/23 Next OV: None Scheduled  Routing to provider for review/authorization.

## 2023-07-19 NOTE — Telephone Encounter (Signed)
 Copied from CRM [id number]. Topic: Clinical - Medication Refill >> Jul 19, 2023  9:44 AM Everette Rank wrote: Most Recent Primary Care Visit:  Provider: Gillis Santa  Department: [facility]  Visit Type: OFFICE VISIT  Date: 05/18/2023  Medication: losartan (COZAAR) 50 MG tablet TRESIBA FLEXTOUCH 100 UNIT/ML FlexTouch Pen   Has the patient contacted their pharmacy? Yes (Agent: If no, request that the patient contact the pharmacy for the refill. If patient does not wish to contact the pharmacy document the reason why and proceed with request.) (Agent: If yes, when and what did the pharmacy advise?)  Is this the correct pharmacy for this prescription? Yes If no, delete pharmacy and type the correct one.  This is the patient's preferred pharmacy:  [facility] [address] [place] - [address] [place] [address] [place] [address] Phone: [phone] Fax: [phone]  Has the prescription been filled recently? Yes  Is the patient out of the medication? Yes  Has the patient been seen for an appointment in the last year OR does the patient have an upcoming appointment? Yes  Can we respond through MyChart? No  Agent: Please be advised that Rx refills may take up to 3 business days. We ask that you follow-up with your pharmacy.

## 2023-07-19 NOTE — Telephone Encounter (Signed)
 Patient has request refill on medications. Medications pend and sent to PCP Medina-Vargas, Monina C, NP for approval.

## 2023-07-21 ENCOUNTER — Telehealth: Payer: Self-pay

## 2023-07-21 NOTE — Telephone Encounter (Signed)
 Copied from CRM [id number]. Topic: Clinical - Prescription Issue >> Jul 21, 2023  9:16 AM Maree Krabbe H wrote: Reason for CRM: Patients called and wanted to know why his medicine cost has gone up and is concerned because its been $35 for years and it went up to $70, patient can't afford this patients callback number is [phone].  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 Patient stated he will consult with is insurance company and call of back if needed

## 2023-07-29 ENCOUNTER — Encounter (HOSPITAL_COMMUNITY)

## 2023-07-29 ENCOUNTER — Encounter: Admitting: Vascular Surgery

## 2023-08-01 ENCOUNTER — Other Ambulatory Visit: Payer: Self-pay | Admitting: Adult Health

## 2023-08-03 ENCOUNTER — Inpatient Hospital Stay: Attending: Oncology

## 2023-08-03 ENCOUNTER — Encounter: Payer: Self-pay | Admitting: Nurse Practitioner

## 2023-08-03 ENCOUNTER — Inpatient Hospital Stay: Admitting: Nurse Practitioner

## 2023-08-03 ENCOUNTER — Telehealth: Payer: Self-pay

## 2023-08-03 ENCOUNTER — Other Ambulatory Visit: Payer: Self-pay | Admitting: Adult Health

## 2023-08-03 VITALS — BP 138/65 | HR 92 | Temp 98.1°F | Resp 18 | Ht 71.0 in | Wt 183.1 lb

## 2023-08-03 DIAGNOSIS — D509 Iron deficiency anemia, unspecified: Secondary | ICD-10-CM

## 2023-08-03 DIAGNOSIS — D649 Anemia, unspecified: Secondary | ICD-10-CM | POA: Diagnosis not present

## 2023-08-03 DIAGNOSIS — E1169 Type 2 diabetes mellitus with other specified complication: Secondary | ICD-10-CM

## 2023-08-03 LAB — CBC WITH DIFFERENTIAL (CANCER CENTER ONLY)
Abs Immature Granulocytes: 0.06 10*3/uL (ref 0.00–0.07)
Basophils Absolute: 0.1 10*3/uL (ref 0.0–0.1)
Basophils Relative: 1 %
Eosinophils Absolute: 0.2 10*3/uL (ref 0.0–0.5)
Eosinophils Relative: 3 %
HCT: 30.3 % — ABNORMAL LOW (ref 39.0–52.0)
Hemoglobin: 10 g/dL — ABNORMAL LOW (ref 13.0–17.0)
Immature Granulocytes: 1 %
Lymphocytes Relative: 13 %
Lymphs Abs: 1 10*3/uL (ref 0.7–4.0)
MCH: 30.7 pg (ref 26.0–34.0)
MCHC: 33 g/dL (ref 30.0–36.0)
MCV: 92.9 fL (ref 80.0–100.0)
Monocytes Absolute: 1.1 10*3/uL — ABNORMAL HIGH (ref 0.1–1.0)
Monocytes Relative: 13 %
Neutro Abs: 5.5 10*3/uL (ref 1.7–7.7)
Neutrophils Relative %: 69 %
Platelet Count: 274 10*3/uL (ref 150–400)
RBC: 3.26 MIL/uL — ABNORMAL LOW (ref 4.22–5.81)
RDW: 17.1 % — ABNORMAL HIGH (ref 11.5–15.5)
WBC Count: 8 10*3/uL (ref 4.0–10.5)
nRBC: 0 % (ref 0.0–0.2)

## 2023-08-03 LAB — FERRITIN: Ferritin: 43 ng/mL (ref 24–336)

## 2023-08-03 NOTE — Telephone Encounter (Signed)
 Copied from CRM [id number]. Topic: Clinical - Prescription Issue >> Aug 03, 2023  1:54 PM Tisa Forester wrote: Reason for CRM: patient want to know if can get cheaper or a generic for the sitaGLIPtin (JANUVIA) 100 MG tablet , last time order was $450 for 3 month  Patient stated his income is social security and get under $1000 Patient stated help his sugar level Patient is almost out of the medication   [facility] - [place], [place] - [address] [place] [address] Phone: [phone] Fax: [phone] Hours: Not open 24 hours  Patient call back #[phone]

## 2023-08-03 NOTE — Progress Notes (Signed)
  [facility] OFFICE PROGRESS NOTE   Diagnosis: Iron  deficiency anemia  INTERVAL HISTORY:   Mr. Kleve returns for follow-up.  Energy level continues to be improved.  No dizziness.  He denies bleeding.  He continues twice daily oral iron .  No constipation or nausea.  Objective:  Vital signs in last 24 hours:  Blood pressure 138/65, pulse 92, temperature 98.1 F (36.7 C), temperature source Temporal, resp. rate 18, height 5\' 11"  (1.803 m), weight 183 lb 1.6 oz (83.1 kg), SpO2 98%.     Resp: Distant breath sounds.  No respiratory distress. Cardio: Regular rate and rhythm. GI: No hepatosplenomegaly. Vascular: No leg edema.  Lab Results:  Lab Results  Component Value Date   WBC 8.0 08/03/2023   HGB 10.0 (L) 08/03/2023   HCT 30.3 (L) 08/03/2023   MCV 92.9 08/03/2023   PLT 274 08/03/2023   NEUTROABS 5.5 08/03/2023    Imaging:  No results found.  Medications: I have reviewed the patient's current medications.  Assessment/Plan: Anemia Chronic anemia with intermittent Red cell microcytosis Documented iron  deficiency on repeat ferritin testing including 05/23/2023 He has received Feraheme  and Venofer  in the past 01/16/2023 fecal occult blood positive; negative GI workup for a source of blood loss including upper endoscopy, colonoscopy, and small bowel camera endoscopy 01/18/2023 Feraheme  05/26/2023, 06/02/2023 History of iron  deficiency anemia in 2018 Gastric ulcers 2018 4.   Diabetes 5.   Hypertension 6.   Peripheral vascular disease-maintained on aspirin  and Plavix  7.   Carotid artery stenosis 8.   Peripheral neuropathy  Disposition: Mr. Perrier appears stable.  Hemoglobin has partially corrected.  He is no longer having symptoms of anemia.  We will follow-up on the ferritin from today.  He will continue oral iron  twice daily.  He will return for lab and follow-up in 2 months.    Diana Forster ANP/GNP-BC   08/03/2023  1:47 PM

## 2023-08-03 NOTE — Telephone Encounter (Signed)
 Randy Jacobson called in to request a new refill for htcz as the medication is lost. See CRM below for details. Please follow up regarding missing medication refill   Copied from CRM [id number]. Topic: Clinical - Prescription Issue >> Aug 03, 2023  9:26 AM Randy Jacobson wrote: Reason for CRM: Randy Jacobson went to pharmacy 3 or 4 days ago went to pharmacy regarding in blood pressure medication his Randy Jacobson his girlfriend she has dementia and lost Randy Jacobson blood pressure medication and patient do not know what she did with it .   hydrochlorothiazide  (HYDRODIURIL ) 25 MG tablet and Randy Jacobson requesting if can get  more of the blood pressure medication   [facility] - [place], [place] - [address] [place] [address] Phone: [phone] Fax: [phone] Hours: Not open 24 hours  Randy Jacobson call back number  [phone]

## 2023-08-03 NOTE — Telephone Encounter (Signed)
 Referred him to [facility], pharmacy.

## 2023-08-04 ENCOUNTER — Telehealth: Payer: Self-pay | Admitting: *Deleted

## 2023-08-04 ENCOUNTER — Telehealth: Payer: Self-pay

## 2023-08-04 MED ORDER — HYDROCHLOROTHIAZIDE 25 MG PO TABS: 12.5000 mg | ORAL_TABLET | Freq: Every day | ORAL | 0 refills | Status: DC

## 2023-08-04 NOTE — Telephone Encounter (Signed)
 Patient called reporting he was expecting a call today re: his condition. Noted on office note he is to take ferrous sulfate  bid. Ferritin returned at 43.

## 2023-08-04 NOTE — Telephone Encounter (Signed)
-----   Message from Diana Forster sent at 08/03/2023  3:41 PM EDT ----- Please call him-ferritin (measure of iron  stores) is lower but remains in normal range.  Continue oral iron .  Follow-up as scheduled in 2 months.  Call with recurrent symptoms of anemia.

## 2023-08-04 NOTE — Telephone Encounter (Signed)
 Patient gave verbal understanding and had no further questions at this time.

## 2023-08-04 NOTE — Telephone Encounter (Signed)
 Per nurse w/Lisa Andy Bannister, this has been taken care of today.

## 2023-08-08 ENCOUNTER — Telehealth: Payer: Self-pay | Admitting: Pharmacist

## 2023-08-08 DIAGNOSIS — E1165 Type 2 diabetes mellitus with hyperglycemia: Secondary | ICD-10-CM

## 2023-08-08 NOTE — Progress Notes (Signed)
   08/08/2023  Patient ID: Randy Jacobson, male   DOB: 1945-11-27, 78 y.o.   MRN: [id number]   Patient was called regarding referral for medication assistance with Januvia. Unfortunately, he did not answer the phone. HIPAA compliant message was left on the voicemail.  Plan:  Call patient back in 3-5 business days.   Geronimo Krabbe, PharmD, BCACP Clinical Pharmacist [phone]

## 2023-08-10 ENCOUNTER — Telehealth: Payer: Self-pay | Admitting: Pharmacist

## 2023-08-10 DIAGNOSIS — E1169 Type 2 diabetes mellitus with other specified complication: Secondary | ICD-10-CM

## 2023-08-10 NOTE — Progress Notes (Signed)
 08/10/2023 Name: Randy Jacobson MRN: [id number] DOB: September 18, 1945  Chief Complaint  Patient presents with   Medication Assistance    Januvia     Randy Jacobson is a 78 y.o. year old male who presented for a telephone visit.   They were referred to the pharmacist by their PCP for assistance in managing medication access.    Subjective:  Care Team: Primary Care Provider: Duncan Gibson, NP ; Next Scheduled Visit: 01/05/24 Oncology 10/03/23  Medication Access/Adherence  Current Pharmacy:  [facility] [address] [place] - [address] [place] [address] Phone: [phone] Fax: [phone]  [facility] [address] [place] [address] Phone: [phone] Fax: [phone]   Patient reports affordability concerns with their medications: Yes  Januvia Patient reports access/transportation concerns to their pharmacy: No  Patient reports adherence concerns with their medications:  Yes  Cost   Diabetes:  Current medications:  Tresiba  38 units daily Januvia 50 mg daily   Current glucose readings: Uses Freestyle CGM A1c 6.2%  Patient denies hypoglycemic s/sx including  dizziness, shakiness, sweating. Patient denies hyperglycemic symptoms including  polyuria, polydipsia, polyphagia, nocturia, neuropathy, blurred vision.   Hypertension: Losartan  50 mg 1 tablet daily  Hyperlipidemia/ASCVD Risk Reduction  Current lipid lowering medications:  Atorvastatin  40 mg 1 tablet daily   Antiplatelet regimen:   ASCVD History:  Family History:  Risk Factors:    Clinical ASCVD: Yes  The ASCVD Risk score (Arnett DK, et al., 2019) failed to calculate for the following reasons:   The valid total cholesterol range is 130 to 320 mg/dL       Objective:  Lab Results  Component Value Date   HGBA1C 6.2 (H) 03/18/2023    Lab Results  Component Value Date   CREATININE 0.98 06/24/2023   BUN 17  06/24/2023   NA 134 (L) 06/24/2023   K 3.9 06/24/2023   CL 102 06/24/2023   CO2 22 06/24/2023    Lab Results  Component Value Date   CHOL 70 06/24/2023   HDL 24 (L) 06/24/2023   LDLCALC 31 06/24/2023   TRIG 74 06/24/2023   CHOLHDL 2.9 06/24/2023    Medications Reviewed Today     Reviewed by Geronimo Krabbe, RPH (Pharmacist) on 08/10/23 at 1104  Med List Status: <None>   Medication Order Taking? Sig Documenting Provider Last Dose Status Informant  Ascorbic Acid (VITAMIN C ) 1000 MG tablet [id number] Yes Take 1 tablet (1,000 mg total) by mouth daily. Medina-Vargas, Monina C, NP Taking Active Self, Pharmacy Records  aspirin  EC 81 MG tablet [id number] Yes Take 1 tablet (81 mg total) by mouth daily at 6 (six) AM. Swallow whole. Ron Cobbs, PA-C Taking Active Self, Pharmacy Records  atorvastatin  (LIPITOR) 40 MG tablet [id number] Yes Take 40 mg by mouth daily. [provider] Taking Active Self, Pharmacy Records  clopidogrel  (PLAVIX ) 75 MG tablet [id number] Yes Take 1 tablet (75 mg total) by mouth daily. Medina-Vargas, Monina C, NP Taking Active Self, Pharmacy Records  Continuous Glucose Receiver (FREESTYLE LIBRE 2 READER) DEVI [id number] Yes 1 Device by Does not apply route 3 (three) times daily. E11.69 Medina-Vargas, Monina C, NP Taking Active Self, Pharmacy Records  Continuous Glucose Sensor (FREESTYLE LIBRE 2 SENSOR) [place] [id number] Yes 2 Devices by Does not apply route daily. E11.69 Medina-Vargas, Monina C, NP Taking Active Self, Pharmacy Records  ferrous sulfate  325 (65 FE) MG tablet [id number] Yes Take 1 tablet (  325 mg total) by mouth daily with breakfast.  Patient taking differently: Take 325 mg by mouth in the morning and at bedtime.   Luna Salinas, MD Taking Active Self, Pharmacy Records           Med Note [place] [facility]   Thu Jun 16, 2023  4:27 PM)    hydrochlorothiazide  (HYDRODIURIL ) 25 MG tablet [id number] Yes Take 0.5 tablets (12.5 mg total) by mouth daily.  Medina-Vargas, Monina C, NP Taking Active   losartan  (COZAAR ) 50 MG tablet [id number] Yes Take 1 tablet (50 mg total) by mouth in the morning and at bedtime. Medina-Vargas, Monina C, NP Taking Active   Melatonin 10 MG TABS [id number] Yes Take 10 mg by mouth at bedtime. [provider] Taking Active Self, Pharmacy Records  metFORMIN  (GLUCOPHAGE ) 1000 MG tablet [id number] Yes Take 1 tablet (1,000 mg total) by mouth 2 (two) times daily with a meal. Medina-Vargas, Monina C, NP Taking Active Self, Pharmacy Records  pantoprazole  (PROTONIX ) 40 MG tablet [id number] Yes Take 40 mg by mouth daily. [provider] Taking Active Self, Pharmacy Records  sitaGLIPtin (JANUVIA) 100 MG tablet [id number] Yes Take 50 mg by mouth daily as needed (high BS). [provider] Taking Active Self, Pharmacy Records           Med Note (CRUTHIS, CHLOE C   Fri Jun 24, 2023 12:51 PM) Pt buys this medication from [place].   TRESIBA  FLEXTOUCH 100 UNIT/ML FlexTouch Pen [id number] Yes Inject 38 Units into the skin daily. Medina-Vargas, Monina C, NP Taking Active               Assessment/Plan:   Diabetes: - Currently controlled A1c 6.2% - Reviewed long term cardiovascular and renal outcomes of uncontrolled blood sugar - Reviewed goal A1c, goal fasting, and goal 2 hour post prandial glucose - - Recommend to continue current therapy   - Meets financial criteria for Januvia and Tresiba  patient assistance programs through [employer] and [employer]. Will collaborate with provider, CPhT, and patient to pursue assistance.     Follow Up Plan:    Route note to RX Medication Assistance Team Send note to Provider letting her know to be on the look-out for forms to sign Follow up on medication assistance progress in 3-4 weeks. Patient may need to provide a LIS/Extra Help denial letter. (Application was completed today online with Patient on the phone)   Geronimo Krabbe, PharmD, [facility] Clinical  Pharmacist [phone]

## 2023-08-12 ENCOUNTER — Telehealth: Payer: Self-pay

## 2023-08-12 NOTE — Telephone Encounter (Signed)
 PAP: Patient assistance application for Januvia through [employer] has been mailed to pt's home address on file. Provider portion of application will be faxed to provider's office. PAP for Tresiba  has been faxed to Providers office and Patient portion e-filed with [employer].

## 2023-08-15 ENCOUNTER — Other Ambulatory Visit: Payer: Self-pay | Admitting: Adult Health

## 2023-08-15 ENCOUNTER — Other Ambulatory Visit: Payer: Self-pay | Admitting: *Deleted

## 2023-08-15 MED ORDER — TRESIBA FLEXTOUCH 100 UNIT/ML ~~LOC~~ SOPN: 38.0000 [IU] | PEN_INJECTOR | Freq: Every day | SUBCUTANEOUS | 5 refills | Status: AC

## 2023-08-15 MED ORDER — ATORVASTATIN CALCIUM 40 MG PO TABS: 40.0000 mg | ORAL_TABLET | Freq: Every day | ORAL | 0 refills | Status: DC

## 2023-08-15 NOTE — Telephone Encounter (Signed)
 Pharmacy requested refills

## 2023-08-19 ENCOUNTER — Ambulatory Visit: Payer: Self-pay

## 2023-08-19 NOTE — Telephone Encounter (Signed)
 Noted.

## 2023-08-19 NOTE — Telephone Encounter (Signed)
 Copied from CRM [id number]. Topic: Clinical - Red Word Triage >> Aug 19, 2023  8:48 AM Blair Bumpers wrote: Red Word that prompted transfer to Nurse Triage: Patient states he's a diabetic but has an abrasion on his right foot that he thinks may be infected. He wanted to be seen today with pcp but she does not have availability until Monday, 08/22/23.  Chief Complaint: abrasion to outside of right foot caused by shoe, drainage clear, size of nickel Symptoms: pain,  Frequency: for a week Pertinent Negatives: Patient denies difficulty walking, fever Disposition: [] ED /[x] Urgent Care (no appt availability in office) / [] Appointment(In office/virtual)/ []  [facility]/ [] Home Care/ [] Refused Recommended Disposition /[] [facility]/ []  Follow-up with PCP Additional Notes: instructed to go to uc. Care advice given, denies questions; instructed to go to ER if becomes worse.   Reason for Disposition  [1] SEVERE pain AND [2] not improved 2 hours after pain medicine/ice packs  Answer Assessment - Initial Assessment Questions 1. MECHANISM: "How did the injury happen?" (e.g., twisting injury, direct blow)      Shoe rubbing on right foot abrasion out side of foot 2. ONSET: "When did the injury happen?" (Minutes or hours ago)      About a week ago 3. LOCATION: "Where is the injury located?"      See above 4. APPEARANCE of INJURY: "What does the injury look like?"      Looks infected, red, swollen, drainage is clear 5. WEIGHT-BEARING: "Can you put weight on that foot?" "Can you walk (four steps or more)?"       yes 6. SIZE: For cuts, bruises, or swelling, ask: "How large is it?" (e.g., inches or centimeters;  entire joint)      nickle 7. PAIN: "Is there pain?" If Yes, ask: "How bad is the pain?"    (e.g., Scale 1-10; or mild, moderate, severe)   - NONE (0): no pain.   - MILD (1-3): doesn't interfere with normal activities.    - MODERATE (4-7): interferes with normal activities (e.g.,  work or school) or awakens from sleep, limping.    - SEVERE (8-10): excruciating pain, unable to do any normal activities, unable to walk.      Woke him up this am 8. TETANUS: For any breaks in the skin, ask: "When was the last tetanus booster?"     no 9. OTHER SYMPTOMS: "Do you have any other symptoms?"      no 10. PREGNANCY: "Is there any chance you are pregnant?" "When was your last menstrual period?"       na  Protocols used: Ankle and Foot Injury-A-AH

## 2023-08-19 NOTE — Telephone Encounter (Signed)
 Patient going to Urgent Care per E2C2 Triage.   FYI

## 2023-08-19 NOTE — Telephone Encounter (Signed)
 Received provider portion for Januvia [employer]) will follow up with patient for their part of application.

## 2023-08-22 ENCOUNTER — Encounter: Payer: Self-pay | Admitting: Emergency Medicine

## 2023-08-22 ENCOUNTER — Other Ambulatory Visit: Payer: Self-pay

## 2023-08-22 ENCOUNTER — Ambulatory Visit (INDEPENDENT_AMBULATORY_CARE_PROVIDER_SITE_OTHER)

## 2023-08-22 ENCOUNTER — Ambulatory Visit
Admission: EM | Admit: 2023-08-22 | Discharge: 2023-08-22 | Disposition: A | Discharge status: Home / Self Care | Attending: Physician Assistant | Admitting: Physician Assistant

## 2023-08-22 DIAGNOSIS — L97519 Non-pressure chronic ulcer of other part of right foot with unspecified severity: Secondary | ICD-10-CM

## 2023-08-22 DIAGNOSIS — M7989 Other specified soft tissue disorders: Secondary | ICD-10-CM | POA: Diagnosis not present

## 2023-08-22 DIAGNOSIS — M869 Osteomyelitis, unspecified: Secondary | ICD-10-CM

## 2023-08-22 MED ORDER — CEPHALEXIN 500 MG PO CAPS
500.0000 mg | ORAL_CAPSULE | Freq: Four times a day (QID) | ORAL | 0 refills | Status: DC
Start: 2023-08-22 — End: 2023-10-12

## 2023-08-22 NOTE — Discharge Instructions (Addendum)
 Follow up with podiatry in 2 days. Go to ED with worsening symptoms

## 2023-08-22 NOTE — ED Provider Notes (Signed)
 [facility]    CSN: [id number] Arrival date & time: 08/22/23  1117      History   Chief Complaint Chief Complaint  Patient presents with   Foot Pain    HPI Randy Jacobson is a 78 y.o. male.   Patient here concerned with "foot infection" x R foot x last week.  He has history of DM, neuropathy, PAD. He states wound got infected rubbing against his shoe.  Admits pain, tenderness, swelling, discharge, arthralgia.  He has taken OTC tylenol  w/o relief.  He has appointment with podiatry in 2 days.    Past Medical History:  Diagnosis Date   Diabetes mellitus without complication (HCC)    Dysrhythmia    chronic RBBB; bradycardia s/p atropine  following sheath removal, 3.8 second sinus pause 10/11/22   Hypertension    Peripheral artery disease [employer])     Patient Active Problem List   Diagnosis Date Noted   History of embolectomy 06/23/2023   Near syncope 01/19/2023   Fecal occult blood test positive 01/19/2023   Anemia 01/19/2023   Orthostatic hypotension 01/16/2023   Gastroduodenitis 09/01/2021   History of adenomatous polyp of colon 09/01/2021   History of anemia 09/01/2021   Iron  deficiency anemia due to chronic blood loss 09/01/2021   Cough 01/13/2021   History of peptic ulcer 09/04/2020   Insomnia 09/04/2020   Iron  deficiency 09/04/2020   Proteinuria 09/04/2020   White blood cell abnormality 09/04/2020   Hyperglycemia due to type 2 diabetes mellitus (HCC) 09/01/2020   GI bleeding 04/08/2017   Diabetes mellitus, type 2 (HCC) 12/16/2015   Sepsis (HCC) 12/16/2015   Hyperlipidemia 12/16/2015   Hypertension 12/16/2015   Diarrhea    Febrile illness    Enteric campylobacteriosis    Claudication of right lower extremity (HCC)    PAD (peripheral artery disease) (HCC)     Past Surgical History:  Procedure Laterality Date   "stents in both my legs"     ABDOMINAL AORTOGRAM W/LOWER EXTREMITY N/A 10/11/2022   Procedure: ABDOMINAL AORTOGRAM W/LOWER EXTREMITY;   Surgeon: Dannis Dy, MD;  Location: [facility] INVASIVE CV LAB;  Service: Cardiovascular;  Laterality: N/A;   ABDOMINAL AORTOGRAM W/LOWER EXTREMITY Left 06/20/2023   Procedure: ABDOMINAL AORTOGRAM W/LOWER EXTREMITY;  Surgeon: Philipp Brawn, MD;  Location: [facility] INVASIVE CV LAB;  Service: Cardiovascular;  Laterality: Left;   COLONOSCOPY WITH PROPOFOL  N/A 01/18/2023   Procedure: COLONOSCOPY WITH PROPOFOL ;  Surgeon: Genell Ken, MD;  Location: [facility] ENDOSCOPY;  Service: Gastroenterology;  Laterality: N/A;   ENDARTERECTOMY FEMORAL Left 10/15/2022   Procedure: ENDARTERECTOMY OF LEFT ILIOFEMORAL ARTERY;  Surgeon: Dannis Dy, MD;  Location: [facility] OR;  Service: Vascular;  Laterality: Left;   ENDARTERECTOMY FEMORAL Right 06/23/2023   Procedure: COMMON FEMORAL ENDARTERECTOMY;  Surgeon: Philipp Brawn, MD;  Location: [facility] OR;  Service: Vascular;  Laterality: Right;   ESOPHAGOGASTRODUODENOSCOPY (EGD) WITH PROPOFOL  N/A 01/18/2023   Procedure: ESOPHAGOGASTRODUODENOSCOPY (EGD) WITH PROPOFOL ;  Surgeon: Genell Ken, MD;  Location: [facility] ENDOSCOPY;  Service: Gastroenterology;  Laterality: N/A;   FEMORAL-POPLITEAL BYPASS GRAFT Left 10/15/2022   Procedure: LEFT FEMORAL-BELOW KNEE POPLITEAL ARTERY BYPASS WITH VEIN HARVEST OF LEFT GREATER SAPHENOUS VEIN;  Surgeon: Dannis Dy, MD;  Location: [facility] OR;  Service: Vascular;  Laterality: Left;   GIVENS CAPSULE STUDY N/A 04/10/2017   Procedure: GIVENS CAPSULE STUDY;  Surgeon: Baldo Bonds, MD;  Location: [facility] ENDOSCOPY;  Service: Endoscopy;  Laterality: N/A;   GIVENS CAPSULE STUDY N/A 01/18/2023   Procedure: GIVENS CAPSULE  STUDY;  Surgeon: Genell Ken, MD;  Location: [facility] ENDOSCOPY;  Service: Gastroenterology;  Laterality: N/A;   INSERTION OF ILIAC STENT Right 06/23/2023   Procedure: INSERTION, STENT, ARTERY, ILIAC;  Surgeon: Philipp Brawn, MD;  Location: [facility] OR;  Service: Vascular;  Laterality: Right;   IR GENERIC HISTORICAL  08/26/2015   IR RADIOLOGIST EVAL & MGMT  08/26/2015 Fernando Hoyer, MD [facility] INTERV RAD   IR GENERIC HISTORICAL  04/21/2016   IR RADIOLOGIST EVAL & MGMT 04/21/2016 Fernando Hoyer, MD [facility] INTERV RAD   LOWER EXTREMITY ANGIOGRAM Right 06/23/2023   Procedure: [employer], LOWER EXTREMITY;  Surgeon: Philipp Brawn, MD;  Location: [facility] OR;  Service: Vascular;  Laterality: Right;   LOWER EXTREMITY INTERVENTION Left 06/20/2023   Procedure: LOWER EXTREMITY INTERVENTION;  Surgeon: Philipp Brawn, MD;  Location: [facility] INVASIVE CV LAB;  Service: Cardiovascular;  Laterality: Left;  DCB   PATCH ANGIOPLASTY Left 10/15/2022   Procedure: PATCH ANGIOPLASTY USING XENOSURE BIOLOGIC PATCH 1CMX14CM;  Surgeon: Dannis Dy, MD;  Location: [facility] OR;  Service: Vascular;  Laterality: Left;   PATCH ANGIOPLASTY Right 06/23/2023   Procedure: ANGIOPLASTY, USING PATCH GRAFT 6CM Corinna Dickens;  Surgeon: Philipp Brawn, MD;  Location: [facility] OR;  Service: Vascular;  Laterality: Right;   PERIPHERAL VASCULAR INTERVENTION  10/11/2022   Procedure: PERIPHERAL VASCULAR INTERVENTION;  Surgeon: Dannis Dy, MD;  Location: [facility] INVASIVE CV LAB;  Service: Cardiovascular;;       Home Medications    Prior to Admission medications   Medication Sig Start Date End Date Taking? Authorizing Provider  cephALEXin  (KEFLEX ) 500 MG capsule Take 1 capsule (500 mg total) by mouth 4 (four) times daily. 08/22/23  Yes Lavonia Powers, PA-C  Ascorbic Acid (VITAMIN C ) 1000 MG tablet Take 1 tablet (1,000 mg total) by mouth daily. 05/18/23   Medina-Vargas, Monina C, NP  aspirin  EC 81 MG tablet Take 1 tablet (81 mg total) by mouth daily at 6 (six) AM. Swallow whole. 10/19/22   Schuh, McKenzi P, PA-C  atorvastatin  (LIPITOR) 40 MG tablet Take 1 tablet (40 mg total) by mouth daily. 08/15/23   Medina-Vargas, Monina C, NP  clopidogrel  (PLAVIX ) 75 MG tablet Take 1 tablet (75 mg total) by mouth daily. 05/18/23   Medina-Vargas, Monina C, NP  Continuous Glucose Receiver (FREESTYLE LIBRE 2 READER) DEVI 1  Device by Does not apply route 3 (three) times daily. E11.69 04/18/23   Medina-Vargas, Monina C, NP  Continuous Glucose Sensor (FREESTYLE LIBRE 2 SENSOR) MISC 2 Devices by Does not apply route daily. E11.69 04/18/23   Medina-Vargas, Monina C, NP  ferrous sulfate  325 (65 FE) MG tablet Take 1 tablet (325 mg total) by mouth daily with breakfast. Patient taking differently: Take 325 mg by mouth in the morning and at bedtime. 01/20/23   Amin, Sumayya, MD  hydrochlorothiazide  (HYDRODIURIL ) 25 MG tablet Take 0.5 tablets (12.5 mg total) by mouth daily. 08/04/23   Medina-Vargas, Monina C, NP  losartan  (COZAAR ) 50 MG tablet Take 1 tablet (50 mg total) by mouth in the morning and at bedtime. 07/19/23   Medina-Vargas, Monina C, NP  Melatonin 10 MG TABS Take 10 mg by mouth at bedtime.    [provider]  metFORMIN  (GLUCOPHAGE ) 1000 MG tablet Take 1 tablet (1,000 mg total) by mouth 2 (two) times daily with a meal. 03/29/23   Medina-Vargas, Monina C, NP  pantoprazole  (PROTONIX ) 40 MG tablet Take 40 mg by mouth daily. 09/02/21   [provider]  sitaGLIPtin (JANUVIA) 100 MG  tablet Take 50 mg by mouth daily as needed (high BS).    [provider]  TRESIBA  FLEXTOUCH 100 UNIT/ML FlexTouch Pen Inject 38 Units into the skin daily. 08/15/23   Medina-Vargas, Monina C, NP    Family History Family History  Problem Relation Age of Onset   Diabetes Brother     Social History Social History   Tobacco Use   Smoking status: Former    Current packs/day: 0.00    Average packs/day: 1 pack/day for 36.0 years (36.0 ttl pk-yrs)    Types: Cigarettes    Start date: 11/20/1957    Quit date: 11/20/1993    Years since quitting: 29.7   Smokeless tobacco: Never  Vaping Use   Vaping status: Never Used  Substance Use Topics   Alcohol use: Yes    Alcohol/week: 0.0 standard drinks of alcohol    Comment: occ   Drug use: Never     Allergies   Patient has no known allergies.   Review of Systems Review of  Systems  Constitutional:  Negative for chills, fatigue and fever.  Gastrointestinal:  Negative for abdominal pain, constipation, nausea and vomiting.  Musculoskeletal:  Positive for arthralgias, gait problem and myalgias. Negative for joint swelling.  Skin:  Positive for color change and wound.  Neurological:  Positive for weakness. Negative for headaches.  Hematological:  Negative for adenopathy. Does not bruise/bleed easily.  Psychiatric/Behavioral:  Negative for sleep disturbance.      Physical Exam Triage Vital Signs ED Triage Vitals  Encounter Vitals Group     BP 08/22/23 1149 (!) 153/70     Systolic BP Percentile --      Diastolic BP Percentile --      Pulse Rate 08/22/23 1149 98     Resp 08/22/23 1149 20     Temp 08/22/23 1149 98.3 F (36.8 C)     Temp Source 08/22/23 1149 Oral     SpO2 08/22/23 1149 95 %     Weight --      Height --      Head Circumference --      Peak Flow --      Pain Score 08/22/23 1148 8     Pain Loc --      Pain Education --      Exclude from Growth Chart --    No data found.  Updated Vital Signs BP (!) 153/70 (BP Location: Right Arm)   Pulse 98   Temp 98.3 F (36.8 C) (Oral)   Resp 20   SpO2 95%   Visual Acuity Right Eye Distance:   Left Eye Distance:   Bilateral Distance:    Right Eye Near:   Left Eye Near:    Bilateral Near:     Physical Exam Vitals and nursing note reviewed.  Constitutional:      General: He is not in acute distress.    Appearance: He is well-developed.  HENT:     Head: Normocephalic and atraumatic.  Eyes:     Extraocular Movements: Extraocular movements intact.     Conjunctiva/sclera: Conjunctivae normal.  Pulmonary:     Effort: Pulmonary effort is normal. No respiratory distress.  Musculoskeletal:        General: No swelling.     Cervical back: Normal range of motion and neck supple.     Comments: 1 cm ulcerative wound lateral aspect R foot at 5th MTP with swelling and surrounding erythema +tender  at 5th MTP  Skin:  General: Skin is warm and dry.     Capillary Refill: Capillary refill takes less than 2 seconds.  Neurological:     Mental Status: He is alert.     Motor: No weakness.     Gait: Gait normal.  Psychiatric:        Mood and Affect: Mood normal.      UC Treatments / Results  Labs (all labs ordered are listed, but only abnormal results are displayed) Labs Reviewed - No data to display  EKG   Radiology DG Foot Complete Right Result Date: 08/22/2023 CLINICAL DATA:  Infection. EXAM: RIGHT FOOT COMPLETE - 3 VIEW COMPARISON:  None Available. FINDINGS: Soft swelling is noted about the fifth MTP joint. A soft tissue ulceration is evident. Lytic changes are noted in the lateral cortical margin subjacent to the ulceration. No other focal osseous changes are present. Microvascular calcifications are present. No other focal soft tissue inflammatory changes are present. IMPRESSION: Soft tissue ulceration about the fifth MTP joint with lytic changes in the lateral cortical margin subjacent to the ulceration. Findings are concerning for osteomyelitis. Electronically Signed   By: Audree Leas M.D.   On: 08/22/2023 13:36    Procedures Procedures (including critical care time)  Medications Ordered in UC Medications - No data to display  Initial Impression / Assessment and Plan / UC Course  I have reviewed the triage vital signs and the nursing notes.  Pertinent labs & imaging results that were available during my care of the patient were reviewed by me and considered in my medical decision making (see chart for details).     Ulcer R foot, start antibiotics Follow up with podiatry Xray w/o sign of osteo on wet read, will contact if radiologist notes changes not discussed during visit.  Radiologist notes possible osteomyelitis.  I called patient, ID verified (Last name, DOB), advised him to go to ED for further evaluation of osteomyelitis.  Patient expressed  understanding.  Final Clinical Impressions(s) / UC Diagnoses   Final diagnoses:  Ulcer of right foot, unspecified ulcer stage (HCC)  Osteomyelitis of fifth toe of right foot [facility])     Discharge Instructions      Follow up with podiatry in 2 days. Go to ED with worsening symptoms   ED Prescriptions     Medication Sig Dispense Auth. Provider   cephALEXin  (KEFLEX ) 500 MG capsule Take 1 capsule (500 mg total) by mouth 4 (four) times daily. 40 capsule Lavonia Powers, PA-C      PDMP not reviewed this encounter.   Roddrick, Rutigliano, PA-C 08/22/23 1401

## 2023-08-22 NOTE — ED Triage Notes (Addendum)
 Pt reports "foot infection" on right foot x1 week. Pt reports is going to podiatrist this week but reports increase in pain and states " I couldn't take it anymore." hx of diabetes.

## 2023-08-23 ENCOUNTER — Other Ambulatory Visit: Payer: Self-pay | Admitting: Adult Health

## 2023-08-23 ENCOUNTER — Ambulatory Visit (HOSPITAL_COMMUNITY): Payer: Self-pay

## 2023-08-23 NOTE — Telephone Encounter (Signed)
 Received Patient portion of PAP application for Januvia [employer]) with income document.

## 2023-08-23 NOTE — Telephone Encounter (Signed)
 PAP: Application for Januvia has been submitted to [employer], via fax with income document and insurance information.

## 2023-08-24 ENCOUNTER — Telehealth: Payer: Self-pay

## 2023-08-24 DIAGNOSIS — E1151 Type 2 diabetes mellitus with diabetic peripheral angiopathy without gangrene: Secondary | ICD-10-CM | POA: Diagnosis not present

## 2023-08-24 DIAGNOSIS — M79674 Pain in right toe(s): Secondary | ICD-10-CM | POA: Diagnosis not present

## 2023-08-24 DIAGNOSIS — M79671 Pain in right foot: Secondary | ICD-10-CM | POA: Diagnosis not present

## 2023-08-24 DIAGNOSIS — L89893 Pressure ulcer of other site, stage 3: Secondary | ICD-10-CM | POA: Diagnosis not present

## 2023-08-24 DIAGNOSIS — E1140 Type 2 diabetes mellitus with diabetic neuropathy, unspecified: Secondary | ICD-10-CM | POA: Diagnosis not present

## 2023-08-24 NOTE — Telephone Encounter (Signed)
 [employer] called requesting information regarding pt's visit on 08/22/2023. Pt was identified by two pt identifiers, information requested was provided. They also requested that we send over note from pt's visit, and was informed that they would need to reach out to medical records for the note from the visit on 08/22/2023. Understanding was verbalized.

## 2023-08-30 NOTE — Telephone Encounter (Signed)
 PAP: Application for Randy Jacobson has been submitted to [employer], via fax

## 2023-08-31 DIAGNOSIS — M79674 Pain in right toe(s): Secondary | ICD-10-CM | POA: Diagnosis not present

## 2023-08-31 DIAGNOSIS — M79671 Pain in right foot: Secondary | ICD-10-CM | POA: Diagnosis not present

## 2023-08-31 DIAGNOSIS — L89893 Pressure ulcer of other site, stage 3: Secondary | ICD-10-CM | POA: Diagnosis not present

## 2023-08-31 DIAGNOSIS — E1140 Type 2 diabetes mellitus with diabetic neuropathy, unspecified: Secondary | ICD-10-CM | POA: Diagnosis not present

## 2023-08-31 NOTE — Telephone Encounter (Signed)
 Followed up on status at [employer] for Januvia and a Research scientist (medical) was mailed out.  Spoke to patient and he received the letter and threw it away.  I called [employer] back and explained situation and another letter will be mailed out to his home address.

## 2023-09-01 NOTE — Progress Notes (Signed)
  Pharmacy Medication Assistance Program Note    09/01/2023  Patient ID: QUY LOTTS, male   DOB: 08-04-1945, 78 y.o.   MRN: [id number]     09/01/2023  Outreach Medication One  Manufacturer Medication One Novo Nordisk  [employer] [facility] Tresiba   Type of Radiographer, therapeutic Assistance  Date Application Sent to Patient 08/12/2023  Application Items Requested Application  Date Application Sent to Prescriber 08/12/2023  Name of Prescriber Inge Mangle, NP  Date Application Received From Patient 08/12/2023  Application Items Received From Patient Application  Date Application Received From Provider 08/30/2023  Date Application Submitted to Manufacturer 08/30/2023  Method Application Sent to Manufacturer Fax  Patient Assistance Determination Approved  Patient Notification Method Telephone Call  Telephone Call Outcome Successful

## 2023-09-01 NOTE — Telephone Encounter (Signed)
 PAP: Patient assistance application for Tresiba  has been approved by PAP Companies: [employer] from 08/31/2023 to 04/11/2024. Medication should be delivered to PAP Delivery: Provider's office. For further shipping updates, please contact [employer] at [phone]. Patient ID is: [id number]

## 2023-09-02 ENCOUNTER — Ambulatory Visit: Payer: Self-pay

## 2023-09-02 NOTE — Telephone Encounter (Signed)
 See Triage Notes from Triage Nurse FYI.  Message sent to Medina-Vargas, Margit Banda, NP

## 2023-09-02 NOTE — Telephone Encounter (Signed)
 Chief Complaint: foot pain Symptoms: foot pain  Frequency: months Pertinent Negatives: Patient denies  Disposition: [] ED /[] Urgent Care (no appt availability in office) / [x] Appointment(In office/virtual)/ []  [place] [facility]/ [] Home Care/ [] Refused Recommended Disposition /[]  Mobile Bus/ []  Follow-up with PCP Additional Notes: pt states that his experiencing pain in his feet. States this has been going on for a long time and he had been just dealing with it. States that it is becoming more difficult to walk due to the pain.  States pain has gotten worse over the last 3-4 weeks. States that bottom of his feet burn and they sting.   Copied from CRM [id number]. Topic: Clinical - Red Word Triage >> Sep 02, 2023  3:36 PM Tiffany H wrote: Reason for CRM: Patient called to advise that the neuropathy in his feet has gotten painful. Patient tried to get advice for pain relief from pharmacy, but he was directed to us . Please assist. Reason for Disposition  [1] Tingling in feet AND [2] new or increased  Answer Assessment - Initial Assessment Questions 1. ONSET: "When did the pain start?"      A while ago 2. LOCATION: "Where is the pain located?"      foot 3. PAIN: "How bad is the pain?"    (Scale 1-10; or mild, moderate, severe)  - MILD (1-3): doesn't interfere with normal activities.   - MODERATE (4-7): interferes with normal activities (e.g., work or school) or awakens from sleep, limping.   - SEVERE (8-10): excruciating pain, unable to do any normal activities, unable to walk.      severe 4. WORK OR EXERCISE: "Has there been any recent work or exercise that involved this part of the body?"      no 5. CAUSE: "What do you think is causing the foot pain?"     neuropathy 6. OTHER SYMPTOMS: "Do you have any other symptoms?" (e.g., leg pain, rash, fever, numbness) Numbness in feet  Protocols used: Foot Pain-A-AH

## 2023-09-04 NOTE — Telephone Encounter (Signed)
 Patient needs to be seen in the clinic to be evaluated.

## 2023-09-06 NOTE — Telephone Encounter (Signed)
 Message routed to [facility] staff. Please call patient and schedule for appointment.

## 2023-09-06 NOTE — Telephone Encounter (Signed)
 McGill, Wendie Hamburg, Jasmine E, CMA; [facility] Clinical14 minutes ago (10:33 AM)   AM The patient has an appointment on 05/29. Called and left a detailed voicemail to call back if he would like to come in sooner.

## 2023-09-07 ENCOUNTER — Other Ambulatory Visit: Payer: Self-pay | Admitting: Adult Health

## 2023-09-07 ENCOUNTER — Ambulatory Visit (INDEPENDENT_AMBULATORY_CARE_PROVIDER_SITE_OTHER): Admitting: Family

## 2023-09-07 ENCOUNTER — Encounter: Payer: Self-pay | Admitting: Family

## 2023-09-07 VITALS — BP 138/72 | HR 90 | Temp 97.9°F | Resp 18 | Ht 71.0 in | Wt 181.2 lb

## 2023-09-07 DIAGNOSIS — I739 Peripheral vascular disease, unspecified: Secondary | ICD-10-CM | POA: Diagnosis not present

## 2023-09-07 DIAGNOSIS — L97501 Non-pressure chronic ulcer of other part of unspecified foot limited to breakdown of skin: Secondary | ICD-10-CM

## 2023-09-07 DIAGNOSIS — E08621 Diabetes mellitus due to underlying condition with foot ulcer: Secondary | ICD-10-CM

## 2023-09-07 DIAGNOSIS — E1142 Type 2 diabetes mellitus with diabetic polyneuropathy: Secondary | ICD-10-CM

## 2023-09-07 MED ORDER — GABAPENTIN 100 MG PO CAPS: 100.0000 mg | ORAL_CAPSULE | Freq: Three times a day (TID) | ORAL | 3 refills | Status: DC

## 2023-09-07 NOTE — Telephone Encounter (Signed)
 Copied from CRM [id number]. Topic: Clinical - Medication Refill >> Sep 07, 2023  2:22 PM Tiffany H wrote: Medication: pantoprazole  (PROTONIX ) 40 MG tablet [[id number]]  Has the patient contacted their pharmacy? Yes (Agent: If no, request that the patient contact the pharmacy for the refill. If patient does not wish to contact the pharmacy document the reason why and proceed with request.) (Agent: If yes, when and what did the pharmacy advise?)  This is the patient's preferred pharmacy:  [facility] [address] [place] - [address] [place] [address] Phone: [phone] Fax: [phone]   Is this the correct pharmacy for this prescription? Yes If no, delete pharmacy and type the correct one.   Has the prescription been filled recently? No  Is the patient out of the medication? Yes. Patient has recurrent heartburn. Please expedite if possible.   Has the patient been seen for an appointment in the last year OR does the patient have an upcoming appointment? No  Can we respond through MyChart? Yes  Agent: Please be advised that Rx refills may take up to 3 business days. We ask that you follow-up with your pharmacy.

## 2023-09-07 NOTE — Progress Notes (Addendum)
 Provider: Christean Courts FNP-C  Medina-Vargas, Monina C, NP  Patient Care Team: Duncan Gibson, NP as PCP - General (Internal Medicine)  Extended Emergency Contact Information Primary Emergency ContactGenevieve Kerry [place] [address] [place] Home Phone: [phone] Mobile Phone: [phone] Relation: Significant other Secondary Emergency Contact: Hood,Sagar Mobile Phone: [phone] Relation: Friend Preferred language: English Interpreter needed? No  Code Status: Full Code  Goals of care: Advanced Directive information    09/07/2023   10:30 AM  Advanced Directives  Does Patient Have a Medical Advance Directive? Yes  Type of Estate agent of [place];Living will  Does patient want to make changes to medical advance directive? No - Patient declined  Copy of Healthcare Power of Attorney in Chart? No - copy requested     Chief Complaint  Patient presents with   Foot pain    Burning / stinging on bottom of feet    Discussed the use of AI scribe software for clinical note transcription with the patient, who gave verbal consent to proceed.  History of Present Illness   Randy Jacobson is a 78 year old male with diabetes who presents with difficulty walking due to foot pain.  He has been experiencing difficulty walking due to foot pain for the past year. The pain is constant and accompanied by numbness and tingling. He has not taken any medication for the pain.  He has a history of diabetes with blood sugar levels typically around 130 mg/dL. He has a wound on his right foot that has been present for a couple of months. He has visited the wound center twice, with visits spaced a week apart, and plans to return in two weeks. He soaks the wound daily in salt water and apple cider vinegar and reports that it seems to be improving. The wound has some drainage but no significant odor.  He has seen a foot doctor recently but  has not been prescribed diabetic shoes, although he attempted to obtain them in the past. He previously took gabapentin  for neuropathy but discontinued it due to side effects experienced by his girlfriend. He is not currently on any medication for neuropathy.  He has a history of smoking and alcohol use but has since quit both. He does not currently use any substances such as alcohol, marijuana, or cocaine. No swelling in the legs.    Past Medical History:  Diagnosis Date   Diabetes mellitus without complication (HCC)    Dysrhythmia    chronic RBBB; bradycardia s/p atropine  following sheath removal, 3.8 second sinus pause 10/11/22   Hypertension    Peripheral artery disease [facility])    Past Surgical History:  Procedure Laterality Date   "stents in both my legs"     ABDOMINAL AORTOGRAM W/LOWER EXTREMITY N/A 10/11/2022   Procedure: ABDOMINAL AORTOGRAM W/LOWER EXTREMITY;  Surgeon: Dannis Dy, MD;  Location: [facility] INVASIVE CV LAB;  Service: Cardiovascular;  Laterality: N/A;   ABDOMINAL AORTOGRAM W/LOWER EXTREMITY Left 06/20/2023   Procedure: ABDOMINAL AORTOGRAM W/LOWER EXTREMITY;  Surgeon: Philipp Brawn, MD;  Location: [facility] INVASIVE CV LAB;  Service: Cardiovascular;  Laterality: Left;   COLONOSCOPY WITH PROPOFOL  N/A 01/18/2023   Procedure: COLONOSCOPY WITH PROPOFOL ;  Surgeon: Genell Ken, MD;  Location: [facility] ENDOSCOPY;  Service: Gastroenterology;  Laterality: N/A;   ENDARTERECTOMY FEMORAL Left 10/15/2022   Procedure: ENDARTERECTOMY OF LEFT ILIOFEMORAL ARTERY;  Surgeon: Dannis Dy, MD;  Location: [facility] OR;  Service: Vascular;  Laterality: Left;   ENDARTERECTOMY FEMORAL Right 06/23/2023   Procedure: COMMON FEMORAL ENDARTERECTOMY;  Surgeon: Philipp Brawn, MD;  Location: [facility] OR;  Service: Vascular;  Laterality: Right;   ESOPHAGOGASTRODUODENOSCOPY (EGD) WITH PROPOFOL  N/A 01/18/2023   Procedure: ESOPHAGOGASTRODUODENOSCOPY (EGD) WITH PROPOFOL ;  Surgeon: Genell Ken, MD;  Location: [facility] ENDOSCOPY;   Service: Gastroenterology;  Laterality: N/A;   FEMORAL-POPLITEAL BYPASS GRAFT Left 10/15/2022   Procedure: LEFT FEMORAL-BELOW KNEE POPLITEAL ARTERY BYPASS WITH VEIN HARVEST OF LEFT GREATER SAPHENOUS VEIN;  Surgeon: Dannis Dy, MD;  Location: [facility] OR;  Service: Vascular;  Laterality: Left;   GIVENS CAPSULE STUDY N/A 04/10/2017   Procedure: GIVENS CAPSULE STUDY;  Surgeon: Baldo Bonds, MD;  Location: [facility] ENDOSCOPY;  Service: Endoscopy;  Laterality: N/A;   GIVENS CAPSULE STUDY N/A 01/18/2023   Procedure: GIVENS CAPSULE STUDY;  Surgeon: Genell Ken, MD;  Location: [facility] ENDOSCOPY;  Service: Gastroenterology;  Laterality: N/A;   INSERTION OF ILIAC STENT Right 06/23/2023   Procedure: INSERTION, STENT, ARTERY, ILIAC;  Surgeon: Philipp Brawn, MD;  Location: [facility] OR;  Service: Vascular;  Laterality: Right;   IR GENERIC HISTORICAL  08/26/2015   IR RADIOLOGIST EVAL & MGMT 08/26/2015 Fernando Hoyer, MD [facility] INTERV RAD   IR GENERIC HISTORICAL  04/21/2016   IR RADIOLOGIST EVAL & MGMT 04/21/2016 Fernando Hoyer, MD [facility] INTERV RAD   LOWER EXTREMITY ANGIOGRAM Right 06/23/2023   Procedure: [employer], LOWER EXTREMITY;  Surgeon: Philipp Brawn, MD;  Location: [facility] OR;  Service: Vascular;  Laterality: Right;   LOWER EXTREMITY INTERVENTION Left 06/20/2023   Procedure: LOWER EXTREMITY INTERVENTION;  Surgeon: Philipp Brawn, MD;  Location: [facility] INVASIVE CV LAB;  Service: Cardiovascular;  Laterality: Left;  DCB   PATCH ANGIOPLASTY Left 10/15/2022   Procedure: PATCH ANGIOPLASTY USING XENOSURE BIOLOGIC PATCH 1CMX14CM;  Surgeon: Dannis Dy, MD;  Location: [facility] OR;  Service: Vascular;  Laterality: Left;   PATCH ANGIOPLASTY Right 06/23/2023   Procedure: ANGIOPLASTY, USING PATCH GRAFT 6CM Corinna Dickens;  Surgeon: Philipp Brawn, MD;  Location: [facility] OR;  Service: Vascular;  Laterality: Right;   PERIPHERAL VASCULAR INTERVENTION  10/11/2022   Procedure: PERIPHERAL VASCULAR INTERVENTION;  Surgeon: Dannis Dy, MD;  Location: [facility] INVASIVE CV LAB;  Service: Cardiovascular;;    No Known Allergies  Outpatient Encounter Medications as of 09/07/2023  Medication Sig   Ascorbic Acid (VITAMIN C ) 1000 MG tablet Take 1 tablet (1,000 mg total) by mouth daily.   aspirin  EC 81 MG tablet Take 1 tablet (81 mg total) by mouth daily at 6 (six) AM. Swallow whole.   atorvastatin  (LIPITOR) 40 MG tablet Take 1 tablet (40 mg total) by mouth daily.   cephALEXin  (KEFLEX ) 500 MG capsule Take 1 capsule (500 mg total) by mouth 4 (four) times daily.   clopidogrel  (PLAVIX ) 75 MG tablet Take 1 tablet (75 mg total) by mouth daily.   Continuous Glucose Receiver (FREESTYLE LIBRE 2 READER) DEVI 1 Device by Does not apply route 3 (three) times daily. E11.69   Continuous Glucose Sensor (FREESTYLE LIBRE 2 SENSOR) MISC 2 Devices by Does not apply route daily. E11.69   ferrous sulfate  325 (65 FE) MG tablet Take 1 tablet (325 mg total) by mouth daily with breakfast. (Patient taking differently: Take 325 mg by mouth in the morning and at bedtime.)   gabapentin  (NEURONTIN ) 100 MG capsule Take 1 capsule (100 mg total) by mouth 3 (three) times daily.   hydrochlorothiazide  (HYDRODIURIL ) 25 MG tablet Take 0.5 tablets (12.5 mg  total) by mouth daily.   losartan  (COZAAR ) 50 MG tablet Take 1 tablet (50 mg total) by mouth in the morning and at bedtime.   Melatonin 10 MG TABS Take 10 mg by mouth at bedtime.   metFORMIN  (GLUCOPHAGE ) 1000 MG tablet Take 1 tablet (1,000 mg total) by mouth 2 (two) times daily with a meal.   TRESIBA  FLEXTOUCH 100 UNIT/ML FlexTouch Pen Inject 38 Units into the skin daily.   pantoprazole  (PROTONIX ) 40 MG tablet Take 40 mg by mouth daily. (Patient not taking: Reported on 09/07/2023)   sitaGLIPtin (JANUVIA) 100 MG tablet Take 50 mg by mouth daily as needed (high BS). (Patient not taking: Reported on 09/07/2023)   No facility-administered encounter medications on file as of 09/07/2023.    Review of Systems  Constitutional:   Negative for appetite change, chills, fatigue, fever and unexpected weight change.  Respiratory:  Negative for cough, chest tightness, shortness of breath and wheezing.   Cardiovascular:  Negative for chest pain, palpitations and leg swelling.  Gastrointestinal:  Negative for abdominal distention, abdominal pain, nausea and vomiting.  Musculoskeletal:  Negative for arthralgias, back pain, gait problem and joint swelling.  Skin:  Positive for wound. Negative for color change, pallor and rash.       Left foot diabetic wound   Neurological:  Positive for numbness. Negative for dizziness, syncope, speech difficulty, weakness, light-headedness and headaches.       Foot pain     Immunization History  Administered Date(s) Administered   Fluad Quad(high Dose 65+) 12/28/2022   H1N1 02/22/2008   Influenza Split 01/08/2008   Influenza-Unspecified 03/13/2015   Moderna Covid-19 Fall Seasonal Vaccine 60yrs & older 12/28/2022   Novel Infuenza-h1n1-09 02/22/2008   PFIZER(Purple Top)SARS-COV-2 Vaccination 06/10/2019, 07/10/2019   Pneumococcal Polysaccharide-23 06/11/2006   RSV,unspecified 03/20/2022   Td 03/13/2003   Zoster Recombinant(Shingrix) 12/08/2017, 03/24/2018   Pertinent  Health Maintenance Due  Topic Date Due   OPHTHALMOLOGY EXAM  Never done   HEMOGLOBIN A1C  09/16/2023   INFLUENZA VACCINE  11/11/2023   FOOT EXAM  05/17/2024   Colonoscopy  Discontinued      06/15/2019    9:13 AM 11/16/2021    2:43 PM 03/17/2023    1:08 PM 05/18/2023   10:00 AM 09/07/2023   10:29 AM  Fall Risk  Falls in the past year?   0 0 0  Was there an injury with Fall?   0 0 0  Fall Risk Category Calculator   0 0 0  (RETIRED) Patient Fall Risk Level Moderate fall risk Low fall risk     Patient at Risk for Falls Due to   No Fall Risks No Fall Risks History of fall(s)  Fall risk Follow up   Falls evaluation completed Falls evaluation completed Falls evaluation completed   Functional Status Survey:    Vitals:    09/07/23 1034  BP: 138/72  Pulse: 90  Resp: 18  Temp: 97.9 F (36.6 C)  SpO2: 95%  Weight: 181 lb 3.2 oz (82.2 kg)  Height: 5\' 11"  (1.803 m)   Body mass index is 25.27 kg/m. Physical Exam  GENERAL: Alert, cooperative, well developed, no acute distress. HEENT: Normocephalic, normal oropharynx, moist mucous membranes. CHEST: Clear to auscultation bilaterally, no wheezes, rhonchi, or crackles. CARDIOVASCULAR: Normal heart rate and rhythm, S1 and S2 normal without murmurs. ABDOMEN: Soft, non-tender, non-distended, without organomegaly, normal bowel sounds. EXTREMITIES: No cyanosis or edema, no swelling on the legs. NEUROLOGICAL: Cranial nerves grossly intact, moves all extremities  without gross motor or sensory deficit, sensation intact on feet bilaterally. SKIN: Wound on right foot measures 1 cm X 1 cm without any signs of infection ,      Labs reviewed: Recent Labs    10/11/22 1626 10/15/22 0634 05/23/23 1353 06/20/23 0544 06/23/23 1526 06/23/23 1906 06/23/23 1948 06/24/23 0345 06/24/23 0835  NA 137   < > 138 138 137 139 138 134*  --   K 4.1   < > 4.3 3.9 4.3 4.1 4.3 3.9  --   CL 106   < > 101 100 100  --   --  102  --   CO2 25   < > 25  --  20*  --   --  22  --   GLUCOSE 265*   < > 127* 104* 81  --   --  182*  --   BUN 16   < > 16 17 14   --   --  17  --   CREATININE 0.80   < > 0.98 1.00 0.81  --   --  1.20 0.98  CALCIUM  8.3*   < > 9.0  --  9.6  --   --  7.9*  --   MG 1.8  --   --   --   --   --   --   --   --    < > = values in this interval not displayed.   Recent Labs    01/16/23 1325 05/18/23 1127 05/23/23 1353 06/23/23 1526  AST 21 18 17  32  ALT 16 15 13 22   ALKPHOS 60  --  78 72  BILITOT 0.5 0.5 0.5 0.7  PROT 6.8 7.4 7.7 7.5  ALBUMIN  3.7  --  4.4 4.4   Recent Labs    06/23/23 1526 06/23/23 1906 06/25/23 0404 06/27/23 0937 08/03/23 1310  WBC 11.0*   < > 14.8* 11.6* 8.0  NEUTROABS 7.3  --   --  8.2* 5.5  HGB 11.4*   < > 7.8* 8.5* 10.0*  HCT  35.7*   < > 23.3* 26.3* 30.3*  MCV 91.1   < > 89.6 91.0 92.9  PLT 282   < > 150 267 274   < > = values in this interval not displayed.   Lab Results  Component Value Date   TSH 0.362 10/11/2022   Lab Results  Component Value Date   HGBA1C 6.2 (H) 03/18/2023   Lab Results  Component Value Date   CHOL 70 06/24/2023   HDL 24 (L) 06/24/2023   LDLCALC 31 06/24/2023   TRIG 74 06/24/2023   CHOLHDL 2.9 06/24/2023    Significant Diagnostic Results in last 30 days:  DG Foot Complete Right Result Date: 08/22/2023 CLINICAL DATA:  Infection. EXAM: RIGHT FOOT COMPLETE - 3 VIEW COMPARISON:  None Available. FINDINGS: Soft swelling is noted about the fifth MTP joint. A soft tissue ulceration is evident. Lytic changes are noted in the lateral cortical margin subjacent to the ulceration. No other focal osseous changes are present. Microvascular calcifications are present. No other focal soft tissue inflammatory changes are present. IMPRESSION: Soft tissue ulceration about the fifth MTP joint with lytic changes in the lateral cortical margin subjacent to the ulceration. Findings are concerning for osteomyelitis. Electronically Signed   By: Audree Leas M.D.   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 Managed with daily salt water and apple cider vinegar soaks, and daily dressings. Following up with a wound center and podiatrist. Advised against air exposure to prevent infection. - Continue daily wound care with salt water and apple cider vinegar soaks - Follow up with the wound center and podiatrist - Advise against air exposure to prevent infection - Monitor for signs of infection such as redness, drainage, or odor  Diabetic neuropathy Chronic diabetic neuropathy with persistent pain, numbness, and tingling in the feet for a year. Gabapentin  was previously  discontinued. Hesitant to use gabapentin  due to adverse effects experienced by his girlfriend but open to cautious use. Discussed potential side effects, including drowsiness, and advised caution while driving. - Prescribe gabapentin  300 mg three times a day, adjust to twice a day or at night if drowsiness occurs - Send prescription to [facility] in [place]  Peripheral artery disease Peripheral artery disease likely secondary to previous smoking, contributing to poor circulation and increased risk of foot ulcers. Not currently using tobacco or alcohol. Discussed benefits of diabetic shoes in protecting feet and preventing further ulcers. - Inquire about diabetic shoes with the podiatrist to help protect feet and prevent further ulcers   Family/ staff Communication: Reviewed plan of care with patient verbalized understanding   Labs/tests ordered: None   Next Appointment: Return if symptoms worsen or fail to improve.   Total time: 25 minutes. Greater than 50% of total time spent doing patient education regarding T2DM,diabetic neuropathy,PAD,health maintenance including symptom/medication management.   Estil Heman, NP

## 2023-09-08 ENCOUNTER — Ambulatory Visit: Admitting: Adult Health

## 2023-09-08 MED ORDER — PANTOPRAZOLE SODIUM 40 MG PO TBEC: 40.0000 mg | DELAYED_RELEASE_TABLET | Freq: Every day | ORAL | 1 refills | Status: DC

## 2023-09-08 NOTE — Telephone Encounter (Signed)
 Patient has request refill on medication that hasn't been refilled by PCP Medina-Vargas, Monina C, NP. Prescription pend and sent to PCP for approval.

## 2023-09-14 DIAGNOSIS — M79674 Pain in right toe(s): Secondary | ICD-10-CM | POA: Diagnosis not present

## 2023-09-14 DIAGNOSIS — M79671 Pain in right foot: Secondary | ICD-10-CM | POA: Diagnosis not present

## 2023-09-14 DIAGNOSIS — L89892 Pressure ulcer of other site, stage 2: Secondary | ICD-10-CM | POA: Diagnosis not present

## 2023-09-14 DIAGNOSIS — E1140 Type 2 diabetes mellitus with diabetic neuropathy, unspecified: Secondary | ICD-10-CM | POA: Diagnosis not present

## 2023-09-14 NOTE — Telephone Encounter (Signed)
 Reached out to Patient regarding [employer] attestation letter and he has received it and will fill out and mail back to [employer] soon.

## 2023-09-16 ENCOUNTER — Telehealth

## 2023-09-16 NOTE — Telephone Encounter (Signed)
 Incoming shipment received: Patient assistance  Tresiba  x 4 boxes and pen needles x 3 boxes pen needles    Left message on voicemail for patient to return call when available

## 2023-09-20 ENCOUNTER — Telehealth: Payer: Self-pay

## 2023-09-20 ENCOUNTER — Telehealth: Payer: Self-pay | Admitting: Adult Health

## 2023-09-20 ENCOUNTER — Other Ambulatory Visit: Payer: Self-pay | Admitting: Adult Health

## 2023-09-20 DIAGNOSIS — E1169 Type 2 diabetes mellitus with other specified complication: Secondary | ICD-10-CM

## 2023-09-20 MED ORDER — SITAGLIPTIN PHOSPHATE 50 MG PO TABS: 50.0000 mg | ORAL_TABLET | Freq: Every day | ORAL | 1 refills | Status: DC | PRN

## 2023-09-20 MED ORDER — SITAGLIPTIN PHOSPHATE 100 MG PO TABS: 50.0000 mg | ORAL_TABLET | Freq: Every day | ORAL | 1 refills | Status: DC | PRN

## 2023-09-20 NOTE — Telephone Encounter (Signed)
 Copied from CRM [id number]. Topic: Clinical - Prescription Issue >> Sep 20, 2023  4:00 PM Madelyne Schiff wrote: Mylinda Asa at [facility] recommends that the mediation  sitaGLIPtin (JANUVIA) 100 MG tablet not be split in half and to just  order the 50MG    Please advise.

## 2023-09-20 NOTE — Telephone Encounter (Signed)
 Copied from CRM [id number]. Topic: Clinical - Medication Refill >> Sep 20, 2023 10:54 AM Carrielelia G wrote: Medication: JANUVIA 100 MG tablet  Has the patient contacted their pharmacy? No (Agent: If no, request that the patient contact the pharmacy for the refill. If patient does not wish to contact the pharmacy document the reason why and proceed with request.) (Agent: If yes, when and what did the pharmacy advise?)  This is the patient's preferred pharmacy:  [facility] [address], [place] - [address]  [place] [address] Phone: [phone] Fax: [phone]   Is this the correct pharmacy for this prescription? Yes If no, delete pharmacy and type the correct one.   Has the prescription been filled recently? No  Is the patient out of the medication? Yes  Has the patient been seen for an appointment in the last year OR does the patient have an upcoming appointment? Yes  Can we respond through MyChart? No  Agent: Please be advised that Rx refills may take up to 3 business days. We ask that you follow-up with your pharmacy.

## 2023-09-20 NOTE — Telephone Encounter (Signed)
 eRx for Januvia 50 mg sent to pharmacy. FYI.

## 2023-09-27 NOTE — Telephone Encounter (Signed)
 PAP: Patient assistance application for Januvia  has been approved by PAP Companies: [employer] from 09/26/2023 to 04/11/2024. Medication should be delivered to PAP Delivery: Home. For further shipping updates, please contact [employer] at [phone]. Patient ID is: not provided  Reached out to patient and told him of Pap application approval and how to re-order the medication after the first auto- shipment which will be received on June 18th, 2025.

## 2023-09-29 DIAGNOSIS — M79674 Pain in right toe(s): Secondary | ICD-10-CM | POA: Diagnosis not present

## 2023-09-29 DIAGNOSIS — E1140 Type 2 diabetes mellitus with diabetic neuropathy, unspecified: Secondary | ICD-10-CM | POA: Diagnosis not present

## 2023-09-29 DIAGNOSIS — M79671 Pain in right foot: Secondary | ICD-10-CM | POA: Diagnosis not present

## 2023-09-29 DIAGNOSIS — L89892 Pressure ulcer of other site, stage 2: Secondary | ICD-10-CM | POA: Diagnosis not present

## 2023-09-29 NOTE — Telephone Encounter (Signed)
 Patient called into the office and was told by the E2C2 agent that medications is ready for pick up

## 2023-10-03 ENCOUNTER — Inpatient Hospital Stay: Attending: Oncology

## 2023-10-03 ENCOUNTER — Inpatient Hospital Stay: Admitting: Nurse Practitioner

## 2023-10-03 DIAGNOSIS — D509 Iron deficiency anemia, unspecified: Secondary | ICD-10-CM | POA: Insufficient documentation

## 2023-10-06 ENCOUNTER — Inpatient Hospital Stay

## 2023-10-06 ENCOUNTER — Inpatient Hospital Stay: Admitting: Nurse Practitioner

## 2023-10-06 ENCOUNTER — Encounter: Payer: Self-pay | Admitting: Nurse Practitioner

## 2023-10-06 ENCOUNTER — Other Ambulatory Visit: Payer: Self-pay | Admitting: Nurse Practitioner

## 2023-10-06 VITALS — BP 124/64 | HR 60 | Temp 98.1°F | Resp 18 | Ht 71.0 in | Wt 180.9 lb

## 2023-10-06 DIAGNOSIS — E611 Iron deficiency: Secondary | ICD-10-CM

## 2023-10-06 DIAGNOSIS — D649 Anemia, unspecified: Secondary | ICD-10-CM

## 2023-10-06 DIAGNOSIS — D509 Iron deficiency anemia, unspecified: Secondary | ICD-10-CM

## 2023-10-06 LAB — CBC WITH DIFFERENTIAL (CANCER CENTER ONLY)
Abs Immature Granulocytes: 0.13 10*3/uL — ABNORMAL HIGH (ref 0.00–0.07)
Basophils Absolute: 0.1 10*3/uL (ref 0.0–0.1)
Basophils Relative: 1 %
Eosinophils Absolute: 0.2 10*3/uL (ref 0.0–0.5)
Eosinophils Relative: 3 %
HCT: 30.4 % — ABNORMAL LOW (ref 39.0–52.0)
Hemoglobin: 10 g/dL — ABNORMAL LOW (ref 13.0–17.0)
Immature Granulocytes: 2 %
Lymphocytes Relative: 16 %
Lymphs Abs: 1.4 10*3/uL (ref 0.7–4.0)
MCH: 31.2 pg (ref 26.0–34.0)
MCHC: 32.9 g/dL (ref 30.0–36.0)
MCV: 94.7 fL (ref 80.0–100.0)
Monocytes Absolute: 1.3 10*3/uL — ABNORMAL HIGH (ref 0.1–1.0)
Monocytes Relative: 15 %
Neutro Abs: 5.6 10*3/uL (ref 1.7–7.7)
Neutrophils Relative %: 63 %
Platelet Count: 269 10*3/uL (ref 150–400)
RBC: 3.21 MIL/uL — ABNORMAL LOW (ref 4.22–5.81)
RDW: 15.9 % — ABNORMAL HIGH (ref 11.5–15.5)
WBC Count: 8.8 10*3/uL (ref 4.0–10.5)
nRBC: 0 % (ref 0.0–0.2)

## 2023-10-06 LAB — FERRITIN: Ferritin: 39 ng/mL (ref 24–336)

## 2023-10-06 LAB — CMP (CANCER CENTER ONLY)
ALT: 15 U/L (ref 0–44)
AST: 23 U/L (ref 15–41)
Albumin: 4.6 g/dL (ref 3.5–5.0)
Alkaline Phosphatase: 87 U/L (ref 38–126)
Anion gap: 11 (ref 5–15)
BUN: 17 mg/dL (ref 8–23)
CO2: 27 mmol/L (ref 22–32)
Calcium: 9.6 mg/dL (ref 8.9–10.3)
Chloride: 100 mmol/L (ref 98–111)
Creatinine: 0.95 mg/dL (ref 0.61–1.24)
GFR, Estimated: >60 mL/min (ref >60)
Glucose, Bld: 116 mg/dL — ABNORMAL HIGH (ref 70–99)
Potassium: 4.8 mmol/L (ref 3.5–5.1)
Sodium: 138 mmol/L (ref 135–145)
Total Bilirubin: 0.4 mg/dL (ref 0.0–1.2)
Total Protein: 7.6 g/dL (ref 6.5–8.1)

## 2023-10-06 LAB — DIRECT ANTIGLOBULIN TEST (NOT AT ARMC)
DAT, IgG: NEGATIVE
DAT, complement: NEGATIVE

## 2023-10-06 LAB — RETIC PANEL
Immature Retic Fract: 28.3 % — ABNORMAL HIGH (ref 2.3–15.9)
RBC.: 3.2 MIL/uL — ABNORMAL LOW (ref 4.22–5.81)
Retic Count, Absolute: 116.8 10*3/uL (ref 19.0–186.0)
Retic Ct Pct: 3.7 % — ABNORMAL HIGH (ref 0.4–3.1)
Reticulocyte Hemoglobin: 32.1 pg (ref >27.9)

## 2023-10-06 LAB — VITAMIN B12: Vitamin B-12: 320 pg/mL (ref 180–914)

## 2023-10-06 NOTE — Progress Notes (Signed)
 Patient ID: Randy Jacobson, male   DOB: [birth year], 79 y.o.   MRN: [id number]  Reason for Consult: Follow-up   Referred by Medina-Vargas, Monina C*  Subjective:     HPI Randy Jacobson is a 78 y.o. male with PAD who underwent right common femoral endarterectomy with profundoplasty and right external iliac stenting on 06/24/2023.  This is a 11-month follow-up.  He states he is doing well and that his incision is well-healed.  He has been ambulating without much pain but reports that he has pins-and-needles pain in his feet bilaterally.  He also developed a lateral foot ulcer near his fifth MTP joint.  It has been slowly healing and sees a podiatrist.  He also has a history of left iliofemoral endarterectomy and left femoral to below-knee popliteal artery bypass in July 2024 and subsequently underwent drug-coated balloon angioplasty of the left femoral-popliteal bypass distal anastomosis on 06/20/2023 due to threatened bypass.  He also denies any claudication on the left leg although has pins-and-needles pain as well as burning in numbness in her feet.  Has been trialing gabapentin  without success.  Past Medical History:  Diagnosis Date   Diabetes mellitus without complication (HCC)    Dysrhythmia    chronic RBBB; bradycardia s/p atropine  following sheath removal, 3.8 second sinus pause 10/11/22   Hypertension    Peripheral arterial disease (HCC)    Peripheral artery disease (HCC)    Family History  Problem Relation Age of Onset   Diabetes Brother    Past Surgical History:  Procedure Laterality Date   stents in both my legs     ABDOMINAL AORTOGRAM W/LOWER EXTREMITY N/A 10/11/2022   Procedure: ABDOMINAL AORTOGRAM W/LOWER EXTREMITY;  Surgeon: Eliza Lonni RAMAN, MD;  Location: [facility] INVASIVE CV LAB;  Service: Cardiovascular;  Laterality: N/A;   ABDOMINAL AORTOGRAM W/LOWER EXTREMITY Left 06/20/2023   Procedure: ABDOMINAL AORTOGRAM W/LOWER EXTREMITY;  Surgeon: Pearline Norman RAMAN, MD;  Location: [facility]  INVASIVE CV LAB;  Service: Cardiovascular;  Laterality: Left;   COLONOSCOPY WITH PROPOFOL  N/A 01/18/2023   Procedure: COLONOSCOPY WITH PROPOFOL ;  Surgeon: Saintclair Jasper, MD;  Location: [facility] ENDOSCOPY;  Service: Gastroenterology;  Laterality: N/A;   ENDARTERECTOMY FEMORAL Left 10/15/2022   Procedure: ENDARTERECTOMY OF LEFT ILIOFEMORAL ARTERY;  Surgeon: Eliza Lonni RAMAN, MD;  Location: [facility] OR;  Service: Vascular;  Laterality: Left;   ENDARTERECTOMY FEMORAL Right 06/23/2023   Procedure: COMMON FEMORAL ENDARTERECTOMY;  Surgeon: Pearline Norman RAMAN, MD;  Location: [facility] OR;  Service: Vascular;  Laterality: Right;   ESOPHAGOGASTRODUODENOSCOPY (EGD) WITH PROPOFOL  N/A 01/18/2023   Procedure: ESOPHAGOGASTRODUODENOSCOPY (EGD) WITH PROPOFOL ;  Surgeon: Saintclair Jasper, MD;  Location: [facility] ENDOSCOPY;  Service: Gastroenterology;  Laterality: N/A;   FEMORAL-POPLITEAL BYPASS GRAFT Left 10/15/2022   Procedure: LEFT FEMORAL-BELOW KNEE POPLITEAL ARTERY BYPASS WITH VEIN HARVEST OF LEFT GREATER SAPHENOUS VEIN;  Surgeon: Eliza Lonni RAMAN, MD;  Location: [facility] OR;  Service: Vascular;  Laterality: Left;   GIVENS CAPSULE STUDY N/A 04/10/2017   Procedure: GIVENS CAPSULE STUDY;  Surgeon: Dianna Specking, MD;  Location: [facility] ENDOSCOPY;  Service: Endoscopy;  Laterality: N/A;   GIVENS CAPSULE STUDY N/A 01/18/2023   Procedure: GIVENS CAPSULE STUDY;  Surgeon: Saintclair Jasper, MD;  Location: [facility] ENDOSCOPY;  Service: Gastroenterology;  Laterality: N/A;   INSERTION OF ILIAC STENT Right 06/23/2023   Procedure: INSERTION, STENT, ARTERY, ILIAC;  Surgeon: Pearline Norman RAMAN, MD;  Location: [facility] OR;  Service: Vascular;  Laterality: Right;   IR GENERIC HISTORICAL  08/26/2015   IR RADIOLOGIST EVAL &  MGMT 08/26/2015 Wilkie Lent, MD [facility] INTERV RAD   IR GENERIC HISTORICAL  04/21/2016   IR RADIOLOGIST EVAL & MGMT 04/21/2016 Wilkie Lent, MD [facility] INTERV RAD   LOWER EXTREMITY ANGIOGRAM Right 06/23/2023   Procedure: GERALYN, LOWER EXTREMITY;  Surgeon:  Pearline Norman RAMAN, MD;  Location: [facility] OR;  Service: Vascular;  Laterality: Right;   LOWER EXTREMITY INTERVENTION Left 06/20/2023   Procedure: LOWER EXTREMITY INTERVENTION;  Surgeon: Pearline Norman RAMAN, MD;  Location: [facility] INVASIVE CV LAB;  Service: Cardiovascular;  Laterality: Left;  DCB   PATCH ANGIOPLASTY Left 10/15/2022   Procedure: PATCH ANGIOPLASTY USING XENOSURE BIOLOGIC PATCH 1CMX14CM;  Surgeon: Eliza Lonni RAMAN, MD;  Location: [facility] OR;  Service: Vascular;  Laterality: Left;   PATCH ANGIOPLASTY Right 06/23/2023   Procedure: ANGIOPLASTY, USING PATCH GRAFT 6CM GEORGE;  Surgeon: Pearline Norman RAMAN, MD;  Location: [facility] OR;  Service: Vascular;  Laterality: Right;   PERIPHERAL VASCULAR INTERVENTION  10/11/2022   Procedure: PERIPHERAL VASCULAR INTERVENTION;  Surgeon: Eliza Lonni RAMAN, MD;  Location: [facility] INVASIVE CV LAB;  Service: Cardiovascular;;    Short Social History:  Social History   Tobacco Use   Smoking status: Former    Current packs/day: 0.00    Average packs/day: 1 pack/day for 36.0 years (36.0 ttl pk-yrs)    Types: Cigarettes    Start date: 11/20/1957    Quit date: 11/20/1993    Years since quitting: 29.8   Smokeless tobacco: Never  Substance Use Topics   Alcohol use: Yes    Alcohol/week: 0.0 standard drinks of alcohol    Comment: occ    No Known Allergies  Current Outpatient Medications  Medication Sig Dispense Refill   Ascorbic Acid (VITAMIN C ) 1000 MG tablet Take 1 tablet (1,000 mg total) by mouth daily.     aspirin  EC 81 MG tablet Take 1 tablet (81 mg total) by mouth daily at 6 (six) AM. Swallow whole. 30 tablet 12   atorvastatin  (LIPITOR) 40 MG tablet Take 1 tablet (40 mg total) by mouth daily. 90 tablet 0   cephALEXin  (KEFLEX ) 500 MG capsule Take 1 capsule (500 mg total) by mouth 4 (four) times daily. 40 capsule 0   clopidogrel  (PLAVIX ) 75 MG tablet Take 1 tablet (75 mg total) by mouth daily. 90 tablet 1   Continuous Glucose Receiver (FREESTYLE LIBRE 2 READER) DEVI 1  Device by Does not apply route 3 (three) times daily. E11.69 1 each 11   Continuous Glucose Sensor (FREESTYLE LIBRE 2 SENSOR) MISC 2 Devices by Does not apply route daily. E11.69 2 each 11   ferrous sulfate  325 (65 FE) MG tablet Take 1 tablet (325 mg total) by mouth daily with breakfast. (Patient taking differently: Take 325 mg by mouth in the morning and at bedtime.) 90 tablet 3   gabapentin  (NEURONTIN ) 100 MG capsule Take 1 capsule (100 mg total) by mouth 3 (three) times daily. 90 capsule 3   hydrochlorothiazide  (HYDRODIURIL ) 25 MG tablet Take 0.5 tablets (12.5 mg total) by mouth daily. 45 tablet 0   losartan  (COZAAR ) 50 MG tablet Take 1 tablet (50 mg total) by mouth in the morning and at bedtime. 180 tablet 1   Melatonin 10 MG TABS Take 10 mg by mouth at bedtime.     metFORMIN  (GLUCOPHAGE ) 1000 MG tablet Take 1 tablet (1,000 mg total) by mouth 2 (two) times daily with a meal. 180 tablet 1   pantoprazole  (PROTONIX ) 40 MG tablet Take 1 tablet (40 mg total) by mouth daily. 90 tablet  1   sitaGLIPtin  (JANUVIA ) 50 MG tablet Take 1 tablet (50 mg total) by mouth daily as needed (for high blood sugar). 90 tablet 1   TRESIBA  FLEXTOUCH 100 UNIT/ML FlexTouch Pen Inject 38 Units into the skin daily. 15 mL 5   No current facility-administered medications for this visit.    REVIEW OF SYSTEMS  All other systems were reviewed and are negative     Objective:  Objective   Vitals:   10/07/23 1100  BP: (!) 142/71  Pulse: 85  Resp: 18  Temp: 98.2 F (36.8 C)  TempSrc: Temporal  SpO2: 98%  Weight: 183 lb 3.2 oz (83.1 kg)  Height: 5' 11 (1.803 m)   Body mass index is 25.55 kg/m.  Physical Exam General: no acute distress Cardiac: hemodynamically stable Pulm: normal work of breathing Neuro: alert, no focal deficit Extremities: Small 5 mm circular ulcer on the lateral aspect of the right foot, clean base, no drainage.  No wounds on left. Vascular:   Right: Palpable femoral  Left: Palpable  femoral  Data: ABI +---------+------------------+-----+----------+--------+  Right   Rt Pressure (mmHg)IndexWaveform  Comment   +---------+------------------+-----+----------+--------+  Brachial 164                                        +---------+------------------+-----+----------+--------+  ATA     79                0.48 monophasic          +---------+------------------+-----+----------+--------+  PTA     94                0.57 monophasic          +---------+------------------+-----+----------+--------+  Great Toe                       Absent              +---------+------------------+-----+----------+--------+   +---------+------------------+-----+--------+-------+  Left    Lt Pressure (mmHg)IndexWaveformComment  +---------+------------------+-----+--------+-------+  Brachial 165                                     +---------+------------------+-----+--------+-------+  ATA     233               1.41 biphasic         +---------+------------------+-----+--------+-------+  PTA     100               0.61 biphasic         +---------+------------------+-----+--------+-------+  Great Toe                       Absent           +---------+------------------+-----+--------+-------+  Previously: Right 0, toe pressure 0; left 0.98, toe pressure 34  Left leg duplex +--------+--------+-----+--------+---------+--------+  LEFT   PSV cm/sRatioStenosisWaveform Comments  +--------+--------+-----+--------+---------+--------+  CFA [id number]                  triphasic          +--------+--------+-----+--------+---------+--------+  TP [id number]                   triphasic          +--------+--------+-----+--------+---------+--------+     Left Graft #1: CFA to BK  Pop  +--------------------+--------+--------+---------+--------+                     PSV cm/sStenosisWaveform Comments   +--------------------+--------+--------+---------+--------+  Inflow             105             triphasic          +--------------------+--------+--------+---------+--------+  Proximal Anastomosis110             biphasic           +--------------------+--------+--------+---------+--------+  Proximal Graft      104             biphasic           +--------------------+--------+--------+---------+--------+  Mid Graft           106             biphasic           +--------------------+--------+--------+---------+--------+  Distal Graft        96              biphasic           +--------------------+--------+--------+---------+--------+  Distal Anastomosis  144             biphasic           +--------------------+--------+--------+---------+--------+  Outflow            87              triphasic          +--------------------+--------+--------+---------+--------+      Assessment/Plan:   [name] is a 78 y.o. male with PAD and chronic limb threatening ischemia.  He is underwent:  10/15/22: left iliofemoral endarterectomy and left femoral to below-knee popliteal artery bypass 06/20/2023: drug-coated balloon angioplasty of the left femoral-popliteal bypass distal anastomosis 06/24/2023  right common femoral endarterectomy with profundoplasty and right external iliac stenting  He does have palpable femoral pulses which makes me believe his inflow is optimized bilaterally and his left bypass duplex demonstrates wide patency without any recurrent stenosis.  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  I also explained that the ulcer on the lateral aspect of his right foot does make me more worried given his absent toe pressure although his ABI is much improved since his  femoral endarterectomy and iliac stent.  He has a known right SFA occlusion with reconstitution of the above-knee popliteal. I offered right leg angiogram with attempted recanalization of his SFA given his foot wound.  Risk and benefits were reviewed, he expressed understanding and elects to proceed.  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 The patient is on best medical therapy for peripheral arterial disease. The patient has been counseled about the risks of tobacco use in atherosclerotic disease. The patient has been counseled to abstain from any tobacco use. An aortogram with bilateral lower extremity runoff angiography and Right lower extremity intervention and is indicated to better evaluate the patient's lower extremity circulation because of the limb threatening nature of the patient's diagnosis. Based on the patient's clinical exam and non-invasive data, we anticipate an endovascular intervention in the femoropopliteal and tibial vessels. Stenting and/or athrectomy would be favored because of the improved primary patency  of these interventions as compared to plain balloon angioplasty.   Norman GORMAN Serve MD [facility]

## 2023-10-06 NOTE — Progress Notes (Signed)
  [facility] OFFICE PROGRESS NOTE   Diagnosis: Iron  deficiency anemia.  INTERVAL HISTORY:   Mr. Devonshire returns for follow-up.  He continues oral iron .  He denies nausea and constipation.  No bleeding.  He reports increased neuropathy symptoms in the feet.  He has noted no improvement since beginning gabapentin .  He reports an ulcer on the right foot, followed by the wound clinic.  No fevers or sweats.  Good appetite.  Reports weight is stable.  Objective:  Vital signs in last 24 hours:  Blood pressure 124/64, pulse 60, temperature 98.1 F (36.7 C), temperature source Temporal, resp. rate 18, height 5' 11 (1.803 m), weight 180 lb 14.4 oz (82.1 kg), SpO2 100%.    HEENT: No thrush or ulcers. Resp: Distant breath sounds.  No respiratory distress. Cardio: Regular rate and rhythm. GI: No hepatosplenomegaly. Vascular: No leg edema. Skin: Ulcer lateral aspect left foot.   Lab Results:  Lab Results  Component Value Date   WBC 8.8 10/06/2023   HGB 10.0 (L) 10/06/2023   HCT 30.4 (L) 10/06/2023   MCV 94.7 10/06/2023   PLT 269 10/06/2023   NEUTROABS 5.6 10/06/2023    Imaging:  No results found.  Medications: I have reviewed the patient's current medications.  Assessment/Plan: Anemia Chronic anemia with intermittent Red cell microcytosis Documented iron  deficiency on repeat ferritin testing including 05/23/2023 He has received Feraheme  and Venofer  in the past 01/16/2023 fecal occult blood positive; negative GI workup for a source of blood loss including upper endoscopy, colonoscopy, and small bowel camera endoscopy 01/18/2023 Feraheme  05/26/2023, 06/02/2023 History of iron  deficiency anemia in 2018 Gastric ulcers 2018 4.   Diabetes 5.   Hypertension 6.   Peripheral vascular disease-maintained on aspirin  and Plavix  7.   Carotid artery stenosis 8.   Peripheral neuropathy  Disposition: Mr. Voorhees appears stable.  We reviewed the CBC from today.  There has been  partial correction of the hemoglobin following iron  replacement.  MCV and ferritin are both now in normal range.  We discussed the possibility of continued subclinical blood loss from the GI tract.  We also discussed the possibility of the anemia being multifactorial.  He will complete a set of stool cards.  We are obtaining additional laboratory evaluation today.  He will return for a CBC and follow-up visit in 4 weeks.  We are available to see him sooner if needed.   Olam Ned ANP/GNP-BC   10/06/2023  10:30 AM

## 2023-10-06 NOTE — H&P (View-Only) (Signed)
 Patient ID: Randy Jacobson, male   DOB: [birth year], 79 y.o.   MRN: [id number]  Reason for Consult: Follow-up   Referred by Medina-Vargas, Monina C*  Subjective:     HPI Randy Jacobson is a 78 y.o. male with PAD who underwent right common femoral endarterectomy with profundoplasty and right external iliac stenting on 06/24/2023.  This is a 11-month follow-up.  He states he is doing well and that his incision is well-healed.  He has been ambulating without much pain but reports that he has pins-and-needles pain in his feet bilaterally.  He also developed a lateral foot ulcer near his fifth MTP joint.  It has been slowly healing and sees a podiatrist.  He also has a history of left iliofemoral endarterectomy and left femoral to below-knee popliteal artery bypass in July 2024 and subsequently underwent drug-coated balloon angioplasty of the left femoral-popliteal bypass distal anastomosis on 06/20/2023 due to threatened bypass.  He also denies any claudication on the left leg although has pins-and-needles pain as well as burning in numbness in her feet.  Has been trialing gabapentin  without success.  Past Medical History:  Diagnosis Date   Diabetes mellitus without complication (HCC)    Dysrhythmia    chronic RBBB; bradycardia s/p atropine  following sheath removal, 3.8 second sinus pause 10/11/22   Hypertension    Peripheral arterial disease (HCC)    Peripheral artery disease (HCC)    Family History  Problem Relation Age of Onset   Diabetes Brother    Past Surgical History:  Procedure Laterality Date   stents in both my legs     ABDOMINAL AORTOGRAM W/LOWER EXTREMITY N/A 10/11/2022   Procedure: ABDOMINAL AORTOGRAM W/LOWER EXTREMITY;  Surgeon: Eliza Lonni RAMAN, MD;  Location: [facility] INVASIVE CV LAB;  Service: Cardiovascular;  Laterality: N/A;   ABDOMINAL AORTOGRAM W/LOWER EXTREMITY Left 06/20/2023   Procedure: ABDOMINAL AORTOGRAM W/LOWER EXTREMITY;  Surgeon: Pearline Norman RAMAN, MD;  Location: [facility]  INVASIVE CV LAB;  Service: Cardiovascular;  Laterality: Left;   COLONOSCOPY WITH PROPOFOL  N/A 01/18/2023   Procedure: COLONOSCOPY WITH PROPOFOL ;  Surgeon: Saintclair Jasper, MD;  Location: [facility] ENDOSCOPY;  Service: Gastroenterology;  Laterality: N/A;   ENDARTERECTOMY FEMORAL Left 10/15/2022   Procedure: ENDARTERECTOMY OF LEFT ILIOFEMORAL ARTERY;  Surgeon: Eliza Lonni RAMAN, MD;  Location: [facility] OR;  Service: Vascular;  Laterality: Left;   ENDARTERECTOMY FEMORAL Right 06/23/2023   Procedure: COMMON FEMORAL ENDARTERECTOMY;  Surgeon: Pearline Norman RAMAN, MD;  Location: [facility] OR;  Service: Vascular;  Laterality: Right;   ESOPHAGOGASTRODUODENOSCOPY (EGD) WITH PROPOFOL  N/A 01/18/2023   Procedure: ESOPHAGOGASTRODUODENOSCOPY (EGD) WITH PROPOFOL ;  Surgeon: Saintclair Jasper, MD;  Location: [facility] ENDOSCOPY;  Service: Gastroenterology;  Laterality: N/A;   FEMORAL-POPLITEAL BYPASS GRAFT Left 10/15/2022   Procedure: LEFT FEMORAL-BELOW KNEE POPLITEAL ARTERY BYPASS WITH VEIN HARVEST OF LEFT GREATER SAPHENOUS VEIN;  Surgeon: Eliza Lonni RAMAN, MD;  Location: [facility] OR;  Service: Vascular;  Laterality: Left;   GIVENS CAPSULE STUDY N/A 04/10/2017   Procedure: GIVENS CAPSULE STUDY;  Surgeon: Dianna Specking, MD;  Location: [facility] ENDOSCOPY;  Service: Endoscopy;  Laterality: N/A;   GIVENS CAPSULE STUDY N/A 01/18/2023   Procedure: GIVENS CAPSULE STUDY;  Surgeon: Saintclair Jasper, MD;  Location: [facility] ENDOSCOPY;  Service: Gastroenterology;  Laterality: N/A;   INSERTION OF ILIAC STENT Right 06/23/2023   Procedure: INSERTION, STENT, ARTERY, ILIAC;  Surgeon: Pearline Norman RAMAN, MD;  Location: [facility] OR;  Service: Vascular;  Laterality: Right;   IR GENERIC HISTORICAL  08/26/2015   IR RADIOLOGIST EVAL &  MGMT 08/26/2015 Wilkie Lent, MD [facility] INTERV RAD   IR GENERIC HISTORICAL  04/21/2016   IR RADIOLOGIST EVAL & MGMT 04/21/2016 Wilkie Lent, MD [facility] INTERV RAD   LOWER EXTREMITY ANGIOGRAM Right 06/23/2023   Procedure: GERALYN, LOWER EXTREMITY;  Surgeon:  Pearline Norman RAMAN, MD;  Location: [facility] OR;  Service: Vascular;  Laterality: Right;   LOWER EXTREMITY INTERVENTION Left 06/20/2023   Procedure: LOWER EXTREMITY INTERVENTION;  Surgeon: Pearline Norman RAMAN, MD;  Location: [facility] INVASIVE CV LAB;  Service: Cardiovascular;  Laterality: Left;  DCB   PATCH ANGIOPLASTY Left 10/15/2022   Procedure: PATCH ANGIOPLASTY USING XENOSURE BIOLOGIC PATCH 1CMX14CM;  Surgeon: Eliza Lonni RAMAN, MD;  Location: [facility] OR;  Service: Vascular;  Laterality: Left;   PATCH ANGIOPLASTY Right 06/23/2023   Procedure: ANGIOPLASTY, USING PATCH GRAFT 6CM GEORGE;  Surgeon: Pearline Norman RAMAN, MD;  Location: [facility] OR;  Service: Vascular;  Laterality: Right;   PERIPHERAL VASCULAR INTERVENTION  10/11/2022   Procedure: PERIPHERAL VASCULAR INTERVENTION;  Surgeon: Eliza Lonni RAMAN, MD;  Location: [facility] INVASIVE CV LAB;  Service: Cardiovascular;;    Short Social History:  Social History   Tobacco Use   Smoking status: Former    Current packs/day: 0.00    Average packs/day: 1 pack/day for 36.0 years (36.0 ttl pk-yrs)    Types: Cigarettes    Start date: 11/20/1957    Quit date: 11/20/1993    Years since quitting: 29.8   Smokeless tobacco: Never  Substance Use Topics   Alcohol use: Yes    Alcohol/week: 0.0 standard drinks of alcohol    Comment: occ    No Known Allergies  Current Outpatient Medications  Medication Sig Dispense Refill   Ascorbic Acid (VITAMIN C ) 1000 MG tablet Take 1 tablet (1,000 mg total) by mouth daily.     aspirin  EC 81 MG tablet Take 1 tablet (81 mg total) by mouth daily at 6 (six) AM. Swallow whole. 30 tablet 12   atorvastatin  (LIPITOR) 40 MG tablet Take 1 tablet (40 mg total) by mouth daily. 90 tablet 0   cephALEXin  (KEFLEX ) 500 MG capsule Take 1 capsule (500 mg total) by mouth 4 (four) times daily. 40 capsule 0   clopidogrel  (PLAVIX ) 75 MG tablet Take 1 tablet (75 mg total) by mouth daily. 90 tablet 1   Continuous Glucose Receiver (FREESTYLE LIBRE 2 READER) DEVI 1  Device by Does not apply route 3 (three) times daily. E11.69 1 each 11   Continuous Glucose Sensor (FREESTYLE LIBRE 2 SENSOR) MISC 2 Devices by Does not apply route daily. E11.69 2 each 11   ferrous sulfate  325 (65 FE) MG tablet Take 1 tablet (325 mg total) by mouth daily with breakfast. (Patient taking differently: Take 325 mg by mouth in the morning and at bedtime.) 90 tablet 3   gabapentin  (NEURONTIN ) 100 MG capsule Take 1 capsule (100 mg total) by mouth 3 (three) times daily. 90 capsule 3   hydrochlorothiazide  (HYDRODIURIL ) 25 MG tablet Take 0.5 tablets (12.5 mg total) by mouth daily. 45 tablet 0   losartan  (COZAAR ) 50 MG tablet Take 1 tablet (50 mg total) by mouth in the morning and at bedtime. 180 tablet 1   Melatonin 10 MG TABS Take 10 mg by mouth at bedtime.     metFORMIN  (GLUCOPHAGE ) 1000 MG tablet Take 1 tablet (1,000 mg total) by mouth 2 (two) times daily with a meal. 180 tablet 1   pantoprazole  (PROTONIX ) 40 MG tablet Take 1 tablet (40 mg total) by mouth daily. 90 tablet  1   sitaGLIPtin  (JANUVIA ) 50 MG tablet Take 1 tablet (50 mg total) by mouth daily as needed (for high blood sugar). 90 tablet 1   TRESIBA  FLEXTOUCH 100 UNIT/ML FlexTouch Pen Inject 38 Units into the skin daily. 15 mL 5   No current facility-administered medications for this visit.    REVIEW OF SYSTEMS  All other systems were reviewed and are negative     Objective:  Objective   Vitals:   10/07/23 1100  BP: (!) 142/71  Pulse: 85  Resp: 18  Temp: 98.2 F (36.8 C)  TempSrc: Temporal  SpO2: 98%  Weight: 183 lb 3.2 oz (83.1 kg)  Height: 5' 11 (1.803 m)   Body mass index is 25.55 kg/m.  Physical Exam General: no acute distress Cardiac: hemodynamically stable Pulm: normal work of breathing Neuro: alert, no focal deficit Extremities: Small 5 mm circular ulcer on the lateral aspect of the right foot, clean base, no drainage.  No wounds on left. Vascular:   Right: Palpable femoral  Left: Palpable  femoral  Data: ABI +---------+------------------+-----+----------+--------+  Right   Rt Pressure (mmHg)IndexWaveform  Comment   +---------+------------------+-----+----------+--------+  Brachial 164                                        +---------+------------------+-----+----------+--------+  ATA     79                0.48 monophasic          +---------+------------------+-----+----------+--------+  PTA     94                0.57 monophasic          +---------+------------------+-----+----------+--------+  Great Toe                       Absent              +---------+------------------+-----+----------+--------+   +---------+------------------+-----+--------+-------+  Left    Lt Pressure (mmHg)IndexWaveformComment  +---------+------------------+-----+--------+-------+  Brachial 165                                     +---------+------------------+-----+--------+-------+  ATA     233               1.41 biphasic         +---------+------------------+-----+--------+-------+  PTA     100               0.61 biphasic         +---------+------------------+-----+--------+-------+  Great Toe                       Absent           +---------+------------------+-----+--------+-------+  Previously: Right 0, toe pressure 0; left 0.98, toe pressure 34  Left leg duplex +--------+--------+-----+--------+---------+--------+  LEFT   PSV cm/sRatioStenosisWaveform Comments  +--------+--------+-----+--------+---------+--------+  CFA Prox148                  triphasic          +--------+--------+-----+--------+---------+--------+  TP Trunk40                   triphasic          +--------+--------+-----+--------+---------+--------+     Left Graft #1: CFA to BK  Pop  +--------------------+--------+--------+---------+--------+                     PSV cm/sStenosisWaveform Comments   +--------------------+--------+--------+---------+--------+  Inflow             105             triphasic          +--------------------+--------+--------+---------+--------+  Proximal Anastomosis110             biphasic           +--------------------+--------+--------+---------+--------+  Proximal Graft      104             biphasic           +--------------------+--------+--------+---------+--------+  Mid Graft           106             biphasic           +--------------------+--------+--------+---------+--------+  Distal Graft        96              biphasic           +--------------------+--------+--------+---------+--------+  Distal Anastomosis  144             biphasic           +--------------------+--------+--------+---------+--------+  Outflow            87              triphasic          +--------------------+--------+--------+---------+--------+      Assessment/Plan:   Randy Jacobson is a 78 y.o. male with PAD and chronic limb threatening ischemia.  He is underwent:  10/15/22: left iliofemoral endarterectomy and left femoral to below-knee popliteal artery bypass 06/20/2023: drug-coated balloon angioplasty of the left femoral-popliteal bypass distal anastomosis 06/24/2023  right common femoral endarterectomy with profundoplasty and right external iliac stenting  He does have palpable femoral pulses which makes me believe his inflow is optimized bilaterally and his left bypass duplex demonstrates wide patency without any recurrent stenosis.  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  I also explained that the ulcer on the lateral aspect of his right foot does make me more worried given his absent toe pressure although his ABI is much improved since his  femoral endarterectomy and iliac stent.  He has a known right SFA occlusion with reconstitution of the above-knee popliteal. I offered right leg angiogram with attempted recanalization of his SFA given his foot wound.  Risk and benefits were reviewed, he expressed understanding and elects to proceed.  Randy Jacobson of the native arteries of the Right lower extremities causing ulceration. The patient is on best medical therapy for peripheral arterial disease. The patient has been counseled about the risks of tobacco use in atherosclerotic disease. The patient has been counseled to abstain from any tobacco use. An aortogram with bilateral lower extremity runoff angiography and Right lower extremity intervention and is indicated to better evaluate the patient's lower extremity circulation because of the limb threatening nature of the patient's diagnosis. Based on the patient's clinical exam and non-invasive data, we anticipate an endovascular intervention in the femoropopliteal and tibial vessels. Stenting and/or athrectomy would be favored because of the improved primary patency  of these interventions as compared to plain balloon angioplasty.   Norman GORMAN Serve MD [facility]

## 2023-10-07 ENCOUNTER — Ambulatory Visit (HOSPITAL_BASED_OUTPATIENT_CLINIC_OR_DEPARTMENT_OTHER)
Admission: RE | Admit: 2023-10-07 | Discharge: 2023-10-07 | Disposition: A | Discharge status: Home / Self Care | Source: Ambulatory Visit | Attending: Vascular Surgery | Admitting: Vascular Surgery

## 2023-10-07 ENCOUNTER — Encounter: Payer: Self-pay | Admitting: Vascular Surgery

## 2023-10-07 ENCOUNTER — Ambulatory Visit (HOSPITAL_COMMUNITY)
Admission: RE | Admit: 2023-10-07 | Discharge: 2023-10-07 | Disposition: A | Discharge status: Home / Self Care | Source: Ambulatory Visit | Attending: Vascular Surgery

## 2023-10-07 ENCOUNTER — Ambulatory Visit: Admitting: Vascular Surgery

## 2023-10-07 VITALS — BP 142/71 | HR 85 | Temp 98.2°F | Resp 18 | Ht 71.0 in | Wt 183.2 lb

## 2023-10-07 DIAGNOSIS — I70221 Atherosclerosis of native arteries of extremities with rest pain, right leg: Secondary | ICD-10-CM | POA: Diagnosis not present

## 2023-10-07 DIAGNOSIS — I7025 Atherosclerosis of native arteries of other extremities with ulceration: Secondary | ICD-10-CM

## 2023-10-07 DIAGNOSIS — I739 Peripheral vascular disease, unspecified: Secondary | ICD-10-CM

## 2023-10-07 LAB — VAS US ABI WITH/WO TBI
Left ABI: 1.41
Right ABI: 0.57

## 2023-10-09 LAB — MULTIPLE MYELOMA PANEL, SERUM
Albumin SerPl Elph-Mcnc: 4 g/dL (ref 2.9–4.4)
Albumin/Glob SerPl: 1.4 (ref 0.7–1.7)
Alpha 1: 0.1 g/dL (ref 0.0–0.4)
Alpha2 Glob SerPl Elph-Mcnc: 0.8 g/dL (ref 0.4–1.0)
B-Globulin SerPl Elph-Mcnc: 1 g/dL (ref 0.7–1.3)
Gamma Glob SerPl Elph-Mcnc: 0.9 g/dL (ref 0.4–1.8)
Globulin, Total: 2.9 g/dL (ref 2.2–3.9)
IgA: 256 mg/dL (ref 61–437)
IgG (Immunoglobin G), Serum: 989 mg/dL (ref 603–1613)
IgM (Immunoglobulin M), Srm: 90 mg/dL (ref 15–143)
Total Protein ELP: 6.9 g/dL (ref 6.0–8.5)

## 2023-10-10 ENCOUNTER — Telehealth: Payer: Self-pay

## 2023-10-10 ENCOUNTER — Other Ambulatory Visit: Payer: Self-pay

## 2023-10-10 DIAGNOSIS — I70221 Atherosclerosis of native arteries of extremities with rest pain, right leg: Secondary | ICD-10-CM

## 2023-10-10 NOTE — Telephone Encounter (Signed)
 Attempted to call for surgery scheduling. LVM

## 2023-10-11 NOTE — Telephone Encounter (Unsigned)
 Copied from CRM [id number]. Topic: Clinical - Medication Refill >> Oct 11, 2023  9:59 AM Suzette B wrote: Medication:  ferrous sulfate  325 (65 FE) MG tablet  Has the patient contacted their pharmacy? Yes Patient stated that he called [facility] on 06/10 and requested the refill, patient stated that the pharmacy advised him they had not gotten a response from the providers office, and wanted to know why the medication was being withheld from him. I advised patient that sometimes, it can be the length of time the medication since medications has been filled, possible miscommunication, but I reassured him that I was submitting the request for the refill.   This is the patient's preferred pharmacy:  [facility] [address] [place] [address] [place] [address] [place] [address] Phone: [phone] Fax: [phone]    Is this the correct pharmacy for this prescription? Yes If no, delete pharmacy and type the correct one.   Has the prescription been filled recently? No 01/14/23  Is the patient out of the medication? Yes  Has the patient been seen for an appointment in the last year OR does the patient have an upcoming appointment? Yes  Can we respond through MyChart? No  Agent: Please be advised that Rx refills may take up to 3 business days. We ask that you follow-up with your pharmacy.

## 2023-10-12 ENCOUNTER — Other Ambulatory Visit: Payer: Self-pay

## 2023-10-12 MED ORDER — FERROUS SULFATE 325 (65 FE) MG PO TABS: 325.0000 mg | ORAL_TABLET | Freq: Every day | ORAL | 1 refills | Status: DC

## 2023-10-17 ENCOUNTER — Encounter (HOSPITAL_COMMUNITY)
Admission: RE | Disposition: A | Discharge status: Home / Self Care | Payer: Self-pay | Source: Home / Self Care | Attending: Vascular Surgery

## 2023-10-17 ENCOUNTER — Ambulatory Visit (HOSPITAL_COMMUNITY)
Admission: RE | Admit: 2023-10-17 | Discharge: 2023-10-17 | Disposition: A | Discharge status: Home / Self Care | Attending: Vascular Surgery | Admitting: Vascular Surgery

## 2023-10-17 DIAGNOSIS — Z9889 Other specified postprocedural states: Secondary | ICD-10-CM

## 2023-10-17 DIAGNOSIS — Z794 Long term (current) use of insulin: Secondary | ICD-10-CM | POA: Diagnosis not present

## 2023-10-17 DIAGNOSIS — E11621 Type 2 diabetes mellitus with foot ulcer: Secondary | ICD-10-CM | POA: Diagnosis not present

## 2023-10-17 DIAGNOSIS — I7092 Chronic total occlusion of artery of the extremities: Secondary | ICD-10-CM

## 2023-10-17 DIAGNOSIS — L97519 Non-pressure chronic ulcer of other part of right foot with unspecified severity: Secondary | ICD-10-CM | POA: Diagnosis not present

## 2023-10-17 DIAGNOSIS — I70221 Atherosclerosis of native arteries of extremities with rest pain, right leg: Secondary | ICD-10-CM | POA: Diagnosis not present

## 2023-10-17 DIAGNOSIS — Z79899 Other long term (current) drug therapy: Secondary | ICD-10-CM | POA: Diagnosis not present

## 2023-10-17 DIAGNOSIS — I70235 Atherosclerosis of native arteries of right leg with ulceration of other part of foot: Secondary | ICD-10-CM | POA: Diagnosis not present

## 2023-10-17 DIAGNOSIS — I70291 Other atherosclerosis of native arteries of extremities, right leg: Secondary | ICD-10-CM

## 2023-10-17 DIAGNOSIS — Z95828 Presence of other vascular implants and grafts: Secondary | ICD-10-CM | POA: Diagnosis not present

## 2023-10-17 DIAGNOSIS — Z7984 Long term (current) use of oral hypoglycemic drugs: Secondary | ICD-10-CM | POA: Diagnosis not present

## 2023-10-17 DIAGNOSIS — S91104A Unspecified open wound of right lesser toe(s) without damage to nail, initial encounter: Secondary | ICD-10-CM

## 2023-10-17 DIAGNOSIS — Z95820 Peripheral vascular angioplasty status with implants and grafts: Secondary | ICD-10-CM

## 2023-10-17 DIAGNOSIS — Z87891 Personal history of nicotine dependence: Secondary | ICD-10-CM | POA: Insufficient documentation

## 2023-10-17 HISTORY — PX: LOWER EXTREMITY INTERVENTION: CATH118252

## 2023-10-17 HISTORY — PX: ABDOMINAL AORTOGRAM W/LOWER EXTREMITY: CATH118223

## 2023-10-17 LAB — POCT I-STAT, CHEM 8
BUN: 18 mg/dL (ref 8–23)
Calcium, Ion: 1.23 mmol/L (ref 1.15–1.40)
Chloride: 103 mmol/L (ref 98–111)
Creatinine, Ser: 0.9 mg/dL (ref 0.61–1.24)
Glucose, Bld: 101 mg/dL — ABNORMAL HIGH (ref 70–99)
HCT: 34 % — ABNORMAL LOW (ref 39.0–52.0)
Hemoglobin: 11.6 g/dL — ABNORMAL LOW (ref 13.0–17.0)
Potassium: 3.9 mmol/L (ref 3.5–5.1)
Sodium: 141 mmol/L (ref 135–145)
TCO2: 24 mmol/L (ref 22–32)

## 2023-10-17 SURGERY — ABDOMINAL AORTOGRAM W/LOWER EXTREMITY
Anesthesia: LOCAL

## 2023-10-17 MED ORDER — LABETALOL HCL 5 MG/ML IV SOLN: 10.0000 mg | INTRAVENOUS | Status: DC | PRN

## 2023-10-17 MED ORDER — FENTANYL CITRATE (PF) 100 MCG/2ML IJ SOLN
INTRAMUSCULAR | Status: AC
Start: 2023-10-17 — End: 2023-10-17
  Filled 2023-10-17: qty 2

## 2023-10-17 MED ORDER — HYDRALAZINE HCL 20 MG/ML IJ SOLN: 5.0000 mg | INTRAMUSCULAR | Status: DC | PRN

## 2023-10-17 MED ORDER — SODIUM CHLORIDE 0.9% FLUSH: 3.0000 mL | INTRAVENOUS | Status: DC | PRN

## 2023-10-17 MED ORDER — IODIXANOL 320 MG/ML IV SOLN
INTRAVENOUS | Status: DC | PRN
Start: 2023-10-17 — End: 2023-10-17
  Administered 2023-10-17: 80 mL

## 2023-10-17 MED ORDER — LIDOCAINE HCL (PF) 1 % IJ SOLN
INTRAMUSCULAR | Status: AC
  Filled 2023-10-17: qty 30

## 2023-10-17 MED ORDER — SODIUM CHLORIDE 0.9 % IV SOLN: INTRAVENOUS | Status: DC

## 2023-10-17 MED ORDER — SODIUM CHLORIDE 0.9 % IV SOLN: 250.0000 mL | INTRAVENOUS | Status: DC | PRN

## 2023-10-17 MED ORDER — FENTANYL CITRATE (PF) 100 MCG/2ML IJ SOLN
INTRAMUSCULAR | Status: DC | PRN
  Administered 2023-10-17: 50 ug via INTRAVENOUS

## 2023-10-17 MED ORDER — ACETAMINOPHEN 325 MG PO TABS: 650.0000 mg | ORAL_TABLET | ORAL | Status: DC | PRN

## 2023-10-17 MED ORDER — CLOPIDOGREL BISULFATE 75 MG PO TABS
ORAL_TABLET | ORAL | Status: DC | PRN
Start: 2023-10-17 — End: 2023-10-17
  Administered 2023-10-17: 75 mg via ORAL

## 2023-10-17 MED ORDER — MIDAZOLAM HCL 2 MG/2ML IJ SOLN
INTRAMUSCULAR | Status: AC
  Filled 2023-10-17: qty 2

## 2023-10-17 MED ORDER — CLOPIDOGREL BISULFATE 75 MG PO TABS
ORAL_TABLET | ORAL | Status: AC
  Filled 2023-10-17: qty 1

## 2023-10-17 MED ORDER — LIDOCAINE HCL (PF) 1 % IJ SOLN
INTRAMUSCULAR | Status: DC | PRN
  Administered 2023-10-17: 15 mL

## 2023-10-17 MED ORDER — SODIUM CHLORIDE 0.9 % WEIGHT BASED INFUSION: 1.0000 mL/kg/h | INTRAVENOUS | Status: DC

## 2023-10-17 MED ORDER — ASPIRIN 81 MG PO CHEW
CHEWABLE_TABLET | ORAL | Status: DC | PRN
  Administered 2023-10-17: 81 mg via ORAL

## 2023-10-17 MED ORDER — SODIUM CHLORIDE 0.9% FLUSH: 3.0000 mL | Freq: Two times a day (BID) | INTRAVENOUS | Status: DC

## 2023-10-17 MED ORDER — ASPIRIN 81 MG PO CHEW
CHEWABLE_TABLET | ORAL | Status: AC
  Filled 2023-10-17: qty 1

## 2023-10-17 MED ORDER — ONDANSETRON HCL 4 MG/2ML IJ SOLN: 4.0000 mg | Freq: Four times a day (QID) | INTRAMUSCULAR | Status: DC | PRN

## 2023-10-17 MED ORDER — HEPARIN (PORCINE) IN NACL 2000-0.9 UNIT/L-% IV SOLN
INTRAVENOUS | Status: DC | PRN
  Administered 2023-10-17: 1000 mL

## 2023-10-17 MED ORDER — HYDRALAZINE HCL 20 MG/ML IJ SOLN
INTRAMUSCULAR | Status: DC | PRN
Start: 2023-10-17 — End: 2023-10-17
  Administered 2023-10-17: 20 mg via INTRAVENOUS

## 2023-10-17 MED ORDER — MIDAZOLAM HCL 2 MG/2ML IJ SOLN
INTRAMUSCULAR | Status: DC | PRN
  Administered 2023-10-17: 1 mg via INTRAVENOUS

## 2023-10-17 MED ORDER — HEPARIN SODIUM (PORCINE) 1000 UNIT/ML IJ SOLN
INTRAMUSCULAR | Status: AC
Start: 2023-10-17 — End: 2023-10-17
  Filled 2023-10-17: qty 10

## 2023-10-17 MED ORDER — HYDRALAZINE HCL 20 MG/ML IJ SOLN
INTRAMUSCULAR | Status: AC
  Filled 2023-10-17: qty 1

## 2023-10-17 MED ORDER — HEPARIN SODIUM (PORCINE) 1000 UNIT/ML IJ SOLN
INTRAMUSCULAR | Status: DC | PRN
  Administered 2023-10-17: 8000 [IU] via INTRAVENOUS

## 2023-10-17 SURGICAL SUPPLY — 26 items
BAG SNAP BAND KOVER 36X36 (MISCELLANEOUS) IMPLANT
BALLOON MUSTANG 6X150X135 (BALLOONS) IMPLANT
CATH CXI SUPP 2.6F 135 ST (CATHETERS) IMPLANT
CATH OMNI FLUSH 5F 65CM (CATHETERS) IMPLANT
CATH QUICKCROSS SUPP .035X90CM (MICROCATHETER) IMPLANT
CATH SHOCKWAVE E8 5X80 (CATHETERS) IMPLANT
CLOSURE MYNX CONTROL 6F/7F (Vascular Products) IMPLANT
COVER DOME SNAP 22 D (MISCELLANEOUS) IMPLANT
DCB IN.PACT 6X80 (BALLOONS) IMPLANT
DEVICE TORQUE .025-.038 (MISCELLANEOUS) IMPLANT
GLIDEWIRE ADV .035X260CM (WIRE) IMPLANT
GLIDEWIRE ANGLED SS 035X260CM (WIRE) IMPLANT
KIT ENCORE 26 ADVANTAGE (KITS) IMPLANT
KIT MICROPUNCTURE NIT STIFF (SHEATH) IMPLANT
KIT SINGLE USE MANIFOLD (KITS) IMPLANT
KIT SYRINGE INJ CVI SPIKEX1 (MISCELLANEOUS) IMPLANT
SET ATX-X65L (MISCELLANEOUS) IMPLANT
SHEATH CATAPULT 6F 45 MP (SHEATH) IMPLANT
SHEATH PINNACLE 5F 10CM (SHEATH) IMPLANT
SHEATH PINNACLE 6F 10CM (SHEATH) IMPLANT
SHEATH PROBE COVER 6X72 (BAG) IMPLANT
STENT ELUVIA 7X150X130 (Permanent Stent) IMPLANT
TRAY PV CATH (CUSTOM PROCEDURE TRAY) ×1 IMPLANT
WIRE BENTSON .035X145CM (WIRE) IMPLANT
WIRE G V18X300CM (WIRE) IMPLANT
WIRE SPARTACORE .014X300CM (WIRE) IMPLANT

## 2023-10-17 NOTE — Progress Notes (Signed)
 Patient and wife was given discharge instructions. Both verbalized understanding.

## 2023-10-17 NOTE — Interval H&P Note (Signed)
 History and Physical Interval Note:  10/17/2023 7:43 AM  [name]  has presented today for surgery, with the diagnosis of critical lib ischemia rt lower ext.  The various methods of treatment have been discussed with the patient and family. After consideration of risks, benefits and other options for treatment, the patient has consented to  Procedure(s): ABDOMINAL AORTOGRAM W/LOWER EXTREMITY (N/A) LOWER EXTREMITY INTERVENTION (N/A) as a surgical intervention.  The patient's history has been reviewed, patient examined, no change in status, stable for surgery.  I have reviewed the patient's chart and labs.  Questions were answered to the patient's satisfaction.     Norman GORMAN Serve

## 2023-10-17 NOTE — Op Note (Signed)
 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 Post-operative diagnosis:  Same Surgeon:  Norman GORMAN Serve, MD Procedure Performed:  Ultrasound-guided stenosis of the left common femoral artery Aortogram and right lower extremity angiogram Third order cannulation of right popliteal artery Intravascular lithotripsy of right SFA and popliteal arteries with 5 mm shockwave E8 Stenting of right SFA and popliteal arteries with 7 mm Eluvia stents Drug-coated balloon angioplasty of proximal right SFA with 6 mm Ranger Mynx closure of left common femoral artery 89 minutes of moderate sedation with fentanyl  and Versed     Indications: Randy Jacobson is a 78 year old male with PAD who has had multiple interventions.  The left leg has a femoral-popliteal bypass.  The right recently underwent a common femoral endarterectomy.  He presents clinic for follow-up he was noted to have a right fifth toe wound and ABIs demonstrating severe arterial insufficiency with a toe pressure of 0.  Risks and benefits of angiogram with intervention were reviewed, he expressed understanding elected to proceed.  Findings:  Widely patent aorta and bilateral renal arteries. Widely patent common iliac stents.  Widely patent external iliac stent on the right. The right common femoral endarterectomy site is widely patent.  The profunda is patent although there is an approximate 50% stenosis.  The SFA is patent for the first 8 to 10 cm's but has severe with a proximal stenosis of about 70%.  The SFA then occludes and the above-knee popliteal artery reconstitutes via collaterals.  The popliteal artery appears widely patent.  The AT occludes shortly after the turn.  The PT is chronically occluded.  The peroneal is the only runoff to the foot which reconstitutes plantar branches in the DP.   Procedure:  The patient was identified in the holding area  and taken to the cath lab  The patient was then placed supine on the table and prepped and draped in the usual sterile fashion.  A time out was called.  Ultrasound was used to evaluate the left common femoral artery.  It was patent .  A digital ultrasound image was acquired.  A micropuncture needle was used to access the left common femoral artery under ultrasound guidance.  An 018 wire was advanced without resistance and a micropuncture sheath was placed.  The 018 wire was removed and a benson wire was placed.  The micropuncture sheath was exchanged for a 5 french sheath.  An omniflush catheter was advanced over the wire to the level of L-1.  An abdominal angiogram was obtained.  Next, using the omniflush catheter and a glide advantage wire, the aortic bifurcation was crossed and the catheter was placed into theright external iliac artery and right runoff was obtained. This demonstrated the above findings.  Glide advantage wire was then replaced through the catheter and into the proximal SFA.  The patient was systemically heparinized and the short 5 French sheath was exchanged for a 6 [place] by 45 cm catapult sheath.  The SFA CTO was severely calcific and I first attempted to cross with the glide advantage which was unsuccessful as well as an 018 system which was unsuccessful.  After attempts with different wiring catheter combinations, I was able to cross the SFA occlusion subintimally with a stiff angled Glidewire and a quick cross catheter.  An angiogram via the catheter demonstrated true lumen reentry.  Given the severe calcific disease associated with the occlusion the 035 Glidewire was  exchanged for an 014 [place] core wire and a quick cross catheter times lesion was treated with a 5 mm shockwave E8 balloon.  All 400 pulses and 10 treatments were deployed throughout the above-knee popliteal and SFA.  An angiogram demonstrated flow lumen although there is still severe ratty disease.  Therefore elected to stent  this lesion.  The 014 wire was exchanged for the glide advantage wire to switch back to an 035 system and the lesion was treated with two 7 mm x 150 mm Eluvia stents.  These were deployed from the above-knee popliteal into the proximal third of the SFA and postdilated with a 6 mm Mustang balloon.  Given the patency of the proximal SFA and disease in the send I wanted to try to hold off stenting of this area therefore I treated with balloon angioplasty with a 6 mm Mustang balloon and an angiogram demonstrated an adequate result and therefore a drug-coated balloon angioplasty was performed of this area with a 6 mm x 80 mm Ranger.  Completion angiogram demonstrated wide patency of the treated segments with less than 30% residual stenosis in most areas.  There was a short area in the mid SFA that was stented but still had an approximate 50% residual stenosis although this was treated with a high-pressure inflation with a 6 mm balloon and did not give much.  Completion angiogram demonstrated preserved runoff via the peroneal.  The long 6 French sheath was exchanged for a short 6 French sheath and a minx closure device was deployed with excellent hemostasis in the left common femoral artery.  Contrast: 80 cc  Sedation: 89 minutes   Impression: Successful recanalization of the right SFA CTO, preserved one-vessel runoff via the peroneal.   Norman GORMAN Serve MD [facility] [place] Office: [phone]

## 2023-10-18 ENCOUNTER — Encounter (HOSPITAL_COMMUNITY): Payer: Self-pay | Admitting: Vascular Surgery

## 2023-10-20 DIAGNOSIS — D509 Iron deficiency anemia, unspecified: Secondary | ICD-10-CM | POA: Diagnosis not present

## 2023-10-22 DIAGNOSIS — D509 Iron deficiency anemia, unspecified: Secondary | ICD-10-CM | POA: Diagnosis not present

## 2023-10-25 ENCOUNTER — Other Ambulatory Visit: Payer: Self-pay

## 2023-10-25 DIAGNOSIS — M86171 Other acute osteomyelitis, right ankle and foot: Secondary | ICD-10-CM | POA: Diagnosis not present

## 2023-10-25 DIAGNOSIS — E611 Iron deficiency: Secondary | ICD-10-CM

## 2023-10-25 DIAGNOSIS — E1140 Type 2 diabetes mellitus with diabetic neuropathy, unspecified: Secondary | ICD-10-CM | POA: Diagnosis not present

## 2023-10-25 DIAGNOSIS — M79671 Pain in right foot: Secondary | ICD-10-CM | POA: Diagnosis not present

## 2023-10-25 DIAGNOSIS — E0859 Diabetes mellitus due to underlying condition with other circulatory complications: Secondary | ICD-10-CM | POA: Diagnosis not present

## 2023-10-25 DIAGNOSIS — L89892 Pressure ulcer of other site, stage 2: Secondary | ICD-10-CM | POA: Diagnosis not present

## 2023-10-25 DIAGNOSIS — D509 Iron deficiency anemia, unspecified: Secondary | ICD-10-CM | POA: Diagnosis not present

## 2023-10-25 DIAGNOSIS — E783 Hyperchylomicronemia: Secondary | ICD-10-CM | POA: Diagnosis not present

## 2023-10-25 DIAGNOSIS — D649 Anemia, unspecified: Secondary | ICD-10-CM

## 2023-10-25 DIAGNOSIS — L89810 Pressure ulcer of head, unstageable: Secondary | ICD-10-CM | POA: Diagnosis not present

## 2023-10-25 LAB — OCCULT BLOOD X 1 CARD TO LAB, STOOL
Fecal Occult Bld: POSITIVE — AB
Fecal Occult Bld: POSITIVE — AB
Fecal Occult Bld: POSITIVE — AB

## 2023-10-26 ENCOUNTER — Telehealth: Payer: Self-pay

## 2023-10-26 NOTE — Telephone Encounter (Signed)
-----   Message from Olam Ned sent at 10/26/2023  2:48 PM EDT ----- Please let him know stool cards are positive for blood.  Continue oral iron .  Follow-up as scheduled.

## 2023-10-26 NOTE — Telephone Encounter (Signed)
 Patient voiced understanding, no further questions at this time.

## 2023-10-27 ENCOUNTER — Encounter (HOSPITAL_COMMUNITY): Payer: Self-pay

## 2023-10-27 ENCOUNTER — Telehealth: Payer: Self-pay | Admitting: Emergency Medicine

## 2023-10-27 ENCOUNTER — Ambulatory Visit (INDEPENDENT_AMBULATORY_CARE_PROVIDER_SITE_OTHER)

## 2023-10-27 ENCOUNTER — Ambulatory Visit: Admitting: Podiatry

## 2023-10-27 ENCOUNTER — Encounter: Payer: Self-pay | Admitting: Podiatry

## 2023-10-27 ENCOUNTER — Other Ambulatory Visit: Payer: Self-pay | Admitting: Internal Medicine

## 2023-10-27 VITALS — Ht 71.0 in | Wt 180.0 lb

## 2023-10-27 DIAGNOSIS — L97514 Non-pressure chronic ulcer of other part of right foot with necrosis of bone: Secondary | ICD-10-CM

## 2023-10-27 DIAGNOSIS — L97511 Non-pressure chronic ulcer of other part of right foot limited to breakdown of skin: Secondary | ICD-10-CM | POA: Diagnosis not present

## 2023-10-27 DIAGNOSIS — M86171 Other acute osteomyelitis, right ankle and foot: Secondary | ICD-10-CM

## 2023-10-27 NOTE — Progress Notes (Signed)
 Plan of Care Note for accepted Direct Admit   Patient: Randy Jacobson MRN: [id number]   DOA: (Not on file)  Facility requesting Admit: [facility] Requesting Provider: Lamount Ethan CROME, DPM Reason for transfer: Osteomyelitis Facility course:   Patient with chronic wound for at least 2 months.  Also issues with PAD status post revascularization on 7/7 by vascular surgery.  History of prior femoral endarterectomy as well.  Ulceration of the right metatarsal head and worsened and x-rays were done at podiatry office with evidence of progressive osteomyelitis/bony destruction.  After discussion with patient they agreed for direct admission, need for antibiotics and surgical intervention.  Patient is stable and somewhat hesitant so request was made for direct admission.  Per podiatrist, patient is stable to wait for bed if not available till tomorrow and patient will be given return precautions/precautions to head to the ED earlier.  Please notify podiatry on patient arrival.  Patient will be need to be started on IV antibiotics and get MRI arrival.  Plan of care: The patient is accepted for admission to Telemetry unit, at [facility].  Author: Marsa KATHEE Scurry, MD 10/27/2023  Check [email or web address] for on-call coverage.  Nursing staff, Please call [facility] Admits & Consults System-Wide number on Amion as soon as patient's arrival, so appropriate admitting provider can evaluate the pt.

## 2023-10-27 NOTE — Progress Notes (Signed)
 Chief Complaint  Patient presents with   Wound Check    Pt is here due to wound on the right foot he states that it has been there forever, he is seeing several doctors for this issue, states he does not want an x-ray and has no pain at this time.    HPI: 78 y.o. male presenting today with ulceration to the right fifth metatarsal head.  He states that 2 months.  He states that he had been seen by the foot doctor for some time but intervention had been done for it.  He did undergo revascularization with Dr. Pearline on 7/7 as patient has history of critical limb ischemia, following procedure was noted to have one-vessel runoff via the peroneal artery with reconstitution of the dorsal and plantar vessels via the peroneal.  Previously had undergone femoral endarterectomy.  He states that he has been on doxycycline.  He is diabetic, last recorded A1c from December 2024 is 6.2.  Past Medical History:  Diagnosis Date   Diabetes mellitus without complication (HCC)    Dysrhythmia    chronic RBBB; bradycardia s/p atropine  following sheath removal, 3.8 second sinus pause 10/11/22   Hypertension    Peripheral arterial disease (HCC)    Peripheral artery disease (HCC)    Past Surgical History:  Procedure Laterality Date   stents in both my legs     ABDOMINAL AORTOGRAM W/LOWER EXTREMITY N/A 10/11/2022   Procedure: ABDOMINAL AORTOGRAM W/LOWER EXTREMITY;  Surgeon: Eliza Lonni RAMAN, MD;  Location: [facility] INVASIVE CV LAB;  Service: Cardiovascular;  Laterality: N/A;   ABDOMINAL AORTOGRAM W/LOWER EXTREMITY Left 06/20/2023   Procedure: ABDOMINAL AORTOGRAM W/LOWER EXTREMITY;  Surgeon: Pearline Norman RAMAN, MD;  Location: [facility] INVASIVE CV LAB;  Service: Cardiovascular;  Laterality: Left;   ABDOMINAL AORTOGRAM W/LOWER EXTREMITY N/A 10/17/2023   Procedure: ABDOMINAL AORTOGRAM W/LOWER EXTREMITY;  Surgeon: Pearline Norman RAMAN, MD;  Location: [facility] INVASIVE CV LAB;  Service: Cardiovascular;  Laterality: N/A;   COLONOSCOPY WITH  PROPOFOL  N/A 01/18/2023   Procedure: COLONOSCOPY WITH PROPOFOL ;  Surgeon: Saintclair Jasper, MD;  Location: [facility] ENDOSCOPY;  Service: Gastroenterology;  Laterality: N/A;   ENDARTERECTOMY FEMORAL Left 10/15/2022   Procedure: ENDARTERECTOMY OF LEFT ILIOFEMORAL ARTERY;  Surgeon: Eliza Lonni RAMAN, MD;  Location: [facility] OR;  Service: Vascular;  Laterality: Left;   ENDARTERECTOMY FEMORAL Right 06/23/2023   Procedure: COMMON FEMORAL ENDARTERECTOMY;  Surgeon: Pearline Norman RAMAN, MD;  Location: [facility] OR;  Service: Vascular;  Laterality: Right;   ESOPHAGOGASTRODUODENOSCOPY (EGD) WITH PROPOFOL  N/A 01/18/2023   Procedure: ESOPHAGOGASTRODUODENOSCOPY (EGD) WITH PROPOFOL ;  Surgeon: Saintclair Jasper, MD;  Location: [facility] ENDOSCOPY;  Service: Gastroenterology;  Laterality: N/A;   FEMORAL-POPLITEAL BYPASS GRAFT Left 10/15/2022   Procedure: LEFT FEMORAL-BELOW KNEE POPLITEAL ARTERY BYPASS WITH VEIN HARVEST OF LEFT GREATER SAPHENOUS VEIN;  Surgeon: Eliza Lonni RAMAN, MD;  Location: [facility] OR;  Service: Vascular;  Laterality: Left;   GIVENS CAPSULE STUDY N/A 04/10/2017   Procedure: GIVENS CAPSULE STUDY;  Surgeon: Dianna Specking, MD;  Location: [facility] ENDOSCOPY;  Service: Endoscopy;  Laterality: N/A;   GIVENS CAPSULE STUDY N/A 01/18/2023   Procedure: GIVENS CAPSULE STUDY;  Surgeon: Saintclair Jasper, MD;  Location: [facility] ENDOSCOPY;  Service: Gastroenterology;  Laterality: N/A;   INSERTION OF ILIAC STENT Right 06/23/2023   Procedure: INSERTION, STENT, ARTERY, ILIAC;  Surgeon: Pearline Norman RAMAN, MD;  Location: [facility] OR;  Service: Vascular;  Laterality: Right;   IR GENERIC HISTORICAL  08/26/2015   IR RADIOLOGIST EVAL & MGMT 08/26/2015 Wilkie Lent, MD [facility] INTERV RAD  IR GENERIC HISTORICAL  04/21/2016   IR RADIOLOGIST EVAL & MGMT 04/21/2016 Wilkie Lent, MD [facility] INTERV RAD   LOWER EXTREMITY ANGIOGRAM Right 06/23/2023   Procedure: GERALYN, LOWER EXTREMITY;  Surgeon: Pearline Norman RAMAN, MD;  Location: [facility] OR;  Service: Vascular;  Laterality: Right;    LOWER EXTREMITY INTERVENTION Left 06/20/2023   Procedure: LOWER EXTREMITY INTERVENTION;  Surgeon: Pearline Norman RAMAN, MD;  Location: [facility] INVASIVE CV LAB;  Service: Cardiovascular;  Laterality: Left;  DCB   LOWER EXTREMITY INTERVENTION N/A 10/17/2023   Procedure: LOWER EXTREMITY INTERVENTION;  Surgeon: Pearline Norman RAMAN, MD;  Location: [facility] INVASIVE CV LAB;  Service: Cardiovascular;  Laterality: N/A;   PATCH ANGIOPLASTY Left 10/15/2022   Procedure: PATCH ANGIOPLASTY USING XENOSURE BIOLOGIC PATCH 1CMX14CM;  Surgeon: Eliza Lonni RAMAN, MD;  Location: [facility] OR;  Service: Vascular;  Laterality: Left;   PATCH ANGIOPLASTY Right 06/23/2023   Procedure: ANGIOPLASTY, USING PATCH GRAFT 6CM GEORGE;  Surgeon: Pearline Norman RAMAN, MD;  Location: [facility] OR;  Service: Vascular;  Laterality: Right;   PERIPHERAL VASCULAR INTERVENTION  10/11/2022   Procedure: PERIPHERAL VASCULAR INTERVENTION;  Surgeon: Eliza Lonni RAMAN, MD;  Location: [facility] INVASIVE CV LAB;  Service: Cardiovascular;;   No Known Allergies Smoker?: former smoker, quit 1995   PHYSICAL EXAM: There were no vitals filed for this visit.  General: The patient is alert and oriented x3 in no acute distress.  Dermatology: Ulceration present right fifth metatarsal head that does probe to bone.  There is significant fibrous slough and mild to moderate serous drainage.  No significant malodor.  Does have surrounding erythema and edema about the lateral forefoot.  Predebridement measures 1 x 0.8 cm.  Postdebridement measurements 1.2 x 1 cm  Vascular: Trace pedal pulses, difficult to palpate.  Does have some pedal hair growth.  Right forefoot edema present with some erythema.  Neurological: Light touch sensation decreased to toes.  Protective sensation absent.  Musculoskeletal Exam: Muscle strength 5/5 for all major muscle groups.     Latest Ref Rng & Units 03/18/2023    8:41 AM  Hemoglobin A1C  Hemoglobin-A1c <5.7 % of total Hgb 6.2      RADIOGRAPHIC EXAM: Right  foot 3 views weightbearing 10/27/2023 Right forefoot appreciated at the level of the lateral fifth MPJ.  There is osteolysis with destruction of the fifth metatarsal head.  Questionable periosteal reaction seen base of fifth toe proximal phalanx lateral aspect. Impression of osteomyelitis with progression seen from prior imaging on 08/22/2023.  ASSESSMENT / PLAN OF CARE: 1. Ulcer of right foot with necrosis of bone (HCC)   2. Acute osteomyelitis of metatarsal bone of right foot (HCC)      No orders of the defined types were placed in this encounter.  None  # Right fifth metatarsal head ulceration with necrosis of bone Culture swabs of the wound(s) were obtained today.  The ulceration was sharply debrided of hyperkeratotic and devitalized soft tissue with sterile #15 blade and tissue nipper to the level of muscle, fascia, periosteum.  Hemostasis obtained.  Betadine wet-to-dry dressing applied.  # Right foot osteomyelitis Radiographs were reviewed with the patient demonstrating osteomyelitis.  Explained need for surgical resection of the affected bone given the extent of the bone loss.  After discussion with patient and with wife, we set up direct admission to [facility].  He will need MRI, initiation of IV antibiotics, infectious workup.  He will need surgical intervention, at least partial fifth ray resection.  Given his PAD, he is at  high risk of limb loss.  Return for Osteomyelitis.   Ethan LITTIE Saddler, DPM, AACFAS [facility]     [address] [place], [place] [address]                Office [phone]  Fax [phone]

## 2023-10-28 ENCOUNTER — Inpatient Hospital Stay (HOSPITAL_COMMUNITY)
Admission: AD | Admit: 2023-10-28 | Discharge: 2023-10-31 | DRG: 617 | Disposition: A | Discharge status: Home / Self Care | Attending: Internal Medicine | Admitting: Internal Medicine

## 2023-10-28 ENCOUNTER — Inpatient Hospital Stay (HOSPITAL_COMMUNITY)

## 2023-10-28 DIAGNOSIS — E11621 Type 2 diabetes mellitus with foot ulcer: Secondary | ICD-10-CM | POA: Diagnosis present

## 2023-10-28 DIAGNOSIS — Z7984 Long term (current) use of oral hypoglycemic drugs: Secondary | ICD-10-CM

## 2023-10-28 DIAGNOSIS — Z794 Long term (current) use of insulin: Secondary | ICD-10-CM | POA: Diagnosis not present

## 2023-10-28 DIAGNOSIS — J449 Chronic obstructive pulmonary disease, unspecified: Secondary | ICD-10-CM | POA: Diagnosis present

## 2023-10-28 DIAGNOSIS — M86171 Other acute osteomyelitis, right ankle and foot: Secondary | ICD-10-CM | POA: Diagnosis not present

## 2023-10-28 DIAGNOSIS — D72829 Elevated white blood cell count, unspecified: Secondary | ICD-10-CM | POA: Diagnosis present

## 2023-10-28 DIAGNOSIS — E1169 Type 2 diabetes mellitus with other specified complication: Secondary | ICD-10-CM | POA: Diagnosis not present

## 2023-10-28 DIAGNOSIS — M86471 Chronic osteomyelitis with draining sinus, right ankle and foot: Secondary | ICD-10-CM

## 2023-10-28 DIAGNOSIS — B964 Proteus (mirabilis) (morganii) as the cause of diseases classified elsewhere: Secondary | ICD-10-CM | POA: Diagnosis not present

## 2023-10-28 DIAGNOSIS — M19071 Primary osteoarthritis, right ankle and foot: Secondary | ICD-10-CM | POA: Diagnosis not present

## 2023-10-28 DIAGNOSIS — M869 Osteomyelitis, unspecified: Principal | ICD-10-CM | POA: Diagnosis present

## 2023-10-28 DIAGNOSIS — Z95820 Peripheral vascular angioplasty status with implants and grafts: Secondary | ICD-10-CM | POA: Diagnosis not present

## 2023-10-28 DIAGNOSIS — Z7982 Long term (current) use of aspirin: Secondary | ICD-10-CM

## 2023-10-28 DIAGNOSIS — E119 Type 2 diabetes mellitus without complications: Secondary | ICD-10-CM

## 2023-10-28 DIAGNOSIS — I739 Peripheral vascular disease, unspecified: Secondary | ICD-10-CM | POA: Diagnosis not present

## 2023-10-28 DIAGNOSIS — Z7902 Long term (current) use of antithrombotics/antiplatelets: Secondary | ICD-10-CM | POA: Diagnosis not present

## 2023-10-28 DIAGNOSIS — Z89421 Acquired absence of other right toe(s): Secondary | ICD-10-CM | POA: Diagnosis not present

## 2023-10-28 DIAGNOSIS — D649 Anemia, unspecified: Secondary | ICD-10-CM | POA: Diagnosis not present

## 2023-10-28 DIAGNOSIS — E785 Hyperlipidemia, unspecified: Secondary | ICD-10-CM | POA: Diagnosis present

## 2023-10-28 DIAGNOSIS — E1140 Type 2 diabetes mellitus with diabetic neuropathy, unspecified: Secondary | ICD-10-CM | POA: Diagnosis not present

## 2023-10-28 DIAGNOSIS — M65941 Unspecified synovitis and tenosynovitis, right hand: Secondary | ICD-10-CM | POA: Diagnosis not present

## 2023-10-28 DIAGNOSIS — K219 Gastro-esophageal reflux disease without esophagitis: Secondary | ICD-10-CM | POA: Diagnosis not present

## 2023-10-28 DIAGNOSIS — E1151 Type 2 diabetes mellitus with diabetic peripheral angiopathy without gangrene: Secondary | ICD-10-CM | POA: Diagnosis present

## 2023-10-28 DIAGNOSIS — I10 Essential (primary) hypertension: Secondary | ICD-10-CM | POA: Diagnosis present

## 2023-10-28 DIAGNOSIS — L97519 Non-pressure chronic ulcer of other part of right foot with unspecified severity: Secondary | ICD-10-CM | POA: Diagnosis not present

## 2023-10-28 DIAGNOSIS — I700 Atherosclerosis of aorta: Secondary | ICD-10-CM | POA: Diagnosis not present

## 2023-10-28 DIAGNOSIS — R0602 Shortness of breath: Secondary | ICD-10-CM | POA: Diagnosis not present

## 2023-10-28 DIAGNOSIS — L089 Local infection of the skin and subcutaneous tissue, unspecified: Secondary | ICD-10-CM | POA: Diagnosis present

## 2023-10-28 DIAGNOSIS — Z87891 Personal history of nicotine dependence: Secondary | ICD-10-CM | POA: Diagnosis not present

## 2023-10-28 LAB — COMPREHENSIVE METABOLIC PANEL WITH GFR
ALT: 12 U/L (ref 0–44)
AST: 16 U/L (ref 15–41)
Albumin: 3.6 g/dL (ref 3.5–5.0)
Alkaline Phosphatase: 82 U/L (ref 38–126)
Anion gap: 10 (ref 5–15)
BUN: 12 mg/dL (ref 8–23)
CO2: 25 mmol/L (ref 22–32)
Calcium: 9.4 mg/dL (ref 8.9–10.3)
Chloride: 100 mmol/L (ref 98–111)
Creatinine, Ser: 0.8 mg/dL (ref 0.61–1.24)
GFR, Estimated: >60 mL/min (ref >60)
Glucose, Bld: 190 mg/dL — ABNORMAL HIGH (ref 70–99)
Potassium: 4.2 mmol/L (ref 3.5–5.1)
Sodium: 135 mmol/L (ref 135–145)
Total Bilirubin: 0.7 mg/dL (ref 0.0–1.2)
Total Protein: 7.3 g/dL (ref 6.5–8.1)

## 2023-10-28 LAB — CBC WITH DIFFERENTIAL/PLATELET
Abs Immature Granulocytes: 0.21 K/uL — ABNORMAL HIGH (ref 0.00–0.07)
Basophils Absolute: 0.1 K/uL (ref 0.0–0.1)
Basophils Relative: 1 %
Eosinophils Absolute: 0.2 K/uL (ref 0.0–0.5)
Eosinophils Relative: 2 %
HCT: 32 % — ABNORMAL LOW (ref 39.0–52.0)
Hemoglobin: 10.3 g/dL — ABNORMAL LOW (ref 13.0–17.0)
Immature Granulocytes: 2 %
Lymphocytes Relative: 8 %
Lymphs Abs: 1.1 K/uL (ref 0.7–4.0)
MCH: 29.8 pg (ref 26.0–34.0)
MCHC: 32.2 g/dL (ref 30.0–36.0)
MCV: 92.5 fL (ref 80.0–100.0)
Monocytes Absolute: 1.4 K/uL — ABNORMAL HIGH (ref 0.1–1.0)
Monocytes Relative: 11 %
Neutro Abs: 10.2 K/uL — ABNORMAL HIGH (ref 1.7–7.7)
Neutrophils Relative %: 76 %
Platelets: 305 K/uL (ref 150–400)
RBC: 3.46 MIL/uL — ABNORMAL LOW (ref 4.22–5.81)
RDW: 15.2 % (ref 11.5–15.5)
WBC: 13.3 K/uL — ABNORMAL HIGH (ref 4.0–10.5)
nRBC: 0 % (ref 0.0–0.2)

## 2023-10-28 LAB — SEDIMENTATION RATE: Sed Rate: 50 mm/h — ABNORMAL HIGH (ref 0–16)

## 2023-10-28 LAB — HEMOGLOBIN A1C
Hgb A1c MFr Bld: 5.9 % — ABNORMAL HIGH (ref 4.8–5.6)
Mean Plasma Glucose: 122.63 mg/dL

## 2023-10-28 LAB — PREALBUMIN: Prealbumin: 20 mg/dL (ref 18–38)

## 2023-10-28 LAB — GLUCOSE, CAPILLARY
Glucose-Capillary: 149 mg/dL — ABNORMAL HIGH (ref 70–99)
Glucose-Capillary: 242 mg/dL — ABNORMAL HIGH (ref 70–99)

## 2023-10-28 LAB — C-REACTIVE PROTEIN: CRP: 1.4 mg/dL — ABNORMAL HIGH (ref <1.0)

## 2023-10-28 MED ORDER — ENOXAPARIN SODIUM 40 MG/0.4ML IJ SOSY
40.0000 mg | PREFILLED_SYRINGE | INTRAMUSCULAR | Status: DC
  Administered 2023-10-28 – 2023-10-31 (×3): 40 mg via SUBCUTANEOUS
  Filled 2023-10-28 (×3): qty 0.4

## 2023-10-28 MED ORDER — INSULIN ASPART 100 UNIT/ML IJ SOLN: 0.0000 [IU] | Freq: Three times a day (TID) | INTRAMUSCULAR | Status: DC

## 2023-10-28 MED ORDER — INSULIN GLARGINE-YFGN 100 UNIT/ML ~~LOC~~ SOLN
20.0000 [IU] | SUBCUTANEOUS | Status: DC
  Administered 2023-10-28 – 2023-10-30 (×3): 20 [IU] via SUBCUTANEOUS
  Filled 2023-10-28 (×5): qty 0.2

## 2023-10-28 MED ORDER — INSULIN DEGLUDEC 100 UNIT/ML ~~LOC~~ SOPN: 20.0000 [IU] | PEN_INJECTOR | SUBCUTANEOUS | Status: DC

## 2023-10-28 MED ORDER — ACETAMINOPHEN 650 MG RE SUPP: 650.0000 mg | Freq: Four times a day (QID) | RECTAL | Status: DC | PRN

## 2023-10-28 MED ORDER — INSULIN ASPART 100 UNIT/ML IJ SOLN
10.0000 [IU] | Freq: Once | INTRAMUSCULAR | Status: AC
  Administered 2023-10-28: 10 [IU] via SUBCUTANEOUS

## 2023-10-28 MED ORDER — ATORVASTATIN CALCIUM 40 MG PO TABS
40.0000 mg | ORAL_TABLET | Freq: Every day | ORAL | Status: DC
Start: 2023-10-28 — End: 2023-10-31
  Administered 2023-10-28 – 2023-10-31 (×4): 40 mg via ORAL
  Filled 2023-10-28 (×4): qty 1

## 2023-10-28 MED ORDER — SODIUM CHLORIDE 0.9 % IV SOLN
3.0000 g | Freq: Four times a day (QID) | INTRAVENOUS | Status: DC
  Administered 2023-10-28 – 2023-10-30 (×8): 3 g via INTRAVENOUS
  Filled 2023-10-28 (×7): qty 8

## 2023-10-28 MED ORDER — ACETAMINOPHEN 325 MG PO TABS
650.0000 mg | ORAL_TABLET | Freq: Four times a day (QID) | ORAL | Status: DC | PRN
  Administered 2023-10-30: 650 mg via ORAL
  Filled 2023-10-28: qty 2

## 2023-10-28 MED ORDER — LINEZOLID 600 MG/300ML IV SOLN
600.0000 mg | Freq: Two times a day (BID) | INTRAVENOUS | Status: DC
Start: 2023-10-28 — End: 2023-11-04
  Administered 2023-10-28 – 2023-10-29 (×3): 600 mg via INTRAVENOUS
  Filled 2023-10-28 (×5): qty 300

## 2023-10-28 MED ORDER — HYDRALAZINE HCL 20 MG/ML IJ SOLN
10.0000 mg | INTRAMUSCULAR | Status: DC | PRN
  Administered 2023-10-29: 10 mg via INTRAVENOUS
  Filled 2023-10-28: qty 1

## 2023-10-28 MED ORDER — ASPIRIN 81 MG PO TBEC
81.0000 mg | DELAYED_RELEASE_TABLET | Freq: Every day | ORAL | Status: DC
  Administered 2023-10-29 – 2023-10-31 (×3): 81 mg via ORAL
  Filled 2023-10-28 (×3): qty 1

## 2023-10-28 MED ORDER — ALBUTEROL SULFATE (2.5 MG/3ML) 0.083% IN NEBU: 2.5000 mg | INHALATION_SOLUTION | Freq: Four times a day (QID) | RESPIRATORY_TRACT | Status: DC | PRN

## 2023-10-28 MED ORDER — INSULIN DEGLUDEC 100 UNIT/ML ~~LOC~~ SOPN: 30.0000 [IU] | PEN_INJECTOR | SUBCUTANEOUS | Status: DC

## 2023-10-28 MED ORDER — MELATONIN 5 MG PO TABS
10.0000 mg | ORAL_TABLET | Freq: Every evening | ORAL | Status: DC | PRN
  Administered 2023-10-28 – 2023-10-30 (×3): 10 mg via ORAL
  Filled 2023-10-28 (×3): qty 2

## 2023-10-28 MED ORDER — SODIUM CHLORIDE 0.9% FLUSH
3.0000 mL | Freq: Two times a day (BID) | INTRAVENOUS | Status: DC
  Administered 2023-10-28 – 2023-10-31 (×6): 3 mL via INTRAVENOUS

## 2023-10-28 MED ORDER — ONDANSETRON HCL 4 MG PO TABS: 4.0000 mg | ORAL_TABLET | Freq: Four times a day (QID) | ORAL | Status: DC | PRN

## 2023-10-28 MED ORDER — ONDANSETRON HCL 4 MG/2ML IJ SOLN: 4.0000 mg | Freq: Four times a day (QID) | INTRAMUSCULAR | Status: DC | PRN

## 2023-10-28 NOTE — H&P (Signed)
 History and Physical    Patient: Randy Jacobson FMW:[id number] DOB: 1945/05/07 DOA: 10/28/2023 DOS: the patient was seen and examined on 10/28/2023 PCP: Phyllis Jereld BROCKS, NP  Patient coming from: Home  Chief Complaint: Right foot ulcer  HPI: Randy Jacobson is a 78 y.o. male with medical history significant of hypertension, diabetes mellitus type 2, and peripheral vascular disease presents due to worsening ulcer of his right fifth toe.  Approximately 6 - 8 weeks ago, he developed a blister on his right foot.  Despite regular wound management by a podiatrist, including bi-weekly visits and nightly wound wrapping, the wound had progressed.  He previously had no pain but over the last couple days had experienced severe pain in his foot rating it as a 9 out of 10 on the pain scale.  He has been on doxycycline for treatment.  Of note he had just undergone revascularization with Dr. Pearline on 7/7.  Patient had history of right leg critical limb ischemia, following procedure was noted to successful recanalization of the chronic total occlusion in the superficial femoral artery of the right leg, preserved one-vessel runoff via the peroneal.  Of note patient had also undergone right common femoral endarterectomy with profundoplasty and right external iliac stenting on 06/24/2023.  He was seen in the podiatry office yesterday where he was probed and x-rays were done with evidence of progressive osteomyelitis/bony destruction.  Direct admission was requested for need of IV antibiotics, MRI, and likely need of surgery.   Review of Systems: As mentioned in the history of present illness. All other systems reviewed and are negative. Past Medical History:  Diagnosis Date   Diabetes mellitus without complication (HCC)    Dysrhythmia    chronic RBBB; bradycardia s/p atropine  following sheath removal, 3.8 second sinus pause 10/11/22   Hypertension    Peripheral arterial disease (HCC)    Peripheral artery  disease (HCC)    Past Surgical History:  Procedure Laterality Date   stents in both my legs     ABDOMINAL AORTOGRAM W/LOWER EXTREMITY N/A 10/11/2022   Procedure: ABDOMINAL AORTOGRAM W/LOWER EXTREMITY;  Surgeon: Eliza Lonni RAMAN, MD;  Location: [facility] INVASIVE CV LAB;  Service: Cardiovascular;  Laterality: N/A;   ABDOMINAL AORTOGRAM W/LOWER EXTREMITY Left 06/20/2023   Procedure: ABDOMINAL AORTOGRAM W/LOWER EXTREMITY;  Surgeon: Pearline Norman RAMAN, MD;  Location: [facility] INVASIVE CV LAB;  Service: Cardiovascular;  Laterality: Left;   ABDOMINAL AORTOGRAM W/LOWER EXTREMITY N/A 10/17/2023   Procedure: ABDOMINAL AORTOGRAM W/LOWER EXTREMITY;  Surgeon: Pearline Norman RAMAN, MD;  Location: [facility] INVASIVE CV LAB;  Service: Cardiovascular;  Laterality: N/A;   COLONOSCOPY WITH PROPOFOL  N/A 01/18/2023   Procedure: COLONOSCOPY WITH PROPOFOL ;  Surgeon: Saintclair Jasper, MD;  Location: [facility] ENDOSCOPY;  Service: Gastroenterology;  Laterality: N/A;   ENDARTERECTOMY FEMORAL Left 10/15/2022   Procedure: ENDARTERECTOMY OF LEFT ILIOFEMORAL ARTERY;  Surgeon: Eliza Lonni RAMAN, MD;  Location: [facility] OR;  Service: Vascular;  Laterality: Left;   ENDARTERECTOMY FEMORAL Right 06/23/2023   Procedure: COMMON FEMORAL ENDARTERECTOMY;  Surgeon: Pearline Norman RAMAN, MD;  Location: [facility] OR;  Service: Vascular;  Laterality: Right;   ESOPHAGOGASTRODUODENOSCOPY (EGD) WITH PROPOFOL  N/A 01/18/2023   Procedure: ESOPHAGOGASTRODUODENOSCOPY (EGD) WITH PROPOFOL ;  Surgeon: Saintclair Jasper, MD;  Location: [facility] ENDOSCOPY;  Service: Gastroenterology;  Laterality: N/A;   FEMORAL-POPLITEAL BYPASS GRAFT Left 10/15/2022   Procedure: LEFT FEMORAL-BELOW KNEE POPLITEAL ARTERY BYPASS WITH VEIN HARVEST OF LEFT GREATER SAPHENOUS VEIN;  Surgeon: Eliza Lonni RAMAN, MD;  Location: [facility] OR;  Service: Vascular;  Laterality:  Left;   GIVENS CAPSULE STUDY N/A 04/10/2017   Procedure: GIVENS CAPSULE STUDY;  Surgeon: Dianna Specking, MD;  Location: [facility] ENDOSCOPY;  Service: Endoscopy;  Laterality:  N/A;   GIVENS CAPSULE STUDY N/A 01/18/2023   Procedure: GIVENS CAPSULE STUDY;  Surgeon: Saintclair Jasper, MD;  Location: [facility] ENDOSCOPY;  Service: Gastroenterology;  Laterality: N/A;   INSERTION OF ILIAC STENT Right 06/23/2023   Procedure: INSERTION, STENT, ARTERY, ILIAC;  Surgeon: Pearline Norman RAMAN, MD;  Location: [facility] OR;  Service: Vascular;  Laterality: Right;   IR GENERIC HISTORICAL  08/26/2015   IR RADIOLOGIST EVAL & MGMT 08/26/2015 Wilkie Lent, MD [facility] INTERV RAD   IR GENERIC HISTORICAL  04/21/2016   IR RADIOLOGIST EVAL & MGMT 04/21/2016 Wilkie Lent, MD [facility] INTERV RAD   LOWER EXTREMITY ANGIOGRAM Right 06/23/2023   Procedure: GERALYN, LOWER EXTREMITY;  Surgeon: Pearline Norman RAMAN, MD;  Location: [facility] OR;  Service: Vascular;  Laterality: Right;   LOWER EXTREMITY INTERVENTION Left 06/20/2023   Procedure: LOWER EXTREMITY INTERVENTION;  Surgeon: Pearline Norman RAMAN, MD;  Location: [facility] INVASIVE CV LAB;  Service: Cardiovascular;  Laterality: Left;  DCB   LOWER EXTREMITY INTERVENTION N/A 10/17/2023   Procedure: LOWER EXTREMITY INTERVENTION;  Surgeon: Pearline Norman RAMAN, MD;  Location: [facility] INVASIVE CV LAB;  Service: Cardiovascular;  Laterality: N/A;   PATCH ANGIOPLASTY Left 10/15/2022   Procedure: PATCH ANGIOPLASTY USING XENOSURE BIOLOGIC PATCH 1CMX14CM;  Surgeon: Eliza Lonni RAMAN, MD;  Location: [facility] OR;  Service: Vascular;  Laterality: Left;   PATCH ANGIOPLASTY Right 06/23/2023   Procedure: ANGIOPLASTY, USING PATCH GRAFT 6CM GEORGE;  Surgeon: Pearline Norman RAMAN, MD;  Location: [facility] OR;  Service: Vascular;  Laterality: Right;   PERIPHERAL VASCULAR INTERVENTION  10/11/2022   Procedure: PERIPHERAL VASCULAR INTERVENTION;  Surgeon: Eliza Lonni RAMAN, MD;  Location: [facility] INVASIVE CV LAB;  Service: Cardiovascular;;   Social History:  reports that he quit smoking about 29 years ago. His smoking use included cigarettes. He started smoking about 65 years ago. He has a 36 pack-year smoking history. He has never used  smokeless tobacco. He reports current alcohol use. He reports that he does not use drugs.  No Known Allergies  Family History  Problem Relation Age of Onset   Diabetes Brother     Prior to Admission medications   Medication Sig Start Date End Date Taking? Authorizing Provider  Ascorbic Acid (VITAMIN C ) 1000 MG tablet Take 1 tablet (1,000 mg total) by mouth daily. 05/18/23   Medina-Vargas, Monina C, NP  aspirin  EC 81 MG tablet Take 1 tablet (81 mg total) by mouth daily at 6 (six) AM. Swallow whole. 10/19/22   Schuh, McKenzi P, PA-C  atorvastatin  (LIPITOR) 40 MG tablet Take 1 tablet (40 mg total) by mouth daily. 08/15/23   Medina-Vargas, Monina C, NP  clopidogrel  (PLAVIX ) 75 MG tablet Take 1 tablet (75 mg total) by mouth daily. 05/18/23   Medina-Vargas, Monina C, NP  Continuous Glucose Receiver (FREESTYLE LIBRE 2 READER) DEVI 1 Device by Does not apply route 3 (three) times daily. E11.69 04/18/23   Medina-Vargas, Monina C, NP  Continuous Glucose Sensor (FREESTYLE LIBRE 2 SENSOR) MISC 2 Devices by Does not apply route daily. E11.69 04/18/23   Medina-Vargas, Monina C, NP  ferrous sulfate  325 (65 FE) MG tablet Take 1 tablet (325 mg total) by mouth daily with breakfast. 10/12/23   Medina-Vargas, Monina C, NP  gabapentin  (NEURONTIN ) 100 MG capsule Take 1 capsule (100 mg total) by mouth 3 (three) times daily. 09/07/23  Ngetich, Dinah C, NP  hydrochlorothiazide  (HYDRODIURIL ) 25 MG tablet Take 0.5 tablets (12.5 mg total) by mouth daily. 08/04/23   Medina-Vargas, Monina C, NP  losartan  (COZAAR ) 50 MG tablet Take 1 tablet (50 mg total) by mouth in the morning and at bedtime. 07/19/23   Medina-Vargas, Monina C, NP  Melatonin 10 MG TABS Take 10 mg by mouth at bedtime.    [provider]  metFORMIN  (GLUCOPHAGE ) 1000 MG tablet Take 1 tablet (1,000 mg total) by mouth 2 (two) times daily with a meal. 03/29/23   Medina-Vargas, Monina C, NP  pantoprazole  (PROTONIX ) 40 MG tablet Take 1 tablet (40 mg total) by mouth daily.  09/08/23   Medina-Vargas, Monina C, NP  sitaGLIPtin  (JANUVIA ) 50 MG tablet Take 1 tablet (50 mg total) by mouth daily as needed (for high blood sugar). 09/20/23   Medina-Vargas, Monina C, NP  TRESIBA  FLEXTOUCH 100 UNIT/ML FlexTouch Pen Inject 38 Units into the skin daily. 08/15/23   Medina-Vargas, Jereld C, NP    Physical Exam: Vitals:   10/28/23 0926 10/28/23 1211  BP: (!) 147/77 (!) 143/87  Pulse: 87 87  Resp: 18 (!) 21  Temp:  (!) 97.4 F (36.3 C)  TempSrc:  Oral  SpO2: 96% 96%   Constitutional: Elderly male currently in no acute distress eyes: PERRL, lids and conjunctivae normal ENMT: Mucous membranes are moist. Posterior pharynx clear of any exudate or lesions.Normal dentition.  Neck: normal, supple  Respiratory: clear to auscultation bilaterally, no wheezing, no crackles. Normal respiratory effort. No accessory muscle use.  Cardiovascular: Regular rate and rhythm, no murmurs / rubs / gallops.  Unable to palpate pulses in the right foot. Abdomen: no tenderness, no masses palpated.  Bowel sounds positive.  Musculoskeletal: no clubbing / cyanosis. No joint deformity upper and lower extremities. Good ROM, no contractures. Normal muscle tone.  Skin: Abrasion noted on the lateral aspect of the fifth metatarsal head with mild surrounding erythema and purulent drainage present as shown Neurologic: CN 2-12 grossly intact.  Decreased sensation to light touch lower extremities.  Strength 5/5 in all 4.  Psychiatric: Normal judgment and insight. Alert and oriented x 3.  Anxious mood  Data Reviewed:  Reviewed labs, imaging, and pertinent records as documented.  Significant for WBC 13.3, hemoglobin 10.3, and glucose 190  Assessment and Plan:  Diabetic foot ulcer Osteomyelitis of the right foot Patient presents with a infected diabetic foot ulcer that has been present over over the last 2 months.  Reports being followed by podiatry in the outpatient setting and treated with antibiotics of  doxycycline.  Despite this patient had progression of the wound.  In office patient had wound probed as well as x-rays concerning for progressive osteomyelitis. - Admit to a telemetry bed - Check blood cultures - Check ESR and CRP - Empiric antibiotics of linezolid and Unasyn - Check MRI of the right foot - Podiatry consulted, will follow-up for any further recommendations  Leukocytosis Acute.  WBC elevated at 13.3.  Secondary to above. - Recheck CBC tomorrow morning  Controlled diabetes mellitus type II, with long-term use of insulin  Home medication regimen includes metformin  1000 mg twice daily, Januvia  50 mg daily as needed, and Tresiba  38 units daily. - Hypoglycemia protocols -Check hemoglobin A1c (5.8) - Hold metformin  and Januvia  - Continue Tresiba  reduced to 20 units daily.  Adjust as needed when medically appropriate after procedure - CBGs before every meal with sensitive SSI  Peripheral vascular disease Hyperlipidemia Patient is followed by Dr. Pearline  of vascular surgery.  Patient has had chronic limb threatening ischemia with multiple procedures including 10/15/22: left iliofemoral endarterectomy and left femoral to below-knee popliteal artery bypass.  06/20/2023: drug-coated balloon angioplasty of the left femoral-popliteal bypass distal anastomosis.  06/24/2023: Right common femoral endarterectomy with profundoplasty and right external iliac stenting.  10/17/2023 Recannulation of the right SFA CTO, preserved 1 vessel runoff via the peritoneal - Continue atorvastatin  and aspirin   Essential hypertension Blood pressures elevated up to 147/77.  Home blood pressure regimen includes - Continue losartan  - Held hydrochlorothiazide   Normocytic anemia Chronic.  Hemoglobin 10.3 which appears around patient's baseline. - Continue to monitor  DVT prophylaxis: Advance Care Planning:   Code Status: Full Code  Consults: Podiatry  Family Communication: Friend updated over the phone at  patient request  Severity of Illness: The appropriate patient status for this patient is INPATIENT. Inpatient status is judged to be reasonable and necessary in order to provide the required intensity of service to ensure the patient's safety. The patient's presenting symptoms, physical exam findings, and initial radiographic and laboratory data in the context of their chronic comorbidities is felt to place them at high risk for further clinical deterioration. Furthermore, it is not anticipated that the patient will be medically stable for discharge from the hospital within 2 midnights of admission.   * I certify that at the point of admission it is my clinical judgment that the patient will require inpatient hospital care spanning beyond 2 midnights from the point of admission due to high intensity of service, high risk for further deterioration and high frequency of surveillance required.*  Author: Maximino DELENA Sharps, MD 10/28/2023 8:59 AM  For on call review [email or web address].

## 2023-10-28 NOTE — Plan of Care (Signed)

## 2023-10-28 NOTE — Telephone Encounter (Signed)
 Call was made to patient.

## 2023-10-28 NOTE — Consult Note (Signed)
 PODIATRY CONSULTATION  NAME Randy Jacobson MRN [id number] DOB 11/14/1945 DOA 10/28/2023   Reason for consult: Right foot ulcer, concern for osteomyelitis  Attending/Consulting physician: FABIENE Sharps MD  History of present illness: 78 y.o. male presenting today with ulceration to the right fifth metatarsal head.  He states that 2 months.  He states that he had been seen by the foot doctor for some time but intervention had been done for it.  He did undergo revascularization with Dr. Pearline on 7/7 as patient has history of critical limb ischemia, following procedure was noted to have one-vessel runoff via the peroneal artery with reconstitution of the dorsal and plantar vessels via the peroneal.  Previously had undergone femoral endarterectomy.  He states that he has been on doxycycline.   Patient reports to me that he was previously seeing another provider in [place] for this ulceration on the right foot.  He had been going every 2 weeks and said he had severe pain in his right foot.  Eventually he says he sought out pain medications that were not prescription he does not specify what he took.  Eventually he says the provider and [place] fished a piece of bone or cartilage from the ulceration on the right foot and he says his pain then improved after this.  He also reports that at that point he was referred to Dr. Lamount at our office for further evaluation due to concern for drainage and bone infection.  He was seen yesterday by Dr. Lamount who took an x-ray and was concerned about osteomyelitis at the fifth MPJ.  He is upset that he was seeing the provider in [place] for a long time and the infection worsened.  He also reports he had recent revascularization with the vascular surgery team a few weeks ago.  He does report that he ate a biscuit this morning at 11 AM.  He was admitted this morning.  I discussed the plan for MRI and surgical intervention will be necessary including partial fifth ray amputation which  would include fifth toe amputation as well as some of the bone behind it.  He is aware of this and agreeable to proceed.  I stated that this may take place tomorrow versus Monday he states that he does not want to stay in the hospital and hopes that it can be done tomorrow because he has money trouble from recent hospitalizations.  Past Medical History:  Diagnosis Date   Diabetes mellitus without complication (HCC)    Dysrhythmia    chronic RBBB; bradycardia s/p atropine  following sheath removal, 3.8 second sinus pause 10/11/22   Hypertension    Peripheral arterial disease (HCC)    Peripheral artery disease (HCC)        Latest Ref Rng & Units 10/28/2023    9:47 AM 10/17/2023    5:38 AM 10/06/2023   10:12 AM  CBC  WBC 4.0 - 10.5 K/uL 13.3   8.8   Hemoglobin 13.0 - 17.0 g/dL 89.6  88.3  89.9   Hematocrit 39.0 - 52.0 % 32.0  34.0  30.4   Platelets 150 - 400 K/uL 305   269        Latest Ref Rng & Units 10/28/2023    9:47 AM 10/17/2023    5:38 AM 10/06/2023   11:50 AM  BMP  Glucose 70 - 99 mg/dL 809  898  883   BUN 8 - 23 mg/dL 12  18  17    Creatinine 0.61 - 1.24  mg/dL 9.19  9.09  9.04   Sodium 135 - 145 mmol/L 135  141  138   Potassium 3.5 - 5.1 mmol/L 4.2  3.9  4.8   Chloride 98 - 111 mmol/L 100  103  100   CO2 22 - 32 mmol/L 25   27   Calcium  8.9 - 10.3 mg/dL 9.4   9.6       Physical Exam: Lower Extremity Exam Nonpalpable DP and PT pulses on the right foot  Ulceration present at the lateral aspect of the fifth metatarsal head with mild erythema and maceration of the surrounding tissues.  No active drainage  Diminished sensation significantly to light touch to the forefoot due to neuropathy    RADIOGRAPHIC EXAM: Right foot 3 views weightbearing 10/27/2023 Right forefoot appreciated at the level of the lateral fifth MPJ.  There is osteolysis with destruction of the fifth metatarsal head.  Questionable periosteal reaction seen base of fifth toe proximal phalanx lateral  aspect. Impression of osteomyelitis with progression seen from prior imaging on 08/22/2023.  ASSESSMENT/PLAN OF CARE 78 y.o. 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  WBC 13.3 MRI right foot without contrast: Ordered WOUND cultures taken in office yesterday: Pending  -Patient will require partial fifth ray amputation this admission.  He is aware of this and agreeable to proceed.  Timing to be determined possibly tomorrow versus Monday.  Unable to do this today as he came in this morning, MRI has not been completed as recommended by Dr. Lamount, and he ate this morning.  Hopeful MRI can be completed today. - Continue IV abx broad spectrum pending further culture data - Anticoagulation: Okay to continue per primary/vascular recommendations - Wound care: Applied a Betadine gauze dressing - WB status: Weightbearing as tolerated to right foot - Will continue to follow   Thank you for the consult.  Please contact me directly with any questions or concerns.           Marolyn JULIANNA Honour, DPM [facility]    [address] [place], [place] [address]                Office [phone]  Fax [phone]

## 2023-10-29 ENCOUNTER — Other Ambulatory Visit: Payer: Self-pay

## 2023-10-29 ENCOUNTER — Inpatient Hospital Stay (HOSPITAL_COMMUNITY)

## 2023-10-29 ENCOUNTER — Inpatient Hospital Stay (HOSPITAL_COMMUNITY): Payer: Self-pay | Admitting: Certified Registered"

## 2023-10-29 ENCOUNTER — Encounter (HOSPITAL_COMMUNITY): Payer: Self-pay | Admitting: Internal Medicine

## 2023-10-29 ENCOUNTER — Encounter (HOSPITAL_COMMUNITY)
Admission: AD | Disposition: A | Discharge status: Home / Self Care | Payer: Self-pay | Source: Home / Self Care | Attending: Internal Medicine

## 2023-10-29 DIAGNOSIS — M86171 Other acute osteomyelitis, right ankle and foot: Secondary | ICD-10-CM | POA: Diagnosis not present

## 2023-10-29 DIAGNOSIS — Z87891 Personal history of nicotine dependence: Secondary | ICD-10-CM | POA: Diagnosis not present

## 2023-10-29 DIAGNOSIS — I10 Essential (primary) hypertension: Secondary | ICD-10-CM | POA: Diagnosis not present

## 2023-10-29 DIAGNOSIS — M869 Osteomyelitis, unspecified: Secondary | ICD-10-CM

## 2023-10-29 DIAGNOSIS — M86471 Chronic osteomyelitis with draining sinus, right ankle and foot: Secondary | ICD-10-CM | POA: Diagnosis not present

## 2023-10-29 DIAGNOSIS — E1169 Type 2 diabetes mellitus with other specified complication: Secondary | ICD-10-CM | POA: Diagnosis not present

## 2023-10-29 HISTORY — PX: AMPUTATION: SHX166

## 2023-10-29 LAB — PROCALCITONIN: Procalcitonin: <0.1 ng/mL

## 2023-10-29 LAB — GLUCOSE, CAPILLARY
Glucose-Capillary: 208 mg/dL — ABNORMAL HIGH (ref 70–99)
Glucose-Capillary: 175 mg/dL — ABNORMAL HIGH (ref 70–99)
Glucose-Capillary: 147 mg/dL — ABNORMAL HIGH (ref 70–99)
Glucose-Capillary: 128 mg/dL — ABNORMAL HIGH (ref 70–99)
Glucose-Capillary: 197 mg/dL — ABNORMAL HIGH (ref 70–99)

## 2023-10-29 LAB — C-REACTIVE PROTEIN: CRP: 1.1 mg/dL — ABNORMAL HIGH (ref <1.0)

## 2023-10-29 LAB — MRSA NEXT GEN BY PCR, NASAL: MRSA by PCR Next Gen: NOT DETECTED

## 2023-10-29 SURGERY — AMPUTATION, FOOT, RAY
Anesthesia: Monitor Anesthesia Care | Site: Foot | Laterality: Right

## 2023-10-29 MED ORDER — MORPHINE SULFATE (PF) 2 MG/ML IV SOLN
2.0000 mg | INTRAVENOUS | Status: AC
  Administered 2023-10-29: 2 mg via INTRAVENOUS
  Filled 2023-10-29: qty 1

## 2023-10-29 MED ORDER — PROPOFOL 10 MG/ML IV BOLUS
INTRAVENOUS | Status: DC | PRN
  Administered 2023-10-29 (×2): 20 mg via INTRAVENOUS

## 2023-10-29 MED ORDER — BUPIVACAINE HCL (PF) 0.5 % IJ SOLN
INTRAMUSCULAR | Status: AC
  Filled 2023-10-29: qty 30

## 2023-10-29 MED ORDER — HYDROMORPHONE HCL 1 MG/ML IJ SOLN
0.5000 mg | INTRAMUSCULAR | Status: DC | PRN
  Administered 2023-10-29: 0.5 mg via INTRAVENOUS
  Administered 2023-10-30 – 2023-10-31 (×4): 1 mg via INTRAVENOUS
  Filled 2023-10-29 (×7): qty 1

## 2023-10-29 MED ORDER — LACTATED RINGERS IV SOLN: INTRAVENOUS | Status: DC

## 2023-10-29 MED ORDER — LIDOCAINE HCL (PF) 2 % IJ SOLN
INTRAMUSCULAR | Status: AC
  Filled 2023-10-29: qty 10

## 2023-10-29 MED ORDER — GABAPENTIN 100 MG PO CAPS
100.0000 mg | ORAL_CAPSULE | Freq: Three times a day (TID) | ORAL | Status: DC
  Administered 2023-10-29 – 2023-10-31 (×6): 100 mg via ORAL
  Filled 2023-10-29 (×6): qty 1

## 2023-10-29 MED ORDER — CHLORHEXIDINE GLUCONATE 0.12 % MT SOLN
15.0000 mL | Freq: Once | OROMUCOSAL | Status: AC
  Administered 2023-10-29: 15 mL via OROMUCOSAL

## 2023-10-29 MED ORDER — LIDOCAINE HCL 2 % IJ SOLN
INTRAMUSCULAR | Status: AC
  Filled 2023-10-29: qty 20

## 2023-10-29 MED ORDER — ONDANSETRON HCL 4 MG/2ML IJ SOLN
INTRAMUSCULAR | Status: DC | PRN
  Administered 2023-10-29: 4 mg via INTRAVENOUS

## 2023-10-29 MED ORDER — PANTOPRAZOLE SODIUM 40 MG PO TBEC
40.0000 mg | DELAYED_RELEASE_TABLET | Freq: Every day | ORAL | Status: DC
  Administered 2023-10-29 – 2023-10-31 (×3): 40 mg via ORAL
  Filled 2023-10-29 (×2): qty 1

## 2023-10-29 MED ORDER — IPRATROPIUM-ALBUTEROL 0.5-2.5 (3) MG/3ML IN SOLN
3.0000 mL | Freq: Once | RESPIRATORY_TRACT | Status: AC
  Administered 2023-10-29: 3 mL via RESPIRATORY_TRACT
  Filled 2023-10-29: qty 3

## 2023-10-29 MED ORDER — CLOPIDOGREL BISULFATE 75 MG PO TABS
75.0000 mg | ORAL_TABLET | Freq: Every day | ORAL | Status: DC
  Administered 2023-10-30 – 2023-10-31 (×2): 75 mg via ORAL
  Filled 2023-10-29 (×3): qty 1

## 2023-10-29 MED ORDER — ACETAMINOPHEN 10 MG/ML IV SOLN: 1000.0000 mg | Freq: Once | INTRAVENOUS | Status: DC | PRN

## 2023-10-29 MED ORDER — LIDOCAINE HCL 2 % IJ SOLN
INTRAMUSCULAR | Status: DC | PRN
  Administered 2023-10-29: 10 mL

## 2023-10-29 MED ORDER — CHLORHEXIDINE GLUCONATE 0.12 % MT SOLN
OROMUCOSAL | Status: AC
  Filled 2023-10-29: qty 15

## 2023-10-29 MED ORDER — OXYCODONE HCL 5 MG/5ML PO SOLN: 5.0000 mg | Freq: Once | ORAL | Status: DC | PRN

## 2023-10-29 MED ORDER — PHENYLEPHRINE 80 MCG/ML (10ML) SYRINGE FOR IV PUSH (FOR BLOOD PRESSURE SUPPORT)
PREFILLED_SYRINGE | INTRAVENOUS | Status: DC | PRN
  Administered 2023-10-29 (×2): 80 ug via INTRAVENOUS

## 2023-10-29 MED ORDER — INSULIN ASPART 100 UNIT/ML IJ SOLN
0.0000 [IU] | Freq: Three times a day (TID) | INTRAMUSCULAR | Status: DC
  Administered 2023-10-30: 3 [IU] via SUBCUTANEOUS
  Administered 2023-10-30 – 2023-10-31 (×2): 1 [IU] via SUBCUTANEOUS

## 2023-10-29 MED ORDER — BUPIVACAINE HCL (PF) 0.5 % IJ SOLN
INTRAMUSCULAR | Status: DC | PRN
Start: 2023-10-29 — End: 2023-10-29
  Administered 2023-10-29: 10 mL

## 2023-10-29 MED ORDER — 0.9 % SODIUM CHLORIDE (POUR BTL) OPTIME
TOPICAL | Status: DC | PRN
  Administered 2023-10-29: 1000 mL

## 2023-10-29 MED ORDER — INSULIN ASPART 100 UNIT/ML IJ SOLN
0.0000 [IU] | INTRAMUSCULAR | Status: DC | PRN
  Administered 2023-10-29: 2 [IU] via SUBCUTANEOUS

## 2023-10-29 MED ORDER — INSULIN ASPART 100 UNIT/ML IJ SOLN: 0.0000 [IU] | INTRAMUSCULAR | Status: DC

## 2023-10-29 MED ORDER — FENTANYL CITRATE (PF) 100 MCG/2ML IJ SOLN: 25.0000 ug | INTRAMUSCULAR | Status: DC | PRN

## 2023-10-29 MED ORDER — INSULIN ASPART 100 UNIT/ML IJ SOLN
0.0000 [IU] | Freq: Every day | INTRAMUSCULAR | Status: DC
  Administered 2023-10-30: 2 [IU] via SUBCUTANEOUS

## 2023-10-29 MED ORDER — LIDOCAINE 2% (20 MG/ML) 5 ML SYRINGE
INTRAMUSCULAR | Status: DC | PRN
  Administered 2023-10-29: 40 mg via INTRAVENOUS

## 2023-10-29 MED ORDER — DEXTROSE 5 % IV SOLN: INTRAVENOUS | Status: AC

## 2023-10-29 MED ORDER — ORAL CARE MOUTH RINSE: 15.0000 mL | Freq: Once | OROMUCOSAL | Status: AC

## 2023-10-29 MED ORDER — OXYCODONE HCL 5 MG PO TABS: 5.0000 mg | ORAL_TABLET | Freq: Once | ORAL | Status: DC | PRN

## 2023-10-29 MED ORDER — PROPOFOL 500 MG/50ML IV EMUL
INTRAVENOUS | Status: DC | PRN
  Administered 2023-10-29: 150 ug/kg/min via INTRAVENOUS

## 2023-10-29 SURGICAL SUPPLY — 25 items
BLADE SURG 15 STRL LF DISP TIS (BLADE) IMPLANT
BNDG ELASTIC 4INX 5YD STR LF (GAUZE/BANDAGES/DRESSINGS) IMPLANT
BNDG GAUZE DERMACEA FLUFF 4 (GAUZE/BANDAGES/DRESSINGS) IMPLANT
CHLORAPREP W/TINT 26 (MISCELLANEOUS) IMPLANT
COVER SURGICAL LIGHT HANDLE (MISCELLANEOUS) ×1 IMPLANT
CUFF TOURN SGL QUICK 18X4 (TOURNIQUET CUFF) ×1 IMPLANT
DRSG ADAPTIC 3X8 NADH LF (GAUZE/BANDAGES/DRESSINGS) IMPLANT
ELECTRODE REM PT RTRN 9FT ADLT (ELECTROSURGICAL) IMPLANT
GAUZE PACKING IODOFORM 1/4X15 (PACKING) IMPLANT
GAUZE PAD ABD 8X10 STRL (GAUZE/BANDAGES/DRESSINGS) ×1 IMPLANT
GAUZE SPONGE 4X4 12PLY STRL (GAUZE/BANDAGES/DRESSINGS) ×1 IMPLANT
GAUZE XEROFORM 1X8 LF (GAUZE/BANDAGES/DRESSINGS) ×1 IMPLANT
GLOVE BIO SURGEON STRL SZ8 (GLOVE) ×1 IMPLANT
GLOVE BIOGEL PI IND STRL 8 (GLOVE) ×1 IMPLANT
GOWN STRL REUS W/ TWL LRG LVL3 (GOWN DISPOSABLE) ×2 IMPLANT
KIT BASIN OR (CUSTOM PROCEDURE TRAY) ×1 IMPLANT
KIT TURNOVER KIT B (KITS) ×1 IMPLANT
NDL PRECISIONGLIDE 27X1.5 (NEEDLE) ×1 IMPLANT
NEEDLE PRECISIONGLIDE 27X1.5 (NEEDLE) ×1 IMPLANT
NS IRRIG 1000ML POUR BTL (IV SOLUTION) ×1 IMPLANT
PACK ORTHO EXTREMITY (CUSTOM PROCEDURE TRAY) ×1 IMPLANT
PAD ARMBOARD POSITIONER FOAM (MISCELLANEOUS) ×2 IMPLANT
PAD CAST 4YDX4 CTTN HI CHSV (CAST SUPPLIES) ×1 IMPLANT
SYR CONTROL 10ML LL (SYRINGE) ×1 IMPLANT
TOWEL GREEN STERILE (TOWEL DISPOSABLE) ×1 IMPLANT

## 2023-10-29 NOTE — Plan of Care (Signed)

## 2023-10-29 NOTE — Interval H&P Note (Signed)
 History and Physical Interval Note:  10/29/2023 8:26 AM  Randy Jacobson  has presented today for surgery, with the diagnosis of osteomyelitis.  The various methods of treatment have been discussed with the patient and family. After consideration of risks, benefits and other options for treatment, the patient has consented to  Procedure(s) with comments: AMPUTATION, FOOT, RAY (Left) - PARTIAL FIFTH RAY AMPUTATION as a surgical intervention.  The patient's history has been reviewed, patient examined, no change in status, stable for surgery.  I have reviewed the patient's chart and labs.  Questions were answered to the patient's satisfaction.     Randy Jacobson

## 2023-10-29 NOTE — Plan of Care (Signed)

## 2023-10-29 NOTE — Brief Op Note (Signed)
 10/29/2023  2:01 PM  PATIENT:  Randy Jacobson  78 y.o. male  PRE-OPERATIVE DIAGNOSIS:  osteomyelitis  POST-OPERATIVE DIAGNOSIS:  same  PROCEDURE:  Procedure(s) with comments: AMPUTATION, FOOT, RAY (Left) - PARTIAL FIFTH RAY AMPUTATION  SURGEON:  Surgeons and Role:    DEWAINE Jacobson Randy CHRISTELLA, DPM - Primary  PHYSICIAN ASSISTANT: None  ASSISTANTS: none   ANESTHESIA:   local and IV sedation  EBL:  15 mL   BLOOD ADMINISTERED:none  DRAINS: none   LOCAL MEDICATIONS USED:  MARCAINE    , LIDOCAINE  , and Amount: 20 ml  SPECIMEN:  Source of Specimen:  5th metatarsal proximal sent for BOTH path and culture  DISPOSITION OF SPECIMEN:  PATHOLOGY  COUNTS:  YES  TOURNIQUET:   Total Tourniquet Time Documented: Calf (Right) - 31 minutes Total: Calf (Right) - 31 minutes   DICTATION: .Nechama Dictation  PLAN OF CARE: Admit to inpatient   PATIENT DISPOSITION:  PACU - hemodynamically stable.   Delay start of Pharmacological VTE agent (>24hrs) due to surgical blood loss or risk of bleeding: no  Randy Jacobson, DPM [facility]  Dr. Thresa EMERSON Jacobson, DPM    [address] [place], [place] [address]                Office [phone]  Fax [phone]

## 2023-10-29 NOTE — Progress Notes (Signed)
 PT Cancellation Note  Patient Details Name: Randy Jacobson MRN: [id number] DOB: [birth year]   Cancelled Treatment:    Reason Eval/Treat Not Completed: Patient at procedure or test/unavailable.  Pt in surgery. Will evaluate tomorrow as appropriate.   Thank you,   Katharina Venus HERO 10/29/2023, 9:48 AM

## 2023-10-29 NOTE — Interval H&P Note (Signed)
 History and Physical Interval Note:  10/29/2023 12:27 PM  Randy Jacobson  has presented today for surgery, with the diagnosis of osteomyelitis.  The various methods of treatment have been discussed with the patient and family. After consideration of risks, benefits and other options for treatment, the patient has consented to  Procedure(s) with comments: AMPUTATION, FOOT, RAY (Left) - PARTIAL FIFTH RAY AMPUTATION as a surgical intervention.  The patient's history has been reviewed, patient examined, no change in status, stable for surgery.  I have reviewed the patient's chart and labs.  Questions were answered to the patient's satisfaction.     Thresa CHRISTELLA Sar

## 2023-10-29 NOTE — Transfer of Care (Signed)
 Immediate Anesthesia Transfer of Care Note  Patient: Randy Jacobson  Procedure(s) Performed: AMPUTATION, FOOT, RAY (Right: Foot)  Patient Location: PACU  Anesthesia Type:MAC  Level of Consciousness: awake  Airway & Oxygen Therapy: Patient Spontanous Breathing  Post-op Assessment: Report given to RN and Post -op Vital signs reviewed and stable  Post vital signs: Reviewed and stable  Last Vitals:  Vitals Value Taken Time  BP 128/53 10/29/23 14:15  Temp 36.6 C 10/29/23 14:06  Pulse 86 10/29/23 14:16  Resp 21 10/29/23 14:16  SpO2 94 % 10/29/23 14:16  Vitals shown include unfiled device data.  Last Pain:  Vitals:   10/29/23 1406  TempSrc:   PainSc: 0-No pain         Complications: No notable events documented.

## 2023-10-29 NOTE — Progress Notes (Signed)
 OT Cancellation Note  Patient Details Name: Randy Jacobson MRN: [id number] DOB: Sep 26, 1945   Cancelled Treatment:    Reason Eval/Treat Not Completed: Patient at procedure or test/ unavailable (Pt off unit at PACU, OT will follow-up with pt as able)  10/29/2023  AB, OTR/L  Acute Rehabilitation Services  Office: [phone]   [name] 10/29/2023, 9:26 AM

## 2023-10-29 NOTE — Op Note (Signed)
 OPERATIVE REPORT Patient name: Randy Jacobson MRN: [id number] DOB: January 09, 1946  DOS: 10/29/23  Preop Dx: Osteomyelitis metatarsal right foot Postop Dx: same  Procedure:  1.  Partial fifth ray amputation right foot  Surgeon: Thresa EMERSON Sar DPM  Anesthesia: 50-50 mixture of 2% lidocaine  plain with 0.5% Marcaine  plain totaling 20 mL infiltrated in the patient's right lower extremity ankle block fashion  Hemostasis: Calf tourniquet inflated to a pressure of without esmarch exsanguination   EBL: 20 mL Materials: None Injectables: None Pathology: The most proximal remaining portion of the fifth metatarsal sent in two separate specimens for both culture and pathology.  Condition: The patient tolerated the procedure and anesthesia well. No complications noted or reported   Justification for procedure: The patient is a 78 y.o. male who presents today for surgical correction of osteomyelitis to the right fifth metatarsal. The patient was told benefits as well as possible side effects of the surgery. The patient consented for surgical correction. The patient consent form was reviewed. All patient questions were answered. No guarantees were expressed or implied. The patient and the surgeon both signed the patient consent form with the witness present and placed in the patient's chart.   Procedure in Detail: The patient was brought to the operating room, placed in the operating table in the supine position at which time an aseptic scrub and drape were performed about the patient's respective lower extremity after anesthesia was induced as described above. Attention was then directed to the surgical area where procedure number one commenced.  Procedure #1: Partial fifth ray amptutation right foot A racquet type incision was planned and made around the fifth ray of the right foot.  Incision carried directly down to the level of the fifth metatarsal and fifth MTP using a #15 scalpel.  The  toe was distracted distally using perforating towel clamps and disarticulated at the level of the MTP and removed in toto. Attention was then directed to the more proximal portion of the fifth metatarsal to visually healthier bone and soft tissue margins where an oblique osteotomy was created using a sagittal blade. The distal portion of the metatarsal was then removed in toto. Copious irrigation was then utilized and nonviable soft tissue was excisionally removed using a no15 scalpel. Another osteotomy was then created at the most distal remaining portion of the metatarsal and harvested as two separate specimens pathology for both culture and path.  Additional irrigation utilized in preparation for primary closure.  Primary closure achieved using stainless steel skin staples.  Quarter inch iodoform packing was placed at the more proximal portion of the incision site to prevent hematoma formation within the area.  Dry sterile compressive dressings were then applied to all previously mentioned incision sites about the patient's lower extremity. The tourniquet which was used for hemostasis was deflated.   The patient was then transferred from the operating room to the recovery room having tolerated the procedure and anesthesia well. All vital signs are stable. After a brief stay in the recovery room the patient was readmitted to inpatient room with postoperative orders placed.    IMPRESSION: Visibly the margins appear to be clean and viable.  Would anticipate okay for discharge on oral antibiotics  [name] M. Persephone Schriever, DPM [facility]  Dr. Thresa EMERSON Sar, DPM    [address]  [place], [place] [address]                Office [phone]  Fax [phone]

## 2023-10-29 NOTE — Progress Notes (Addendum)
 PROGRESS NOTE                                                                                                                                                                                                             Patient Demographics:    Randy Jacobson, is a 78 y.o. male, DOB - 09-14-1945, FMW:[id number]  Outpatient Primary MD for the patient is Medina-Vargas, Monina C, NP    LOS - 1  Admit date - 10/28/2023    No chief complaint on file.      Brief Narrative (HPI from H&P)    78 y.o. male with medical history significant of hypertension, diabetes mellitus type 2, and peripheral vascular disease presents due to worsening ulcer of his right fifth toe.   Approximately 6 - 8 weeks ago, he developed a blister on his right foot.  Despite regular wound management by a podiatrist, including bi-weekly visits and nightly wound wrapping, the wound had progressed.  He previously had no pain but over the last couple days had experienced severe pain in his foot rating it as a 9 out of 10 on the pain scale.  He has been on doxycycline for treatment.   Of note he had just undergone revascularization with Dr. Pearline on 7/7.  Patient had history of right leg critical limb ischemia, following procedure was noted to successful recanalization of the chronic total occlusion in the superficial femoral artery of the right leg, preserved one-vessel runoff via the peroneal.  Of note patient had also undergone right common femoral endarterectomy with profundoplasty and right external iliac stenting on 06/24/2023.   He was seen in the podiatry office yesterday where he was probed and x-rays were done with evidence of progressive osteomyelitis/bony destruction.  He was sent as a direct admission for antibiotics and surgery.   Subjective:    Norleen Kill today has, No headache, No chest pain, No abdominal pain - No Nausea, No new weakness tingling or  numbness, no shortness of breath   Assessment  & Plan :    Right foot partial fifth ray amputation due to ongoing osteomyelitis present on admission -patient has had this issue for 6 to 8 weeks, failed outpatient antibiotics, also underwent revascularization procedure by Dr. Pearline on 10/17/2023.  Currently on empiric IV antibiotics, undergoing surgical procedure on 10/29/2023, will follow cultures, minimal  surrounding cellulitis.  Will defer weightbearing, dressing changes and antibiotic duration to podiatry team.  Patient agreeable for transfusion if needed.  PAD.  Multiple revascularization procedures of the right lower extremity latest 10/17/2023 by Dr. Pearline, DAPT and statin postsurgery.  Dyslipidemia.  Statin.  Hypertension.  As needed hydralazine .  COPD.  Supportive care and monitor.    GERD.  PPI.  DM type II.  On long-acting insulin  and sliding scale, monitor and adjust, while n.p.o. gentle D5W.  Lab Results  Component Value Date   HGBA1C 5.9 (H) 10/28/2023    CBG (last 3)  Recent Labs    10/28/23 1748 10/28/23 2320 10/29/23 0902  GLUCAP 149* 242* 208*        Condition - Fair  Family Communication  : None present  Code Status : Full code  Consults  : Podiatry  PUD Prophylaxis :    Procedures  :           Disposition Plan  :    Status is: Inpatient   DVT Prophylaxis  :    enoxaparin  (LOVENOX ) injection 40 mg Start: 10/28/23 1000   Lab Results  Component Value Date   PLT 305 10/28/2023    Diet :  Diet Order             Diet NPO time specified Except for: Sips with Meds  Diet effective midnight                    Inpatient Medications  Scheduled Meds:  [MAR Hold] aspirin  EC  81 mg Oral Q0600   [MAR Hold] atorvastatin   40 mg Oral Daily   [START ON 10/30/2023] clopidogrel   75 mg Oral Daily   [MAR Hold] enoxaparin  (LOVENOX ) injection  40 mg Subcutaneous Q24H   gabapentin   100 mg Oral TID   [MAR Hold] insulin  aspart  0-9 Units  Subcutaneous Q4H   [MAR Hold] insulin  glargine-yfgn  20 Units Subcutaneous Q24H   pantoprazole   40 mg Oral Daily   [MAR Hold] sodium chloride  flush  3 mL Intravenous Q12H   Continuous Infusions:  [MAR Hold] ampicillin -sulbactam (UNASYN ) IV 3 g (10/29/23 0515)   dextrose  50 mL/hr at 10/29/23 0656   lactated ringers  10 mL/hr at 10/29/23 0927   [MAR Hold] linezolid  (ZYVOX ) IV 600 mg (10/28/23 2140)   PRN Meds:.[MAR Hold] acetaminophen  **OR** [MAR Hold] acetaminophen , [MAR Hold] albuterol , [MAR Hold] hydrALAZINE , insulin  aspart, [MAR Hold] melatonin, [MAR Hold] ondansetron  **OR** [MAR Hold] ondansetron  (ZOFRAN ) IV  Antibiotics  :    Anti-infectives (From admission, onward)    Start     Dose/Rate Route Frequency Ordered Stop   10/28/23 1100  [MAR Hold]  Ampicillin -Sulbactam (UNASYN ) 3 g in sodium chloride  0.9 % 100 mL IVPB        (MAR Hold since Sat 10/29/2023 at 0916.Hold Reason: Transfer to a Procedural area)   3 g 200 mL/hr over 30 Minutes Intravenous Every 6 hours 10/28/23 0918 11/04/23 1059   10/28/23 1000  [MAR Hold]  linezolid  (ZYVOX ) IVPB 600 mg        (MAR Hold since Sat 10/29/2023 at 0916.Hold Reason: Transfer to a Procedural area)  Placed in And Linked Group   600 mg 300 mL/hr over 60 Minutes Intravenous Every 12 hours 10/28/23 0911 11/04/23 0959         Objective:   Vitals:   10/29/23 0400 10/29/23 0800 10/29/23 0916 10/29/23 0919  BP: (!) 126/50 (!) 101/90 (!) 159/78   Pulse: 81  84 85   Resp: 18 13 17    Temp: 98.1 F (36.7 C) 97.7 F (36.5 C) 98 F (36.7 C)   TempSrc: Oral Oral    SpO2: 93% 94% 97%   Weight:    81.6 kg  Height:    5' 10.98 (1.803 m)    Wt Readings from Last 3 Encounters:  10/29/23 81.6 kg  10/27/23 81.6 kg  10/17/23 81.6 kg     Intake/Output Summary (Last 24 hours) at 10/29/2023 0950 Last data filed at 10/28/2023 2130 Gross per 24 hour  Intake 3 ml  Output --  Net 3 ml     Physical Exam  Awake Alert, No new F.N deficits, Normal  affect [place].AT,PERRAL Supple Neck, No JVD,   Symmetrical Chest wall movement, Good air movement bilaterally, CTAB RRR,No Gallops,Rubs or new Murmurs,  +ve B.Sounds, Abd Soft, No tenderness,   Right foot below          Data Review:    Recent Labs  Lab 10/28/23 0947  WBC 13.3*  HGB 10.3*  HCT 32.0*  PLT 305  MCV 92.5  MCH 29.8  MCHC 32.2  RDW 15.2  LYMPHSABS 1.1  MONOABS 1.4*  EOSABS 0.2  BASOSABS 0.1    Recent Labs  Lab 10/28/23 0947 10/29/23 0605  NA 135  --   K 4.2  --   CL 100  --   CO2 25  --   ANIONGAP 10  --   GLUCOSE 190*  --   BUN 12  --   CREATININE 0.80  --   AST 16  --   ALT 12  --   ALKPHOS 82  --   BILITOT 0.7  --   ALBUMIN  3.6  --   CRP 1.4* 1.1*  PROCALCITON  --  <0.10  HGBA1C 5.9*  --   CALCIUM  9.4  --       Recent Labs  Lab 10/28/23 0947 10/29/23 0605  CRP 1.4* 1.1*  PROCALCITON  --  <0.10  HGBA1C 5.9*  --   CALCIUM  9.4  --     --------------------------------------------------------------------------------------------------------------- Lab Results  Component Value Date   CHOL 70 06/24/2023   HDL 24 (L) 06/24/2023   LDLCALC 31 06/24/2023   TRIG 74 06/24/2023   CHOLHDL 2.9 06/24/2023    Lab Results  Component Value Date   HGBA1C 5.9 (H) 10/28/2023   No results for input(s): TSH, T4TOTAL, FREET4, T3FREE, THYROIDAB in the last 72 hours. No results for input(s): VITAMINB12, FOLATE, FERRITIN, TIBC, IRON , RETICCTPCT in the last 72 hours. ------------------------------------------------------------------------------------------------------------------ Cardiac Enzymes No results for input(s): CKMB, TROPONINI, MYOGLOBIN in the last 168 hours.  Invalid input(s): CK  Micro Results Recent Results (from the past 240 hours)  WOUND CULTURE     Status: None (Preliminary result)   Collection Time: 10/27/23 11:13 AM   Specimen: Wound  Result Value Ref Range Status   MICRO NUMBER: [id number]   Preliminary   SPECIMEN QUALITY: Adequate  Preliminary   SOURCE: WOUND (SITE NOT SPECIFIED)  Preliminary   STATUS: PRELIMINARY  Preliminary   GRAM STAIN:   Preliminary    No white blood cells seen No epithelial cells seen No organisms seen  Culture, blood (Routine X 2) w Reflex to ID Panel     Status: None (Preliminary result)   Collection Time: 10/28/23  9:47 AM   Specimen: BLOOD LEFT ARM  Result Value Ref Range Status   Specimen Description BLOOD LEFT ARM  Final   Special Requests  Final    BOTTLES DRAWN AEROBIC AND ANAEROBIC Blood Culture results may not be optimal due to an inadequate volume of blood received in culture bottles   Culture   Final    NO GROWTH < 24 HOURS Performed at [facility], [address] [place], [place] [address]    Report Status PENDING  Incomplete  Culture, blood (Routine X 2) w Reflex to ID Panel     Status: None (Preliminary result)   Collection Time: 10/28/23  9:49 AM   Specimen: BLOOD LEFT ARM  Result Value Ref Range Status   Specimen Description BLOOD LEFT ARM  Final   Special Requests   Final    BOTTLES DRAWN AEROBIC AND ANAEROBIC Blood Culture adequate volume   Culture   Final    NO GROWTH < 24 HOURS Performed at [facility], [address], [place], [place] [address]    Report Status PENDING  Incomplete  MRSA Next Gen by PCR, Nasal     Status: None   Collection Time: 10/29/23  5:22 AM   Specimen: Nasal Mucosa; Nasal Swab  Result Value Ref Range Status   MRSA by PCR Next Gen NOT DETECTED NOT DETECTED Final    Comment: (NOTE) The GeneXpert MRSA Assay (FDA approved for NASAL specimens only), is one component of a comprehensive MRSA colonization surveillance program. It is not intended to diagnose MRSA infection nor to guide or monitor treatment for MRSA infections. Test performance is not FDA approved in patients less than 40 years old. Performed at [facility], [address], [place], [place] [address]      Radiology Report DG Chest Port 1 View Result Date: 10/29/2023 CLINICAL DATA:  Shortness of breath. EXAM: PORTABLE CHEST 1 VIEW COMPARISON:  01/16/2023 FINDINGS: Stable cardiomediastinal contours. Aortic atherosclerosis. Lungs are clear. No pleural effusion, interstitial edema or consolidative change. Osseous structures appear intact. IMPRESSION: 1. No acute cardiopulmonary disease. 2. Aortic Atherosclerosis (ICD10-I70.0). Electronically Signed   By: Waddell Calk M.D.   On: 10/29/2023 07:39   DG Foot Complete Right Result Date: 10/27/2023 Please see detailed radiograph report in office note.    Signature  -   Lavada Stank M.D on 10/29/2023 at 9:50 AM   -  To page go to [email or web address]

## 2023-10-29 NOTE — Anesthesia Preprocedure Evaluation (Addendum)
 Anesthesia Evaluation  Patient identified by MRN, date of birth, ID band Patient awake    Reviewed: Allergy & Precautions, NPO status , Patient's Chart, lab work & pertinent test results  History of Anesthesia Complications Negative for: history of anesthetic complications  Airway Mallampati: III  TM Distance: >3 FB Neck ROM: Full    Dental  (+) Edentulous Upper, Edentulous Lower, Dental Advisory Given   Pulmonary neg shortness of breath, neg COPD, Recent URI , Residual Cough, former smoker   breath sounds clear to auscultation       Cardiovascular hypertension, Pt. on medications (-) angina + Peripheral Vascular Disease  (-) Past MI and (-) CHF  Rhythm:Regular   1. Left ventricular ejection fraction, by estimation, is 55 to 60%. The  left ventricle has normal function. Left ventricular endocardial border  not optimally defined to evaluate regional wall motion. There is mild left  ventricular hypertrophy. Left  ventricular diastolic parameters are consistent with Grade I diastolic  dysfunction (impaired relaxation).   2. Right ventricular systolic function is normal. The right ventricular  size is normal.   3. The mitral valve is abnormal. Trivial mitral valve regurgitation.   4. The aortic valve is tricuspid. There is mild calcification of the  aortic valve. There is mild thickening of the aortic valve. Aortic valve  regurgitation is not visualized. Aortic valve sclerosis is present, with  no evidence of aortic valve stenosis.     Neuro/Psych negative neurological ROS  negative psych ROS   GI/Hepatic Neg liver ROS,GERD  Medicated and Controlled,,  Endo/Other  diabetes    Renal/GU Lab Results      Component                Value               Date                      NA                       135                 10/28/2023                K                        4.2                 10/28/2023                CO2                       25                  10/28/2023                GLUCOSE                  190 (H)             10/28/2023                BUN                      12                  10/28/2023  CREATININE               0.80                10/28/2023                CALCIUM                   9.4                 10/28/2023                EGFR                     73                  05/18/2023                GFRNONAA                 >60                 10/28/2023                Musculoskeletal   Abdominal   Peds  Hematology  (+) Blood dyscrasia, anemia Lab Results      Component                Value               Date                      WBC                      13.3 (H)            10/28/2023                HGB                      10.3 (L)            10/28/2023                HCT                      32.0 (L)            10/28/2023                MCV                      92.5                10/28/2023                PLT                      305                 10/28/2023              Anesthesia Other Findings   Reproductive/Obstetrics                              Anesthesia Physical Anesthesia Plan  ASA: 3  Anesthesia Plan: MAC   Post-op Pain Management: Minimal or no pain anticipated   Induction: Intravenous  PONV Risk  Score and Plan: 1 and Ondansetron , Propofol  infusion and Treatment may vary due to age or medical condition  Airway Management Planned: Nasal Cannula, Natural Airway and Simple Face Mask  Additional Equipment: None  Intra-op Plan:   Post-operative Plan:   Informed Consent: I have reviewed the patients History and Physical, chart, labs and discussed the procedure including the risks, benefits and alternatives for the proposed anesthesia with the patient or authorized representative who has indicated his/her understanding and acceptance.     Dental advisory given  Plan Discussed with: CRNA  Anesthesia Plan Comments:           Anesthesia Quick Evaluation

## 2023-10-30 DIAGNOSIS — M86471 Chronic osteomyelitis with draining sinus, right ankle and foot: Secondary | ICD-10-CM | POA: Diagnosis not present

## 2023-10-30 LAB — CBC WITH DIFFERENTIAL/PLATELET
Abs Immature Granulocytes: 0.19 K/uL — ABNORMAL HIGH (ref 0.00–0.07)
Basophils Absolute: 0.1 K/uL (ref 0.0–0.1)
Basophils Relative: 1 %
Eosinophils Absolute: 0.3 K/uL (ref 0.0–0.5)
Eosinophils Relative: 2 %
HCT: 29.3 % — ABNORMAL LOW (ref 39.0–52.0)
Hemoglobin: 9.5 g/dL — ABNORMAL LOW (ref 13.0–17.0)
Immature Granulocytes: 1 %
Lymphocytes Relative: 8 %
Lymphs Abs: 1.1 K/uL (ref 0.7–4.0)
MCH: 30.1 pg (ref 26.0–34.0)
MCHC: 32.4 g/dL (ref 30.0–36.0)
MCV: 92.7 fL (ref 80.0–100.0)
Monocytes Absolute: 1.7 K/uL — ABNORMAL HIGH (ref 0.1–1.0)
Monocytes Relative: 12 %
Neutro Abs: 10.4 K/uL — ABNORMAL HIGH (ref 1.7–7.7)
Neutrophils Relative %: 76 %
Platelets: 289 K/uL (ref 150–400)
RBC: 3.16 MIL/uL — ABNORMAL LOW (ref 4.22–5.81)
RDW: 15 % (ref 11.5–15.5)
WBC: 13.8 K/uL — ABNORMAL HIGH (ref 4.0–10.5)
nRBC: 0 % (ref 0.0–0.2)

## 2023-10-30 LAB — BASIC METABOLIC PANEL WITH GFR
Anion gap: 11 (ref 5–15)
BUN: 12 mg/dL (ref 8–23)
CO2: 26 mmol/L (ref 22–32)
Calcium: 8.9 mg/dL (ref 8.9–10.3)
Chloride: 100 mmol/L (ref 98–111)
Creatinine, Ser: 0.79 mg/dL (ref 0.61–1.24)
GFR, Estimated: >60 mL/min (ref >60)
Glucose, Bld: 160 mg/dL — ABNORMAL HIGH (ref 70–99)
Potassium: 4.1 mmol/L (ref 3.5–5.1)
Sodium: 137 mmol/L (ref 135–145)

## 2023-10-30 LAB — PROCALCITONIN: Procalcitonin: <0.1 ng/mL

## 2023-10-30 LAB — GLUCOSE, CAPILLARY
Glucose-Capillary: 145 mg/dL — ABNORMAL HIGH (ref 70–99)
Glucose-Capillary: 258 mg/dL — ABNORMAL HIGH (ref 70–99)
Glucose-Capillary: 170 mg/dL — ABNORMAL HIGH (ref 70–99)
Glucose-Capillary: 235 mg/dL — ABNORMAL HIGH (ref 70–99)

## 2023-10-30 LAB — WOUND CULTURE
MICRO NUMBER:: 16712606
SPECIMEN QUALITY:: ADEQUATE

## 2023-10-30 LAB — C-REACTIVE PROTEIN: CRP: 1.1 mg/dL — ABNORMAL HIGH (ref <1.0)

## 2023-10-30 LAB — MAGNESIUM: Magnesium: 1.7 mg/dL (ref 1.7–2.4)

## 2023-10-30 LAB — BRAIN NATRIURETIC PEPTIDE: B Natriuretic Peptide: 48.7 pg/mL (ref 0.0–100.0)

## 2023-10-30 MED ORDER — AMLODIPINE BESYLATE 10 MG PO TABS
10.0000 mg | ORAL_TABLET | Freq: Every day | ORAL | Status: DC
  Administered 2023-10-30: 10 mg via ORAL
  Filled 2023-10-30: qty 1

## 2023-10-30 MED ORDER — CIPROFLOXACIN HCL 500 MG PO TABS
500.0000 mg | ORAL_TABLET | Freq: Two times a day (BID) | ORAL | Status: DC
  Administered 2023-10-30 – 2023-10-31 (×3): 500 mg via ORAL
  Filled 2023-10-30 (×3): qty 1

## 2023-10-30 NOTE — Progress Notes (Signed)
   Subjective: 1 Day Post-Op Procedure(s) (LRB): AMPUTATION, FOOT, RAY (Right) DOS: 10/29/2023  Patient doing well.  Resting comfortably.   Objective: Vital signs in last 24 hours: Temp:  [97.7 F (36.5 C)-98.4 F (36.9 C)] 98.4 F (36.9 C) (07/20 0800) Pulse Rate:  [77-90] 83 (07/20 0800) Resp:  [11-22] 18 (07/20 0800) BP: (121-185)/(49-138) 154/53 (07/20 0800) SpO2:  [92 %-97 %] 97 % (07/20 0800)  Recent Labs    10/28/23 0947 10/30/23 0703  HGB 10.3* 9.5*   Recent Labs    10/28/23 0947 10/30/23 0703  WBC 13.3* 13.8*  RBC 3.46* 3.16*  HCT 32.0* 29.3*  PLT 305 289   Recent Labs    10/28/23 0947 10/30/23 0703  NA 135 137  K 4.2 4.1  CL 100 100  CO2 25 26  BUN 12 12  CREATININE 0.80 0.79  GLUCOSE 190* 160*  CALCIUM  9.4 8.9   No results for input(s): LABPT, INR in the last 72 hours.   POV#1 10/30/2023.  Physical Exam: Incision well coapted staples intact.  Packing removed from the most proximal portion of the incision site which did have some minimal sanguinous bleeding expected since the patient is on anticoagulant, Plavix  75 mg daily.  Significant reduction of erythema and edema around the area.  Clinically the foot appears to be healing nicely  Aerobic/Anaerobic Culture w Gram Stain (surgical/deep wound) Order: [id number]  Status: Preliminary result     Next appt: 11/03/2023 at 01:00 PM in Oncology ([facility]LEB[facility])   Test Result Released: No   Specimen Information: Bone; Tissue  0 Result Notes    Component Ref Range & Units (hover) 1 d ago  Specimen Description BONE  Special Requests NONE  Gram Stain RARE WBC SEEN NO ORGANISMS SEEN Performed at [facility], [address] [place], [place] [address]  Culture PENDING  Report Status PENDING      Assessment/Plan: 1 Day Post-Op Procedure(s) (LRB): AMPUTATION, FOOT, RAY (Right) DOS: 10/29/2023  -Seen at bedside this a.m. -Dressings changed.  Leave clean dry and intact  until follow-up in the office -WBAT surgical shoe with the assistance of a walker.  Minimal ambulation only -Bone culture pending.  Specimen of bone located to the most distal portion of the remaining fifth metatarsal to ensure clean margin resection -WBC trending downward. -Anticipate patient okay for discharge tomorrow on oral antibiotics.  Prior to admission patient was on doxycycline and prefers not to have doxycycline.  Continue Cipro  500 mg BID x 7 days. Will adjust antibiotics if needed pending culture results outpatient after discharge  -Anticipated follow-up in office 1 week from tomorrow, 11/08/2023, Monday.  I will have our office schedulers reach out to him to schedule an appointment -Podiatry will sign off.    Thresa CHRISTELLA Sar 10/30/2023, 10:15 AM  Thresa EMERSON Sar, DPM [facility]  Dr. Thresa EMERSON Sar, DPM    [address] [place], [place] [address]                Office [phone]  Fax [phone]

## 2023-10-30 NOTE — Evaluation (Signed)
 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  PMH: left iliofemoral endarterectomy with pericardial patch angioplasty, left femoral to below knee popliteal bypass with non reversed translocated vein for gangrenous left great toe on 10/15/2022, DM, HTN, peripheral artery disease  Clinical Impression  PTA, pt lives alone, but has assist from significant other. Pt reporting decreased pain control; states IV pain medicine only lasts 20 minutes. Notified RN. Pt compliant with elevation. Reviewed weightbearing precautions and post op shoe use. Pt able to don post op shoe sitting edge of bed with figure 4 technique with increased time. Pt ambulating bedroom distance with RW. Demonstrates dynamic instability, particularly with turns and transition to sitting. Pt denies history of recent falls. He is currently declining follow up therapy services. Will continue to follow acutely while inpatient.        If plan is discharge home, recommend the following: A little help with walking and/or transfers;A little help with bathing/dressing/bathroom;Assistance with cooking/housework;Assist for transportation;Help with stairs or ramp for entrance   Can travel by private vehicle        Equipment Recommendations None recommended by PT  Recommendations for Other Services       Functional Status Assessment Patient has had a recent decline in their functional status and demonstrates the ability to make significant improvements in function in a reasonable and predictable amount of time.     Precautions / Restrictions Precautions Precautions: Fall Required Braces or Orthoses: Other Brace Other Brace: R post op shoe Restrictions Weight Bearing Restrictions Per Provider  Order: Yes RLE Weight Bearing Per Provider Order: Partial weight bearing RLE Partial Weight Bearing Percentage or Pounds: 50%      Mobility  Bed Mobility Overal bed mobility: Modified Independent                  Transfers Overall transfer level: Needs assistance Equipment used: Rolling walker (2 wheels) Transfers: Sit to/from Stand Sit to Stand: Supervision           General transfer comment: Unsteadiness with backing up to bed with decreased eccentric control. Visual demonstration of sticking R foot anteriorly prior to transition to prevent excessive weightbearing    Ambulation/Gait Ambulation/Gait assistance: Supervision Gait Distance (Feet): 25 Feet Assistive device: Rolling walker (2 wheels) Gait Pattern/deviations: Step-to pattern Gait velocity: decreased Gait velocity interpretation: <1.8 ft/sec, indicate of risk for recurrent falls   General Gait Details: Verbal cues for sequencing/technique, WB precautions, use of walker. Supervision for safety. Pt with dynamic instability  Stairs            Wheelchair Mobility     Tilt Bed    Modified Rankin (Stroke Patients Only)       Balance Overall balance assessment: Needs assistance Sitting-balance support: Feet supported Sitting balance-Leahy Scale: Good     Standing balance support: Bilateral upper extremity supported Standing balance-Leahy Scale: Poor                               Pertinent Vitals/Pain Pain Assessment Pain Assessment: Faces Faces Pain Scale: Hurts little more Pain Location: surgical site Pain Descriptors / Indicators: Operative site guarding, Discomfort Pain Intervention(s): Limited activity within patient's tolerance, Other (comment) (RN notified)  Home Living Family/patient expects to be discharged to:: Private residence Living Arrangements: Alone Available Help at Discharge: Friend(s);Available PRN/intermittently Type of Home: House Home Access:  Stairs to enter Entrance Stairs-Rails: Doctor, general practice of Steps: 4   Home Layout: One level Home Equipment: Rolling Walker (2 wheels);Grab bars - toilet Additional Comments: Pt significant other has dementia. Pt stays at her place on the weekends and eats dinner there every night    Prior Function Prior Level of Function : Independent/Modified Independent                     Extremity/Trunk Assessment   Upper Extremity Assessment Upper Extremity Assessment: Defer to OT evaluation    Lower Extremity Assessment Lower Extremity Assessment: RLE deficits/detail RLE Deficits / Details: s/p 5th ray amputation. Hip/knee WFL    Cervical / Trunk Assessment Cervical / Trunk Assessment: Normal  Communication   Communication Communication: No apparent difficulties    Cognition Arousal: Alert Behavior During Therapy: WFL for tasks assessed/performed   PT - Cognitive impairments: No apparent impairments                         Following commands: Intact       Cueing Cueing Techniques: Verbal cues     General Comments      Exercises     Assessment/Plan    PT Assessment Patient needs continued PT services  PT Problem List Decreased strength;Decreased activity tolerance;Decreased balance;Decreased mobility;Pain       PT Treatment Interventions DME instruction;Gait training;Functional mobility training;Stair training;Therapeutic exercise;Therapeutic activities;Balance training;Patient/family education    PT Goals (Current goals can be found in the Care Plan section)  Acute Rehab PT Goals Patient Stated Goal: to limit walking PT Goal Formulation: With patient Time For Goal Achievement: 11/13/23 Potential to Achieve Goals: Good    Frequency Min 1X/week     Co-evaluation               AM-PAC PT 6 Clicks Mobility  Outcome Measure Help needed turning from your back to your side while in a flat bed without using bedrails?:  None Help needed moving from lying on your back to sitting on the side of a flat bed without using bedrails?: None Help needed moving to and from a bed to a chair (including a wheelchair)?: A Little Help needed standing up from a chair using your arms (e.g., wheelchair or bedside chair)?: A Little Help needed to walk in hospital room?: A Little Help needed climbing 3-5 steps with a railing? : A Little 6 Click Score: 20    End of Session Equipment Utilized During Treatment: Gait belt Activity Tolerance: Patient tolerated treatment well Patient left: in bed;with call bell/phone within reach;with bed alarm set Nurse Communication: Mobility status PT Visit Diagnosis: Unsteadiness on feet (R26.81);Pain Pain - Right/Left: Right Pain - part of body: Ankle and joints of foot    Time: 8893-8873 PT Time Calculation (min) (ACUTE ONLY): 20 min   Charges:   PT Evaluation $PT Eval Low Complexity: 1 Low   PT General Charges $$ ACUTE PT VISIT: 1 Visit         Aleck Daring, PT, DPT Acute Rehabilitation Services Office [phone]   Alayne ONEIDA Daring 10/30/2023, 1:12 PM

## 2023-10-30 NOTE — Progress Notes (Signed)
 PROGRESS NOTE                                                                                                                                                                                                             Patient Demographics:    Rhodes Calvert, is a 78 y.o. male, DOB - Sep 03, 1945, FMW:[id number]  Outpatient Primary MD for the patient is Medina-Vargas, Monina C, NP    LOS - 2  Admit date - 10/28/2023    No chief complaint on file.      Brief Narrative (HPI from H&P)    78 y.o. male with medical history significant of hypertension, diabetes mellitus type 2, and peripheral vascular disease presents due to worsening ulcer of his right fifth toe.   Approximately 6 - 8 weeks ago, he developed a blister on his right foot.  Despite regular wound management by a podiatrist, including bi-weekly visits and nightly wound wrapping, the wound had progressed.  He previously had no pain but over the last couple days had experienced severe pain in his foot rating it as a 9 out of 10 on the pain scale.  He has been on doxycycline for treatment.   Of note he had just undergone revascularization with Dr. Pearline on 7/7.  Patient had history of right leg critical limb ischemia, following procedure was noted to successful recanalization of the chronic total occlusion in the superficial femoral artery of the right leg, preserved one-vessel runoff via the peroneal.  Of note patient had also undergone right common femoral endarterectomy with profundoplasty and right external iliac stenting on 06/24/2023.   He was seen in the podiatry office yesterday where he was probed and x-rays were done with evidence of progressive osteomyelitis/bony destruction.  He was sent as a direct admission for antibiotics and surgery.   Subjective:   Patient in bed, appears comfortable, denies any headache, no fever, no chest pain or pressure, no shortness of breath  , no abdominal pain. No focal weakness.   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 Pearline on 10/17/2023.  Was initially kept on empiric IV antibiotics seen by podiatrist underwent Partial fifth ray  amputation right foot on 10/29/2023 by Dr. Janit, cultures thus far growing Morganella, switch to Cipro , 50% weightbearing on right leg, PT OT likely discharge home tomorrow if stable.  PAD.  Multiple revascularization procedures of the right lower extremity latest 10/17/2023 by Dr. Pearline, DAPT and statin postsurgery.  Dyslipidemia.  Statin.  Hypertension.  As needed hydralazine .  COPD.  Supportive care and monitor.    GERD.  PPI.  DM type II.  On long-acting insulin  and sliding scale, monitor and adjust, while n.p.o. gentle D5W.  Lab Results  Component Value Date   HGBA1C 5.9 (H) 10/28/2023    CBG (last 3)  Recent Labs    10/29/23 1528 10/29/23 2024 10/30/23 0817  GLUCAP 128* 197* 145*        Condition - Fair  Family Communication  : None present  Code Status : Full code  Consults  : Podiatry  PUD Prophylaxis :    Procedures  :           Disposition Plan  :    Status is: Inpatient   DVT Prophylaxis  :    enoxaparin  (LOVENOX ) injection 40 mg Start: 10/28/23 1000   Lab Results  Component Value Date   PLT 289 10/30/2023    Diet :  Diet Order             Diet Carb Modified Fluid consistency: Thin; Room service appropriate? Yes  Diet effective now                    Inpatient Medications  Scheduled Meds:  amLODipine   10 mg Oral Daily   aspirin  EC  81 mg Oral Q0600   atorvastatin   40 mg Oral Daily   ciprofloxacin   500 mg Oral BID   clopidogrel   75 mg Oral Daily   enoxaparin  (LOVENOX ) injection  40 mg Subcutaneous Q24H   gabapentin   100 mg Oral  TID   insulin  aspart  0-5 Units Subcutaneous QHS   insulin  aspart  0-6 Units Subcutaneous TID WC   insulin  glargine-yfgn  20 Units Subcutaneous Q24H   pantoprazole   40 mg Oral Daily   sodium chloride  flush  3 mL Intravenous Q12H   Continuous Infusions:   PRN Meds:.acetaminophen  **OR** acetaminophen , albuterol , hydrALAZINE , HYDROmorphone  (DILAUDID ) injection, melatonin, ondansetron  **OR** ondansetron  (ZOFRAN ) IV  Antibiotics  :    Anti-infectives (From admission, onward)    Start     Dose/Rate Route Frequency Ordered Stop   10/30/23 1000  ciprofloxacin  (CIPRO ) tablet 500 mg        500 mg Oral 2 times daily 10/30/23 0913     10/28/23 1100  Ampicillin -Sulbactam (UNASYN ) 3 g in sodium chloride  0.9 % 100 mL IVPB  Status:  Discontinued        3 g 200 mL/hr over 30 Minutes Intravenous Every 6 hours 10/28/23 0918 10/30/23 0913   10/28/23 1000  linezolid  (ZYVOX ) IVPB 600 mg  Status:  Discontinued       Placed in And Linked Group   600 mg 300 mL/hr over 60 Minutes Intravenous Every 12 hours 10/28/23 0911 10/30/23 0913         Objective:   Vitals:   10/30/23 0000 10/30/23 0400 10/30/23 0610 10/30/23 0800  BP: (!) 162/51 (!) 168/57 (!) 145/66 (!) 154/53  Pulse: 90 77 85 83  Resp: 17 18 15 18   Temp: 97.9 F (36.6 C) 98 F (36.7 C)  98.4 F (36.9 C)  TempSrc: Oral Oral  Oral  SpO2: 94% 97% 95% 97%  Weight:      Height:        Wt Readings from Last 3 Encounters:  10/29/23 81.6 kg  10/27/23 81.6 kg  10/17/23 81.6 kg     Intake/Output Summary (Last 24 hours) at 10/30/2023 0914 Last data filed at 10/30/2023 0819 Gross per 24 hour  Intake 293 ml  Output 1015 ml  Net -722 ml     Physical Exam  Awake Alert, No new F.N deficits, Normal affect [place].AT,PERRAL Supple Neck, No JVD,   Symmetrical Chest wall movement, Good air movement bilaterally, CTAB RRR,No Gallops,Rubs or new Murmurs,  +ve B.Sounds, Abd Soft, No tenderness,   Right foot below at the time of admission,  currently postop under bandage          Data Review:    Recent Labs  Lab 10/28/23 0947 10/30/23 0703  WBC 13.3* 13.8*  HGB 10.3* 9.5*  HCT 32.0* 29.3*  PLT 305 289  MCV 92.5 92.7  MCH 29.8 30.1  MCHC 32.2 32.4  RDW 15.2 15.0  LYMPHSABS 1.1 1.1  MONOABS 1.4* 1.7*  EOSABS 0.2 0.3  BASOSABS 0.1 0.1    Recent Labs  Lab 10/28/23 0947 10/29/23 0605 10/30/23 0703  NA 135  --  137  K 4.2  --  4.1  CL 100  --  100  CO2 25  --  26  ANIONGAP 10  --  11  GLUCOSE 190*  --  160*  BUN 12  --  12  CREATININE 0.80  --  0.79  AST 16  --   --   ALT 12  --   --   ALKPHOS 82  --   --   BILITOT 0.7  --   --   ALBUMIN  3.6  --   --   CRP 1.4* 1.1* 1.1*  PROCALCITON  --  <0.10  --   HGBA1C 5.9*  --   --   BNP  --   --  48.7  MG  --   --  1.7  CALCIUM  9.4  --  8.9      Recent Labs  Lab 10/28/23 0947 10/29/23 0605 10/30/23 0703  CRP 1.4* 1.1* 1.1*  PROCALCITON  --  <0.10  --   HGBA1C 5.9*  --   --   BNP  --   --  48.7  MG  --   --  1.7  CALCIUM  9.4  --  8.9    --------------------------------------------------------------------------------------------------------------- Lab Results  Component Value Date   CHOL 70 06/24/2023   HDL 24 (L) 06/24/2023   LDLCALC 31 06/24/2023   TRIG 74 06/24/2023   CHOLHDL 2.9 06/24/2023    Lab Results  Component Value Date   HGBA1C 5.9 (H) 10/28/2023      Micro Results Recent Results (from the past 240 hours)  WOUND CULTURE     Status: Abnormal   Collection Time: 10/27/23 11:13 AM   Specimen: Wound  Result Value Ref Range Status   MICRO NUMBER: [id number]  Final   SPECIMEN QUALITY: Adequate  Final   SOURCE: WOUND (SITE NOT SPECIFIED)  Final   STATUS: FINAL  Final   GRAM STAIN:   Final    No white blood cells seen No epithelial cells seen No organisms seen   ISOLATE 1: Morganella morganii (A)  Final    Comment: Moderate growth of Morganella morganii      Susceptibility   Morganella morganii - AEROBIC CULT, GRAM STAIN  NEGATIVE 1    AMOX/CLAVULANIC >=32 Resistant     AMPICILLIN /SULBACTAM >=32 Resistant     CEFAZOLIN * >=64 Resistant      * For infections other than uncomplicated UTI caused by E. coli, K. pneumoniae or P. mirabilis: Cefazolin  is resistant if MIC > or = 8 mcg/mL. (Distinguishing susceptible versus intermediate for isolates with MIC < or = 4 mcg/mL requires additional testing.)     CEFTAZIDIME <=1 Sensitive     CIPROFLOXACIN  <=0.06 Sensitive     LEVOFLOXACIN <=0.12 Sensitive     GENTAMICIN <=1 Sensitive     MEROPENEM 0.5 Sensitive     PIP/TAZO <=4 Sensitive     TRIMETH/SULFA* <=20 Sensitive      * For infections other than uncomplicated UTI caused by E. coli, K. pneumoniae or P. mirabilis: Cefazolin  is resistant if MIC > or = 8 mcg/mL. (Distinguishing susceptible versus intermediate for isolates with MIC < or = 4 mcg/mL requires additional testing.) Legend: S = Susceptible  I = Intermediate R = Resistant  NS = Not susceptible SDD = Susceptible Dose Dependent * = Not Tested  NR = Not Reported **NN = See Therapy Comments   Culture, blood (Routine X 2) w Reflex to ID Panel     Status: None (Preliminary result)   Collection Time: 10/28/23  9:47 AM   Specimen: BLOOD LEFT ARM  Result Value Ref Range Status   Specimen Description BLOOD LEFT ARM  Final   Special Requests   Final    BOTTLES DRAWN AEROBIC AND ANAEROBIC Blood Culture results may not be optimal due to an inadequate volume of blood received in culture bottles   Culture   Final    NO GROWTH 2 DAYS Performed at [facility], [address], [place], [place] [address]    Report Status PENDING  Incomplete  Culture, blood (Routine X 2) w Reflex to ID Panel     Status: None (Preliminary result)   Collection Time: 10/28/23  9:49 AM   Specimen: BLOOD LEFT ARM  Result Value Ref Range Status   Specimen Description BLOOD LEFT ARM  Final   Special Requests   Final    BOTTLES DRAWN AEROBIC AND ANAEROBIC Blood Culture  adequate volume   Culture   Final    NO GROWTH 2 DAYS Performed at [facility], [address], [place], [place] [address]    Report Status PENDING  Incomplete  MRSA Next Gen by PCR, Nasal     Status: None   Collection Time: 10/29/23  5:22 AM   Specimen: Nasal Mucosa; Nasal Swab  Result Value Ref Range Status   MRSA by PCR Next Gen NOT DETECTED NOT DETECTED Final    Comment: (NOTE) The GeneXpert MRSA Assay (FDA approved for NASAL specimens only), is one component of a comprehensive MRSA colonization surveillance program. It is not intended to diagnose MRSA infection nor to guide or monitor treatment for MRSA infections. Test performance is not FDA approved in patients less than 35 years old. Performed at [facility], [address], [place], [place] [address]   Aerobic/Anaerobic Culture w Gram Stain (surgical/deep wound)     Status: None (Preliminary result)   Collection Time: 10/29/23  1:35 PM   Specimen: Bone; Tissue  Result Value Ref Range Status   Specimen Description BONE  Final   Special Requests NONE  Final   Gram Stain   Final    RARE WBC SEEN NO ORGANISMS SEEN Performed at  [facility], [address] [place], [place] [address]    Culture PENDING  Incomplete   Report Status PENDING  Incomplete    Radiology Report DG Foot Complete Right Result Date: 10/29/2023 CLINICAL DATA:  Foot osteomyelitis. EXAM: RIGHT FOOT COMPLETE - 3+ VIEW COMPARISON:  Radiograph 10/27/2023 FINDINGS: Transmetatarsal amputation of the fifth ray involving the proximal metatarsal. Resection margin is smooth. Expected postsurgical change in the overlying soft tissues. Overlying skin staples in place. The remainder the exam is unchanged, no findings of osteomyelitis in the remaining foot. IMPRESSION: Transmetatarsal amputation of the fifth ray. Electronically Signed   By: Andrea Gasman M.D.   On: 10/29/2023 18:21   DG Chest Port 1 View Result Date: 10/29/2023 CLINICAL DATA:   Shortness of breath. EXAM: PORTABLE CHEST 1 VIEW COMPARISON:  01/16/2023 FINDINGS: Stable cardiomediastinal contours. Aortic atherosclerosis. Lungs are clear. No pleural effusion, interstitial edema or consolidative change. Osseous structures appear intact. IMPRESSION: 1. No acute cardiopulmonary disease. 2. Aortic Atherosclerosis (ICD10-I70.0). Electronically Signed   By: Waddell Calk M.D.   On: 10/29/2023 07:39     Signature  -   Lavada Stank M.D on 10/30/2023 at 9:14 AM   -  To page go to [email or web address]

## 2023-10-30 NOTE — Progress Notes (Signed)
 Orthopedic Tech Progress Note Patient Details:  Randy Jacobson 26-Dec-1945 [id number]  Ortho Devices Type of Ortho Device: Postop shoe/boot Ortho Device/Splint Location: For the RLE, at bedside Ortho Device/Splint Interventions: Ordered   Post Interventions Instructions Provided: Care of device, Adjustment of device  [name] 10/30/2023, 11:12 AM

## 2023-10-30 NOTE — Plan of Care (Signed)
  Problem: Education: Goal: Knowledge of General Education information will improve Description: Including pain rating scale, medication(s)/side effects and non-pharmacologic comfort measures Outcome: Progressing   Problem: Health Behavior/Discharge Planning: Goal: Ability to manage health-related needs will improve Outcome: Progressing   Problem: Clinical Measurements: Goal: Ability to maintain clinical measurements within normal limits will improve Outcome: Progressing   Problem: Activity: Goal: Risk for activity intolerance will decrease Outcome: Progressing   Problem: Coping: Goal: Level of anxiety will decrease Outcome: Progressing   Problem: Pain Managment: Goal: General experience of comfort will improve and/or be controlled Outcome: Progressing   Problem: Skin Integrity: Goal: Risk for impaired skin integrity will decrease Outcome: Progressing

## 2023-10-31 ENCOUNTER — Encounter (HOSPITAL_COMMUNITY): Payer: Self-pay | Admitting: Podiatry

## 2023-10-31 ENCOUNTER — Other Ambulatory Visit (HOSPITAL_COMMUNITY): Payer: Self-pay

## 2023-10-31 ENCOUNTER — Encounter: Payer: Self-pay | Admitting: Oncology

## 2023-10-31 DIAGNOSIS — M86471 Chronic osteomyelitis with draining sinus, right ankle and foot: Secondary | ICD-10-CM | POA: Diagnosis not present

## 2023-10-31 LAB — TYPE AND SCREEN
ABO/RH(D): O NEG
Antibody Screen: POSITIVE
Donor AG Type: NEGATIVE
Donor AG Type: NEGATIVE
Unit division: 0
Unit division: 0

## 2023-10-31 LAB — BPAM RBC
Blood Product Expiration Date: 202507272359
Blood Product Expiration Date: 202508012359
Unit Type and Rh: 9500
Unit Type and Rh: 9500

## 2023-10-31 LAB — GLUCOSE, CAPILLARY: Glucose-Capillary: 196 mg/dL — ABNORMAL HIGH (ref 70–99)

## 2023-10-31 MED ORDER — LOSARTAN POTASSIUM 50 MG PO TABS
50.0000 mg | ORAL_TABLET | Freq: Every day | ORAL | Status: DC
  Administered 2023-10-31: 50 mg via ORAL
  Filled 2023-10-31: qty 1

## 2023-10-31 MED ORDER — HYDROCODONE-ACETAMINOPHEN 5-325 MG PO TABS
1.0000 | ORAL_TABLET | Freq: Two times a day (BID) | ORAL | 0 refills | Status: DC | PRN
  Filled 2023-10-31: qty 15, 7d supply, fill #0

## 2023-10-31 MED ORDER — ACETAMINOPHEN 500 MG PO TABS
500.0000 mg | ORAL_TABLET | Freq: Three times a day (TID) | ORAL | 0 refills | Status: AC | PRN
Start: 2023-10-31 — End: ?
  Filled 2023-10-31: qty 20, 7d supply, fill #0

## 2023-10-31 MED ORDER — CIPROFLOXACIN HCL 500 MG PO TABS
500.0000 mg | ORAL_TABLET | Freq: Two times a day (BID) | ORAL | 0 refills | Status: DC
  Filled 2023-10-31: qty 12, 6d supply, fill #0

## 2023-10-31 MED ORDER — AMLODIPINE BESYLATE 10 MG PO TABS
10.0000 mg | ORAL_TABLET | Freq: Every day | ORAL | 0 refills | Status: DC
  Filled 2023-10-31: qty 30, 30d supply, fill #0

## 2023-10-31 MED ORDER — AMLODIPINE BESYLATE 10 MG PO TABS
10.0000 mg | ORAL_TABLET | Freq: Every day | ORAL | Status: DC
  Administered 2023-10-31: 10 mg via ORAL
  Filled 2023-10-31: qty 1

## 2023-10-31 NOTE — Plan of Care (Signed)

## 2023-10-31 NOTE — Evaluation (Addendum)
 Occupational Therapy Evaluation and Discharge Patient Details Name: Randy Jacobson MRN: [id number] DOB: [birth year] Today's Date: 10/31/2023   History of Present Illness   Pt is a 78 yo male presenting to [facility] on [facility]23 with worsening ulcer of his R fifth toe, s/p partial fifth ray amputation R foot on 7/19.  PMH: left iliofemoral endarterectomy with pericardial patch angioplasty, left femoral to below knee popliteal bypass with non reversed translocated vein for gangrenous left great toe on 10/15/2022, DM, HTN, peripheral artery disease     Clinical Impressions Pt is typically independent. Presents with post operative R foot pain. He is currently functioning modified independently in mobility with RW and in ADLs. He plans to discharge to his girlfriend's house which is barrier free. No OT needs. Educated pt on MD's order for minimal ambulation and recommended elevating R foot at rest.      If plan is discharge home, recommend the following:   Assistance with cooking/housework;Assist for transportation     Functional Status Assessment   Patient has had a recent decline in their functional status and demonstrates the ability to make significant improvements in function in a reasonable and predictable amount of time.     Equipment Recommendations   None recommended by OT     Recommendations for Other Services         Precautions/Restrictions   Precautions Precautions: Fall Recall of Precautions/Restrictions: Intact Required Braces or Orthoses: Other Brace Other Brace: R post op shoe Restrictions Weight Bearing Restrictions Per Provider Order: Yes RLE Weight Bearing Per Provider Order: Weight bearing as tolerated Other Position/Activity Restrictions: newest WB order (under PT orders) is as tolerated in post op shoe     Mobility Bed Mobility Overal bed mobility: Modified Independent                  Transfers Overall transfer level: Modified  independent Equipment used: Rolling walker (2 wheels)                      Balance Overall balance assessment: Needs assistance   Sitting balance-Leahy Scale: Good     Standing balance support: Bilateral upper extremity supported Standing balance-Leahy Scale: Fair Standing balance comment: can release walker in static standing                           ADL either performed or assessed with clinical judgement   ADL Overall ADL's : Modified independent                                             Vision Baseline Vision/History: 1 Wears glasses Ability to See in Adequate Light: 0 Adequate Patient Visual Report: No change from baseline       Perception         Praxis         Pertinent Vitals/Pain Pain Assessment Pain Assessment: Faces Faces Pain Scale: Hurts little more Pain Location: surgical site Pain Descriptors / Indicators: Operative site guarding, Discomfort Pain Intervention(s): Monitored during session, Repositioned     Extremity/Trunk Assessment Upper Extremity Assessment Upper Extremity Assessment: Overall WFL for tasks assessed   Lower Extremity Assessment Lower Extremity Assessment: Defer to PT evaluation   Cervical / Trunk Assessment Cervical / Trunk Assessment: Normal   Communication Communication Communication: Impaired Factors Affecting Communication: Hearing impaired  Cognition Arousal: Alert Behavior During Therapy: WFL for tasks assessed/performed Cognition: No apparent impairments                               Following commands: Intact       Cueing  General Comments   Cueing Techniques: Verbal cues      Exercises     Shoulder Instructions      Home Living Family/patient expects to be discharged to:: Private residence Living Arrangements: Non-relatives/Friends (going to girlfriend's home) Available Help at Discharge: Friend(s);Available 24 hours/day   Home Access: Level  entry     Home Layout: One level     Bathroom Shower/Tub: Producer, television/film/video: Standard     Home Equipment: Agricultural consultant (2 wheels);Grab bars - toilet   Additional Comments: plans to stay at her girlfriend's home with no stairs      Prior Functioning/Environment Prior Level of Function : Independent/Modified Independent;Driving                    OT Problem List:     OT Treatment/Interventions:        OT Goals(Current goals can be found in the care plan section)       OT Frequency:       Co-evaluation              AM-PAC OT 6 Clicks Daily Activity     Outcome Measure Help from another person eating meals?: None Help from another person taking care of personal grooming?: None Help from another person toileting, which includes using toliet, bedpan, or urinal?: None Help from another person bathing (including washing, rinsing, drying)?: None Help from another person to put on and taking off regular upper body clothing?: None Help from another person to put on and taking off regular lower body clothing?: None 6 Click Score: 24   End of Session Equipment Utilized During Treatment: Rolling walker (2 wheels);Gait belt Nurse Communication: Other (comment) (wants to get his meds in our pharmacy)  Activity Tolerance: Patient tolerated treatment well Patient left: in bed;with call bell/phone within reach  OT Visit Diagnosis: Unsteadiness on feet (R26.81)                Time: 9055-8994 OT Time Calculation (min): 21 min Charges:  OT General Charges $OT Visit: 1 Visit OT Evaluation $OT Eval Low Complexity: 1 Low  Mliss HERO, OTR/L Acute Rehabilitation Services Office: [phone]   Kennth Mliss Helling 10/31/2023, 10:10 AM

## 2023-10-31 NOTE — Discharge Instructions (Signed)
 Follow with Primary MD Medina-Vargas, Monina C, NP in 7 days, follow-up with your foot surgeon within a week of discharge.  First dressing change in his office.  Get CBC, CMP, Magnesium , 2 view Chest X ray -  checked next visit with your primary MD   Activity: 50%, partial weightbearing in the right foot with the surgical boot on at all times when you are out of the bed with Full fall precautions use walker/cane & assistance as needed  Disposition Home   Diet: Heart Healthy low carbohydrate, check CBGs q. [facility]  Special Instructions: If you have smoked or chewed Tobacco  in the last 2 yrs please stop smoking, stop any regular Alcohol  and or any Recreational drug use.  On your next visit with your primary care physician please Get Medicines reviewed and adjusted.  Please request your Prim.MD to go over all Hospital Tests and Procedure/Radiological results at the follow up, please get all Hospital records sent to your Prim MD by signing hospital release before you go home.  If you experience worsening of your admission symptoms, develop shortness of breath, life threatening emergency, suicidal or homicidal thoughts you must seek medical attention immediately by calling 911 or calling your MD immediately  if symptoms less severe.  You Must read complete instructions/literature along with all the possible adverse reactions/side effects for all the Medicines you take and that have been prescribed to you. Take any new Medicines after you have completely understood and accpet all the possible adverse reactions/side effects.   Do not drive when taking Pain medications.  Do not take more than prescribed Pain, Sleep and Anxiety Medications  Wear Seat belts while driving.

## 2023-10-31 NOTE — Discharge Summary (Signed)
 Randy Jacobson FMW:[id number] DOB: 02-Sep-1945 DOA: 10/28/2023  PCP: Phyllis Jereld BROCKS, NP  Admit date: 10/28/2023  Discharge date: 10/31/2023  Admitted From: Home   Disposition:  Home   Recommendations for Outpatient Follow-up:   Follow up with PCP in 1-2 weeks  PCP Please obtain BMP/CBC, 2 view CXR in 1week,  (see Discharge instructions)   PCP Please follow up on the following pending results:     Home Health: None   Equipment/Devices: as below  Consultations: Podiatry Discharge Condition: Stable    CODE STATUS: Full    Diet Recommendation: Heart Healthy Low Carb    CC foot pain, Right   Brief history of present illness from the day of admission and additional interim summary    78 y.o. male with medical history significant of hypertension, diabetes mellitus type 2, and peripheral vascular disease presents due to worsening ulcer of his right fifth toe.   Approximately 6 - 8 weeks ago, he developed a blister on his right foot.  Despite regular wound management by a podiatrist, including bi-weekly visits and nightly wound wrapping, the wound had progressed.  He previously had no pain but over the last couple days had experienced severe pain in his foot rating it as a 9 out of 10 on the pain scale.  He has been on doxycycline for treatment.   Of note he had just undergone revascularization with Dr. Pearline on 7/7.  Patient had history of right leg critical limb ischemia, following procedure was noted to successful recanalization of the chronic total occlusion in the superficial femoral artery of the right leg, preserved one-vessel runoff via the peroneal.  Of note patient had also undergone right common femoral endarterectomy with profundoplasty and right external iliac stenting on 06/24/2023.   He was seen in  the podiatry office yesterday where he was probed and x-rays were done with evidence of progressive osteomyelitis/bony destruction.  He was sent as a direct admission for antibiotics and surgery.                                                                 Hospital Course   Right foot partial fifth ray amputation due to ongoing osteomyelitis present on admission -patient has had this issue for 6 to 8 weeks, failed outpatient antibiotics, also underwent revascularization procedure by Dr. Pearline on 10/17/2023.  Was initially kept on empiric IV antibiotics seen by podiatrist underwent Partial fifth ray amputation right foot on 10/29/2023 by Dr. Janit, cultures thus far growing Morganella, switch to Cipro , 50% weightbearing on right leg, then cleared by PT OT stable and symptom-free eager to go home will be discharged on 6 more days of oral Cipro , walker, outpatient follow-up with podiatry within a week.  Case discussed with Dr. Janit.    PAD.  Multiple revascularization procedures of the right lower extremity latest 10/17/2023 by Dr. Pearline, DAPT and statin postsurgery.   Dyslipidemia.  Statin.   Hypertension.  Continue home regimen added Norvasc  for better control PCP to monitor and adjust   COPD.  Supportive care and monitor.     GERD.  PPI.   DM type II.  Continue home regimen.  Discharge diagnosis     Principal Problem:   Osteomyelitis of right foot (HCC) Active Problems:   Diabetic foot ulcer (HCC)   Leukocytosis   Diabetes mellitus, type 2 (HCC)   PAD (peripheral artery disease) (HCC)   Hyperlipidemia   Hypertension   Normocytic anemia    Discharge instructions    Discharge Instructions     Discharge instructions   Complete by: As directed    Follow with Primary MD Medina-Vargas, Monina C, NP in 7 days, follow-up with your foot surgeon within a week of discharge.  First dressing change in his office.  Get CBC, CMP, Magnesium , 2 view Chest X ray -  checked next visit with  your primary MD   Activity: 50%, partial weightbearing in the right foot with the surgical boot on at all times when you are out of the bed with Full fall precautions use walker/cane & assistance as needed  Disposition Home   Diet: Heart Healthy low carbohydrate, check CBGs q. [facility]  Special Instructions: If you have smoked or chewed Tobacco  in the last 2 yrs please stop smoking, stop any regular Alcohol  and or any Recreational drug use.  On your next visit with your primary care physician please Get Medicines reviewed and adjusted.  Please request your Prim.MD to go over all Hospital Tests and Procedure/Radiological results at the follow up, please get all Hospital records sent to your Prim MD by signing hospital release before you go home.  If you experience worsening of your admission symptoms, develop shortness of breath, life threatening emergency, suicidal or homicidal thoughts you must seek medical attention immediately by calling 911 or calling your MD immediately  if symptoms less severe.  You Must read complete instructions/literature along with all the possible adverse reactions/side effects for all the Medicines you take and that have been prescribed to you. Take any new Medicines after you have completely understood and accpet all the possible adverse reactions/side effects.   Do not drive when taking Pain medications.  Do not take more than prescribed Pain, Sleep and Anxiety Medications  Wear Seat belts while driving.   Discharge wound care:   Complete by: As directed    Keep the right foot dressing clean and dry at all times, first dressing change by your foot surgeon within a week, follow-up in his office.   Increase activity slowly   Complete by: As directed        Discharge Medications   Allergies as of 10/31/2023   No Known Allergies      Medication List     STOP taking these medications    doxycycline 100 MG capsule Commonly known as: VIBRAMYCIN        TAKE these medications    acetaminophen  500 MG tablet Commonly known as: TYLENOL  Take 1 tablet (500 mg total) by mouth every 8 (eight) hours as needed for mild pain (pain score 1-3) or fever.   amLODipine  10 MG tablet Commonly known as: NORVASC  Take 1 tablet (10 mg total) by mouth daily. Start taking on: November 01, 2023   aspirin  EC 81 MG tablet  Take 1 tablet (81 mg total) by mouth daily at 6 (six) AM. Swallow whole.   atorvastatin  40 MG tablet Commonly known as: LIPITOR Take 1 tablet (40 mg total) by mouth daily.   ciprofloxacin  500 MG tablet Commonly known as: CIPRO  Take 1 tablet (500 mg total) by mouth 2 (two) times daily.   clopidogrel  75 MG tablet Commonly known as: PLAVIX  Take 1 tablet (75 mg total) by mouth daily.   ferrous sulfate  325 (65 FE) MG tablet Take 1 tablet (325 mg total) by mouth daily with breakfast.   FreeStyle Libre 2 Sensor Misc 2 Devices by Does not apply route daily. E11.69 What changed:  how much to take how to take this when to take this   gabapentin  100 MG capsule Commonly known as: NEURONTIN  Take 1 capsule (100 mg total) by mouth 3 (three) times daily.   hydrochlorothiazide  25 MG tablet Commonly known as: HYDRODIURIL  Take 0.5 tablets (12.5 mg total) by mouth daily.   HYDROcodone -acetaminophen  5-325 MG tablet Commonly known as: NORCO/VICODIN Take 1 tablet by mouth every 12 (twelve) hours as needed for severe pain (pain score 7-10).   losartan  50 MG tablet Commonly known as: COZAAR  Take 1 tablet (50 mg total) by mouth in the morning and at bedtime.   Melatonin 10 MG Tabs Take 10 mg by mouth at bedtime.   metFORMIN  1000 MG tablet Commonly known as: GLUCOPHAGE  Take 1 tablet (1,000 mg total) by mouth 2 (two) times daily with a meal.   pantoprazole  40 MG tablet Commonly known as: PROTONIX  Take 1 tablet (40 mg total) by mouth daily.   silver sulfADIAZINE 1 % cream Commonly known as: SILVADENE Apply 1 Application topically  daily.   sitaGLIPtin  50 MG tablet Commonly known as: Januvia  Take 1 tablet (50 mg total) by mouth daily as needed (for high blood sugar).   Tresiba  FlexTouch 100 UNIT/ML FlexTouch Pen Generic drug: insulin  degludec Inject 38 Units into the skin daily.   vitamin C  1000 MG tablet Take 1 tablet (1,000 mg total) by mouth daily.               Durable Medical Equipment  (From admission, onward)           Start     Ordered   10/31/23 0837  For home use only DME Walker rolling  Once       Comments: 5 wheel  Question Answer Comment  Walker: With 5 Inch Wheels   Patient needs a walker to treat with the following condition Weakness      10/31/23 0836   10/30/23 1032  For home use only DME Walker  Once       Question Answer Comment  Patient needs a walker to treat with the following condition Osteomyelitis, acute, ankle or foot, right (HCC)   Patient needs a walker to treat with the following condition Postop check      10/30/23 1032              Discharge Care Instructions  (From admission, onward)           Start     Ordered   10/31/23 0000  Discharge wound care:       Comments: Keep the right foot dressing clean and dry at all times, first dressing change by your foot surgeon within a week, follow-up in his office.   10/31/23 0836             Follow-up Information  Medina-Vargas, Monina C, NP. Schedule an appointment as soon as possible for a visit in 1 week(s).   Specialty: Internal Medicine Contact information: [address] [place] [address] [phone]         Janit Thresa HERO, DPM. Schedule an appointment as soon as possible for a visit in 1 week(s).   Specialty: Podiatry Contact information: [address] [place] [address] [place] [address] [phone]                 Major procedures and Radiology Reports - PLEASE review detailed and final reports thoroughly  -       MR FOOT RIGHT WO CONTRAST Result Date:  10/31/2023 CLINICAL DATA:  Right foot osteomyelitis. EXAM: MRI OF THE RIGHT FOREFOOT WITHOUT CONTRAST TECHNIQUE: Multiplanar, multisequence MR imaging of the right forefoot was performed. No intravenous contrast was administered. COMPARISON:  Radiographs 10/27/2023 and 08/22/2023. FINDINGS: Technical note: Despite efforts by the technologist and patient, mild motion artifact is present on today's exam and could not be eliminated. This reduces exam sensitivity and specificity. Bones/Joint/Cartilage As shown on the recent outside radiographs, there is cortical destruction of the 5th metatarsal head with associated diffusely decreased T1 and increased T2 marrow signal throughout the shaft of the 5th metatarsal. There is also marrow T2 hyperintensity and T1 hypointensity within the base of the 5th proximal phalanx. The other metatarsals and phalanges appear intact. There are mild to moderate degenerative changes at the 1st metatarsophalangeal joint. No significant joint effusions. The alignment is normal at the Lisfranc joint. Ligaments Intact Lisfranc ligament. The collateral ligaments of the metatarsophalangeal joints appear intact. Muscles and Tendons Mild T2 hyperintensity within the forefoot flexor musculature with trace flexor digitorum tenosynovitis. No evidence of tendon tear. Soft tissues Soft tissue ulceration lateral to the 5th metatarsal head with underlying ill-defined soft tissue inflammatory changes. No organized fluid collection or foreign body identified. IMPRESSION: 1. Soft tissue ulceration lateral to the 5th metatarsal head with underlying osteomyelitis of the 5th metatarsal and base of the 5th proximal phalanx. 2. No evidence of abscess. 3. Mild to moderate degenerative changes at the 1st metatarsophalangeal joint. Electronically Signed   By: Elsie Perone M.D.   On: 10/31/2023 08:16   DG Foot Complete Right Result Date: 10/29/2023 CLINICAL DATA:  Foot osteomyelitis. EXAM: RIGHT FOOT COMPLETE -  3+ VIEW COMPARISON:  Radiograph 10/27/2023 FINDINGS: Transmetatarsal amputation of the fifth ray involving the proximal metatarsal. Resection margin is smooth. Expected postsurgical change in the overlying soft tissues. Overlying skin staples in place. The remainder the exam is unchanged, no findings of osteomyelitis in the remaining foot. IMPRESSION: Transmetatarsal amputation of the fifth ray. Electronically Signed   By: Andrea Gasman M.D.   On: 10/29/2023 18:21   DG Chest Port 1 View Result Date: 10/29/2023 CLINICAL DATA:  Shortness of breath. EXAM: PORTABLE CHEST 1 VIEW COMPARISON:  01/16/2023 FINDINGS: Stable cardiomediastinal contours. Aortic atherosclerosis. Lungs are clear. No pleural effusion, interstitial edema or consolidative change. Osseous structures appear intact. IMPRESSION: 1. No acute cardiopulmonary disease. 2. Aortic Atherosclerosis (ICD10-I70.0). Electronically Signed   By: Waddell Calk M.D.   On: 10/29/2023 07:39   DG Foot Complete Right Result Date: 10/27/2023 Please see detailed radiograph report in office note.  PERIPHERAL VASCULAR CATHETERIZATION Result Date: 10/17/2023 Images from the original result were not included. Patient name: Randy Jacobson MRN: [id number] DOB: [birth year] Sex: male 10/17/2023 Pre-operative Diagnosis: CLTI of RLE with 5th toe wound Post-operative diagnosis:  Same Surgeon:  Norman RAMAN  Pearline, MD Procedure Performed: Ultrasound-guided stenosis of the left common femoral artery Aortogram and right lower extremity angiogram Third order cannulation of right popliteal artery Intravascular lithotripsy of right SFA and popliteal arteries with 5 mm shockwave E8 Stenting of right SFA and popliteal arteries with 7 mm Eluvia stents Drug-coated balloon angioplasty of proximal right SFA with 6 mm Ranger Mynx closure of left common femoral artery 89 minutes of moderate sedation with fentanyl  and Versed  Indications: Mr. Shadwick is a 78 year old male with PAD who has had  multiple interventions.  The left leg has a femoral-popliteal bypass.  The right recently underwent a common femoral endarterectomy.  He presents clinic for follow-up he was noted to have a right fifth toe wound and ABIs demonstrating severe arterial insufficiency with a toe pressure of 0.  Risks and benefits of angiogram with intervention were reviewed, he expressed understanding elected to proceed. Findings: Widely patent aorta and bilateral renal arteries. Widely patent common iliac stents.  Widely patent external iliac stent on the right. The right common femoral endarterectomy site is widely patent.  The profunda is patent although there is an approximate 50% stenosis.  The SFA is patent for the first 8 to 10 cm's but has severe with a proximal stenosis of about 70%.  The SFA then occludes and the above-knee popliteal artery reconstitutes via collaterals.  The popliteal artery appears widely patent.  The AT occludes shortly after the turn.  The PT is chronically occluded.  The peroneal is the only runoff to the foot which reconstitutes plantar branches in the DP.  Procedure:  The patient was identified in the holding area and taken to the cath lab  The patient was then placed supine on the table and prepped and draped in the usual sterile fashion.  A time out was called.  Ultrasound was used to evaluate the left common femoral artery.  It was patent .  A digital ultrasound image was acquired.  A micropuncture needle was used to access the left common femoral artery under ultrasound guidance.  An 018 wire was advanced without resistance and a micropuncture sheath was placed.  The 018 wire was removed and a benson wire was placed.  The micropuncture sheath was exchanged for a 5 french sheath.  An omniflush catheter was advanced over the wire to the level of L-1.  An abdominal angiogram was obtained.  Next, using the omniflush catheter and a glide advantage wire, the aortic bifurcation was crossed and the catheter  was placed into theright external iliac artery and right runoff was obtained. This demonstrated the above findings.  Glide advantage wire was then replaced through the catheter and into the proximal SFA.  The patient was systemically heparinized and the short 5 French sheath was exchanged for a 6 [place] by 45 cm catapult sheath.  The SFA CTO was severely calcific and I first attempted to cross with the glide advantage which was unsuccessful as well as an 018 system which was unsuccessful.  After attempts with different wiring catheter combinations, I was able to cross the SFA occlusion subintimally with a stiff angled Glidewire and a quick cross catheter.  An angiogram via the catheter demonstrated true lumen reentry.  Given the severe calcific disease associated with the occlusion the 035 Glidewire was exchanged for an 014 [place] core wire and a quick cross catheter times lesion was treated with a 5 mm shockwave E8 balloon.  All 400 pulses and 10 treatments were deployed throughout the above-knee popliteal and SFA.  An angiogram demonstrated flow lumen although there is still severe ratty disease.  Therefore elected to stent this lesion.  The 014 wire was exchanged for the glide advantage wire to switch back to an 035 system and the lesion was treated with two 7 mm x 150 mm Eluvia stents.  These were deployed from the above-knee popliteal into the proximal third of the SFA and postdilated with a 6 mm Mustang balloon.  Given the patency of the proximal SFA and disease in the send I wanted to try to hold off stenting of this area therefore I treated with balloon angioplasty with a 6 mm Mustang balloon and an angiogram demonstrated an adequate result and therefore a drug-coated balloon angioplasty was performed of this area with a 6 mm x 80 mm Ranger.  Completion angiogram demonstrated wide patency of the treated segments with less than 30% residual stenosis in most areas.  There was a short area in the mid SFA that  was stented but still had an approximate 50% residual stenosis although this was treated with a high-pressure inflation with a 6 mm balloon and did not give much.  Completion angiogram demonstrated preserved runoff via the peroneal.  The long 6 French sheath was exchanged for a short 6 French sheath and a minx closure device was deployed with excellent hemostasis in the left common femoral artery. Contrast: 80 cc Sedation: 89 minutes Impression: Successful recanalization of the right SFA CTO, preserved one-vessel runoff via the peroneal. Norman GORMAN Serve MD [facility] Office: [phone]  VAS US  LOWER EXTREMITY BYPASS GRAFT DUPLEX Result Date: 10/07/2023 LOWER EXTREMITY ARTERIAL DUPLEX STUDY Patient Name:  Randy Jacobson  Date of Exam:   10/07/2023 Medical Rec #: [id number]      Accession #:    [id number] Date of Birth: January 05, 1946      Patient Gender: M Patient Age:   20 years Exam Location:  [address] Procedure:      VAS US  LOWER EXTREMITY BYPASS GRAFT DUPLEX Referring Phys: NORMAN SERVE --------------------------------------------------------------------------------  Indications: Claudication. High Risk Factors: Hypertension, hyperlipidemia, Diabetes, past history of                    smoking.  Vascular Interventions: 06/23/23: Right external iliac artery stenting with 8 x                         60 PTX, postdilated with 7 x 60 Mustang. Right CFA                         endarterectomy and profundoplasty.                         06/20/23: Drug-coated balloon angioplasty of left                         femoral-popliteal bypass with 6 x 40 Impact, distal                         anastomosis.                         Widely patent common iliac stents and left external                         iliac  artery stent. CTO of right SFA.                         10/15/22: Left iliofemoral endarterectomy.                         Bovine pericardial patch angioplasty. Left femoral to                          below-knee popliteal artery bypass with nonreversed                         translocated saphenous vein graft.                         10/11/22: Angioplasty and stenting of the left common                         iliac artery and external iliac artery. Current ABI:            Rt .57, Lt non-compressible Comparison Study: 06/03/23 >70% stenosis distal graft and anastomosis on the                   left. Performing Technologist: [employer] BS, RVT, RDCS  Examination Guidelines: A complete evaluation includes B-mode imaging, spectral Doppler, color Doppler, and power Doppler as needed of all accessible portions of each vessel. Bilateral testing is considered an integral part of a complete examination. Limited examinations for reoccurring indications may be performed as noted.   +--------+--------+-----+--------+---------+--------+ LEFT    PSV cm/sRatioStenosisWaveform Comments +--------+--------+-----+--------+---------+--------+ CFA [id number]                  triphasic         +--------+--------+-----+--------+---------+--------+ TP [id number]                   triphasic         +--------+--------+-----+--------+---------+--------+  Left Graft #1: CFA to BK Pop +--------------------+--------+--------+---------+--------+                     PSV cm/sStenosisWaveform Comments +--------------------+--------+--------+---------+--------+ Inflow              105             triphasic         +--------------------+--------+--------+---------+--------+ Proximal Anastomosis110             biphasic          +--------------------+--------+--------+---------+--------+ Proximal Graft      104             biphasic          +--------------------+--------+--------+---------+--------+ Mid Graft           106             biphasic          +--------------------+--------+--------+---------+--------+ Distal Graft        96              biphasic           +--------------------+--------+--------+---------+--------+ Distal Anastomosis  144             biphasic          +--------------------+--------+--------+---------+--------+ Outflow             87  triphasic         +--------------------+--------+--------+---------+--------+   Summary: Left: Patent left fem-pop bypass graft without stenosis.  See table(s) above for measurements and observations. Electronically signed by Norman Serve on 10/07/2023 at 11:53:11 AM.    Final    VAS US  ABI WITH/WO TBI Result Date: 10/07/2023  LOWER EXTREMITY DOPPLER STUDY Patient Name:  Randy Jacobson  Date of Exam:   10/07/2023 Medical Rec #: [id number]      Accession #:    [id number] Date of Birth: [birth year]      Patient Gender: M Patient Age:   81 years Exam Location:  [address] Procedure:      VAS US  ABI WITH/WO TBI Referring Phys: NORMAN SERVE --------------------------------------------------------------------------------  Indications: Claudication, and peripheral artery disease. Patient c/o bilateral              foot pain which prevents him from walking on the treadmill. High Risk         Hypertension, hyperlipidemia, Diabetes, past history of Factors:          smoking.  Vascular Interventions: 06/23/23: Right external iliac artery stenting with 8 x                         60 PTX, postdilated with 7 x 60 Mustang. Right CFA                         endarterectomy and profundoplasty.                         06/20/23: Drug-coated balloon angioplasty of left                         femoral-popliteal bypass with 6 x 40 Impact, distal                         anastomosis.                         Widely patent common iliac stents and left external                         iliac artery stent. CTO of right SFA.                         10/15/22: Left iliofemoral endarterectomy.                         Bovine pericardial patch angioplasty. Left femoral to                         below-knee popliteal artery bypass  with nonreversed                         translocated saphenous vein graft.                         10/11/22: Angioplasty and stenting of the left common                         iliac artery and external iliac artery. Comparison Study: 06/03/23 showed >70% stenosis in the distal graft and  distal                   anastomosis on the left. Performing Technologist: [employer] BS, RVT, RDCS  Examination Guidelines: A complete evaluation includes at minimum, Doppler waveform signals and systolic blood pressure reading at the level of bilateral brachial, anterior tibial, and posterior tibial arteries, when vessel segments are accessible. Bilateral testing is considered an integral part of a complete examination. Photoelectric Plethysmograph (PPG) waveforms and toe systolic pressure readings are included as required and additional duplex testing as needed. Limited examinations for reoccurring indications may be performed as noted.  ABI Findings: +---------+------------------+-----+----------+--------+ Right    Rt Pressure (mmHg)IndexWaveform  Comment  +---------+------------------+-----+----------+--------+ Brachial 164                                       +---------+------------------+-----+----------+--------+ ATA      79                0.48 monophasic         +---------+------------------+-----+----------+--------+ PTA      94                0.57 monophasic         +---------+------------------+-----+----------+--------+ Great Toe                       Absent             +---------+------------------+-----+----------+--------+ +---------+------------------+-----+--------+-------+ Left     Lt Pressure (mmHg)IndexWaveformComment +---------+------------------+-----+--------+-------+ Brachial 165                                    +---------+------------------+-----+--------+-------+ ATA      233               1.41 biphasic         +---------+------------------+-----+--------+-------+ PTA      100               0.61 biphasic        +---------+------------------+-----+--------+-------+ Great Toe                       Absent          +---------+------------------+-----+--------+-------+ +-------+-----------+-----------+------------+------------+ ABI/TBIToday's ABIToday's TBIPrevious ABIPrevious TBI +-------+-----------+-----------+------------+------------+ Right  .57                   [place]                       +-------+-----------+-----------+------------+------------+ Left   1.41                  .88                      +-------+-----------+-----------+------------+------------+  Arterial wall calcification precludes accurate ankle pressures and ABIs.  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 Left: Resting left ankle-brachial index indicates noncompressible left lower extremity arteries. *See table(s) above for measurements and observations.  Electronically signed by Norman Serve on 10/07/2023 at 11:53:04 AM.    Final     Micro Results    Recent Results (from the past 240 hours)  WOUND CULTURE     Status: Abnormal   Collection Time: 10/27/23 11:13 AM   Specimen: Wound  Result Value Ref Range Status   MICRO NUMBER: [id number]  Final   SPECIMEN QUALITY: Adequate  Final   SOURCE: WOUND (SITE NOT SPECIFIED)  Final   STATUS: FINAL  Final   GRAM STAIN:   Final    No white blood cells seen No epithelial cells seen No organisms seen   ISOLATE 1: Morganella morganii (A)  Final    Comment: Moderate growth of Morganella morganii      Susceptibility   Morganella morganii - AEROBIC CULT, GRAM STAIN NEGATIVE 1    AMOX/CLAVULANIC >=32 Resistant     AMPICILLIN /SULBACTAM >=32 Resistant     CEFAZOLIN * >=64 Resistant      * For infections other than uncomplicated UTI caused by E. coli, K. pneumoniae or P. mirabilis: Cefazolin  is resistant if  MIC > or = 8 mcg/mL. (Distinguishing susceptible versus intermediate for isolates with MIC < or = 4 mcg/mL requires additional testing.)     CEFTAZIDIME <=1 Sensitive     CIPROFLOXACIN  <=0.06 Sensitive     LEVOFLOXACIN <=0.12 Sensitive     GENTAMICIN <=1 Sensitive     MEROPENEM 0.5 Sensitive     PIP/TAZO <=4 Sensitive     TRIMETH/SULFA* <=20 Sensitive      * For infections other than uncomplicated UTI caused by E. coli, K. pneumoniae or P. mirabilis: Cefazolin  is resistant if MIC > or = 8 mcg/mL. (Distinguishing susceptible versus intermediate for isolates with MIC < or = 4 mcg/mL requires additional testing.) Legend: S = Susceptible  I = Intermediate R = Resistant  NS = Not susceptible SDD = Susceptible Dose Dependent * = Not Tested  NR = Not Reported **NN = See Therapy Comments   Culture, blood (Routine X 2) w Reflex to ID Panel     Status: None (Preliminary result)   Collection Time: 10/28/23  9:47 AM   Specimen: BLOOD LEFT ARM  Result Value Ref Range Status   Specimen Description BLOOD LEFT ARM  Final   Special Requests   Final    BOTTLES DRAWN AEROBIC AND ANAEROBIC Blood Culture results may not be optimal due to an inadequate volume of blood received in culture bottles   Culture   Final    NO GROWTH 2 DAYS Performed at [facility], [address] [place], [place] [address]    Report Status PENDING  Incomplete  Culture, blood (Routine X 2) w Reflex to ID Panel     Status: None (Preliminary result)   Collection Time: 10/28/23  9:49 AM   Specimen: BLOOD LEFT ARM  Result Value Ref Range Status   Specimen Description BLOOD LEFT ARM  Final   Special Requests   Final    BOTTLES DRAWN AEROBIC AND ANAEROBIC Blood Culture adequate volume   Culture   Final    NO GROWTH 2 DAYS Performed at [facility], [address] [place], [place] [address]    Report Status PENDING  Incomplete  MRSA Next Gen by PCR, Nasal     Status: None   Collection Time: 10/29/23  5:22  AM   Specimen: Nasal Mucosa; Nasal Swab  Result Value Ref Range Status   MRSA by PCR Next Gen NOT DETECTED NOT DETECTED Final    Comment: (NOTE) The GeneXpert MRSA Assay (FDA approved for NASAL specimens only), is one component of a comprehensive MRSA colonization surveillance program. It is not intended to diagnose MRSA infection nor to guide or monitor treatment for MRSA infections. Test performance is not  FDA approved in patients less than 41 years old. Performed at [facility], [address], [place], [place] [address]   Aerobic/Anaerobic Culture w Gram Stain (surgical/deep wound)     Status: None (Preliminary result)   Collection Time: 10/29/23  1:35 PM   Specimen: Bone; Tissue  Result Value Ref Range Status   Specimen Description BONE  Final   Special Requests NONE  Final   Gram Stain RARE WBC SEEN NO ORGANISMS SEEN   Final   Culture   Final    NO GROWTH < 24 HOURS Performed at [facility], [address] [place], [place] [address]    Report Status PENDING  Incomplete    Today   Subjective    Randy Jacobson today has no headache,no chest abdominal pain,no new weakness tingling or numbness, feels much better wants to go home today.    Objective   Blood pressure (!) 158/66, pulse 93, temperature (!) 97.4 F (36.3 C), temperature source Oral, resp. rate 18, height 5' 10.98 (1.803 m), weight 81.6 kg, SpO2 90%.   Intake/Output Summary (Last 24 hours) at 10/31/2023 0836 Last data filed at 10/31/2023 0556 Gross per 24 hour  Intake --  Output 950 ml  Net -950 ml    Exam  Awake Alert, No new F.N deficits,    [place].AT,PERRAL Supple Neck,   Symmetrical Chest wall movement, Good air movement bilaterally, CTAB RRR,No Gallops,   +ve B.Sounds, Abd Soft, Non tender,  Right foot under bandage   Data Review   Recent Labs  Lab 10/28/23 0947 10/30/23 0703  WBC 13.3* 13.8*  HGB 10.3* 9.5*  HCT 32.0* 29.3*  PLT 305 289  MCV 92.5 92.7  MCH 29.8 30.1   MCHC 32.2 32.4  RDW 15.2 15.0  LYMPHSABS 1.1 1.1  MONOABS 1.4* 1.7*  EOSABS 0.2 0.3  BASOSABS 0.1 0.1    Recent Labs  Lab 10/28/23 0947 10/29/23 0605 10/30/23 0703  NA 135  --  137  K 4.2  --  4.1  CL 100  --  100  CO2 25  --  26  ANIONGAP 10  --  11  GLUCOSE 190*  --  160*  BUN 12  --  12  CREATININE 0.80  --  0.79  AST 16  --   --   ALT 12  --   --   ALKPHOS 82  --   --   BILITOT 0.7  --   --   ALBUMIN  3.6  --   --   CRP 1.4* 1.1* 1.1*  PROCALCITON  --  <0.10 <0.10  HGBA1C 5.9*  --   --   BNP  --   --  48.7  MG  --   --  1.7  CALCIUM  9.4  --  8.9    Total Time in preparing paper work, data evaluation and todays exam - 35 minutes  Signature  -    Lavada Stank M.D on 10/31/2023 at 8:36 AM   -  To page go to [email or web address]

## 2023-10-31 NOTE — Care Management Important Message (Signed)
 Important Message  Patient Details  Name: Randy Jacobson MRN: [id number] Date of Birth: 03/12/1946   Important Message Given:  Yes - Medicare IM     Claretta Deed 10/31/2023, 4:35 PM

## 2023-10-31 NOTE — Progress Notes (Signed)
 AVS completed.  No PIV.  Walker at bedside.   Tele unit called.   TOC meds given to patient.  Pt given time to ask questions.  Volunteers called for transport to main entrance for discharge.

## 2023-10-31 NOTE — Care Management Important Message (Signed)
 Important Message  Patient Details  Name: Randy Jacobson MRN: [id number] Date of Birth: 12-24-1945   Important Message Given:  Yes - Medicare IM   Patient left prior to IM delivery will mail to the patient home address.   Verlon Pischke 10/31/2023, 4:42 PM

## 2023-10-31 NOTE — TOC Transition Note (Signed)
 Transition of Care [facility]) - Discharge Note   Patient Details  Name: Randy Jacobson MRN: [id number] Date of Birth: [birth year]  Transition of Care [facility]) CM/SW Contact:  Robynn Eileen Hoose, RN Phone Number: 10/31/2023, 9:18 AM   Clinical Narrative:   Patient is being discharged home today. Spoke with patient at bedside, RW noted to be at bedside, pt confirmed that is belongs to him. Patient has a ride home through Mr. Everrett Lacasse.    Final next level of care: Home/Self Care Barriers to Discharge: No Barriers Identified   Patient Goals and CMS Choice            Discharge Placement                       Discharge Plan and Services Additional resources added to the After Visit Summary for                                       Social Drivers of Health (SDOH) Interventions SDOH Screenings   Food Insecurity: No Food Insecurity (10/30/2023)  Housing: Low Risk  (10/30/2023)  Transportation Needs: No Transportation Needs (10/30/2023)  Utilities: Not At Risk (10/30/2023)  Depression (PHQ2-9): Low Risk  (09/07/2023)  Financial Resource Strain: Low Risk  (01/29/2022)   Received from [facility]  Social Connections: Moderately Isolated (10/30/2023)  Stress: No Stress Concern Present (01/29/2022)   Received from [facility]  Tobacco Use: Medium Risk (10/29/2023)     Readmission Risk Interventions    10/18/2022   12:30 PM  Readmission Risk Prevention Plan  Post Dischage Appt Complete  Medication Screening Complete  Transportation Screening Complete

## 2023-11-01 ENCOUNTER — Telehealth: Payer: Self-pay | Admitting: *Deleted

## 2023-11-01 LAB — SURGICAL PATHOLOGY

## 2023-11-01 NOTE — Transitions of Care (Post Inpatient/ED Visit) (Signed)
 11/01/2023  Name: Randy Jacobson MRN: [id number] DOB: 11-Oct-1945  Today's TOC FU Call Status: Today's TOC FU Call Status:: Successful TOC FU Call Completed TOC FU Call Complete Date: 11/01/23 Patient's Name and Date of Birth confirmed.  Transition Care Management Follow-up Telephone Call Date of Discharge: 10/31/23 Discharge Facility: [facility]) Type of Discharge: Inpatient Admission Primary Inpatient Discharge Diagnosis:: Osteomyelitis of right foot How have you been since you were released from the hospital?: Better Any questions or concerns?: Yes Patient Questions/Concerns:: Patient didn't understand the use of the medications Patient Questions/Concerns Addressed: Other: (RN went over medications and explained what they are for.)  Items Reviewed: Did you receive and understand the discharge instructions provided?: Yes Medications obtained,verified, and reconciled?: Yes (Medications Reviewed) Any new allergies since your discharge?: No Dietary orders reviewed?: Yes Type of Diet Ordered:: Heart Healthy low carbohydrate Do you have support at home?: Yes People in Home [RPT]: spouse Name of Support/Comfort Primary Source: [name]  Medications Reviewed Today: Medications Reviewed Today     Reviewed by Kennieth Cathlean DEL, RN (Case Manager) on 11/01/23 at 1255  Med List Status: <None>   Medication Order Taking? Sig Documenting Provider Last Dose Status Informant  acetaminophen  (TYLENOL ) 500 MG tablet [id number] Yes Take 1 tablet (500 mg total) by mouth every 8 (eight) hours as needed for mild pain (pain score 1-3) or fever. Singh, Prashant K, MD  Active   amLODipine  (NORVASC ) 10 MG tablet [id number] Yes Take 1 tablet (10 mg total) by mouth daily. Singh, Prashant K, MD  Active   Ascorbic Acid (VITAMIN C ) 1000 MG tablet [id number] Yes Take 1 tablet (1,000 mg total) by mouth daily. Medina-Vargas, Monina C, NP  Active Self, Pharmacy Records, Multiple Informants  aspirin  EC 81 MG tablet  [id number] Yes Take 1 tablet (81 mg total) by mouth daily at 6 (six) AM. Swallow whole. Elna Ahmed SQUIBB, PA-C  Active Self, Pharmacy Records, Multiple Informants  atorvastatin  (LIPITOR) 40 MG tablet [id number] Yes Take 1 tablet (40 mg total) by mouth daily. Medina-Vargas, Monina C, NP  Active Self, Pharmacy Records, Multiple Informants  ciprofloxacin  (CIPRO ) 500 MG tablet [id number] Yes Take 1 tablet (500 mg total) by mouth 2 (two) times daily. Singh, Prashant K, MD  Active   clopidogrel  (PLAVIX ) 75 MG tablet [id number]  Take 1 tablet (75 mg total) by mouth daily. Medina-Vargas, Monina C, NP  Active Self, Pharmacy Records, Multiple Informants  Continuous Glucose Sensor (FREESTYLE LIBRE 2 SENSOR) MISC [id number] Yes 2 Devices by Does not apply route daily. E11.69 Medina-Vargas, Monina C, NP  Active Self, Pharmacy Records, Multiple Informants  ferrous sulfate  325 (65 FE) MG tablet [id number] Yes Take 1 tablet (325 mg total) by mouth daily with breakfast. Medina-Vargas, Monina C, NP  Active Self, Pharmacy Records, Multiple Informants  gabapentin  (NEURONTIN ) 100 MG capsule [id number] Yes Take 1 capsule (100 mg total) by mouth 3 (three) times daily. Ngetich, Dinah C, NP  Active Self, Pharmacy Records, Multiple Informants  hydrochlorothiazide  (HYDRODIURIL ) 25 MG tablet [id number] Yes Take 0.5 tablets (12.5 mg total) by mouth daily. Medina-Vargas, Monina C, NP  Active Self, Pharmacy Records, Multiple Informants  HYDROcodone -acetaminophen  (NORCO/VICODIN) 5-325 MG tablet [id number] Yes Take 1 tablet by mouth every 12 (twelve) hours as needed for severe pain (pain score 7-10). Singh, Prashant K, MD  Active   losartan  (COZAAR ) 50 MG tablet [id number] Yes Take 1 tablet (50 mg total) by mouth in the morning and at bedtime. Medina-Vargas, Monina C, NP  Active Self, Pharmacy  Records, Multiple Informants  Melatonin 10 MG TABS [id number] Yes Take 10 mg by mouth at bedtime. [provider]  Active Self, Pharmacy Records,  Multiple Informants  metFORMIN  (GLUCOPHAGE ) 1000 MG tablet [id number] Yes Take 1 tablet (1,000 mg total) by mouth 2 (two) times daily with a meal. Medina-Vargas, Monina C, NP  Active Self, Pharmacy Records, Multiple Informants  pantoprazole  (PROTONIX ) 40 MG tablet [id number] Yes Take 1 tablet (40 mg total) by mouth daily. Medina-Vargas, Monina C, NP  Active Self, Pharmacy Records, Multiple Informants  silver sulfADIAZINE (SILVADENE) 1 % cream [id number] Yes Apply 1 Application topically daily. [provider]  Active Self, Pharmacy Records, Multiple Informants  sitaGLIPtin  (JANUVIA ) 50 MG tablet [id number] Yes Take 1 tablet (50 mg total) by mouth daily as needed (for high blood sugar). Medina-Vargas, Monina C, NP  Active Self, Pharmacy Records, Multiple Informants  TRESIBA  FLEXTOUCH 100 UNIT/ML FlexTouch Pen [id number] Yes Inject 38 Units into the skin daily. Medina-Vargas, Monina C, NP  Active Self, Pharmacy Records, Multiple Informants            Home Care and Equipment/Supplies: Were Home Health Services Ordered?: NA Any new equipment or medical supplies ordered?: NA  Functional Questionnaire: Do you need assistance with bathing/showering or dressing?: Yes Do you need assistance with meal preparation?: Yes Do you need assistance with eating?: No Do you have difficulty maintaining continence: No Do you need assistance with getting out of bed/getting out of a chair/moving?: No Do you have difficulty managing or taking your medications?: No  Follow up appointments reviewed: PCP Follow-up appointment confirmed?: Yes Date of PCP follow-up appointment?: 11/14/23 Follow-up Provider: Jereld Shark- [facility] Follow-up appointment confirmed?: Yes Date of Specialist follow-up appointment?: 11/07/23 Follow-Up Specialty Provider:: Dr Thresa Sar Do you need transportation to your follow-up appointment?: No Do you understand care options if your condition(s) worsen?:  Yes-patient verbalized understanding  SDOH Interventions Today    Flowsheet Row Most Recent Value  SDOH Interventions   Food Insecurity Interventions Intervention Not Indicated  Housing Interventions Intervention Not Indicated  Transportation Interventions Intervention Not Indicated, Patient Resources (Friends/Family)  Utilities Interventions Intervention Not Indicated    Goals Addressed             This Visit's Progress    VBCI Transitions of Care (TOC) Care Plan       Problems:  Recent Hospitalization for treatment of Osteomyelitis of right foot Knowledge Deficit Related to Osteomyelitis of rt foot  Goal:  Over the next 30 days, the patient will not experience hospital readmission Patient will have any falls  Interventions:  Transitions of Care: Doctor Visits  - discussed the importance of doctor visits  Patient Self Care Activities:  Attend all scheduled provider appointments Call pharmacy for medication refills 3-7 days in advance of running out of medications Call provider office for new concerns or questions  Notify RN Care Manager of TOC call rescheduling needs Participate in Transition of Care Program/Attend TOC scheduled calls Take medications as prescribed   Activity: 50%, partial weightbearing in the right foot with the surgical boot on at all times when you   are out of the bed.   Full fall precautions use walker/cane & assistance as needed  Monitor CBG AC  Plan:  An initial telephone outreach has been scheduled for: 11/11/2023 Next PCP appointment scheduled for: 11/14/2023 Telephone follow up appointment with care management team member scheduled for:  08/012025 Dionne Leath 1:00 PM       The patient has  been provided with contact information for the care management team and has been advised to call with any health-related questions or concerns. The patient verbalized understanding with current POC. The patient is directed to their insurance card  regarding availability of benefits coverage   Cathlean Headland BSN RN [facility] Health Care Management Coordinator [email or web address] Direct Dial: [phone]  Fax: [phone] Website: [email or web address]

## 2023-11-01 NOTE — Anesthesia Postprocedure Evaluation (Signed)
 Anesthesia Post Note  Patient: Randy Jacobson  Procedure(s) Performed: AMPUTATION, FOOT, RAY (Right: Foot)     Patient location during evaluation: PACU Anesthesia Type: MAC Level of consciousness: awake and alert Pain management: pain level controlled Vital Signs Assessment: post-procedure vital signs reviewed and stable Respiratory status: spontaneous breathing, nonlabored ventilation and respiratory function stable Cardiovascular status: stable and blood pressure returned to baseline Postop Assessment: no apparent nausea or vomiting Anesthetic complications: no   No notable events documented.                 Siearra Amberg

## 2023-11-01 NOTE — Transitions of Care (Post Inpatient/ED Visit) (Signed)
   11/01/2023  Name: Randy Jacobson MRN: [id number] DOB: [birth year]  Today's TOC FU Call Status: Today's TOC FU Call Status:: Unsuccessful Call (1st Attempt) Unsuccessful Call (1st Attempt) Date: 11/01/23  Attempted to reach the patient regarding the most recent Inpatient/ED visit.  Follow Up Plan: Additional outreach attempts will be made to reach the patient to complete the Transitions of Care (Post Inpatient/ED visit) call.   Cathlean Headland BSN RN [facility] Health Care Management Coordinator [email or web address] Direct Dial: [phone]  Fax: [phone] Website: [email or web address]

## 2023-11-02 LAB — CULTURE, BLOOD (ROUTINE X 2)
Culture: NO GROWTH
Culture: NO GROWTH
Special Requests: ADEQUATE

## 2023-11-03 ENCOUNTER — Encounter: Payer: Self-pay | Admitting: Nurse Practitioner

## 2023-11-03 ENCOUNTER — Inpatient Hospital Stay: Attending: Oncology

## 2023-11-03 ENCOUNTER — Inpatient Hospital Stay: Admitting: Nurse Practitioner

## 2023-11-03 VITALS — BP 114/58 | HR 100 | Temp 98.3°F | Resp 18 | Ht 70.0 in | Wt 175.4 lb

## 2023-11-03 DIAGNOSIS — E611 Iron deficiency: Secondary | ICD-10-CM | POA: Diagnosis not present

## 2023-11-03 DIAGNOSIS — D649 Anemia, unspecified: Secondary | ICD-10-CM | POA: Diagnosis not present

## 2023-11-03 DIAGNOSIS — D509 Iron deficiency anemia, unspecified: Secondary | ICD-10-CM | POA: Diagnosis not present

## 2023-11-03 LAB — CBC WITH DIFFERENTIAL (CANCER CENTER ONLY)
Abs Immature Granulocytes: 0.15 K/uL — ABNORMAL HIGH (ref 0.00–0.07)
Basophils Absolute: 0.1 K/uL (ref 0.0–0.1)
Basophils Relative: 1 %
Eosinophils Absolute: 0.4 K/uL (ref 0.0–0.5)
Eosinophils Relative: 4 %
HCT: 30.4 % — ABNORMAL LOW (ref 39.0–52.0)
Hemoglobin: 9.9 g/dL — ABNORMAL LOW (ref 13.0–17.0)
Immature Granulocytes: 2 %
Lymphocytes Relative: 12 %
Lymphs Abs: 1.3 K/uL (ref 0.7–4.0)
MCH: 29.9 pg (ref 26.0–34.0)
MCHC: 32.6 g/dL (ref 30.0–36.0)
MCV: 91.8 fL (ref 80.0–100.0)
Monocytes Absolute: 1.3 K/uL — ABNORMAL HIGH (ref 0.1–1.0)
Monocytes Relative: 13 %
Neutro Abs: 7.2 K/uL (ref 1.7–7.7)
Neutrophils Relative %: 68 %
Platelet Count: 352 K/uL (ref 150–400)
RBC: 3.31 MIL/uL — ABNORMAL LOW (ref 4.22–5.81)
RDW: 14.8 % (ref 11.5–15.5)
WBC Count: 10.3 K/uL (ref 4.0–10.5)
nRBC: 0 % (ref 0.0–0.2)

## 2023-11-03 LAB — SAVE SMEAR(SSMR), FOR PROVIDER SLIDE REVIEW

## 2023-11-03 LAB — AEROBIC/ANAEROBIC CULTURE W GRAM STAIN (SURGICAL/DEEP WOUND): Culture: NO GROWTH

## 2023-11-03 LAB — FERRITIN: Ferritin: 60 ng/mL (ref 24–336)

## 2023-11-03 NOTE — Progress Notes (Addendum)
  [facility] OFFICE PROGRESS NOTE   Diagnosis: Iron  deficiency anemia  INTERVAL HISTORY:   Randy Jacobson returns for follow-up.  He continues oral iron .  He was hospitalized 10/28/2023 through 10/31/2023 for evaluation of osteomyelitis metatarsal right foot.  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  He denies bleeding.  He does not crave ice.  No tongue soreness.  He is tolerating oral iron  without constipation or nausea.  Objective:  Vital signs in last 24 hours:  Blood pressure (!) 114/58, pulse 100, temperature 98.3 F (36.8 C), temperature source Temporal, resp. rate 18, height 5' 10 (1.778 m), weight 175 lb 6.4 oz (79.6 kg), SpO2 98%.    Resp: Lungs clear bilaterally. Cardio: Regular rate and rhythm. GI: No hepatosplenomegaly. Vascular: No leg edema. Skin: Right foot is wrapped.   Lab Results:  Lab Results  Component Value Date   WBC 10.3 11/03/2023   HGB 9.9 (L) 11/03/2023   HCT 30.4 (L) 11/03/2023   MCV 91.8 11/03/2023   PLT 352 11/03/2023   NEUTROABS 7.2 11/03/2023  Peripheral blood smear-microcytes, ovalocytes, few teardrops, rare target cells, few cigar cells, increased polychromasia with some basophilic stippling; increased bands, hypolobated neutrophils; platelets appear increased.  Imaging:  No results found.  Medications: I have reviewed the patient's current medications.  Assessment/Plan: Anemia Chronic anemia with intermittent Red cell microcytosis Documented iron  deficiency on repeat ferritin testing including 05/23/2023 He has received Feraheme  and Venofer  in the past 01/16/2023 fecal occult blood positive; negative GI workup for a source of blood loss including upper endoscopy, colonoscopy, and small bowel camera endoscopy 01/18/2023 Feraheme  05/26/2023, 06/02/2023 10/06/2023 normal B12, DAT negative, myeloma panel negative Positive stool cards x 3 10/20/2023, 10/22/2023, 10/25/2023 History of iron  deficiency  anemia in 2018 Gastric ulcers 2018 4.   Diabetes 5.   Hypertension 6.   Peripheral vascular disease-maintained on aspirin  and Plavix  7.   Carotid artery stenosis 8.   Peripheral neuropathy  Disposition: Randy Jacobson appears stable.  We reviewed the CBC from today.  He continues to have anemia, hemoglobin is stable.  Ferritin is pending but may be somewhat difficult to interpret given the recent infection and surgery.  We will review the peripheral blood smear.  If there continues to be evidence of iron  deficiency the plan is to proceed with IV iron .  He will continue oral iron .  He will return for labs in 4 weeks and lab/office visit in 8 weeks.    Randy Jacobson ANP/GNP-BC   11/03/2023  2:00 PM

## 2023-11-04 ENCOUNTER — Other Ambulatory Visit: Payer: Self-pay | Admitting: Vascular Surgery

## 2023-11-04 DIAGNOSIS — I739 Peripheral vascular disease, unspecified: Secondary | ICD-10-CM

## 2023-11-04 DIAGNOSIS — I70221 Atherosclerosis of native arteries of extremities with rest pain, right leg: Secondary | ICD-10-CM

## 2023-11-07 ENCOUNTER — Ambulatory Visit (INDEPENDENT_AMBULATORY_CARE_PROVIDER_SITE_OTHER)

## 2023-11-07 ENCOUNTER — Encounter: Payer: Self-pay | Admitting: Podiatry

## 2023-11-07 ENCOUNTER — Ambulatory Visit (INDEPENDENT_AMBULATORY_CARE_PROVIDER_SITE_OTHER): Admitting: Podiatry

## 2023-11-07 VITALS — Ht 70.0 in | Wt 175.4 lb

## 2023-11-07 DIAGNOSIS — L97514 Non-pressure chronic ulcer of other part of right foot with necrosis of bone: Secondary | ICD-10-CM | POA: Diagnosis not present

## 2023-11-07 NOTE — Progress Notes (Signed)
 Chief Complaint  Patient presents with   Routine Post Op     POV #1 DOS: 10/29/2023- AMPUTATION, FOOT, RAY (Right), pt states his foot feels ok, no new blood or discharge.     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  Inpatient.  DOS: 10/29/2023.  Doing well.  WBAT surgical shoe as instructed  Past Medical History:  Diagnosis Date   Diabetes mellitus without complication (HCC)    Dysrhythmia    chronic RBBB; bradycardia s/p atropine  following sheath removal, 3.8 second sinus pause 10/11/22   Hypertension    Peripheral arterial disease (HCC)    Peripheral artery disease (HCC)     Past Surgical History:  Procedure Laterality Date   stents in both my legs     ABDOMINAL AORTOGRAM W/LOWER EXTREMITY N/A 10/11/2022   Procedure: ABDOMINAL AORTOGRAM W/LOWER EXTREMITY;  Surgeon: Eliza Lonni RAMAN, MD;  Location: [facility] INVASIVE CV LAB;  Service: Cardiovascular;  Laterality: N/A;   ABDOMINAL AORTOGRAM W/LOWER EXTREMITY Left 06/20/2023   Procedure: ABDOMINAL AORTOGRAM W/LOWER EXTREMITY;  Surgeon: Pearline Norman RAMAN, MD;  Location: [facility] INVASIVE CV LAB;  Service: Cardiovascular;  Laterality: Left;   ABDOMINAL AORTOGRAM W/LOWER EXTREMITY N/A 10/17/2023   Procedure: ABDOMINAL AORTOGRAM W/LOWER EXTREMITY;  Surgeon: Pearline Norman RAMAN, MD;  Location: [facility] INVASIVE CV LAB;  Service: Cardiovascular;  Laterality: N/A;   AMPUTATION Right 10/29/2023   Procedure: AMPUTATION, FOOT, RAY;  Surgeon: Janit Thresa HERO, DPM;  Location: [facility] OR;  Service: Orthopedics/Podiatry;  Laterality: Right;  PARTIAL FIFTH RAY AMPUTATION   COLONOSCOPY WITH PROPOFOL  N/A 01/18/2023   Procedure: COLONOSCOPY WITH PROPOFOL ;  Surgeon: Saintclair Jasper, MD;  Location: [facility] ENDOSCOPY;  Service: Gastroenterology;  Laterality: N/A;   ENDARTERECTOMY FEMORAL Left 10/15/2022   Procedure: ENDARTERECTOMY OF LEFT ILIOFEMORAL ARTERY;  Surgeon: Eliza Lonni RAMAN, MD;  Location: [facility] OR;  Service: Vascular;   Laterality: Left;   ENDARTERECTOMY FEMORAL Right 06/23/2023   Procedure: COMMON FEMORAL ENDARTERECTOMY;  Surgeon: Pearline Norman RAMAN, MD;  Location: [facility] OR;  Service: Vascular;  Laterality: Right;   ESOPHAGOGASTRODUODENOSCOPY (EGD) WITH PROPOFOL  N/A 01/18/2023   Procedure: ESOPHAGOGASTRODUODENOSCOPY (EGD) WITH PROPOFOL ;  Surgeon: Saintclair Jasper, MD;  Location: [facility] ENDOSCOPY;  Service: Gastroenterology;  Laterality: N/A;   FEMORAL-POPLITEAL BYPASS GRAFT Left 10/15/2022   Procedure: LEFT FEMORAL-BELOW KNEE POPLITEAL ARTERY BYPASS WITH VEIN HARVEST OF LEFT GREATER SAPHENOUS VEIN;  Surgeon: Eliza Lonni RAMAN, MD;  Location: [facility] OR;  Service: Vascular;  Laterality: Left;   GIVENS CAPSULE STUDY N/A 04/10/2017   Procedure: GIVENS CAPSULE STUDY;  Surgeon: Dianna Specking, MD;  Location: [facility] ENDOSCOPY;  Service: Endoscopy;  Laterality: N/A;   GIVENS CAPSULE STUDY N/A 01/18/2023   Procedure: GIVENS CAPSULE STUDY;  Surgeon: Saintclair Jasper, MD;  Location: [facility] ENDOSCOPY;  Service: Gastroenterology;  Laterality: N/A;   INSERTION OF ILIAC STENT Right 06/23/2023   Procedure: INSERTION, STENT, ARTERY, ILIAC;  Surgeon: Pearline Norman RAMAN, MD;  Location: [facility] OR;  Service: Vascular;  Laterality: Right;   IR GENERIC HISTORICAL  08/26/2015   IR RADIOLOGIST EVAL & MGMT 08/26/2015 Wilkie Lent, MD [facility] INTERV RAD   IR GENERIC HISTORICAL  04/21/2016   IR RADIOLOGIST EVAL & MGMT 04/21/2016 Wilkie Lent, MD [facility] INTERV RAD   LOWER EXTREMITY ANGIOGRAM Right 06/23/2023   Procedure: GERALYN, LOWER EXTREMITY;  Surgeon: Pearline Norman RAMAN, MD;  Location: [facility] OR;  Service: Vascular;  Laterality: Right;   LOWER EXTREMITY INTERVENTION Left 06/20/2023   Procedure: LOWER EXTREMITY INTERVENTION;  Surgeon:  Pearline Norman RAMAN, MD;  Location: [facility] INVASIVE CV LAB;  Service: Cardiovascular;  Laterality: Left;  DCB   LOWER EXTREMITY INTERVENTION N/A 10/17/2023   Procedure: LOWER EXTREMITY INTERVENTION;  Surgeon: Pearline Norman RAMAN, MD;  Location: [facility]  INVASIVE CV LAB;  Service: Cardiovascular;  Laterality: N/A;   PATCH ANGIOPLASTY Left 10/15/2022   Procedure: PATCH ANGIOPLASTY USING XENOSURE BIOLOGIC PATCH 1CMX14CM;  Surgeon: Eliza Lonni RAMAN, MD;  Location: [facility] OR;  Service: Vascular;  Laterality: Left;   PATCH ANGIOPLASTY Right 06/23/2023   Procedure: ANGIOPLASTY, USING PATCH GRAFT 6CM GEORGE;  Surgeon: Pearline Norman RAMAN, MD;  Location: [facility] OR;  Service: Vascular;  Laterality: Right;   PERIPHERAL VASCULAR INTERVENTION  10/11/2022   Procedure: PERIPHERAL VASCULAR INTERVENTION;  Surgeon: Eliza Lonni RAMAN, MD;  Location: [facility] INVASIVE CV LAB;  Service: Cardiovascular;;    No Known Allergies  Objective/Physical Exam Neurovascular status intact.  Incision well coapted with sutures intact. No sign of infectious process noted. No dehiscence. No active bleeding noted.    Radiographic Exam RT foot 11/07/2023:  Partial fifth ray amputation right foot with clean osteotomy at the base of the fifth metatarsal.  No erosions concerning for osteomyelitis.  Staples noted along the lateral aspect of the foot  Assessment: 1. 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 DOS: 10/29/2023   Plan of Care:  -Patient was evaluated. X-rays reviewed -Dressings changed.  Leave clean dry and intact x 1 week -Continue WBAT surgical shoe -Patient has completed oral antibiotics at discharge.  Clinically no need for refill -Return to clinic 1 week dressing change.  Plan for staple removal at 3 weeks postop   Thresa EMERSON Sar, DPM [facility]  Dr. Thresa EMERSON Sar, DPM    [address] [place], [place] [address]                Office [phone]  Fax [phone]

## 2023-11-09 ENCOUNTER — Other Ambulatory Visit: Payer: Self-pay | Admitting: Adult Health

## 2023-11-10 ENCOUNTER — Other Ambulatory Visit: Payer: Self-pay | Admitting: Adult Health

## 2023-11-10 ENCOUNTER — Telehealth

## 2023-11-11 ENCOUNTER — Other Ambulatory Visit: Payer: Self-pay

## 2023-11-11 NOTE — Transitions of Care (Post Inpatient/ED Visit) (Signed)
 Transition of Care week 2  Visit Note  11/11/2023  Name: Randy Jacobson MRN: [id number]          DOB: [birth year]  Situation: Patient enrolled in [facility] 30-day program. Visit completed with patient by telephone. Patient states he only wants to calls from his doctor.  Explained to patient that CM was with his physician office.  He still declined further calls.   Background:   Initial Transition Care Management Follow-up Telephone Call    Past Medical History:  Diagnosis Date   Diabetes mellitus without complication (HCC)    Dysrhythmia    chronic RBBB; bradycardia s/p atropine  following sheath removal, 3.8 second sinus pause 10/11/22   Hypertension    Peripheral arterial disease (HCC)    Peripheral artery disease (HCC)     Assessment: Patient declined Patient Reported Symptoms: Cognitive        Neurological      HEENT        Cardiovascular      Respiratory      Endocrine      Gastrointestinal        Genitourinary      Integumentary      Musculoskeletal          Psychosocial           There were no vitals filed for this visit.  Medications Reviewed Today     Reviewed by Mireya Meditz, RN (Case Manager) on 11/11/23 at 1313  Med List Status: <None>   Medication Order Taking? Sig Documenting Provider Last Dose Status Informant  acetaminophen  (TYLENOL ) 500 MG tablet [id number] Yes Take 1 tablet (500 mg total) by mouth every 8 (eight) hours as needed for mild pain (pain score 1-3) or fever. Singh, Prashant K, MD  Active   amLODipine  (NORVASC ) 10 MG tablet [id number] Yes Take 1 tablet (10 mg total) by mouth daily. Singh, Prashant K, MD  Active   Ascorbic Acid (VITAMIN C ) 1000 MG tablet [id number] Yes Take 1 tablet (1,000 mg total) by mouth daily. Medina-Vargas, Monina C, NP  Active Self, Pharmacy Records, Multiple Informants  aspirin  EC 81 MG tablet [id number] Yes Take 1 tablet (81 mg total) by mouth daily at 6 (six) AM. Swallow whole. Elna Ahmed SQUIBB, PA-C  Active  Self, Pharmacy Records, Multiple Informants  atorvastatin  (LIPITOR) 40 MG tablet [id number] Yes Take 1 tablet by mouth once daily Medina-Vargas, Monina C, NP  Active   ciprofloxacin  (CIPRO ) 500 MG tablet [id number] Yes Take 1 tablet (500 mg total) by mouth 2 (two) times daily. Singh, Prashant K, MD  Active   clopidogrel  (PLAVIX ) 75 MG tablet [id number] Yes Take 1 tablet (75 mg total) by mouth daily. Medina-Vargas, Monina C, NP  Active Self, Pharmacy Records, Multiple Informants  Continuous Glucose Sensor (FREESTYLE LIBRE 2 SENSOR) MISC [id number] Yes 2 Devices by Does not apply route daily. E11.69 Medina-Vargas, Monina C, NP  Active Self, Pharmacy Records, Multiple Informants  ferrous sulfate  325 (65 FE) MG tablet [id number] Yes Take 1 tablet (325 mg total) by mouth daily with breakfast. Medina-Vargas, Monina C, NP  Active Self, Pharmacy Records, Multiple Informants  gabapentin  (NEURONTIN ) 100 MG capsule [id number] Yes Take 1 capsule (100 mg total) by mouth 3 (three) times daily. Ngetich, Roxan BROCKS, NP  Active Self, Pharmacy Records, Multiple Informants  hydrochlorothiazide  (HYDRODIURIL ) 25 MG tablet [id number] Yes Take 0.5 tablets (12.5 mg total) by mouth daily. Medina-Vargas, Monina C, NP  Active Self, Pharmacy Records, Multiple Informants  HYDROcodone -acetaminophen  (NORCO/VICODIN) 5-325 MG tablet  [id number] Yes Take 1 tablet by mouth every 12 (twelve) hours as needed for severe pain (pain score 7-10). Singh, Prashant K, MD  Active   losartan  (COZAAR ) 50 MG tablet [id number] Yes Take 1 tablet (50 mg total) by mouth in the morning and at bedtime. Medina-Vargas, Monina C, NP  Active Self, Pharmacy Records, Multiple Informants  Melatonin 10 MG TABS [id number] Yes Take 10 mg by mouth at bedtime. [provider]  Active Self, Pharmacy Records, Multiple Informants  metFORMIN  (GLUCOPHAGE ) 1000 MG tablet [id number] Yes TAKE 1 TABLET BY MOUTH TWICE DAILY WITH A MEAL Medina-Vargas, Monina C, NP  Active    pantoprazole  (PROTONIX ) 40 MG tablet [id number] Yes Take 1 tablet (40 mg total) by mouth daily. Medina-Vargas, Monina C, NP  Active Self, Pharmacy Records, Multiple Informants  silver sulfADIAZINE (SILVADENE) 1 % cream [id number] Yes Apply 1 Application topically daily. [provider]  Active Self, Pharmacy Records, Multiple Informants  sitaGLIPtin  (JANUVIA ) 50 MG tablet [id number] Yes Take 1 tablet (50 mg total) by mouth daily as needed (for high blood sugar). Medina-Vargas, Monina C, NP  Active Self, Pharmacy Records, Multiple Informants  TRESIBA  FLEXTOUCH 100 UNIT/ML FlexTouch Pen [id number] Yes Inject 38 Units into the skin daily. Medina-Vargas, Monina C, NP  Active Self, Pharmacy Records, Multiple Informants            Recommendation:   PCP Follow-up  Follow Up Plan:   Closing From:  Transitions of Care Program -Patient declined further calls.   Dalma Panchal J. Salome Cozby RN, MSN [facility], [facility] RN Care Manager Direct Dial: [phone]  Fax: [phone] Website: [email or web address]

## 2023-11-11 NOTE — Patient Instructions (Signed)
 Visit Information  Thank you for taking time to visit with me today. Please don't hesitate to contact me if I can be of assistance to you   Following is a copy of your care plan:   Goals Addressed             This Visit's Progress    [facility] Transitions of Care (TOC) Care Plan       11/11/23-Patient refused further follow up.  Problems:  Recent Hospitalization for treatment of Osteomyelitis of right foot Knowledge Deficit Related to Osteomyelitis of rt foot  Goal:  Over the next 30 days, the patient will not experience hospital readmission Patient will have any falls  Interventions:  Transitions of Care: Doctor Visits  - discussed the importance of doctor visits  Patient Self Care Activities:  Attend all scheduled provider appointments Call pharmacy for medication refills 3-7 days in advance of running out of medications Call provider office for new concerns or questions  Notify RN Care Manager of TOC call rescheduling needs Participate in Transition of Care Program/Attend TOC scheduled calls Take medications as prescribed   Activity: 50%, partial weightbearing in the right foot with the surgical boot on at all times when you   are out of the bed.   Full fall precautions use walker/cane & assistance as needed  Monitor CBG AC  Plan:  An initial telephone outreach has been scheduled for: 11/11/2023 Next PCP appointment scheduled for: 11/14/2023 Telephone follow up appointment with care management team member scheduled for:  08/012025 Carrissa Taitano 1:00 PM        The patient verbalized understanding of instructions, educational materials, and care plan provided today and DECLINED offer to receive copy of patient instructions, educational materials, and care plan.   The patient has been provided with contact information for the care management team and has been advised to call with any health related questions or concerns.   Please call the care guide team at [phone] if  you need to cancel or reschedule your appointment.   Please call the Suicide and Crisis Lifeline: 988 if you are experiencing a Mental Health or Behavioral Health Crisis or need someone to talk to.  Demarus Latterell J. Jacquelynne Guedes RN, MSN [facility], [facility] RN Care Manager Direct Dial: [phone]  Fax: [phone] Website: [email or web address]

## 2023-11-14 ENCOUNTER — Ambulatory Visit (HOSPITAL_COMMUNITY)
Admission: RE | Admit: 2023-11-14 | Discharge: 2023-11-14 | Disposition: A | Discharge status: Home / Self Care | Source: Ambulatory Visit | Attending: Adult Health | Admitting: Adult Health

## 2023-11-14 ENCOUNTER — Encounter: Payer: Self-pay | Admitting: Adult Health

## 2023-11-14 ENCOUNTER — Telehealth: Payer: Self-pay

## 2023-11-14 ENCOUNTER — Ambulatory Visit (INDEPENDENT_AMBULATORY_CARE_PROVIDER_SITE_OTHER): Admitting: Adult Health

## 2023-11-14 VITALS — BP 128/76 | HR 82 | Temp 97.6°F | Resp 18 | Ht 70.0 in | Wt 177.8 lb

## 2023-11-14 DIAGNOSIS — R531 Weakness: Secondary | ICD-10-CM | POA: Diagnosis not present

## 2023-11-14 DIAGNOSIS — K219 Gastro-esophageal reflux disease without esophagitis: Secondary | ICD-10-CM

## 2023-11-14 DIAGNOSIS — M869 Osteomyelitis, unspecified: Secondary | ICD-10-CM

## 2023-11-14 DIAGNOSIS — E611 Iron deficiency: Secondary | ICD-10-CM | POA: Diagnosis not present

## 2023-11-14 DIAGNOSIS — E785 Hyperlipidemia, unspecified: Secondary | ICD-10-CM | POA: Diagnosis not present

## 2023-11-14 DIAGNOSIS — I10 Essential (primary) hypertension: Secondary | ICD-10-CM | POA: Diagnosis not present

## 2023-11-14 DIAGNOSIS — E1169 Type 2 diabetes mellitus with other specified complication: Secondary | ICD-10-CM

## 2023-11-14 DIAGNOSIS — Z794 Long term (current) use of insulin: Secondary | ICD-10-CM

## 2023-11-14 DIAGNOSIS — J45909 Unspecified asthma, uncomplicated: Secondary | ICD-10-CM

## 2023-11-14 DIAGNOSIS — I739 Peripheral vascular disease, unspecified: Secondary | ICD-10-CM

## 2023-11-14 DIAGNOSIS — R0602 Shortness of breath: Secondary | ICD-10-CM | POA: Diagnosis not present

## 2023-11-14 MED ORDER — ALBUTEROL SULFATE HFA 108 (90 BASE) MCG/ACT IN AERS: 2.0000 | INHALATION_SPRAY | Freq: Four times a day (QID) | RESPIRATORY_TRACT | 0 refills | Status: AC | PRN

## 2023-11-14 NOTE — Progress Notes (Signed)
 [facility]  Provider:  Jereld Serum DNP  Code Status:  Full Code  Goals of Care:     10/17/2023    5:34 AM  Advanced Directives  Does Patient Have a Medical Advance Directive? Yes  Type of Estate agent of [place];Living will  Does patient want to make changes to medical advance directive? No - Patient declined     Chief Complaint  Patient presents with   Hospitalization Follow-up    HOSPITAL FOLLOW UP   Discussed the use of AI scribe software for clinical note transcription with the patient, who gave verbal consent to proceed.  HPI: Patient is a 78 y.o. male seen today for a hospitalization follow up.  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 Prior to hospitalization, he developed a blister on his right foot six to eight weeks earlier and was treated with doxycycline. Despite regular follow-ups with podiatry and night wound wrapping and Doxycycline, x-ray showed progressive osteomyelitis and bony destruction, necessitating the amputation. He reports that he does not use a walker. He was discharged on Cipro  and completed course. He is scheduled to follow up with podiatry on November 16, 2023, and at the vascular office on November 25, 2023.  He has a history of hypertension and is currently taking amlodipine  10 mg daily, losartan  50 mg twice daily, and hydrochlorothiazide  12.5 mg daily.  For type 2 diabetes, he takes Tresiba  38 units daily, Januvia  50 mg as needed, and metformin  1000 mg twice daily. He also manages dyslipidemia with atorvastatin  40 mg daily. He uses a glucose sensor for monitoring.  He is on Plavix  75 mg daily and aspirin  81 mg daily for peripheral arterial disease, following revascularization of his right leg on October 17, 2023, and a prior endarterectomy with profundoplasty and right external iliac stenting on June 24, 2023.  He has  iron  deficiency anemia with a stable hemoglobin level of 9.9 g/dL as of November 03, 2023, and is taking ferrous sulfate  325 mg daily. He is moving his bowels regularly despite taking iron  supplements.  He is taking pantoprazole  40 mg daily for acid reflux. He experiences shortness of breath and requested an albuterol  inhaler. He has asthma and noted to have slight wheezing which he attributes to a recent cold.  No current pain, dizziness, or fever.   Past Medical History:  Diagnosis Date   Diabetes mellitus without complication (HCC)    Dysrhythmia    chronic RBBB; bradycardia s/p atropine  following sheath removal, 3.8 second sinus pause 10/11/22   Hypertension    Peripheral arterial disease (HCC)    Peripheral artery disease (HCC)     Past Surgical History:  Procedure Laterality Date   stents in both my legs     ABDOMINAL AORTOGRAM W/LOWER EXTREMITY N/A 10/11/2022   Procedure: ABDOMINAL AORTOGRAM W/LOWER EXTREMITY;  Surgeon: Eliza Lonni RAMAN, MD;  Location: [facility] INVASIVE CV LAB;  Service: Cardiovascular;  Laterality: N/A;   ABDOMINAL AORTOGRAM W/LOWER EXTREMITY Left 06/20/2023   Procedure: ABDOMINAL AORTOGRAM W/LOWER EXTREMITY;  Surgeon: Pearline Norman RAMAN, MD;  Location: [facility] INVASIVE CV LAB;  Service: Cardiovascular;  Laterality: Left;   ABDOMINAL AORTOGRAM W/LOWER EXTREMITY N/A 10/17/2023   Procedure: ABDOMINAL AORTOGRAM W/LOWER EXTREMITY;  Surgeon: Pearline Norman RAMAN, MD;  Location: [facility] INVASIVE CV LAB;  Service: Cardiovascular;  Laterality: N/A;   AMPUTATION Right 10/29/2023   Procedure: AMPUTATION, FOOT,  RAY;  Surgeon: Janit Thresa HERO, DPM;  Location: [facility] OR;  Service: Orthopedics/Podiatry;  Laterality: Right;  PARTIAL FIFTH RAY AMPUTATION   COLONOSCOPY WITH PROPOFOL  N/A 01/18/2023   Procedure: COLONOSCOPY WITH PROPOFOL ;  Surgeon: Saintclair Jasper, MD;  Location: [facility] ENDOSCOPY;  Service: Gastroenterology;  Laterality: N/A;   ENDARTERECTOMY FEMORAL Left 10/15/2022   Procedure: ENDARTERECTOMY OF LEFT  ILIOFEMORAL ARTERY;  Surgeon: Eliza Lonni RAMAN, MD;  Location: [facility] OR;  Service: Vascular;  Laterality: Left;   ENDARTERECTOMY FEMORAL Right 06/23/2023   Procedure: COMMON FEMORAL ENDARTERECTOMY;  Surgeon: Pearline Norman RAMAN, MD;  Location: [facility] OR;  Service: Vascular;  Laterality: Right;   ESOPHAGOGASTRODUODENOSCOPY (EGD) WITH PROPOFOL  N/A 01/18/2023   Procedure: ESOPHAGOGASTRODUODENOSCOPY (EGD) WITH PROPOFOL ;  Surgeon: Saintclair Jasper, MD;  Location: [facility] ENDOSCOPY;  Service: Gastroenterology;  Laterality: N/A;   FEMORAL-POPLITEAL BYPASS GRAFT Left 10/15/2022   Procedure: LEFT FEMORAL-BELOW KNEE POPLITEAL ARTERY BYPASS WITH VEIN HARVEST OF LEFT GREATER SAPHENOUS VEIN;  Surgeon: Eliza Lonni RAMAN, MD;  Location: [facility] OR;  Service: Vascular;  Laterality: Left;   GIVENS CAPSULE STUDY N/A 04/10/2017   Procedure: GIVENS CAPSULE STUDY;  Surgeon: Dianna Specking, MD;  Location: [facility] ENDOSCOPY;  Service: Endoscopy;  Laterality: N/A;   GIVENS CAPSULE STUDY N/A 01/18/2023   Procedure: GIVENS CAPSULE STUDY;  Surgeon: Saintclair Jasper, MD;  Location: [facility] ENDOSCOPY;  Service: Gastroenterology;  Laterality: N/A;   INSERTION OF ILIAC STENT Right 06/23/2023   Procedure: INSERTION, STENT, ARTERY, ILIAC;  Surgeon: Pearline Norman RAMAN, MD;  Location: [facility] OR;  Service: Vascular;  Laterality: Right;   IR GENERIC HISTORICAL  08/26/2015   IR RADIOLOGIST EVAL & MGMT 08/26/2015 Wilkie Lent, MD [facility] INTERV RAD   IR GENERIC HISTORICAL  04/21/2016   IR RADIOLOGIST EVAL & MGMT 04/21/2016 Wilkie Lent, MD [facility] INTERV RAD   LOWER EXTREMITY ANGIOGRAM Right 06/23/2023   Procedure: GERALYN, LOWER EXTREMITY;  Surgeon: Pearline Norman RAMAN, MD;  Location: [facility] OR;  Service: Vascular;  Laterality: Right;   LOWER EXTREMITY INTERVENTION Left 06/20/2023   Procedure: LOWER EXTREMITY INTERVENTION;  Surgeon: Pearline Norman RAMAN, MD;  Location: [facility] INVASIVE CV LAB;  Service: Cardiovascular;  Laterality: Left;  DCB   LOWER EXTREMITY INTERVENTION N/A  10/17/2023   Procedure: LOWER EXTREMITY INTERVENTION;  Surgeon: Pearline Norman RAMAN, MD;  Location: [facility] INVASIVE CV LAB;  Service: Cardiovascular;  Laterality: N/A;   PATCH ANGIOPLASTY Left 10/15/2022   Procedure: PATCH ANGIOPLASTY USING XENOSURE BIOLOGIC PATCH 1CMX14CM;  Surgeon: Eliza Lonni RAMAN, MD;  Location: [facility] OR;  Service: Vascular;  Laterality: Left;   PATCH ANGIOPLASTY Right 06/23/2023   Procedure: ANGIOPLASTY, USING PATCH GRAFT 6CM GEORGE;  Surgeon: Pearline Norman RAMAN, MD;  Location: [facility] OR;  Service: Vascular;  Laterality: Right;   PERIPHERAL VASCULAR INTERVENTION  10/11/2022   Procedure: PERIPHERAL VASCULAR INTERVENTION;  Surgeon: Eliza Lonni RAMAN, MD;  Location: [facility] INVASIVE CV LAB;  Service: Cardiovascular;;    No Known Allergies  Outpatient Encounter Medications as of 11/14/2023  Medication Sig   acetaminophen  (TYLENOL ) 500 MG tablet Take 1 tablet (500 mg total) by mouth every 8 (eight) hours as needed for mild pain (pain score 1-3) or fever.   albuterol  (VENTOLIN  HFA) 108 (90 Base) MCG/ACT inhaler Inhale 2 puffs into the lungs every 6 (six) hours as needed for wheezing or shortness of breath.   amLODipine  (NORVASC ) 10 MG tablet Take 1 tablet (10 mg total) by mouth daily.   Ascorbic Acid (VITAMIN C ) 1000 MG tablet Take 1 tablet (1,000 mg total) by mouth daily.  aspirin  EC 81 MG tablet Take 1 tablet (81 mg total) by mouth daily at 6 (six) AM. Swallow whole.   atorvastatin  (LIPITOR) 40 MG tablet Take 1 tablet by mouth once daily   clopidogrel  (PLAVIX ) 75 MG tablet Take 1 tablet (75 mg total) by mouth daily.   Continuous Glucose Sensor (FREESTYLE LIBRE 2 SENSOR) MISC 2 Devices by Does not apply route daily. E11.69   ferrous sulfate  325 (65 FE) MG tablet Take 1 tablet (325 mg total) by mouth daily with breakfast.   gabapentin  (NEURONTIN ) 100 MG capsule Take 1 capsule (100 mg total) by mouth 3 (three) times daily.   hydrochlorothiazide  (HYDRODIURIL ) 25 MG tablet Take 0.5 tablets (12.5  mg total) by mouth daily.   losartan  (COZAAR ) 50 MG tablet Take 1 tablet (50 mg total) by mouth in the morning and at bedtime.   Melatonin 10 MG TABS Take 10 mg by mouth at bedtime.   metFORMIN  (GLUCOPHAGE ) 1000 MG tablet TAKE 1 TABLET BY MOUTH TWICE DAILY WITH A MEAL   pantoprazole  (PROTONIX ) 40 MG tablet Take 1 tablet (40 mg total) by mouth daily.   sitaGLIPtin  (JANUVIA ) 50 MG tablet Take 1 tablet (50 mg total) by mouth daily as needed (for high blood sugar).   TRESIBA  FLEXTOUCH 100 UNIT/ML FlexTouch Pen Inject 38 Units into the skin daily.   silver sulfADIAZINE (SILVADENE) 1 % cream Apply 1 Application topically daily. (Patient not taking: Reported on 11/14/2023)   [DISCONTINUED] ciprofloxacin  (CIPRO ) 500 MG tablet Take 1 tablet (500 mg total) by mouth 2 (two) times daily. (Patient not taking: Reported on 11/14/2023)   [DISCONTINUED] HYDROcodone -acetaminophen  (NORCO/VICODIN) 5-325 MG tablet Take 1 tablet by mouth every 12 (twelve) hours as needed for severe pain (pain score 7-10). (Patient not taking: Reported on 11/14/2023)   No facility-administered encounter medications on file as of 11/14/2023.    Review of Systems:  Review of Systems  Constitutional:  Negative for activity change, appetite change and fever.  HENT:  Negative for sore throat.   Eyes: Negative.   Cardiovascular:  Negative for chest pain and leg swelling.  Gastrointestinal:  Negative for abdominal distention, diarrhea and vomiting.  Genitourinary:  Negative for dysuria, frequency and urgency.  Skin:  Negative for color change.  Neurological:  Negative for dizziness and headaches.  Psychiatric/Behavioral:  Negative for behavioral problems and sleep disturbance. The patient is not nervous/anxious.     Health Maintenance  Topic Date Due   OPHTHALMOLOGY EXAM  Never done   Pneumococcal Vaccine: 50+ Years (2 of 2 - PCV) 06/11/2007   DTaP/Tdap/Td (2 - Tdap) 03/12/2013   Medicare Annual Wellness (AWV)  02/09/2023   INFLUENZA  VACCINE  11/11/2023   COVID-19 Vaccine (4 - Mixed Product risk 2024-25 season) 04/12/2024 (Originally 06/27/2023)   Diabetic kidney evaluation - Urine ACR  03/17/2024   HEMOGLOBIN A1C  04/29/2024   FOOT EXAM  05/17/2024   Diabetic kidney evaluation - eGFR measurement  10/29/2024   Zoster Vaccines- Shingrix  Completed   Hepatitis B Vaccines  Aged Out   HPV VACCINES  Aged Out   Meningococcal B Vaccine  Aged Out   Colonoscopy  Discontinued   Hepatitis C Screening  Discontinued    Physical Exam: Vitals:   11/14/23 0938  BP: 128/76  Pulse: 82  Resp: 18  Temp: 97.6 F (36.4 C)  SpO2: 99%  Weight: 177 lb 12.8 oz (80.6 kg)  Height: 5' 10 (1.778 m)   Body mass index is 25.51 kg/m. Physical Exam Constitutional:  Appearance: Normal appearance.  HENT:     Head: Normocephalic and atraumatic.     Mouth/Throat:     Mouth: Mucous membranes are moist.  Eyes:     Conjunctiva/sclera: Conjunctivae normal.  Cardiovascular:     Rate and Rhythm: Normal rate and regular rhythm.     Pulses: Normal pulses.     Heart sounds: Normal heart sounds.  Pulmonary:     Effort: Pulmonary effort is normal.     Breath sounds: Wheezing present.  Abdominal:     General: Bowel sounds are normal.     Palpations: Abdomen is soft.  Musculoskeletal:        General: No swelling. Normal range of motion.     Cervical back: Normal range of motion.  Skin:    General: Skin is warm and dry.     Comments: Right foot covered with dressing, no swelling  Neurological:     General: No focal deficit present.     Mental Status: He is alert and oriented to person, place, and time.  Psychiatric:        Mood and Affect: Mood normal.        Behavior: Behavior normal.        Thought Content: Thought content normal.        Judgment: Judgment normal.     Labs reviewed: Basic Metabolic Panel: Recent Labs    10/06/23 1150 10/17/23 0538 10/28/23 0947 10/30/23 0703  NA 138 141 135 137  K 4.8 3.9 4.2 4.1  CL  100 103 100 100  CO2 27  --  25 26  GLUCOSE 116* 101* 190* 160*  BUN 17 18 12 12   CREATININE 0.95 0.90 0.80 0.79  CALCIUM  9.6  --  9.4 8.9  MG  --   --   --  1.7   Liver Function Tests: Recent Labs    06/23/23 1526 10/06/23 1150 10/28/23 0947  AST 32 23 16  ALT 22 15 12   ALKPHOS 72 87 82  BILITOT 0.7 0.4 0.7  PROT 7.5 7.6 7.3  ALBUMIN  4.4 4.6 3.6   No results for input(s): LIPASE, AMYLASE in the last 8760 hours. No results for input(s): AMMONIA in the last 8760 hours. CBC: Recent Labs    10/28/23 0947 10/30/23 0703 11/03/23 1304  WBC 13.3* 13.8* 10.3  NEUTROABS 10.2* 10.4* 7.2  HGB 10.3* 9.5* 9.9*  HCT 32.0* 29.3* 30.4*  MCV 92.5 92.7 91.8  PLT 305 289 352   Lipid Panel: Recent Labs    03/18/23 0841 06/24/23 0345  CHOL 124 70  HDL 44 24*  LDLCALC 60 31  TRIG 114 74  CHOLHDL 2.8 2.9   Lab Results  Component Value Date   HGBA1C 5.9 (H) 10/28/2023    Procedures since last visit: DG Foot Complete Right Result Date: 11/10/2023 Please see detailed radiograph report in office note.  MR FOOT RIGHT WO CONTRAST Result Date: 10/31/2023 CLINICAL DATA:  Right foot osteomyelitis. EXAM: MRI OF THE RIGHT FOREFOOT WITHOUT CONTRAST TECHNIQUE: Multiplanar, multisequence MR imaging of the right forefoot was performed. No intravenous contrast was administered. COMPARISON:  Radiographs 10/27/2023 and 08/22/2023. FINDINGS: Technical note: Despite efforts by the technologist and patient, mild motion artifact is present on today's exam and could not be eliminated. This reduces exam sensitivity and specificity. Bones/Joint/Cartilage As shown on the recent outside radiographs, there is cortical destruction of the 5th metatarsal head with associated diffusely decreased T1 and increased T2 marrow signal throughout the shaft of the 5th  metatarsal. There is also marrow T2 hyperintensity and T1 hypointensity within the base of the 5th proximal phalanx. The other metatarsals and  phalanges appear intact. There are mild to moderate degenerative changes at the 1st metatarsophalangeal joint. No significant joint effusions. The alignment is normal at the Lisfranc joint. Ligaments Intact Lisfranc ligament. The collateral ligaments of the metatarsophalangeal joints appear intact. Muscles and Tendons Mild T2 hyperintensity within the forefoot flexor musculature with trace flexor digitorum tenosynovitis. No evidence of tendon tear. Soft tissues Soft tissue ulceration lateral to the 5th metatarsal head with underlying ill-defined soft tissue inflammatory changes. No organized fluid collection or foreign body identified. IMPRESSION: 1. Soft tissue ulceration lateral to the 5th metatarsal head with underlying osteomyelitis of the 5th metatarsal and base of the 5th proximal phalanx. 2. No evidence of abscess. 3. Mild to moderate degenerative changes at the 1st metatarsophalangeal joint. Electronically Signed   By: Elsie Perone M.D.   On: 10/31/2023 08:16   DG Foot Complete Right Result Date: 10/29/2023 CLINICAL DATA:  Foot osteomyelitis. EXAM: RIGHT FOOT COMPLETE - 3+ VIEW COMPARISON:  Radiograph 10/27/2023 FINDINGS: Transmetatarsal amputation of the fifth ray involving the proximal metatarsal. Resection margin is smooth. Expected postsurgical change in the overlying soft tissues. Overlying skin staples in place. The remainder the exam is unchanged, no findings of osteomyelitis in the remaining foot. IMPRESSION: Transmetatarsal amputation of the fifth ray. Electronically Signed   By: Andrea Gasman M.D.   On: 10/29/2023 18:21   DG Chest Port 1 View Result Date: 10/29/2023 CLINICAL DATA:  Shortness of breath. EXAM: PORTABLE CHEST 1 VIEW COMPARISON:  01/16/2023 FINDINGS: Stable cardiomediastinal contours. Aortic atherosclerosis. Lungs are clear. No pleural effusion, interstitial edema or consolidative change. Osseous structures appear intact. IMPRESSION: 1. No acute cardiopulmonary disease. 2.  Aortic Atherosclerosis (ICD10-I70.0). Electronically Signed   By: Waddell Calk M.D.   On: 10/29/2023 07:39   DG Foot Complete Right Result Date: 10/27/2023 Please see detailed radiograph report in office note.  PERIPHERAL VASCULAR CATHETERIZATION Result Date: 10/17/2023 Images from the original result were not included. Patient name: ABDALLA NARAMORE MRN: [id number] DOB: 1945/11/27 Sex: male 10/17/2023 Pre-operative Diagnosis: CLTI of RLE with 5th toe wound Post-operative diagnosis:  Same Surgeon:  Norman GORMAN Serve, MD Procedure Performed: Ultrasound-guided stenosis of the left common femoral artery Aortogram and right lower extremity angiogram Third order cannulation of right popliteal artery Intravascular lithotripsy of right SFA and popliteal arteries with 5 mm shockwave E8 Stenting of right SFA and popliteal arteries with 7 mm Eluvia stents Drug-coated balloon angioplasty of proximal right SFA with 6 mm Ranger Mynx closure of left common femoral artery 89 minutes of moderate sedation with fentanyl  and Versed  Indications: Mr. Nims is a 78 year old male with PAD who has had multiple interventions.  The left leg has a femoral-popliteal bypass.  The right recently underwent a common femoral endarterectomy.  He presents clinic for follow-up he was noted to have a right fifth toe wound and ABIs demonstrating severe arterial insufficiency with a toe pressure of 0.  Risks and benefits of angiogram with intervention were reviewed, he expressed understanding elected to proceed. Findings: Widely patent aorta and bilateral renal arteries. Widely patent common iliac stents.  Widely patent external iliac stent on the right. The right common femoral endarterectomy site is widely patent.  The profunda is patent although there is an approximate 50% stenosis.  The SFA is patent for the first 8 to 10 cm's but has severe with a proximal stenosis of about  70%.  The SFA then occludes and the above-knee popliteal artery reconstitutes  via collaterals.  The popliteal artery appears widely patent.  The AT occludes shortly after the turn.  The PT is chronically occluded.  The peroneal is the only runoff to the foot which reconstitutes plantar branches in the DP.  Procedure:  The patient was identified in the holding area and taken to the cath lab  The patient was then placed supine on the table and prepped and draped in the usual sterile fashion.  A time out was called.  Ultrasound was used to evaluate the left common femoral artery.  It was patent .  A digital ultrasound image was acquired.  A micropuncture needle was used to access the left common femoral artery under ultrasound guidance.  An 018 wire was advanced without resistance and a micropuncture sheath was placed.  The 018 wire was removed and a benson wire was placed.  The micropuncture sheath was exchanged for a 5 french sheath.  An omniflush catheter was advanced over the wire to the level of L-1.  An abdominal angiogram was obtained.  Next, using the omniflush catheter and a glide advantage wire, the aortic bifurcation was crossed and the catheter was placed into theright external iliac artery and right runoff was obtained. This demonstrated the above findings.  Glide advantage wire was then replaced through the catheter and into the proximal SFA.  The patient was systemically heparinized and the short 5 French sheath was exchanged for a 6 [place] by 45 cm catapult sheath.  The SFA CTO was severely calcific and I first attempted to cross with the glide advantage which was unsuccessful as well as an 018 system which was unsuccessful.  After attempts with different wiring catheter combinations, I was able to cross the SFA occlusion subintimally with a stiff angled Glidewire and a quick cross catheter.  An angiogram via the catheter demonstrated true lumen reentry.  Given the severe calcific disease associated with the occlusion the 035 Glidewire was exchanged for an 014 [place] core wire  and a quick cross catheter times lesion was treated with a 5 mm shockwave E8 balloon.  All 400 pulses and 10 treatments were deployed throughout the above-knee popliteal and SFA.  An angiogram demonstrated flow lumen although there is still severe ratty disease.  Therefore elected to stent this lesion.  The 014 wire was exchanged for the glide advantage wire to switch back to an 035 system and the lesion was treated with two 7 mm x 150 mm Eluvia stents.  These were deployed from the above-knee popliteal into the proximal third of the SFA and postdilated with a 6 mm Mustang balloon.  Given the patency of the proximal SFA and disease in the send I wanted to try to hold off stenting of this area therefore I treated with balloon angioplasty with a 6 mm Mustang balloon and an angiogram demonstrated an adequate result and therefore a drug-coated balloon angioplasty was performed of this area with a 6 mm x 80 mm Ranger.  Completion angiogram demonstrated wide patency of the treated segments with less than 30% residual stenosis in most areas.  There was a short area in the mid SFA that was stented but still had an approximate 50% residual stenosis although this was treated with a high-pressure inflation with a 6 mm balloon and did not give much.  Completion angiogram demonstrated preserved runoff via the peroneal.  The long 6 French sheath was exchanged for a short 6  French sheath and a minx closure device was deployed with excellent hemostasis in the left common femoral artery. Contrast: 80 cc Sedation: 89 minutes Impression: Successful recanalization of the right SFA CTO, preserved one-vessel runoff via the peroneal. Norman GORMAN Serve MD [facility] Office: [phone]   Assessment/Plan  1. Osteomyelitis of right foot, unspecified type (HCC) (Primary) -  Status post partial fifth ray amputation of the right foot due to osteomyelitis. Dressing dry, no swelling. - Follow up with  podiatry on November 16, 2023. - dressing changes as instructed by podiatry. - Maintain 50% weight bearing on the right lower extremity. - CBC with Differential/Platelets - DG Chest 2 View  2. PAD (peripheral artery disease) (HCC) -  Peripheral arterial disease with prior revascularization. No current dizziness. - Follow up at the vascular office on November 25, 2023. - Continue Plavix  75 mg daily. - Continue aspirin  81 mg daily. - Continue atorvastatin  40 mg daily.  3. Type 2 diabetes mellitus with other specified complication, with long-term current use of insulin  (HCC) Lab Results  Component Value Date   HGBA1C 5.9 (H) 10/28/2023    -  managed with insulin  and oral agents. - Continue Tresiba  38 units daily. - Continue Januvia  50 mg daily as needed. - Continue metformin  1000 mg twice daily. - instructed to follow up with his ophthalmologist for diabetic eye exam. - Microalbumin/Creatinine Ratio, Urine  4. Primary hypertension -  managed with amlodipine , losartan , and hydrochlorothiazide . Blood pressure 128/76. - Continue amlodipine  10 mg daily. - Continue losartan  50 mg twice daily. - Continue hydrochlorothiazide  12.5 mg daily. - CMP  5. Iron  deficiency Lab Results  Component Value Date   HGB 9.9 (L) 11/03/2023    -  Continue ferrous sulfate  325 mg daily.  6. Dyslipidemia Lab Results  Component Value Date   CHOL 70 06/24/2023   HDL 24 (L) 06/24/2023   LDLCALC 31 06/24/2023   TRIG 74 06/24/2023   CHOLHDL 2.9 06/24/2023    -  managed with atorvastatin . - Continue atorvastatin  40 mg daily.  7. Gastroesophageal reflux disease without esophagitis -  managed with pantoprazole . - Continue pantoprazole  40 mg daily.  8. Moderate asthma, unspecified whether complicated, unspecified whether persistent -  Wheezing, possibly related to a cold. - Prescribe albuterol  inhaler. - Order [facility] at [facility] - albuterol  (VENTOLIN  HFA) 108 (90 Base) MCG/ACT inhaler;  Inhale 2 puffs into the lungs every 6 (six) hours as needed for wheezing or shortness of breath.  Dispense: 8 g; Refill: 0     Labs/tests ordered:   CMP, CBC, chest x-ray, urine microalbumin creatinine ratio   Return in about 3 months (around 02/14/2024), or if symptoms worsen or fail to improve.  Dayna Geurts Medina-Vargas, NP

## 2023-11-14 NOTE — Telephone Encounter (Signed)
 The patient provided verbal understanding and had no additional questions or concerns. I informed the scheduler to proceed with scheduling the iron  therapy.

## 2023-11-14 NOTE — Telephone Encounter (Signed)
-----   Message from Olam Ned sent at 11/11/2023  3:53 PM EDT ----- Please let him know we reviewed the peripheral blood smear.  Appears consistent with continued iron  deficiency.  Dr. Andriette recommendation is for a trial of IV iron .  If he agrees please schedule lab, Lisa or [facility], IV iron  in the next few weeks.  Thanks

## 2023-11-15 ENCOUNTER — Ambulatory Visit: Payer: Self-pay | Admitting: Adult Health

## 2023-11-15 LAB — COMPREHENSIVE METABOLIC PANEL WITH GFR
AG Ratio: 1.8 (calc) (ref 1.0–2.5)
ALT: 11 U/L (ref 9–46)
AST: 19 U/L (ref 10–35)
Albumin: 4.3 g/dL (ref 3.6–5.1)
Alkaline phosphatase (APISO): 76 U/L (ref 35–144)
BUN: 18 mg/dL (ref 7–25)
CO2: 27 mmol/L (ref 20–32)
Calcium: 8.7 mg/dL (ref 8.6–10.3)
Chloride: 99 mmol/L (ref 98–110)
Creat: 0.83 mg/dL (ref 0.70–1.22)
Globulin: 2.4 g/dL (ref 1.9–3.7)
Glucose, Bld: 71 mg/dL (ref 65–139)
Potassium: 4.5 mmol/L (ref 3.5–5.3)
Sodium: 139 mmol/L (ref 135–146)
Total Bilirubin: 0.6 mg/dL (ref 0.2–1.2)
Total Protein: 6.7 g/dL (ref 6.1–8.1)
eGFR: 88 mL/min/1.73m2 (ref >=60)

## 2023-11-15 LAB — CBC WITH DIFFERENTIAL/PLATELET
Absolute Lymphocytes: 1312 {cells}/uL (ref 850–3900)
Absolute Monocytes: 1248 {cells}/uL — ABNORMAL HIGH (ref 200–950)
Basophils Absolute: 80 {cells}/uL (ref 0–200)
Basophils Relative: 1 %
Eosinophils Absolute: 288 {cells}/uL (ref 15–500)
Eosinophils Relative: 3.6 %
HCT: 28.7 % — ABNORMAL LOW (ref 38.5–50.0)
Hemoglobin: 9.5 g/dL — ABNORMAL LOW (ref 13.2–17.1)
MCH: 31 pg (ref 27.0–33.0)
MCHC: 33.1 g/dL (ref 32.0–36.0)
MCV: 93.8 fL (ref 80.0–100.0)
MPV: 10.8 fL (ref 7.5–12.5)
Monocytes Relative: 15.6 %
Neutro Abs: 5072 {cells}/uL (ref 1500–7800)
Neutrophils Relative %: 63.4 %
Platelets: 297 Thousand/uL (ref 140–400)
RBC: 3.06 Million/uL — ABNORMAL LOW (ref 4.20–5.80)
RDW: 16.1 % — ABNORMAL HIGH (ref 11.0–15.0)
Total Lymphocyte: 16.4 %
WBC: 8 Thousand/uL (ref 3.8–10.8)

## 2023-11-15 LAB — MICROALBUMIN / CREATININE URINE RATIO
Creatinine, Urine: 100 mg/dL (ref 20–320)
Microalb Creat Ratio: 27 mg/g{creat} (ref <30)
Microalb, Ur: 2.7 mg/dL

## 2023-11-15 NOTE — Progress Notes (Signed)
-     Hemoglobin 9.5, stable -   Electrolytes, liver enzymes and urine microalbumin creatinine ratio, all normal

## 2023-11-16 ENCOUNTER — Encounter: Payer: Self-pay | Admitting: Podiatry

## 2023-11-16 ENCOUNTER — Ambulatory Visit (INDEPENDENT_AMBULATORY_CARE_PROVIDER_SITE_OTHER): Admitting: Podiatry

## 2023-11-16 VITALS — Ht 70.0 in | Wt 177.8 lb

## 2023-11-16 DIAGNOSIS — L97514 Non-pressure chronic ulcer of other part of right foot with necrosis of bone: Secondary | ICD-10-CM

## 2023-11-16 NOTE — Progress Notes (Signed)
 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  Inpatient.  DOS: 10/29/2023.  Doing well.  WBAT surgical shoe as instructed  Past Medical History:  Diagnosis Date   Diabetes mellitus without complication (HCC)    Dysrhythmia    chronic RBBB; bradycardia s/p atropine  following sheath removal, 3.8 second sinus pause 10/11/22   Hypertension    Peripheral arterial disease (HCC)    Peripheral artery disease (HCC)     Past Surgical History:  Procedure Laterality Date   stents in both my legs     ABDOMINAL AORTOGRAM W/LOWER EXTREMITY N/A 10/11/2022   Procedure: ABDOMINAL AORTOGRAM W/LOWER EXTREMITY;  Surgeon: Eliza Lonni RAMAN, MD;  Location: [facility] INVASIVE CV LAB;  Service: Cardiovascular;  Laterality: N/A;   ABDOMINAL AORTOGRAM W/LOWER EXTREMITY Left 06/20/2023   Procedure: ABDOMINAL AORTOGRAM W/LOWER EXTREMITY;  Surgeon: Pearline Norman RAMAN, MD;  Location: [facility] INVASIVE CV LAB;  Service: Cardiovascular;  Laterality: Left;   ABDOMINAL AORTOGRAM W/LOWER EXTREMITY N/A 10/17/2023   Procedure: ABDOMINAL AORTOGRAM W/LOWER EXTREMITY;  Surgeon: Pearline Norman RAMAN, MD;  Location: [facility] INVASIVE CV LAB;  Service: Cardiovascular;  Laterality: N/A;   AMPUTATION Right 10/29/2023   Procedure: AMPUTATION, FOOT, RAY;  Surgeon: Janit Thresa HERO, DPM;  Location: [facility] OR;  Service: Orthopedics/Podiatry;  Laterality: Right;  PARTIAL FIFTH RAY AMPUTATION   COLONOSCOPY WITH PROPOFOL  N/A 01/18/2023   Procedure: COLONOSCOPY WITH PROPOFOL ;  Surgeon: Saintclair Jasper, MD;  Location: [facility] ENDOSCOPY;  Service: Gastroenterology;  Laterality: N/A;   ENDARTERECTOMY FEMORAL Left 10/15/2022   Procedure: ENDARTERECTOMY OF LEFT ILIOFEMORAL ARTERY;  Surgeon: Eliza Lonni RAMAN, MD;  Location: [facility] OR;  Service: Vascular;  Laterality: Left;   ENDARTERECTOMY FEMORAL Right 06/23/2023    Procedure: COMMON FEMORAL ENDARTERECTOMY;  Surgeon: Pearline Norman RAMAN, MD;  Location: [facility] OR;  Service: Vascular;  Laterality: Right;   ESOPHAGOGASTRODUODENOSCOPY (EGD) WITH PROPOFOL  N/A 01/18/2023   Procedure: ESOPHAGOGASTRODUODENOSCOPY (EGD) WITH PROPOFOL ;  Surgeon: Saintclair Jasper, MD;  Location: [facility] ENDOSCOPY;  Service: Gastroenterology;  Laterality: N/A;   FEMORAL-POPLITEAL BYPASS GRAFT Left 10/15/2022   Procedure: LEFT FEMORAL-BELOW KNEE POPLITEAL ARTERY BYPASS WITH VEIN HARVEST OF LEFT GREATER SAPHENOUS VEIN;  Surgeon: Eliza Lonni RAMAN, MD;  Location: [facility] OR;  Service: Vascular;  Laterality: Left;   GIVENS CAPSULE STUDY N/A 04/10/2017   Procedure: GIVENS CAPSULE STUDY;  Surgeon: Dianna Specking, MD;  Location: [facility] ENDOSCOPY;  Service: Endoscopy;  Laterality: N/A;   GIVENS CAPSULE STUDY N/A 01/18/2023   Procedure: GIVENS CAPSULE STUDY;  Surgeon: Saintclair Jasper, MD;  Location: [facility] ENDOSCOPY;  Service: Gastroenterology;  Laterality: N/A;   INSERTION OF ILIAC STENT Right 06/23/2023   Procedure: INSERTION, STENT, ARTERY, ILIAC;  Surgeon: Pearline Norman RAMAN, MD;  Location: [facility] OR;  Service: Vascular;  Laterality: Right;   IR GENERIC HISTORICAL  08/26/2015   IR RADIOLOGIST EVAL & MGMT 08/26/2015 Wilkie Lent, MD [facility] INTERV RAD   IR GENERIC HISTORICAL  04/21/2016   IR RADIOLOGIST EVAL & MGMT 04/21/2016 Wilkie Lent, MD [facility] INTERV RAD   LOWER EXTREMITY ANGIOGRAM Right 06/23/2023   Procedure: GERALYN, LOWER EXTREMITY;  Surgeon: Pearline Norman RAMAN, MD;  Location: [facility] OR;  Service: Vascular;  Laterality: Right;   LOWER EXTREMITY INTERVENTION Left 06/20/2023   Procedure: LOWER EXTREMITY INTERVENTION;  Surgeon: Pearline Norman RAMAN, MD;  Location: [facility] INVASIVE CV LAB;  Service: Cardiovascular;  Laterality: Left;  DCB   LOWER EXTREMITY INTERVENTION N/A 10/17/2023   Procedure: LOWER EXTREMITY INTERVENTION;  Surgeon: Pearline Norman RAMAN, MD;  Location: [facility] INVASIVE CV LAB;  Service: Cardiovascular;  Laterality: N/A;    PATCH ANGIOPLASTY Left 10/15/2022   Procedure: PATCH ANGIOPLASTY USING XENOSURE BIOLOGIC PATCH 1CMX14CM;  Surgeon: Eliza Lonni RAMAN, MD;  Location: [facility] OR;  Service: Vascular;  Laterality: Left;   PATCH ANGIOPLASTY Right 06/23/2023   Procedure: ANGIOPLASTY, USING PATCH GRAFT 6CM GEORGE;  Surgeon: Pearline Norman RAMAN, MD;  Location: [facility] OR;  Service: Vascular;  Laterality: Right;   PERIPHERAL VASCULAR INTERVENTION  10/11/2022   Procedure: PERIPHERAL VASCULAR INTERVENTION;  Surgeon: Eliza Lonni RAMAN, MD;  Location: [facility] INVASIVE CV LAB;  Service: Cardiovascular;;    No Known Allergies  Objective/Physical Exam Neurovascular status intact.  Incision well coapted with staples intact. No sign of infectious process noted. No dehiscence. No active bleeding noted.    Radiographic Exam RT foot 11/07/2023:  Partial fifth ray amputation right foot with clean osteotomy at the base of the fifth metatarsal.  No erosions concerning for osteomyelitis.  Staples noted along the lateral aspect of the foot  Assessment: 1. 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 DOS: 10/29/2023   Plan of Care:  -Patient was evaluated.  -Dressings changed.  Leave clean dry and intact x 1 week -Continue WBAT surgical shoe -Patient has completed oral antibiotics at discharge.  Clinically no need for refill -Return to clinic 1 week for staple removal   Thresa EMERSON Sar, DPM [facility]  Dr. Thresa EMERSON Sar, DPM    [address] [place], [place] [address]                Office [phone]  Fax [phone]

## 2023-11-17 ENCOUNTER — Telehealth: Payer: Self-pay | Admitting: *Deleted

## 2023-11-17 NOTE — Telephone Encounter (Signed)
 Randy Jacobson called very upset that no one had called him yet to set up his iron  infusion. Informed him that I will have the scheduler call him today. Sent secure chat asking to call him today please.

## 2023-11-22 ENCOUNTER — Telehealth: Payer: Self-pay | Admitting: Nurse Practitioner

## 2023-11-22 NOTE — Telephone Encounter (Signed)
 CONFIRMING NEXT APPT.

## 2023-11-22 NOTE — Progress Notes (Signed)
-    chest x-ray negative for acute issues.

## 2023-11-23 ENCOUNTER — Encounter: Payer: Self-pay | Admitting: Podiatry

## 2023-11-23 ENCOUNTER — Ambulatory Visit (INDEPENDENT_AMBULATORY_CARE_PROVIDER_SITE_OTHER): Admitting: Podiatry

## 2023-11-23 VITALS — Ht 70.0 in | Wt 177.8 lb

## 2023-11-23 DIAGNOSIS — L97514 Non-pressure chronic ulcer of other part of right foot with necrosis of bone: Secondary | ICD-10-CM

## 2023-11-23 NOTE — Progress Notes (Deleted)
 HISTORY AND PHYSICAL     CC:  follow up. Requesting Provider:  Phyllis Jereld BROCKS*  HPI: This is a 78 y.o. male who is here today for follow up for PAD.  Pt has hx of left iliofemoral endarterectomy with pericardial patch angioplasty, left femoral to below knee popliteal bypass with non reversed translocated vein for gangrenous left great toe on 10/15/2022 by Dr. Eliza.   On 06/20/2023, he underwent angiogram with drug coated balloon angioplasty of left femoral to popliteal bypass by Dr. Pearline for threatened bypass.  On 06/24/2023 he underwent right  CFA endarterectomy with profundoplasty, aortogram with right EIA stenting  on 06/24/2023 by Dr. Pearline.  On 10/17/2023 he underwent angiogram with intravascular lithotripsy of right SFA and popliteal arteries, stenting of right SFA and popliteal arteries, drug coated balloon angioplasty of proximal right SFA by Dr. Pearline for CLTI of RLE with 5th toe wound.  The pt returns today for follow up.  ***  The pt is on a statin for cholesterol management.    The pt is on an aspirin .    Other AC:  Plavix  The pt is on CCB, diuretic, ARB for hypertension.  The pt is  on diabetic medication. Tobacco hx:  former  Pt does *** have family hx of AAA.  Past Medical History:  Diagnosis Date   Diabetes mellitus without complication (HCC)    Dysrhythmia    chronic RBBB; bradycardia s/p atropine  following sheath removal, 3.8 second sinus pause 10/11/22   Hypertension    Peripheral arterial disease (HCC)    Peripheral artery disease (HCC)     Past Surgical History:  Procedure Laterality Date   stents in both my legs     ABDOMINAL AORTOGRAM W/LOWER EXTREMITY N/A 10/11/2022   Procedure: ABDOMINAL AORTOGRAM W/LOWER EXTREMITY;  Surgeon: Eliza Lonni RAMAN, MD;  Location: [facility] INVASIVE CV LAB;  Service: Cardiovascular;  Laterality: N/A;   ABDOMINAL AORTOGRAM W/LOWER EXTREMITY Left 06/20/2023   Procedure: ABDOMINAL AORTOGRAM W/LOWER EXTREMITY;  Surgeon:  Pearline Norman RAMAN, MD;  Location: [facility] INVASIVE CV LAB;  Service: Cardiovascular;  Laterality: Left;   ABDOMINAL AORTOGRAM W/LOWER EXTREMITY N/A 10/17/2023   Procedure: ABDOMINAL AORTOGRAM W/LOWER EXTREMITY;  Surgeon: Pearline Norman RAMAN, MD;  Location: [facility] INVASIVE CV LAB;  Service: Cardiovascular;  Laterality: N/A;   AMPUTATION Right 10/29/2023   Procedure: AMPUTATION, FOOT, RAY;  Surgeon: Janit Thresa HERO, DPM;  Location[facility] OR;  Service: Orthopedics/Podiatry;  Laterality: Right;  PARTIAL FIFTH RAY AMPUTATION   COLONOSCOPY WITH PROPOFOL  N/A 01/18/2023   Procedure: COLONOSCOPY WITH PROPOFOL ;  Surgeon: Saintclair Jasper, MD;  Location: [facility] ENDOSCOPY;  Service: Gastroenterology;  Laterality: N/A;   ENDARTERECTOMY FEMORAL Left 10/15/2022   Procedure: ENDARTERECTOMY OF LEFT ILIOFEMORAL ARTERY;  Surgeon: Eliza Lonni RAMAN, MD;  Location: [facility] OR;  Service: Vascular;  Laterality: Left;   ENDARTERECTOMY FEMORAL Right 06/23/2023   Procedure: COMMON FEMORAL ENDARTERECTOMY;  Surgeon: Pearline Norman RAMAN, MD;  Location: [facility] OR;  Service: Vascular;  Laterality: Right;   ESOPHAGOGASTRODUODENOSCOPY (EGD) WITH PROPOFOL  N/A 01/18/2023   Procedure: ESOPHAGOGASTRODUODENOSCOPY (EGD) WITH PROPOFOL ;  Surgeon: Saintclair Jasper, MD;  Location: [facility] ENDOSCOPY;  Service: Gastroenterology;  Laterality: N/A;   FEMORAL-POPLITEAL BYPASS GRAFT Left 10/15/2022   Procedure: LEFT FEMORAL-BELOW KNEE POPLITEAL ARTERY BYPASS WITH VEIN HARVEST OF LEFT GREATER SAPHENOUS VEIN;  Surgeon: Eliza Lonni RAMAN, MD;  Location: [facility] OR;  Service: Vascular;  Laterality: Left;   GIVENS CAPSULE STUDY N/A 04/10/2017   Procedure: GIVENS CAPSULE STUDY;  Surgeon: Dianna Specking, MD;  Location:  [facility] ENDOSCOPY;  Service: Endoscopy;  Laterality: N/A;   GIVENS CAPSULE STUDY N/A 01/18/2023   Procedure: GIVENS CAPSULE STUDY;  Surgeon: Saintclair Jasper, MD;  Location: [facility] ENDOSCOPY;  Service: Gastroenterology;  Laterality: N/A;   INSERTION OF ILIAC STENT Right 06/23/2023   Procedure:  INSERTION, STENT, ARTERY, ILIAC;  Surgeon: Pearline Norman RAMAN, MD;  Location: [facility] OR;  Service: Vascular;  Laterality: Right;   IR GENERIC HISTORICAL  08/26/2015   IR RADIOLOGIST EVAL & MGMT 08/26/2015 Wilkie Lent, MD [facility] INTERV RAD   IR GENERIC HISTORICAL  04/21/2016   IR RADIOLOGIST EVAL & MGMT 04/21/2016 Wilkie Lent, MD [facility] INTERV RAD   LOWER EXTREMITY ANGIOGRAM Right 06/23/2023   Procedure: GERALYN, LOWER EXTREMITY;  Surgeon: Pearline Norman RAMAN, MD;  Location: [facility] OR;  Service: Vascular;  Laterality: Right;   LOWER EXTREMITY INTERVENTION Left 06/20/2023   Procedure: LOWER EXTREMITY INTERVENTION;  Surgeon: Pearline Norman RAMAN, MD;  Location: [facility] INVASIVE CV LAB;  Service: Cardiovascular;  Laterality: Left;  DCB   LOWER EXTREMITY INTERVENTION N/A 10/17/2023   Procedure: LOWER EXTREMITY INTERVENTION;  Surgeon: Pearline Norman RAMAN, MD;  Location: [facility] INVASIVE CV LAB;  Service: Cardiovascular;  Laterality: N/A;   PATCH ANGIOPLASTY Left 10/15/2022   Procedure: PATCH ANGIOPLASTY USING XENOSURE BIOLOGIC PATCH 1CMX14CM;  Surgeon: Eliza Lonni RAMAN, MD;  Location: [facility] OR;  Service: Vascular;  Laterality: Left;   PATCH ANGIOPLASTY Right 06/23/2023   Procedure: ANGIOPLASTY, USING PATCH GRAFT 6CM GEORGE;  Surgeon: Pearline Norman RAMAN, MD;  Location: [facility] OR;  Service: Vascular;  Laterality: Right;   PERIPHERAL VASCULAR INTERVENTION  10/11/2022   Procedure: PERIPHERAL VASCULAR INTERVENTION;  Surgeon: Eliza Lonni RAMAN, MD;  Location: [facility] INVASIVE CV LAB;  Service: Cardiovascular;;    No Known Allergies  Current Outpatient Medications  Medication Sig Dispense Refill   acetaminophen  (TYLENOL ) 500 MG tablet Take 1 tablet (500 mg total) by mouth every 8 (eight) hours as needed for mild pain (pain score 1-3) or fever. 20 tablet 0   albuterol  (VENTOLIN  HFA) 108 (90 Base) MCG/ACT inhaler Inhale 2 puffs into the lungs every 6 (six) hours as needed for wheezing or shortness of breath. 8 g 0   amLODipine  (NORVASC )  10 MG tablet Take 1 tablet (10 mg total) by mouth daily. 30 tablet 0   Ascorbic Acid (VITAMIN C ) 1000 MG tablet Take 1 tablet (1,000 mg total) by mouth daily.     aspirin  EC 81 MG tablet Take 1 tablet (81 mg total) by mouth daily at 6 (six) AM. Swallow whole. 30 tablet 12   atorvastatin  (LIPITOR) 40 MG tablet Take 1 tablet by mouth once daily 90 tablet 1   clopidogrel  (PLAVIX ) 75 MG tablet Take 1 tablet (75 mg total) by mouth daily. 90 tablet 1   Continuous Glucose Sensor (FREESTYLE LIBRE 2 SENSOR) MISC 2 Devices by Does not apply route daily. E11.69 2 each 11   ferrous sulfate  325 (65 FE) MG tablet Take 1 tablet (325 mg total) by mouth daily with breakfast. 90 tablet 1   gabapentin  (NEURONTIN ) 100 MG capsule Take 1 capsule (100 mg total) by mouth 3 (three) times daily. 90 capsule 3   hydrochlorothiazide  (HYDRODIURIL ) 25 MG tablet Take 0.5 tablets (12.5 mg total) by mouth daily. 45 tablet 0   losartan  (COZAAR ) 50 MG tablet Take 1 tablet (50 mg total) by mouth in the morning and at bedtime. 180 tablet 1   Melatonin 10 MG TABS Take 10 mg by mouth at bedtime.  metFORMIN  (GLUCOPHAGE ) 1000 MG tablet TAKE 1 TABLET BY MOUTH TWICE DAILY WITH A MEAL 180 tablet 1   pantoprazole  (PROTONIX ) 40 MG tablet Take 1 tablet (40 mg total) by mouth daily. 90 tablet 1   silver sulfADIAZINE (SILVADENE) 1 % cream Apply 1 Application topically daily.     sitaGLIPtin  (JANUVIA ) 50 MG tablet Take 1 tablet (50 mg total) by mouth daily as needed (for high blood sugar). 90 tablet 1   TRESIBA  FLEXTOUCH 100 UNIT/ML FlexTouch Pen Inject 38 Units into the skin daily. 15 mL 5   No current facility-administered medications for this visit.    Family History  Problem Relation Age of Onset   Diabetes Brother     Social History   Socioeconomic History   Marital status: Significant Other    Spouse name: Not on file   Number of children: Not on file   Years of education: Not on file   Highest education level: Not on file   Occupational History   Not on file  Tobacco Use   Smoking status: Former    Current packs/day: 0.00    Average packs/day: 1 pack/day for 36.0 years (36.0 ttl pk-yrs)    Types: Cigarettes    Start date: 11/20/1957    Quit date: 11/20/1993    Years since quitting: 30.0   Smokeless tobacco: Never  Vaping Use   Vaping status: Never Used  Substance and Sexual Activity   Alcohol use: Yes    Alcohol/week: 0.0 standard drinks of alcohol    Comment: occ   Drug use: Never   Sexual activity: Not on file  Other Topics Concern   Not on file  Social History Narrative   Not on file   Social Drivers of Health   Financial Resource Strain: Low Risk  (01/29/2022)   Received from [facility]   Overall Financial Resource Strain (CARDIA)    Difficulty of Paying Living Expenses: Not hard at all  Food Insecurity: No Food Insecurity (11/01/2023)   Hunger Vital Sign    Worried About Running Out of Food in the Last Year: Never true    Ran Out of Food in the Last Year: Never true  Transportation Needs: No Transportation Needs (11/01/2023)   PRAPARE - Administrator, Civil Service (Medical): No    Lack of Transportation (Non-Medical): No  Physical Activity: Not on file  Stress: No Stress Concern Present (01/29/2022)   Received from [facility] - Occupational Stress Questionnaire    Feeling of Stress : Not at all  Social Connections: Moderately Isolated (10/30/2023)   Social Connection and Isolation Panel    Frequency of Communication with Friends and Family: Twice a week    Frequency of Social Gatherings with Friends and Family: Once a week    Attends Religious Services: Never    Database administrator or Organizations: No    Attends Banker Meetings: Never    Marital Status: Living with partner  Intimate Partner Violence: Not At Risk (11/01/2023)   Humiliation, Afraid, Rape, and Kick questionnaire    Fear of Current or  Ex-Partner: No    Emotionally Abused: No    Physically Abused: No    Sexually Abused: No     REVIEW OF SYSTEMS:  *** [X]  denotes positive finding, [ ]  denotes negative finding Cardiac  Comments:  Chest pain or chest pressure:    Shortness of breath upon exertion:    Short  of breath when lying flat:    Irregular heart rhythm:        Vascular    Pain in calf, thigh, or hip brought on by ambulation:    Pain in feet at night that wakes you up from your sleep:     Blood clot in your veins:    Leg swelling:         Pulmonary    Oxygen at home:    Productive cough:     Wheezing:         Neurologic    Sudden weakness in arms or legs:     Sudden numbness in arms or legs:     Sudden onset of difficulty speaking or slurred speech:    Temporary loss of vision in one eye:     Problems with dizziness:         Gastrointestinal    Blood in stool:     Vomited blood:         Genitourinary    Burning when urinating:     Blood in urine:        Psychiatric    Major depression:         Hematologic    Bleeding problems:    Problems with blood clotting too easily:        Skin    Rashes or ulcers:        Constitutional    Fever or chills:      PHYSICAL EXAMINATION:  ***  General:  WDWN in NAD; vital signs documented above Gait: Not observed HENT: WNL, normocephalic Pulmonary: normal non-labored breathing , without wheezing Cardiac: {Desc; regular/irreg:14544} HR, {With/Without:20273} carotid bruit*** Abdomen: soft, NT; aortic pulse is *** palpable Skin: {With/Without:20273} rashes Vascular Exam/Pulses:  Right Left  Radial {Exam; arterial pulse strength 0-4:30167} {Exam; arterial pulse strength 0-4:30167}  Femoral {Exam; arterial pulse strength 0-4:30167} {Exam; arterial pulse strength 0-4:30167}  Popliteal {Exam; arterial pulse strength 0-4:30167} {Exam; arterial pulse strength 0-4:30167}  DP {Exam; arterial pulse strength 0-4:30167} {Exam; arterial pulse strength  0-4:30167}  PT {Exam; arterial pulse strength 0-4:30167} {Exam; arterial pulse strength 0-4:30167}  Peroneal *** ***   Extremities: {With/Without:20273} ischemic changes, {With/Without:20273} Gangrene , {With/Without:20273} cellulitis; {With/Without:20273} open wounds Musculoskeletal: no muscle wasting or atrophy  Neurologic: A&O X 3 Psychiatric:  The pt has {Desc; normal/abnormal:11317::Normal} affect.   Non-Invasive Vascular Imaging:   ABI's/TBI's on 11/25/2023: Right:  *** - Great toe pressure: *** Left:  *** - Great toe pressure: ***  Arterial duplex on 11/25/2023: ***  Previous ABI's/TBI's on 10/07/2023: Right:  0.57/absent - Great toe pressure: absent Left:  1.41/absent - Great toe pressure:  absent  Previous arterial duplex on 10/07/2023: +--------+--------+-----+--------+---------+--------+  LEFT   PSV cm/sRatioStenosisWaveform Comments  +--------+--------+-----+--------+---------+--------+  CFA [id number]                  triphasic          +--------+--------+-----+--------+---------+--------+  TP [id number]                   triphasic          +--------+--------+-----+--------+---------+--------+     Left Graft #1: CFA to BK Pop  +--------------------+--------+--------+---------+--------+                     PSV cm/sStenosisWaveform Comments  +--------------------+--------+--------+---------+--------+  Inflow             105  triphasic          +--------------------+--------+--------+---------+--------+  Proximal Anastomosis110             biphasic           +--------------------+--------+--------+---------+--------+  Proximal Graft      104             biphasic           +--------------------+--------+--------+---------+--------+  Mid Graft           106             biphasic           +--------------------+--------+--------+---------+--------+  Distal Graft        96              biphasic            +--------------------+--------+--------+---------+--------+  Distal Anastomosis  144             biphasic           +--------------------+--------+--------+---------+--------+  Outflow            87              triphasic          +--------------------+--------+--------+---------+--------+   Summary:  Left: Patent left fem-pop bypass graft without stenosis     ASSESSMENT/PLAN:: 78 y.o. male here for follow up for PAD with hx of left iliofemoral endarterectomy with pericardial patch angioplasty, left femoral to below knee popliteal bypass with non reversed translocated vein for gangrenous left great toe on 10/15/2022 by Dr. Eliza.   On 06/20/2023, he underwent angiogram with drug coated balloon angioplasty of left femoral to popliteal bypass by Dr. Pearline for threatened bypass.  On 06/24/2023 he underwent right  CFA endarterectomy with profundoplasty, aortogram with right EIA stenting  on 06/24/2023 by Dr. Pearline.  On 10/17/2023 he underwent angiogram with intravascular lithotripsy of right SFA and popliteal arteries, stenting of right SFA and popliteal arteries, drug coated balloon angioplasty of proximal right SFA by Dr. Pearline for CLTI of RLE with 5th toe wound.   -*** -continue asa/statin/plavix  -discussed importance of increased walking daily -pt will f/u in *** with ***.   Lucie Apt, [facility] [phone]  Clinic MD:   Sheree on call MD

## 2023-11-23 NOTE — Progress Notes (Signed)
 Chief Complaint  Patient presents with   Routine Post Op    POV #3 DOS: 10/29/2023- AMPUTATION, FOOT, RAY (Right), pt states everything is going good, no pain or complaints.    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  Inpatient.  DOS: 10/29/2023.  Doing well.  WBAT surgical shoe as instructed  Past Medical History:  Diagnosis Date   Diabetes mellitus without complication (HCC)    Dysrhythmia    chronic RBBB; bradycardia s/p atropine  following sheath removal, 3.8 second sinus pause 10/11/22   Hypertension    Peripheral arterial disease (HCC)    Peripheral artery disease (HCC)     Past Surgical History:  Procedure Laterality Date   stents in both my legs     ABDOMINAL AORTOGRAM W/LOWER EXTREMITY N/A 10/11/2022   Procedure: ABDOMINAL AORTOGRAM W/LOWER EXTREMITY;  Surgeon: Eliza Lonni RAMAN, MD;  Location: [facility] INVASIVE CV LAB;  Service: Cardiovascular;  Laterality: N/A;   ABDOMINAL AORTOGRAM W/LOWER EXTREMITY Left 06/20/2023   Procedure: ABDOMINAL AORTOGRAM W/LOWER EXTREMITY;  Surgeon: Pearline Norman RAMAN, MD;  Location: [facility] INVASIVE CV LAB;  Service: Cardiovascular;  Laterality: Left;   ABDOMINAL AORTOGRAM W/LOWER EXTREMITY N/A 10/17/2023   Procedure: ABDOMINAL AORTOGRAM W/LOWER EXTREMITY;  Surgeon: Pearline Norman RAMAN, MD;  Location: [facility] INVASIVE CV LAB;  Service: Cardiovascular;  Laterality: N/A;   AMPUTATION Right 10/29/2023   Procedure: AMPUTATION, FOOT, RAY;  Surgeon: Janit Thresa HERO, DPM;  Location: [facility] OR;  Service: Orthopedics/Podiatry;  Laterality: Right;  PARTIAL FIFTH RAY AMPUTATION   COLONOSCOPY WITH PROPOFOL  N/A 01/18/2023   Procedure: COLONOSCOPY WITH PROPOFOL ;  Surgeon: Saintclair Jasper, MD;  Location: [facility] ENDOSCOPY;  Service: Gastroenterology;  Laterality: N/A;   ENDARTERECTOMY FEMORAL Left 10/15/2022   Procedure: ENDARTERECTOMY OF LEFT ILIOFEMORAL ARTERY;  Surgeon: Eliza Lonni RAMAN, MD;  Location: [facility] OR;  Service: Vascular;   Laterality: Left;   ENDARTERECTOMY FEMORAL Right 06/23/2023   Procedure: COMMON FEMORAL ENDARTERECTOMY;  Surgeon: Pearline Norman RAMAN, MD;  Location: [facility] OR;  Service: Vascular;  Laterality: Right;   ESOPHAGOGASTRODUODENOSCOPY (EGD) WITH PROPOFOL  N/A 01/18/2023   Procedure: ESOPHAGOGASTRODUODENOSCOPY (EGD) WITH PROPOFOL ;  Surgeon: Saintclair Jasper, MD;  Location: [facility] ENDOSCOPY;  Service: Gastroenterology;  Laterality: N/A;   FEMORAL-POPLITEAL BYPASS GRAFT Left 10/15/2022   Procedure: LEFT FEMORAL-BELOW KNEE POPLITEAL ARTERY BYPASS WITH VEIN HARVEST OF LEFT GREATER SAPHENOUS VEIN;  Surgeon: Eliza Lonni RAMAN, MD;  Location: [facility] OR;  Service: Vascular;  Laterality: Left;   GIVENS CAPSULE STUDY N/A 04/10/2017   Procedure: GIVENS CAPSULE STUDY;  Surgeon: Dianna Specking, MD;  Location: [facility] ENDOSCOPY;  Service: Endoscopy;  Laterality: N/A;   GIVENS CAPSULE STUDY N/A 01/18/2023   Procedure: GIVENS CAPSULE STUDY;  Surgeon: Saintclair Jasper, MD;  Location: [facility] ENDOSCOPY;  Service: Gastroenterology;  Laterality: N/A;   INSERTION OF ILIAC STENT Right 06/23/2023   Procedure: INSERTION, STENT, ARTERY, ILIAC;  Surgeon: Pearline Norman RAMAN, MD;  Location: [facility] OR;  Service: Vascular;  Laterality: Right;   IR GENERIC HISTORICAL  08/26/2015   IR RADIOLOGIST EVAL & MGMT 08/26/2015 Wilkie Lent, MD [facility] INTERV RAD   IR GENERIC HISTORICAL  04/21/2016   IR RADIOLOGIST EVAL & MGMT 04/21/2016 Wilkie Lent, MD [facility] INTERV RAD   LOWER EXTREMITY ANGIOGRAM Right 06/23/2023   Procedure: GERALYN, LOWER EXTREMITY;  Surgeon: Pearline Norman RAMAN, MD;  Location: [facility] OR;  Service: Vascular;  Laterality: Right;   LOWER EXTREMITY INTERVENTION Left 06/20/2023   Procedure: LOWER EXTREMITY INTERVENTION;  Surgeon: Pearline Norman RAMAN,  MD;  Location: [facility] INVASIVE CV LAB;  Service: Cardiovascular;  Laterality: Left;  DCB   LOWER EXTREMITY INTERVENTION N/A 10/17/2023   Procedure: LOWER EXTREMITY INTERVENTION;  Surgeon: Pearline Norman RAMAN, MD;  Location: [facility]  INVASIVE CV LAB;  Service: Cardiovascular;  Laterality: N/A;   PATCH ANGIOPLASTY Left 10/15/2022   Procedure: PATCH ANGIOPLASTY USING XENOSURE BIOLOGIC PATCH 1CMX14CM;  Surgeon: Eliza Lonni RAMAN, MD;  Location: [facility] OR;  Service: Vascular;  Laterality: Left;   PATCH ANGIOPLASTY Right 06/23/2023   Procedure: ANGIOPLASTY, USING PATCH GRAFT 6CM GEORGE;  Surgeon: Pearline Norman RAMAN, MD;  Location: [facility] OR;  Service: Vascular;  Laterality: Right;   PERIPHERAL VASCULAR INTERVENTION  10/11/2022   Procedure: PERIPHERAL VASCULAR INTERVENTION;  Surgeon: Eliza Lonni RAMAN, MD;  Location: [facility] INVASIVE CV LAB;  Service: Cardiovascular;;    No Known Allergies  Objective/Physical Exam Neurovascular status intact.  Incision well coapted with staples intact. No sign of infectious process noted. No dehiscence. No active bleeding noted.    Radiographic Exam RT foot 11/07/2023:  Partial fifth ray amputation right foot with clean osteotomy at the base of the fifth metatarsal.  No erosions concerning for osteomyelitis.  Staples noted along the lateral aspect of the foot  Assessment: 1. 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 DOS: 10/29/2023   Plan of Care:  -Patient was evaluated.  -staples removed -Silvadene cream and a light dressing daily.  Nonadherent gauze and Ace wrap provided.  He has Silvadene cream at home -Continue WBAT surgical shoe -Return to clinic 2 weeks follow-up x-ray   Thresa EMERSON Sar, DPM [facility]  Dr. Thresa EMERSON Sar, DPM    [address] [place], [place] [address]                Office [phone]  Fax [phone]

## 2023-11-24 ENCOUNTER — Emergency Department (HOSPITAL_BASED_OUTPATIENT_CLINIC_OR_DEPARTMENT_OTHER)

## 2023-11-24 ENCOUNTER — Other Ambulatory Visit: Payer: Self-pay

## 2023-11-24 ENCOUNTER — Emergency Department (HOSPITAL_BASED_OUTPATIENT_CLINIC_OR_DEPARTMENT_OTHER)
Admission: EM | Admit: 2023-11-24 | Discharge: 2023-11-24 | Disposition: A | Discharge status: Home / Self Care | Source: Ambulatory Visit | Attending: Emergency Medicine | Admitting: Emergency Medicine

## 2023-11-24 ENCOUNTER — Encounter (HOSPITAL_BASED_OUTPATIENT_CLINIC_OR_DEPARTMENT_OTHER): Payer: Self-pay | Admitting: Emergency Medicine

## 2023-11-24 ENCOUNTER — Inpatient Hospital Stay: Attending: Oncology

## 2023-11-24 ENCOUNTER — Inpatient Hospital Stay: Admitting: Nurse Practitioner

## 2023-11-24 ENCOUNTER — Inpatient Hospital Stay

## 2023-11-24 ENCOUNTER — Encounter: Payer: Self-pay | Admitting: Nurse Practitioner

## 2023-11-24 VITALS — BP 122/63 | HR 87 | Temp 98.1°F | Resp 18 | Ht 70.0 in | Wt 177.1 lb

## 2023-11-24 VITALS — BP 82/60 | HR 57

## 2023-11-24 DIAGNOSIS — R55 Syncope and collapse: Secondary | ICD-10-CM | POA: Insufficient documentation

## 2023-11-24 DIAGNOSIS — I6529 Occlusion and stenosis of unspecified carotid artery: Secondary | ICD-10-CM | POA: Insufficient documentation

## 2023-11-24 DIAGNOSIS — R195 Other fecal abnormalities: Secondary | ICD-10-CM | POA: Insufficient documentation

## 2023-11-24 DIAGNOSIS — D649 Anemia, unspecified: Secondary | ICD-10-CM

## 2023-11-24 DIAGNOSIS — D509 Iron deficiency anemia, unspecified: Secondary | ICD-10-CM | POA: Insufficient documentation

## 2023-11-24 DIAGNOSIS — K259 Gastric ulcer, unspecified as acute or chronic, without hemorrhage or perforation: Secondary | ICD-10-CM | POA: Insufficient documentation

## 2023-11-24 DIAGNOSIS — I959 Hypotension, unspecified: Secondary | ICD-10-CM | POA: Diagnosis not present

## 2023-11-24 DIAGNOSIS — Z7982 Long term (current) use of aspirin: Secondary | ICD-10-CM | POA: Diagnosis not present

## 2023-11-24 DIAGNOSIS — Z7902 Long term (current) use of antithrombotics/antiplatelets: Secondary | ICD-10-CM | POA: Insufficient documentation

## 2023-11-24 DIAGNOSIS — I4510 Unspecified right bundle-branch block: Secondary | ICD-10-CM | POA: Insufficient documentation

## 2023-11-24 DIAGNOSIS — I951 Orthostatic hypotension: Secondary | ICD-10-CM

## 2023-11-24 DIAGNOSIS — E1151 Type 2 diabetes mellitus with diabetic peripheral angiopathy without gangrene: Secondary | ICD-10-CM | POA: Insufficient documentation

## 2023-11-24 DIAGNOSIS — E1142 Type 2 diabetes mellitus with diabetic polyneuropathy: Secondary | ICD-10-CM | POA: Insufficient documentation

## 2023-11-24 DIAGNOSIS — R159 Full incontinence of feces: Secondary | ICD-10-CM | POA: Insufficient documentation

## 2023-11-24 DIAGNOSIS — I10 Essential (primary) hypertension: Secondary | ICD-10-CM | POA: Insufficient documentation

## 2023-11-24 LAB — CBC WITH DIFFERENTIAL (CANCER CENTER ONLY)
Abs Immature Granulocytes: 0.07 K/uL (ref 0.00–0.07)
Basophils Absolute: 0.1 K/uL (ref 0.0–0.1)
Basophils Relative: 1 %
Eosinophils Absolute: 0.4 K/uL (ref 0.0–0.5)
Eosinophils Relative: 5 %
HCT: 33.2 % — ABNORMAL LOW (ref 39.0–52.0)
Hemoglobin: 10.5 g/dL — ABNORMAL LOW (ref 13.0–17.0)
Immature Granulocytes: 1 %
Lymphocytes Relative: 16 %
Lymphs Abs: 1.3 K/uL (ref 0.7–4.0)
MCH: 30.3 pg (ref 26.0–34.0)
MCHC: 31.6 g/dL (ref 30.0–36.0)
MCV: 95.7 fL (ref 80.0–100.0)
Monocytes Absolute: 1.2 K/uL — ABNORMAL HIGH (ref 0.1–1.0)
Monocytes Relative: 15 %
Neutro Abs: 4.8 K/uL (ref 1.7–7.7)
Neutrophils Relative %: 62 %
Platelet Count: 274 K/uL (ref 150–400)
RBC: 3.47 MIL/uL — ABNORMAL LOW (ref 4.22–5.81)
RDW: 17.1 % — ABNORMAL HIGH (ref 11.5–15.5)
WBC Count: 7.7 K/uL (ref 4.0–10.5)
nRBC: 0 % (ref 0.0–0.2)

## 2023-11-24 LAB — CBC WITH DIFFERENTIAL/PLATELET
Abs Immature Granulocytes: 0.13 K/uL — ABNORMAL HIGH (ref 0.00–0.07)
Basophils Absolute: 0.1 K/uL (ref 0.0–0.1)
Basophils Relative: 1 %
Eosinophils Absolute: 0.2 K/uL (ref 0.0–0.5)
Eosinophils Relative: 1 %
HCT: 30.7 % — ABNORMAL LOW (ref 39.0–52.0)
Hemoglobin: 9.9 g/dL — ABNORMAL LOW (ref 13.0–17.0)
Immature Granulocytes: 1 %
Lymphocytes Relative: 7 %
Lymphs Abs: 0.9 K/uL (ref 0.7–4.0)
MCH: 31.3 pg (ref 26.0–34.0)
MCHC: 32.2 g/dL (ref 30.0–36.0)
MCV: 97.2 fL (ref 80.0–100.0)
Monocytes Absolute: 1.3 K/uL — ABNORMAL HIGH (ref 0.1–1.0)
Monocytes Relative: 9 %
Neutro Abs: 11.4 K/uL — ABNORMAL HIGH (ref 1.7–7.7)
Neutrophils Relative %: 81 %
Platelets: 254 K/uL (ref 150–400)
RBC: 3.16 MIL/uL — ABNORMAL LOW (ref 4.22–5.81)
RDW: 16.9 % — ABNORMAL HIGH (ref 11.5–15.5)
WBC: 14.1 K/uL — ABNORMAL HIGH (ref 4.0–10.5)
nRBC: 0 % (ref 0.0–0.2)

## 2023-11-24 LAB — COMPREHENSIVE METABOLIC PANEL WITH GFR
ALT: 12 U/L (ref 0–44)
AST: 19 U/L (ref 15–41)
Albumin: 4.3 g/dL (ref 3.5–5.0)
Alkaline Phosphatase: 86 U/L (ref 38–126)
Anion gap: 13 (ref 5–15)
BUN: 17 mg/dL (ref 8–23)
CO2: 22 mmol/L (ref 22–32)
Calcium: 8.7 mg/dL — ABNORMAL LOW (ref 8.9–10.3)
Chloride: 105 mmol/L (ref 98–111)
Creatinine, Ser: 0.77 mg/dL (ref 0.61–1.24)
GFR, Estimated: >60 mL/min (ref >60)
Glucose, Bld: 137 mg/dL — ABNORMAL HIGH (ref 70–99)
Potassium: 4.4 mmol/L (ref 3.5–5.1)
Sodium: 140 mmol/L (ref 135–145)
Total Bilirubin: 0.4 mg/dL (ref 0.0–1.2)
Total Protein: 7 g/dL (ref 6.5–8.1)

## 2023-11-24 LAB — URINALYSIS, ROUTINE W REFLEX MICROSCOPIC
Bilirubin Urine: NEGATIVE
Glucose, UA: NEGATIVE mg/dL
Hgb urine dipstick: NEGATIVE
Ketones, ur: NEGATIVE mg/dL
Leukocytes,Ua: NEGATIVE
Nitrite: NEGATIVE
Protein, ur: NEGATIVE mg/dL
Specific Gravity, Urine: 1.019 (ref 1.005–1.030)
pH: 5.5 (ref 5.0–8.0)

## 2023-11-24 LAB — C DIFFICILE QUICK SCREEN W PCR REFLEX
C Diff antigen: NEGATIVE
C Diff interpretation: NOT DETECTED
C Diff toxin: NEGATIVE

## 2023-11-24 LAB — RESP PANEL BY RT-PCR (RSV, FLU A&B, COVID)  RVPGX2
Influenza A by PCR: NEGATIVE
Influenza B by PCR: NEGATIVE
Resp Syncytial Virus by PCR: NEGATIVE
SARS Coronavirus 2 by RT PCR: NEGATIVE

## 2023-11-24 LAB — LACTIC ACID, PLASMA: Lactic Acid, Venous: 2 mmol/L (ref 0.5–1.9)

## 2023-11-24 LAB — TROPONIN T, HIGH SENSITIVITY
Troponin T High Sensitivity: 20 ng/L — ABNORMAL HIGH (ref 0–19)
Troponin T High Sensitivity: 16 ng/L (ref 0–19)

## 2023-11-24 LAB — FERRITIN: Ferritin: 66 ng/mL (ref 24–336)

## 2023-11-24 LAB — OCCULT BLOOD X 1 CARD TO LAB, STOOL: Fecal Occult Bld: NEGATIVE

## 2023-11-24 LAB — LIPASE, BLOOD: Lipase: 30 U/L (ref 11–51)

## 2023-11-24 MED ORDER — SODIUM CHLORIDE 0.9 % IV SOLN: INTRAVENOUS | Status: DC

## 2023-11-24 MED ORDER — LACTATED RINGERS IV BOLUS
1000.0000 mL | Freq: Once | INTRAVENOUS | Status: AC
  Administered 2023-11-24: 1000 mL via INTRAVENOUS

## 2023-11-24 NOTE — ED Notes (Signed)
 ED Provider at bedside.

## 2023-11-24 NOTE — ED Notes (Addendum)
 Pt reports being at [facility] appointment today to receive an iron  infusion. Pt reports he instantly started to feel warm and dizzy after the infusion started. Denies any previous reaction. Pt states his blood pressure was low and had an episode of incontinent diarrhea. Denies LOC.

## 2023-11-24 NOTE — Progress Notes (Signed)
 Patient presented for Feraheme  iron  infusion. Before treatment initiation, he reported feeling lightheaded. Blood pressure was 85/53 mmHg. Normal saline IV fluids were started immediately at 500 mL/hr. Provider was notified, and orthostatic blood pressures were ordered and obtained. Patient confirmed taking his blood pressure medication that morning. He was assisted to the bathroom via wheelchair for bowel incontinence care. Provider assessed the patient at chairside and ordered immediate transfer to the ED. Patient was transported via stretcher.

## 2023-11-24 NOTE — Patient Instructions (Signed)
 Rehydration, Older Adult  Rehydration is the replacement of fluids, salts, and minerals in the body (electrolytes) that are lost during dehydration. Dehydration is when there is not enough water or other fluids in the body. This happens when you lose more fluids than you take in. People who are age 80 or older have a higher risk of dehydration than younger adults. This is because in older age, the body: Is less able to maintain the right amount of water. Does not respond to temperature changes as well. Does not get a sense of thirst as easily or quickly. Other causes include: Not drinking enough fluids. This can occur when you are ill, when you forget to drink, or when you are doing activities that require a lot of energy, especially in hot weather. Conditions that cause loss of water or other fluids. These include diarrhea, vomiting, sweating, or urinating a lot. Other illnesses, such as fever or infection. Certain medicines, such as those that remove excess fluid from the body (diuretics). Symptoms of mild or moderate dehydration may include thirst, dry lips and mouth, and dizziness. Symptoms of severe dehydration may include increased heart rate, confusion, fainting, and not urinating. In severe cases, you may need to get fluids through an IV at the hospital. For mild or moderate cases, you can usually rehydrate at home by drinking certain fluids as told by your health care provider. What are the risks? Rehydration is usually safe. Taking in too much fluid (overhydration) can be a problem but is rare. Overhydration can cause an imbalance of electrolytes in the body, kidney failure, fluid in the lungs, or a decrease in salt (sodium) levels in the body. Supplies needed: You will need an oral rehydration solution (ORS) if your health care provider tells you to use one. This is a drink to treat dehydration. It can be found in pharmacies and retail stores. How to rehydrate Fluids Follow  instructions from your health care provider about what to drink. The kind of fluid and the amount you should drink depend on your condition. In general, you should choose drinks that you prefer. If told by your health care provider, drink an ORS. Make an ORS by following instructions on the package. Start by drinking small amounts, about  cup (120 mL) every 5-10 minutes. Slowly increase how much you drink until you have taken in the amount recommended by your health care provider. Drink enough clear fluids to keep your urine pale yellow. If you were told to drink an ORS, finish it first, then start slowly drinking other clear fluids. Drink fluids such as: Water. This includes sparkling and flavored water. Drinking only water can lead to having too little sodium in your body (hyponatremia). Follow the advice of your health care provider. Water from ice chips you suck on. Fruit juice with water added to it(diluted). Sports drinks. Hot or cold herbal teas. Broth-based soups. Coffee. Milk or milk products. Food Follow instructions from your health care provider about what to eat while you rehydrate. Your health care provider may recommend that you slowly begin eating regular foods in small amounts. Eat foods that contain a healthy balance of electrolytes, such as bananas, oranges, potatoes, tomatoes, and spinach. Avoid foods that are greasy or contain a lot of sugar. In some cases, you may get nutrition through a feeding tube that is passed through your nose and into your stomach (nasogastric tube, or NG tube). This may be done if you have uncontrolled vomiting or diarrhea. Drinks to avoid  Certain drinks may make dehydration worse. While you rehydrate, avoid drinking alcohol. How to tell if you are recovering from dehydration You may be getting better if: You are urinating more often than before you started rehydrating. Your urine is pale yellow. Your energy level improves. You vomit less  often. You have diarrhea less often. Your appetite improves or returns to normal. You feel less dizzy or light-headed. Your skin tone and color start to look more normal. Follow these instructions at home: Take over-the-counter and prescription medicines only as told by your health care provider. Do not take sodium tablets. Doing this can lead to having too much sodium in your body (hypernatremia). Contact a health care provider if: You continue to have symptoms of mild or moderate dehydration, such as: Thirst. Dry lips. Slightly dry mouth. Dizziness. Dark urine or less urine than usual. Muscle cramps. You continue to vomit or have diarrhea. Get help right away if: You have symptoms of dehydration that get worse. You have a fever. You have a severe headache. You have been vomiting and have problems, such as: Your vomiting gets worse. Your vomit includes blood or green matter (bile). You cannot eat or drink without vomiting. You have problems with urination or bowel movements, such as: Diarrhea that gets worse. Blood in your stool (feces). This may cause stool to look black and tarry. Not urinating, or urinating only a small amount of very dark urine, within 6-8 hours. You have trouble breathing. You have symptoms that get worse with treatment. These symptoms may be an emergency. Get help right away. Call 911. Do not wait to see if the symptoms will go away. Do not drive yourself to the hospital. This information is not intended to replace advice given to you by your health care provider. Make sure you discuss any questions you have with your health care provider. Orthostatic Hypotension Blood pressure is a measurement of how strongly, or weakly, your circulating blood is pressing against the walls of your arteries. Orthostatic hypotension is a drop in blood pressure that can happen when you change positions, such as when you go from lying down to standing. Arteries are blood  vessels that carry blood from your heart throughout your body. When blood pressure is too low, you may not get enough blood to your brain or to the rest of your organs. Orthostatic hypotension can cause light-headedness, sweating, rapid heartbeat, blurred vision, and fainting. These symptoms require further investigation into the cause. What are the causes? Orthostatic hypotension can be caused by many things, including: Sudden changes in posture, such as standing up quickly after you have been sitting or lying down. Loss of blood (anemia) or loss of body fluids (dehydration). Heart problems, neurologic problems, or hormone problems. Pregnancy. Aging. The risk for this condition increases as you get older. Severe infection (sepsis). Certain medicines, such as medicines for high blood pressure or medicines that make the body lose excess fluids (diuretics). What are the signs or symptoms? Symptoms of this condition may include: Weakness, light-headedness, or dizziness. Sweating. Blurred vision. Tiredness (fatigue). Rapid heartbeat. Fainting, in severe cases. How is this diagnosed? This condition is diagnosed based on: Your symptoms and medical history. Your blood pressure measurements. Your health care provider will check your blood pressure when you are: Lying down. Sitting. Standing. A blood pressure reading is recorded as two numbers, such as 120 over 80 (or 120/80). The first (top) number is called the systolic pressure. It is a measure of the pressure in  your arteries as your heart beats. The second (bottom) number is called the diastolic pressure. It is a measure of the pressure in your arteries when your heart relaxes between beats. Blood pressure is measured in a unit called mmHg. Healthy blood pressure for most adults is 120/80 mmHg. Orthostatic hypotension is defined as a 20 mmHg drop in systolic pressure or a 10 mmHg drop in diastolic pressure within 3 minutes of  standing. Other information or tests that may be used to diagnose orthostatic hypotension include: Your other vital signs, such as your heart rate and temperature. Blood tests. An electrocardiogram (ECG) or echocardiogram. A Holter monitor. This is a device you wear that records your heart rhythm continuously, usually for 24-48 hours. Tilt table test. For this test, you will be safely secured to a table that moves you from a lying position to an upright position. Your heart rhythm and blood pressure will be monitored during the test. How is this treated? This condition may be treated by: Changing your diet. This may involve eating more salt (sodium) or drinking more water. Changing the dosage of certain medicines you are taking that might be lowering your blood pressure. Correcting the underlying reason for the orthostatic hypotension. Wearing compression stockings. Taking medicines to raise your blood pressure. Avoiding actions that trigger symptoms. Follow these instructions at home: Medicines Take over-the-counter and prescription medicines only as told by your health care provider. Follow instructions from your health care provider about changing the dosage of your current medicines, if this applies. Do not stop or adjust any of your medicines on your own. Eating and drinking  Drink enough fluid to keep your urine pale yellow. Eat extra salt only as directed. Do not add extra salt to your diet unless advised by your health care provider. Eat frequent, small meals. Avoid standing up suddenly after eating. General instructions  Get up slowly from lying down or sitting positions. This gives your blood pressure a chance to adjust. Avoid hot showers and excessive heat as directed by your health care provider. Engage in regular physical activity as directed by your health care provider. If you have compression stockings, wear them as told. Keep all follow-up visits. This is  important. Contact a health care provider if: You have a fever for more than 2-3 days. You feel more thirsty than usual. You feel dizzy or weak. Get help right away if: You have chest pain. You have a fast or irregular heartbeat. You become sweaty or feel light-headed. You feel short of breath. You faint. You have any symptoms of a stroke. BE FAST is an easy way to remember the main warning signs of a stroke: B - Balance. Signs are dizziness, sudden trouble walking, or loss of balance. E - Eyes. Signs are trouble seeing or a sudden change in vision. F - Face. Signs are sudden weakness or numbness of the face, or the face or eyelid drooping on one side. A - Arms. Signs are weakness or numbness in an arm. This happens suddenly and usually on one side of the body. S - Speech. Signs are sudden trouble speaking, slurred speech, or trouble understanding what people say. T - Time. Time to call emergency services. Write down what time symptoms started. You have other signs of a stroke, such as: A sudden, severe headache with no known cause. Nausea or vomiting. Seizure. These symptoms may represent a serious problem that is an emergency. Do not wait to see if the symptoms will go away.  Get medical help right away. Call your local emergency services (911 in the [place] Do not drive yourself to the hospital. Summary Orthostatic hypotension is a sudden drop in blood pressure. It can cause light-headedness, sweating, rapid heartbeat, blurred vision, and fainting. Orthostatic hypotension can be diagnosed by having your blood pressure taken while lying down, sitting, and then standing. Treatment may involve changing your diet, wearing compression stockings, sitting up slowly, adjusting your medicines, or correcting the underlying reason for the orthostatic hypotension. Get help right away if you have chest pain, a fast or irregular heartbeat, or symptoms of a stroke. This information is not intended  to replace advice given to you by your health care provider. Make sure you discuss any questions you have with your health care provider. Document Revised: 06/12/2020 Document Reviewed: 06/12/2020 Elsevier Patient Education  2024 [employer] Inc. Document Revised: 08/12/2021 Document Reviewed: 08/10/2021 Elsevier Patient Education  2024 [employer].

## 2023-11-24 NOTE — Progress Notes (Signed)
  [facility] OFFICE PROGRESS NOTE   Diagnosis: Iron  deficiency anemia  INTERVAL HISTORY:   Randy Jacobson returns for follow-up.  He continues oral iron .  He was last seen 11/03/2023.  CBC showed persistent anemia.  Ferritin was in normal range but in the setting of recent infection and surgery difficult to interpret.  Dr. Cloretta and I reviewed the peripheral blood smear-consistent with iron  deficiency.  Arrangements are made for IV iron .  He is seen prior to proceeding with the first infusion.  He feels his hemoglobin is lower due to periodic lightheadedness.  He is not aware of any bleeding.  He notes stools are dark due to oral iron .  He does not crave ice.  No tongue soreness.  No constipation or nausea with oral iron .  Objective:  Vital signs in last 24 hours:  Blood pressure 122/63, pulse 87, temperature 98.1 F (36.7 C), temperature source Temporal, resp. rate 18, height 5' 10 (1.778 m), weight 177 lb 1.6 oz (80.3 kg), SpO2 100%.    HEENT: No thrush or ulcers. Resp: Lungs clear bilaterally. Cardio: Regular rate and rhythm. GI: No hepatosplenomegaly. Vascular: No leg edema. Neuro: Alert and oriented. Skin: Right foot is wrapped.   Lab Results:  Lab Results  Component Value Date   WBC 7.7 11/24/2023   HGB 10.5 (L) 11/24/2023   HCT 33.2 (L) 11/24/2023   MCV 95.7 11/24/2023   PLT 274 11/24/2023   NEUTROABS 4.8 11/24/2023    Imaging:  No results found.  Medications: I have reviewed the patient's current medications.  Assessment/Plan: Anemia Chronic anemia with intermittent Red cell microcytosis Documented iron  deficiency on repeat ferritin testing including 05/23/2023 He has received Feraheme  and Venofer  in the past 01/16/2023 fecal occult blood positive; negative GI workup for a source of blood loss including upper endoscopy, colonoscopy, and small bowel camera endoscopy 01/18/2023 Feraheme  05/26/2023, 06/02/2023 10/06/2023 normal B12, DAT negative,  myeloma panel negative Positive stool cards x 3 10/20/2023, 10/22/2023, 10/25/2023 11/24/2023 Feraheme  History of iron  deficiency anemia in 2018 Gastric ulcers 2018 4.   Diabetes 5.   Hypertension 6.   Peripheral vascular disease-maintained on aspirin  and Plavix  7.   Carotid artery stenosis 8.   Peripheral neuropathy  Disposition: Randy Jacobson appears stable.  He has persistent anemia.  Recent review of the peripheral blood smear consistent with ongoing iron  deficiency.  He agrees with a trial of IV iron .  We discussed the potential for a severe allergic reaction.  He would like to proceed.  He will return for the second dose of Feraheme  in 1 week.  He will return for lab and follow-up in 4 weeks.  We are available to see him sooner if needed.  He has undergone negative GI workup for a source of blood loss.  Recent stool cards positive for blood.  He will follow-up with gastroenterology.  Plan reviewed with Dr. Cloretta.    Randy Jacobson ANP/GNP-BC   11/24/2023  10:42 AM   Addendum 10:33 AM-Randy Jacobson reported lightheadedness in the infusion area prior to receiving IV iron .  He was hypotensive, blood pressure 85/53.  IV fluids initiated.  Manual blood pressure 92/56.  He reported urge to have a bowel movement.  He was incontinent of stool, nurse tech noted stool was black.  He subsequently had a large liquid black stool.  We are in the process of transporting him to the emergency department.

## 2023-11-24 NOTE — ED Provider Notes (Signed)
  Physical Exam  BP (!) 183/84   Pulse 91   Temp 98 F (36.7 C)   Resp 13   SpO2 99%   Physical Exam  Procedures  Procedures  ED Course / MDM    Medical Decision Making Amount and/or Complexity of Data Reviewed Labs: ordered.   Patient presents after near syncopal episode.  Was incontinent of stool.  Initially lactic acid mildly elevated and then increased some.  Blood pressure improved.  Blood work overall reassuring.  Initial troponin just barely above normal but is normalized.  Has had fluid bolus.  Patient feeling much better.  Really does not want to be here.  After discussion I think it is reasonable to send him home now.  Doubt severe sepsis as the cause.  Stool studies had been done but not resulted.  Patient can go home and will follow-up with his PCP.       Patsey Lot, MD 11/24/23 1558

## 2023-11-24 NOTE — ED Provider Notes (Signed)
 [place] EMERGENCY DEPARTMENT AT [facility] Provider Note   CSN: [id number] Arrival date & time: 11/24/23  1037     Patient presents with: Hypotension   Randy Jacobson is a 78 y.o. male.   HPI Patient was at the infusion center for Feraheme  injection.  Patient reports he felt well when he went to the infusion center he was asymptomatic.  He reports he had a normal bowel movement this morning.  He reports they got his IV established and he got some medication that started to inject and he quite abruptly started to feel like he was getting warm on the back of his neck, lightheaded and almost near syncopal.  He reports he had a bowel movement without being aware of it.  However, notes from the infusion center indicate that the patient reported lightheadedness while he was waiting for his infusion, they documented low blood pressure and gave him 500 cc of normal saline and brought him to the emergency department.  Patient is adamant that there was some injection started before his symptoms and that he was well before that.  On arrival to the emergency department, he reports his symptoms are essentially gone.  He reports he no longer feels lightheaded he is not having any trouble breathing.  The patient had a ray amputation of the right foot and sutures were removed several days ago.  He reports it is healing well.  He reports there was some pain in the foot last night, he took a pain medication and since then the pain is now well-controlled.    Prior to Admission medications   Medication Sig Start Date End Date Taking? Authorizing Provider  acetaminophen  (TYLENOL ) 500 MG tablet Take 1 tablet (500 mg total) by mouth every 8 (eight) hours as needed for mild pain (pain score 1-3) or fever. 10/31/23   Dennise Lavada POUR, MD  albuterol  (VENTOLIN  HFA) 108 (90 Base) MCG/ACT inhaler Inhale 2 puffs into the lungs every 6 (six) hours as needed for wheezing or shortness of breath. 11/14/23    Medina-Vargas, Monina C, NP  amLODipine  (NORVASC ) 10 MG tablet Take 1 tablet (10 mg total) by mouth daily. 11/01/23   Singh, Prashant K, MD  Ascorbic Acid (VITAMIN C ) 1000 MG tablet Take 1 tablet (1,000 mg total) by mouth daily. 05/18/23   Medina-Vargas, Monina C, NP  aspirin  EC 81 MG tablet Take 1 tablet (81 mg total) by mouth daily at 6 (six) AM. Swallow whole. 10/19/22   Schuh, McKenzi P, PA-C  atorvastatin  (LIPITOR) 40 MG tablet Take 1 tablet by mouth once daily 11/09/23   Medina-Vargas, Monina C, NP  clopidogrel  (PLAVIX ) 75 MG tablet Take 1 tablet (75 mg total) by mouth daily. 05/18/23   Medina-Vargas, Monina C, NP  Continuous Glucose Sensor (FREESTYLE LIBRE 2 SENSOR) MISC 2 Devices by Does not apply route daily. E11.69 04/18/23   Medina-Vargas, Monina C, NP  ferrous sulfate  325 (65 FE) MG tablet Take 1 tablet (325 mg total) by mouth daily with breakfast. 10/12/23   Medina-Vargas, Monina C, NP  gabapentin  (NEURONTIN ) 100 MG capsule Take 1 capsule (100 mg total) by mouth 3 (three) times daily. 09/07/23   Ngetich, Dinah C, NP  hydrochlorothiazide  (HYDRODIURIL ) 25 MG tablet Take 0.5 tablets (12.5 mg total) by mouth daily. 08/04/23   Medina-Vargas, Monina C, NP  losartan  (COZAAR ) 50 MG tablet Take 1 tablet (50 mg total) by mouth in the morning and at bedtime. 07/19/23   Medina-Vargas, Monina C, NP  Melatonin  10 MG TABS Take 10 mg by mouth at bedtime.    [provider]  metFORMIN  (GLUCOPHAGE ) 1000 MG tablet TAKE 1 TABLET BY MOUTH TWICE DAILY WITH A MEAL 11/10/23   Medina-Vargas, Monina C, NP  pantoprazole  (PROTONIX ) 40 MG tablet Take 1 tablet (40 mg total) by mouth daily. 09/08/23   Medina-Vargas, Monina C, NP  silver sulfADIAZINE (SILVADENE) 1 % cream Apply 1 Application topically daily. 10/24/23   [provider]  sitaGLIPtin  (JANUVIA ) 50 MG tablet Take 1 tablet (50 mg total) by mouth daily as needed (for high blood sugar). 09/20/23   Medina-Vargas, Monina C, NP  TRESIBA  FLEXTOUCH 100 UNIT/ML  FlexTouch Pen Inject 38 Units into the skin daily. 08/15/23   Medina-Vargas, Monina C, NP    Allergies: Patient has no known allergies.    Review of Systems  Updated Vital Signs BP (!) 130/54   Pulse 81   Temp 97.9 F (36.6 C) (Oral)   Resp 12   SpO2 98%   Physical Exam Constitutional:      Comments: Patient is alert and nontoxic.  Mental status clear.  No respiratory distress.  HENT:     Head: Normocephalic and atraumatic.     Mouth/Throat:     Mouth: Mucous membranes are moist.     Pharynx: Oropharynx is clear.  Eyes:     Extraocular Movements: Extraocular movements intact.  Cardiovascular:     Rate and Rhythm: Normal rate and regular rhythm.  Pulmonary:     Effort: Pulmonary effort is normal.     Breath sounds: Normal breath sounds.  Abdominal:     General: There is no distension.     Palpations: Abdomen is soft.     Tenderness: There is no abdominal tenderness. There is no guarding.  Musculoskeletal:        General: Normal range of motion.     Comments: No peripheral edema.  Calves are soft nontender.  Patient has very well-healed incision from lateral ray amputation of the right lateral foot.  No erythema, no drainage.  Nearly completely healed.  Skin:    General: Skin is warm and dry.  Neurological:     General: No focal deficit present.     Mental Status: He is oriented to person, place, and time.     Motor: No weakness.     Coordination: Coordination normal.  Psychiatric:        Mood and Affect: Mood normal.     (all labs ordered are listed, but only abnormal results are displayed) Labs Reviewed  COMPREHENSIVE METABOLIC PANEL WITH GFR - Abnormal; Notable for the following components:      Result Value   Glucose, Bld 137 (*)    Calcium  8.7 (*)    All other components within normal limits  LACTIC ACID, PLASMA - Abnormal; Notable for the following components:   Lactic Acid, Venous 2.0 (*)    All other components within normal limits  LACTIC ACID, PLASMA -  Abnormal; Notable for the following components:   Lactic Acid, Venous 3.1 (*)    All other components within normal limits  CBC WITH DIFFERENTIAL/PLATELET - Abnormal; Notable for the following components:   WBC 14.1 (*)    RBC 3.16 (*)    Hemoglobin 9.9 (*)    HCT 30.7 (*)    RDW 16.9 (*)    Neutro Abs 11.4 (*)    Monocytes Absolute 1.3 (*)    Abs Immature Granulocytes 0.13 (*)    All other  components within normal limits  TROPONIN T, HIGH SENSITIVITY - Abnormal; Notable for the following components:   Troponin T High Sensitivity 20 (*)    All other components within normal limits  RESP PANEL BY RT-PCR (RSV, FLU A&B, COVID)  RVPGX2  CULTURE, BLOOD (ROUTINE X 2)  CULTURE, BLOOD (ROUTINE X 2)  C DIFFICILE QUICK SCREEN W PCR REFLEX    GASTROINTESTINAL PANEL BY PCR, STOOL (REPLACES STOOL CULTURE)  LIPASE, BLOOD  URINALYSIS, ROUTINE W REFLEX MICROSCOPIC  OCCULT BLOOD X 1 CARD TO LAB, STOOL  TROPONIN T, HIGH SENSITIVITY    EKG: EKG Interpretation Date/Time:  Thursday November 24 2023 11:58:26 EDT Ventricular Rate:  73 PR Interval:  202 QRS Duration:  164 QT Interval:  430 QTC Calculation: 474 R Axis:   84  Text Interpretation: Sinus rhythm Right bundle branch block no sig change from previous Confirmed by Armenta Canning [phone]) on 11/24/2023 3:20:51 PM  Radiology: No results found. Patient has chest x-ray done from 8\4 interpreted as no acute disease.  I personally reviewed these images.  No appearance of consolidation.  Procedures   Medications Ordered in the ED  lactated ringers  bolus 1,000 mL (has no administration in time range)                                    Medical Decision Making Amount and/or Complexity of Data Reviewed Labs: ordered.   Patient is brought from the infusion center as per described.  There is some discrepancy between the patient's recount of his series of events and documented notes from this morning.  It does not appear that the patient had  any Feraheme  infused so, it is difficult to conceive of the patient's event as an allergic reaction.  As described, patient describes what sounds like a adverse event starting acutely after some medication or fluid infused.  Upon arrival to the emergency department, symptoms are essentially completely resolved.  Patient is well in appearance and vital signs are stable.  Diagnostic evaluation initiated for episode of hypotension with near syncope and loss of control of bowel.  Urinalysis negative.  Troponin 20 then 16.  White count 14.1 H&H 9.9 and 30 lactic acid 2.1 then 3.1.  Patient had chest x-ray 8\4 interpreted as negative.  Patient does not wish to do repeat chest x-ray.  He feels that he has had a lot of radiation.  He does not report any increasing cough fever or chills since that time.  Patient had partial amputation of the right foot.  On examination this is nearly completely healed and does not show any signs of wound infection.  Given white count and elevated lactic will proceed with obtaining blood cultures.  At this time no focus of infection is apparent.  Patient had loss of control of bowels once during the course of the event.  Unclear if patient might have C. difficile or diarrheal illness although he clearly cites a normal bowel movement this morning and no diarrhea until this occurrence.  If stool available will submit for culture and C. difficile.  At this time however difficult to conclude that patient has C. difficile based on 1 episode of loss of bowel continence.  Given mild elevation in lactic will administer a liter of fluids and plan to recheck lactic acid.  If this is stabilizing and flat, anticipate patient will be appropriate for discharge.,  If this is stabilizing flat I  do anticipate patient will be appropriate for discharge.  If this is stabilizing flat I do anticipate patient will be appropriate for discharge.  Patient feels well and vital signs remained stable.      Final diagnoses:  Near syncope  Hypotension, unspecified hypotension type  Chronic anemia    ED Discharge Orders     None          Armenta Canning, MD 11/24/23 1528

## 2023-11-25 ENCOUNTER — Ambulatory Visit (HOSPITAL_COMMUNITY)

## 2023-11-25 LAB — GASTROINTESTINAL PANEL BY PCR, STOOL (REPLACES STOOL CULTURE)

## 2023-11-25 LAB — LACTIC ACID, PLASMA: Lactic Acid, Venous: 3.1 mmol/L (ref 0.5–1.9)

## 2023-11-28 ENCOUNTER — Telehealth: Payer: Self-pay | Admitting: *Deleted

## 2023-11-28 ENCOUNTER — Other Ambulatory Visit (HOSPITAL_COMMUNITY): Payer: Self-pay

## 2023-11-28 ENCOUNTER — Encounter: Payer: Self-pay | Admitting: Nurse Practitioner

## 2023-11-28 ENCOUNTER — Telehealth: Payer: Self-pay | Admitting: Adult Health

## 2023-11-28 MED ORDER — AMLODIPINE BESYLATE 10 MG PO TABS: 10.0000 mg | ORAL_TABLET | Freq: Every day | ORAL | 1 refills | Status: DC

## 2023-11-28 NOTE — Telephone Encounter (Signed)
 Copied from CRM #[id number]. Topic: Clinical - Medication Refill >> Nov 28, 2023 10:50 AM Susanna ORN wrote: Medication: amLODipine  (NORVASC ) 10 MG tablet  Has the patient contacted their pharmacy? No (Agent: If no, request that the patient contact the pharmacy for the refill. If patient does not wish to contact the pharmacy document the reason why and proceed with request.) (Agent: If yes, when and what did the pharmacy advise?)  This is the patient's preferred pharmacy:  [facility] [address] [place] - [address] [place] [address] Phone: [phone] Fax: [phone]   Is this the correct pharmacy for this prescription? Yes If no, delete pharmacy and type the correct one.   Has the prescription been filled recently? Yes  Is the patient out of the medication? No but states he's almost out.   Has the patient been seen for an appointment in the last year OR does the patient have an upcoming appointment? Yes  Can we respond through MyChart? No  Agent: Please be advised that Rx refills may take up to 3 business days. We ask that you follow-up with your pharmacy.

## 2023-11-28 NOTE — Telephone Encounter (Signed)
 Patient notified and agreed. Patient requested refill to be sent to [facility].

## 2023-11-28 NOTE — Telephone Encounter (Signed)
 Patient was prescribed Amlodipine  in ED, should he continue to take? Routing for review, please advise patient.

## 2023-11-28 NOTE — Telephone Encounter (Signed)
 Called Randy Jacobson responding to voice mail left this am. Asking why they did not test him for an infection when he was in the ER last week. Informed him that since he had no fever or being immunocompromised there was a low suspicion for this. WBC was mildly elevated. He expressed concern about upcoming iron  infusion this week (had not received any iron  when his BP dropped). Informed him that nurses will take good care of him when he returns.

## 2023-11-28 NOTE — Telephone Encounter (Deleted)
 Noted

## 2023-11-28 NOTE — Telephone Encounter (Signed)
 He started Amlodipine  even before recent ED. He needs to continue it.

## 2023-11-28 NOTE — Telephone Encounter (Signed)
 Copied from CRM #[id number]. Topic: Clinical - Medication Question >> Nov 28, 2023 10:48 AM Randy Jacobson wrote: Reason for CRM: Patient states he was given Amlodipine  10 mg while he was in the hospital. He wants to know if this is something that he needs to be on permanent or temporary? Patient states if it's permanent, he will need a refill sent over to [facility] on [address] in [place]. Please give patient a call back to advise. CB #: [id number].

## 2023-12-01 ENCOUNTER — Ambulatory Visit: Admitting: Podiatry

## 2023-12-01 ENCOUNTER — Inpatient Hospital Stay

## 2023-12-01 ENCOUNTER — Ambulatory Visit

## 2023-12-01 VITALS — BP 136/60 | HR 76 | Temp 97.6°F | Resp 16 | Ht 69.0 in | Wt 205.8 lb

## 2023-12-01 DIAGNOSIS — E1151 Type 2 diabetes mellitus with diabetic peripheral angiopathy without gangrene: Secondary | ICD-10-CM | POA: Diagnosis not present

## 2023-12-01 DIAGNOSIS — K259 Gastric ulcer, unspecified as acute or chronic, without hemorrhage or perforation: Secondary | ICD-10-CM | POA: Diagnosis not present

## 2023-12-01 DIAGNOSIS — E611 Iron deficiency: Secondary | ICD-10-CM

## 2023-12-01 DIAGNOSIS — I959 Hypotension, unspecified: Secondary | ICD-10-CM | POA: Diagnosis not present

## 2023-12-01 DIAGNOSIS — D649 Anemia, unspecified: Secondary | ICD-10-CM

## 2023-12-01 DIAGNOSIS — R195 Other fecal abnormalities: Secondary | ICD-10-CM | POA: Diagnosis not present

## 2023-12-01 DIAGNOSIS — E1142 Type 2 diabetes mellitus with diabetic polyneuropathy: Secondary | ICD-10-CM | POA: Diagnosis not present

## 2023-12-01 DIAGNOSIS — Z7982 Long term (current) use of aspirin: Secondary | ICD-10-CM | POA: Diagnosis not present

## 2023-12-01 DIAGNOSIS — Z7902 Long term (current) use of antithrombotics/antiplatelets: Secondary | ICD-10-CM | POA: Diagnosis not present

## 2023-12-01 DIAGNOSIS — I10 Essential (primary) hypertension: Secondary | ICD-10-CM | POA: Diagnosis not present

## 2023-12-01 DIAGNOSIS — M79671 Pain in right foot: Secondary | ICD-10-CM

## 2023-12-01 DIAGNOSIS — D509 Iron deficiency anemia, unspecified: Secondary | ICD-10-CM

## 2023-12-01 DIAGNOSIS — I6529 Occlusion and stenosis of unspecified carotid artery: Secondary | ICD-10-CM | POA: Diagnosis not present

## 2023-12-01 LAB — CBC WITH DIFFERENTIAL (CANCER CENTER ONLY)
Abs Immature Granulocytes: 0.06 K/uL (ref 0.00–0.07)
Basophils Absolute: 0.1 K/uL (ref 0.0–0.1)
Basophils Relative: 1 %
Eosinophils Absolute: 0.2 K/uL (ref 0.0–0.5)
Eosinophils Relative: 2 %
HCT: 30.6 % — ABNORMAL LOW (ref 39.0–52.0)
Hemoglobin: 10.1 g/dL — ABNORMAL LOW (ref 13.0–17.0)
Immature Granulocytes: 1 %
Lymphocytes Relative: 15 %
Lymphs Abs: 1.3 K/uL (ref 0.7–4.0)
MCH: 31.3 pg (ref 26.0–34.0)
MCHC: 33 g/dL (ref 30.0–36.0)
MCV: 94.7 fL (ref 80.0–100.0)
Monocytes Absolute: 1.1 K/uL — ABNORMAL HIGH (ref 0.1–1.0)
Monocytes Relative: 13 %
Neutro Abs: 6.2 K/uL (ref 1.7–7.7)
Neutrophils Relative %: 68 %
Platelet Count: 243 K/uL (ref 150–400)
RBC: 3.23 MIL/uL — ABNORMAL LOW (ref 4.22–5.81)
RDW: 16.6 % — ABNORMAL HIGH (ref 11.5–15.5)
WBC Count: 8.9 K/uL (ref 4.0–10.5)
nRBC: 0 % (ref 0.0–0.2)

## 2023-12-01 LAB — FERRITIN: Ferritin: 48 ng/mL (ref 24–336)

## 2023-12-01 MED ORDER — SODIUM CHLORIDE 0.9 % IV SOLN: INTRAVENOUS | Status: DC

## 2023-12-01 MED ORDER — SODIUM CHLORIDE 0.9 % IV SOLN
510.0000 mg | Freq: Once | INTRAVENOUS | Status: AC
  Administered 2023-12-01: 510 mg via INTRAVENOUS
  Filled 2023-12-01: qty 510

## 2023-12-01 MED ORDER — HYDROCODONE-ACETAMINOPHEN 5-325 MG PO TABS: 1.0000 | ORAL_TABLET | Freq: Four times a day (QID) | ORAL | 0 refills | Status: DC | PRN

## 2023-12-01 MED ORDER — HYDROCODONE-ACETAMINOPHEN 5-325 MG PO TABS: 1.0000 | ORAL_TABLET | Freq: Four times a day (QID) | ORAL | 0 refills | Status: AC | PRN

## 2023-12-01 NOTE — Progress Notes (Signed)
 Patient presents 5 weeks status post partial fifth ray amputation.  He has been having some sharp pains mostly at night.  He had 1 or 2 hydrocodone 's leftover from previous surgery and he took 1 or going to bed and it helped a lot with the pain.  Helped him sleep.  No fever or chills or nausea or vomiting.   Physical exam:  General appearance: Pleasant, and in no acute distress. AOx3.  Vascular: Pedal pulses: Normal postoperative edema at surgery site consistent with the procedure done. Capillary fill time immediate right.  Neurological: Positive Tinel's sign at dorsal aspect of surgery site right.  Dermatologic:   Incision healed well.  No erythema normal postoperative swelling.  No signs of any infection.  Skin normal temperature bilaterally.  Skin normal color, tone, and texture bilaterally.   Musculoskeletal: Status post partial fifth ray amputation.  Radiographs: 3 views right foot; fifth metatarsal base normal with some lateral angulation.  Osteotomy site healing well with no periosteal reaction or evidence of bone growth..  Diagnosis: 1.  5 weeks status post partial fifth ray amputation right foot.  Plan: -POV. - Discussed with him the intermittent pains mostly at night he has been having.  Could be having a little bit of nerve sensitivity at the amputation site.  Also is having normal postop pain in the partial fifth ray amputation.  There is no signs of infection at the surgery site. -Rx hydroxycodon/acetaminophen  5/500, 1 p.o. every 6 hours as needed pain no refills  Return next week for appointment scheduled Dr. Janit postop

## 2023-12-02 ENCOUNTER — Telehealth: Payer: Self-pay | Admitting: Oncology

## 2023-12-02 NOTE — Telephone Encounter (Signed)
 Called to schedule upcoming iron  infusion. Appt details confirmed with PT.

## 2023-12-07 ENCOUNTER — Ambulatory Visit (INDEPENDENT_AMBULATORY_CARE_PROVIDER_SITE_OTHER): Admitting: Podiatry

## 2023-12-07 ENCOUNTER — Ambulatory Visit

## 2023-12-07 ENCOUNTER — Encounter: Payer: Self-pay | Admitting: Podiatry

## 2023-12-07 VITALS — Ht 70.0 in | Wt 177.1 lb

## 2023-12-07 DIAGNOSIS — L97514 Non-pressure chronic ulcer of other part of right foot with necrosis of bone: Secondary | ICD-10-CM

## 2023-12-07 NOTE — Progress Notes (Signed)
 Chief Complaint  Patient presents with   Routine Post Op    POV #4 DOS: 10/29/2023- AMPUTATION, FOOT, RAY (Right), pt states everything is going well, back to working, has no pain at all. States he had x-rays 5 days ago, came in to see Dr Christine and was told everything is healing well.    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  Inpatient.  DOS: 10/29/2023.  Doing well.  He has returned to work in regular shoes with no complications  Past Medical History:  Diagnosis Date   Diabetes mellitus without complication (HCC)    Dysrhythmia    chronic RBBB; bradycardia s/p atropine  following sheath removal, 3.8 second sinus pause 10/11/22   Hypertension    Peripheral arterial disease (HCC)    Peripheral artery disease (HCC)     Past Surgical History:  Procedure Laterality Date   stents in both my legs     ABDOMINAL AORTOGRAM W/LOWER EXTREMITY N/A 10/11/2022   Procedure: ABDOMINAL AORTOGRAM W/LOWER EXTREMITY;  Surgeon: Eliza Lonni RAMAN, MD;  Location: [facility] INVASIVE CV LAB;  Service: Cardiovascular;  Laterality: N/A;   ABDOMINAL AORTOGRAM W/LOWER EXTREMITY Left 06/20/2023   Procedure: ABDOMINAL AORTOGRAM W/LOWER EXTREMITY;  Surgeon: Pearline Norman RAMAN, MD;  Location: [facility] INVASIVE CV LAB;  Service: Cardiovascular;  Laterality: Left;   ABDOMINAL AORTOGRAM W/LOWER EXTREMITY N/A 10/17/2023   Procedure: ABDOMINAL AORTOGRAM W/LOWER EXTREMITY;  Surgeon: Pearline Norman RAMAN, MD;  Location: [facility] INVASIVE CV LAB;  Service: Cardiovascular;  Laterality: N/A;   AMPUTATION Right 10/29/2023   Procedure: AMPUTATION, FOOT, RAY;  Surgeon: Janit Thresa HERO, DPM;  Location: [facility] OR;  Service: Orthopedics/Podiatry;  Laterality: Right;  PARTIAL FIFTH RAY AMPUTATION   COLONOSCOPY WITH PROPOFOL  N/A 01/18/2023   Procedure: COLONOSCOPY WITH PROPOFOL ;  Surgeon: Saintclair Jasper, MD;  Location: [facility] ENDOSCOPY;  Service: Gastroenterology;  Laterality: N/A;   ENDARTERECTOMY FEMORAL Left  10/15/2022   Procedure: ENDARTERECTOMY OF LEFT ILIOFEMORAL ARTERY;  Surgeon: Eliza Lonni RAMAN, MD;  Location: [facility] OR;  Service: Vascular;  Laterality: Left;   ENDARTERECTOMY FEMORAL Right 06/23/2023   Procedure: COMMON FEMORAL ENDARTERECTOMY;  Surgeon: Pearline Norman RAMAN, MD;  Location: [facility] OR;  Service: Vascular;  Laterality: Right;   ESOPHAGOGASTRODUODENOSCOPY (EGD) WITH PROPOFOL  N/A 01/18/2023   Procedure: ESOPHAGOGASTRODUODENOSCOPY (EGD) WITH PROPOFOL ;  Surgeon: Saintclair Jasper, MD;  Location: [facility] ENDOSCOPY;  Service: Gastroenterology;  Laterality: N/A;   FEMORAL-POPLITEAL BYPASS GRAFT Left 10/15/2022   Procedure: LEFT FEMORAL-BELOW KNEE POPLITEAL ARTERY BYPASS WITH VEIN HARVEST OF LEFT GREATER SAPHENOUS VEIN;  Surgeon: Eliza Lonni RAMAN, MD;  Location: [facility] OR;  Service: Vascular;  Laterality: Left;   GIVENS CAPSULE STUDY N/A 04/10/2017   Procedure: GIVENS CAPSULE STUDY;  Surgeon: Dianna Specking, MD;  Location: [facility] ENDOSCOPY;  Service: Endoscopy;  Laterality: N/A;   GIVENS CAPSULE STUDY N/A 01/18/2023   Procedure: GIVENS CAPSULE STUDY;  Surgeon: Saintclair Jasper, MD;  Location: [facility] ENDOSCOPY;  Service: Gastroenterology;  Laterality: N/A;   INSERTION OF ILIAC STENT Right 06/23/2023   Procedure: INSERTION, STENT, ARTERY, ILIAC;  Surgeon: Pearline Norman RAMAN, MD;  Location: [facility] OR;  Service: Vascular;  Laterality: Right;   IR GENERIC HISTORICAL  08/26/2015   IR RADIOLOGIST EVAL & MGMT 08/26/2015 Wilkie Lent, MD [facility] INTERV RAD   IR GENERIC HISTORICAL  04/21/2016   IR RADIOLOGIST EVAL & MGMT 04/21/2016 Wilkie Lent, MD [facility] INTERV RAD   LOWER EXTREMITY ANGIOGRAM Right 06/23/2023   Procedure: GERALYN, LOWER EXTREMITY;  Surgeon: Pearline Norman  S, MD;  Location: [facility] OR;  Service: Vascular;  Laterality: Right;   LOWER EXTREMITY INTERVENTION Left 06/20/2023   Procedure: LOWER EXTREMITY INTERVENTION;  Surgeon: Pearline Norman RAMAN, MD;  Location: [facility] INVASIVE CV LAB;  Service: Cardiovascular;  Laterality: Left;   DCB   LOWER EXTREMITY INTERVENTION N/A 10/17/2023   Procedure: LOWER EXTREMITY INTERVENTION;  Surgeon: Pearline Norman RAMAN, MD;  Location: [facility] INVASIVE CV LAB;  Service: Cardiovascular;  Laterality: N/A;   PATCH ANGIOPLASTY Left 10/15/2022   Procedure: PATCH ANGIOPLASTY USING XENOSURE BIOLOGIC PATCH 1CMX14CM;  Surgeon: Eliza Lonni RAMAN, MD;  Location: [facility] OR;  Service: Vascular;  Laterality: Left;   PATCH ANGIOPLASTY Right 06/23/2023   Procedure: ANGIOPLASTY, USING PATCH GRAFT 6CM GEORGE;  Surgeon: Pearline Norman RAMAN, MD;  Location: [facility] OR;  Service: Vascular;  Laterality: Right;   PERIPHERAL VASCULAR INTERVENTION  10/11/2022   Procedure: PERIPHERAL VASCULAR INTERVENTION;  Surgeon: Eliza Lonni RAMAN, MD;  Location: [facility] INVASIVE CV LAB;  Service: Cardiovascular;;    No Known Allergies  Objective/Physical Exam Neurovascular status intact.  Incision is nicely healed.  No erythema or edema concerning for residual infection  Radiographic Exam RT foot 11/07/2023:  Partial fifth ray amputation right foot with clean osteotomy at the base of the fifth metatarsal.  No erosions concerning for osteomyelitis.  Staples noted along the lateral aspect of the foot  Assessment: 1. 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 DOS: 10/29/2023   Plan of Care:  -Patient was evaluated.  - Incision is nicely healed and there is no indication of infection. -Continue good supportive tennis shoes and sneakers -Okay to resume full activity no restrictions -Maintain good foot hygiene -Return to clinic PRN   Thresa EMERSON Sar, DPM [facility]  Dr. Thresa EMERSON Sar, DPM    [address] [place], [place] [address]                Office [phone]  Fax [phone]

## 2023-12-08 ENCOUNTER — Other Ambulatory Visit: Payer: Self-pay | Admitting: *Deleted

## 2023-12-08 ENCOUNTER — Inpatient Hospital Stay

## 2023-12-08 VITALS — BP 142/50 | HR 70 | Temp 98.6°F | Resp 16

## 2023-12-08 DIAGNOSIS — I6529 Occlusion and stenosis of unspecified carotid artery: Secondary | ICD-10-CM | POA: Diagnosis not present

## 2023-12-08 DIAGNOSIS — Z7982 Long term (current) use of aspirin: Secondary | ICD-10-CM | POA: Diagnosis not present

## 2023-12-08 DIAGNOSIS — Z7902 Long term (current) use of antithrombotics/antiplatelets: Secondary | ICD-10-CM | POA: Diagnosis not present

## 2023-12-08 DIAGNOSIS — E611 Iron deficiency: Secondary | ICD-10-CM

## 2023-12-08 DIAGNOSIS — E1151 Type 2 diabetes mellitus with diabetic peripheral angiopathy without gangrene: Secondary | ICD-10-CM | POA: Diagnosis not present

## 2023-12-08 DIAGNOSIS — D509 Iron deficiency anemia, unspecified: Secondary | ICD-10-CM | POA: Diagnosis not present

## 2023-12-08 DIAGNOSIS — D649 Anemia, unspecified: Secondary | ICD-10-CM

## 2023-12-08 DIAGNOSIS — I959 Hypotension, unspecified: Secondary | ICD-10-CM | POA: Diagnosis not present

## 2023-12-08 DIAGNOSIS — R195 Other fecal abnormalities: Secondary | ICD-10-CM | POA: Diagnosis not present

## 2023-12-08 DIAGNOSIS — E1142 Type 2 diabetes mellitus with diabetic polyneuropathy: Secondary | ICD-10-CM | POA: Diagnosis not present

## 2023-12-08 DIAGNOSIS — K259 Gastric ulcer, unspecified as acute or chronic, without hemorrhage or perforation: Secondary | ICD-10-CM | POA: Diagnosis not present

## 2023-12-08 DIAGNOSIS — I10 Essential (primary) hypertension: Secondary | ICD-10-CM | POA: Diagnosis not present

## 2023-12-08 MED ORDER — SODIUM CHLORIDE 0.9 % IV SOLN: INTRAVENOUS | Status: DC

## 2023-12-08 MED ORDER — FERROUS SULFATE 325 (65 FE) MG PO TABS: 325.0000 mg | ORAL_TABLET | Freq: Every day | ORAL | 0 refills | Status: DC

## 2023-12-08 MED ORDER — SODIUM CHLORIDE 0.9 % IV SOLN
510.0000 mg | Freq: Once | INTRAVENOUS | Status: AC
  Administered 2023-12-08: 510 mg via INTRAVENOUS
  Filled 2023-12-08: qty 510

## 2023-12-08 NOTE — Telephone Encounter (Signed)
 Randy Jacobson reports he had been taking his oral iron  bid and pharmacy won't refill since it is too early. Sent new script to [facility] with note to refill early due to past bid dosing and now back to daily.

## 2023-12-23 ENCOUNTER — Telehealth: Payer: Self-pay | Admitting: Adult Health

## 2023-12-23 ENCOUNTER — Telehealth: Payer: Self-pay | Admitting: Pharmacist

## 2023-12-23 DIAGNOSIS — E1165 Type 2 diabetes mellitus with hyperglycemia: Secondary | ICD-10-CM

## 2023-12-23 DIAGNOSIS — E1169 Type 2 diabetes mellitus with other specified complication: Secondary | ICD-10-CM

## 2023-12-23 MED ORDER — SITAGLIPTIN PHOSPHATE 50 MG PO TABS: 50.0000 mg | ORAL_TABLET | Freq: Every day | ORAL | 1 refills | Status: AC | PRN

## 2023-12-23 NOTE — Telephone Encounter (Signed)
 Copied from CRM [id number]. Topic: Clinical - Medication Prior Auth >> Dec 23, 2023  1:22 PM Diannia H wrote: Reason for CRM: Patient stated he needs this medicine sitaGLIPtin  (JANUVIA ) 50 MG tablet filled through [facility] so he doesn't have to pay for it like last time if not its going to be 200 plus and he can't afford that.

## 2023-12-23 NOTE — Progress Notes (Addendum)
   12/23/2023  Patient ID: Randy Jacobson, male   DOB: 12-06-1945, 79 y.o.   MRN: [id number]  Received a message from [facility] stating the Patient went to his local pharmacy to pick up Januvia  and was told it would be >$200.  Randy Jacobson has been approved to receive Januvia  through [facility] Patient Assistance Program since June.   Patient was called with [employer] Patient assistance on conference call.  Randy Jacobson is eligible to receive Januvia  until the end of the year.  He just needed to call in a refill to [facility] or their mail order Pharmacy--[facility].  Patient was educated that when his shipment comes, the bottle will have a label with the Pharmacy's name and contact information on it for him to get a refill.  He will need to complete renewal paperwork for 2026 after October.  The [employer] representative said he will receive a shipment in within 7-10 business days.  Plan: Follow up with the Patient at the end of October, beginning of November to discuss renewal for 2026.  A1c- 5.9% On statin -Atorvastatin  40 mg Losartan  50 mg filled 10/12/23 #90   [name], PharmD, BCACP Clinical Pharmacist [phone]      Plan: Follow up with Patient for renewal.

## 2023-12-23 NOTE — Telephone Encounter (Signed)
 I called patient to inform him that rx was sent to [facility]. Patient states that [facility] told him that it will cost 290.00 and he is unable to afford that and he would like to know if can get it for free. Patient aware I will forward this message to our pharmacy team to see what resources are available.

## 2023-12-23 NOTE — Telephone Encounter (Signed)
 Copied from CRM #[id number]. Topic: Clinical - Medication Refill >> Dec 23, 2023  1:20 PM Shamecia H wrote: Medication: sitaGLIPtin  (JANUVIA ) 50 MG tablet  Has the patient contacted their pharmacy? Yes (Agent: If no, request that the patient contact the pharmacy for the refill. If patient does not wish to contact the pharmacy document the reason why and proceed with request.) (Agent: If yes, when and what did the pharmacy advise?)  This is the patient's preferred pharmacy:  [facility] [address] [place] - [address] [place] [address] Phone: [phone] Fax: [phone]  Is this the correct pharmacy for this prescription? Yes If no, delete pharmacy and type the correct one.   Has the prescription been filled recently? No  Is the patient out of the medication? Yes  Has the patient been seen for an appointment in the last year OR does the patient have an upcoming appointment? Yes  Can we respond through MyChart? Yes  Agent: Please be advised that Rx refills may take up to 3 business days. We ask that you follow-up with your pharmacy.

## 2023-12-26 ENCOUNTER — Other Ambulatory Visit: Payer: Self-pay | Admitting: Adult Health

## 2023-12-29 ENCOUNTER — Inpatient Hospital Stay: Admitting: Oncology

## 2023-12-29 ENCOUNTER — Inpatient Hospital Stay: Attending: Oncology

## 2023-12-29 DIAGNOSIS — E1151 Type 2 diabetes mellitus with diabetic peripheral angiopathy without gangrene: Secondary | ICD-10-CM | POA: Insufficient documentation

## 2023-12-29 DIAGNOSIS — Z8711 Personal history of peptic ulcer disease: Secondary | ICD-10-CM | POA: Insufficient documentation

## 2023-12-29 DIAGNOSIS — I10 Essential (primary) hypertension: Secondary | ICD-10-CM | POA: Insufficient documentation

## 2023-12-29 DIAGNOSIS — I6529 Occlusion and stenosis of unspecified carotid artery: Secondary | ICD-10-CM | POA: Insufficient documentation

## 2023-12-29 DIAGNOSIS — D509 Iron deficiency anemia, unspecified: Secondary | ICD-10-CM | POA: Insufficient documentation

## 2023-12-29 DIAGNOSIS — Z7982 Long term (current) use of aspirin: Secondary | ICD-10-CM | POA: Insufficient documentation

## 2023-12-29 DIAGNOSIS — E1142 Type 2 diabetes mellitus with diabetic polyneuropathy: Secondary | ICD-10-CM | POA: Insufficient documentation

## 2023-12-29 DIAGNOSIS — Z7902 Long term (current) use of antithrombotics/antiplatelets: Secondary | ICD-10-CM | POA: Insufficient documentation

## 2023-12-30 NOTE — Telephone Encounter (Signed)
 Pt called to reschedule missed appt from 9/18. Lab and Follow-up scheduled for 9/25

## 2024-01-05 ENCOUNTER — Inpatient Hospital Stay

## 2024-01-05 ENCOUNTER — Inpatient Hospital Stay: Admitting: Nurse Practitioner

## 2024-01-05 ENCOUNTER — Ambulatory Visit: Admitting: Adult Health

## 2024-01-05 ENCOUNTER — Encounter: Payer: Self-pay | Admitting: Nurse Practitioner

## 2024-01-05 VITALS — BP 112/80 | HR 80 | Temp 99.0°F | Ht 70.0 in | Wt 181.4 lb

## 2024-01-05 VITALS — BP 112/55 | HR 92 | Temp 98.4°F | Resp 18 | Ht 70.0 in | Wt 181.0 lb

## 2024-01-05 DIAGNOSIS — Z8711 Personal history of peptic ulcer disease: Secondary | ICD-10-CM | POA: Diagnosis not present

## 2024-01-05 DIAGNOSIS — Z7982 Long term (current) use of aspirin: Secondary | ICD-10-CM | POA: Diagnosis not present

## 2024-01-05 DIAGNOSIS — Z0000 Encounter for general adult medical examination without abnormal findings: Secondary | ICD-10-CM

## 2024-01-05 DIAGNOSIS — E1151 Type 2 diabetes mellitus with diabetic peripheral angiopathy without gangrene: Secondary | ICD-10-CM | POA: Diagnosis not present

## 2024-01-05 DIAGNOSIS — D509 Iron deficiency anemia, unspecified: Secondary | ICD-10-CM

## 2024-01-05 DIAGNOSIS — D649 Anemia, unspecified: Secondary | ICD-10-CM | POA: Diagnosis not present

## 2024-01-05 DIAGNOSIS — E611 Iron deficiency: Secondary | ICD-10-CM

## 2024-01-05 DIAGNOSIS — Z7902 Long term (current) use of antithrombotics/antiplatelets: Secondary | ICD-10-CM | POA: Diagnosis not present

## 2024-01-05 DIAGNOSIS — I10 Essential (primary) hypertension: Secondary | ICD-10-CM | POA: Diagnosis not present

## 2024-01-05 DIAGNOSIS — I6529 Occlusion and stenosis of unspecified carotid artery: Secondary | ICD-10-CM | POA: Diagnosis not present

## 2024-01-05 DIAGNOSIS — E1142 Type 2 diabetes mellitus with diabetic polyneuropathy: Secondary | ICD-10-CM | POA: Diagnosis not present

## 2024-01-05 LAB — CBC WITH DIFFERENTIAL (CANCER CENTER ONLY)
Abs Immature Granulocytes: 0.1 K/uL — ABNORMAL HIGH (ref 0.00–0.07)
Basophils Absolute: 0.1 K/uL (ref 0.0–0.1)
Basophils Relative: 1 %
Eosinophils Absolute: 0.3 K/uL (ref 0.0–0.5)
Eosinophils Relative: 3 %
HCT: 30.4 % — ABNORMAL LOW (ref 39.0–52.0)
Hemoglobin: 10.1 g/dL — ABNORMAL LOW (ref 13.0–17.0)
Immature Granulocytes: 1 %
Lymphocytes Relative: 10 %
Lymphs Abs: 1 K/uL (ref 0.7–4.0)
MCH: 32.7 pg (ref 26.0–34.0)
MCHC: 33.2 g/dL (ref 30.0–36.0)
MCV: 98.4 fL (ref 80.0–100.0)
Monocytes Absolute: 1.3 K/uL — ABNORMAL HIGH (ref 0.1–1.0)
Monocytes Relative: 13 %
Neutro Abs: 7.5 K/uL (ref 1.7–7.7)
Neutrophils Relative %: 72 %
Platelet Count: 253 K/uL (ref 150–400)
RBC: 3.09 MIL/uL — ABNORMAL LOW (ref 4.22–5.81)
RDW: 16.8 % — ABNORMAL HIGH (ref 11.5–15.5)
WBC Count: 10.3 K/uL (ref 4.0–10.5)
nRBC: 0 % (ref 0.0–0.2)

## 2024-01-05 LAB — IRON AND TIBC
Iron: 58 ug/dL (ref 45–182)
Saturation Ratios: 18 % (ref 17.9–39.5)
TIBC: 326 ug/dL (ref 250–450)
UIBC: 268 ug/dL

## 2024-01-05 LAB — FERRITIN: Ferritin: 228 ng/mL (ref 24–336)

## 2024-01-05 NOTE — Patient Instructions (Signed)
  Randy Jacobson , Thank you for taking time to come for your Medicare Wellness Visit. I appreciate your ongoing commitment to your health goals. Please review the following plan we discussed and let me know if I can assist you in the future.   These are the goals we discussed:  Goals      Exercise 3x per week (30 min per time)     Walking half a mile daily, keeping busy with work/volunteering.     VBCI Transitions of Care (TOC) Care Plan     11/11/23-Patient refused further follow up.  Problems:  Recent Hospitalization for treatment of Osteomyelitis of right foot Knowledge Deficit Related to Osteomyelitis of rt foot  Goal:  Over the next 30 days, the patient will not experience hospital readmission Patient will have any falls  Interventions:  Transitions of Care: Doctor Visits  - discussed the importance of doctor visits  Patient Self Care Activities:  Attend all scheduled provider appointments Call pharmacy for medication refills 3-7 days in advance of running out of medications Call provider office for new concerns or questions  Notify RN Care Manager of TOC call rescheduling needs Participate in Transition of Care Program/Attend TOC scheduled calls Take medications as prescribed   Activity: 50%, partial weightbearing in the right foot with the surgical boot on at all times when you   are out of the bed.   Full fall precautions use walker/cane & assistance as needed  Monitor CBG AC  Plan:  An initial telephone outreach has been scheduled for: 11/11/2023 Next PCP appointment scheduled for: 11/14/2023 Telephone follow up appointment with care management team member scheduled for:  08/012025 Randy Jacobson 1:00 PM        This is a list of the screening recommended for you and due dates:  Health Maintenance  Topic Date Due   Eye exam for diabetics  Never done   DTaP/Tdap/Td vaccine (2 - Tdap) 03/12/2013   COVID-19 Vaccine (4 - Mixed Product risk 2024-25 season) 12/12/2023    Hemoglobin A1C  04/29/2024   Complete foot exam   05/17/2024   Yearly kidney health urinalysis for diabetes  11/13/2024   Yearly kidney function blood test for diabetes  11/23/2024   Medicare Annual Wellness Visit  01/04/2025   Pneumococcal Vaccine for age over 53  Completed   Flu Shot  Completed   Zoster (Shingles) Vaccine  Completed   HPV Vaccine  Aged Out   Meningitis B Vaccine  Aged Out   Colon Cancer Screening  Discontinued   Hepatitis C Screening  Discontinued

## 2024-01-05 NOTE — Progress Notes (Signed)
 Subjective:   SACRAMENTO MONDS is a 78 y.o. male who presents for Medicare Annual/Subsequent preventive examination.  Visit Complete: In person  Patient Medicare AWV questionnaire was completed by the patient on 01/05/24; I have confirmed that all information answered by patient is correct and no changes since this date.  Cardiac Risk Factors include: advanced age (>36men, >62 women);diabetes mellitus;dyslipidemia;family history of premature cardiovascular disease;hypertension     Objective:    Today's Vitals   01/05/24 1019  BP: 112/80  Pulse: 80  Temp: 99 F (37.2 C)  SpO2: 96%  Weight: 181 lb 6.4 oz (82.3 kg)  Height: 5' 10 (1.778 m)   Body mass index is 26.03 kg/m.     11/24/2023   10:49 AM 10/17/2023    5:34 AM 10/06/2023   10:31 AM 09/07/2023   10:30 AM 08/03/2023    1:18 PM 06/23/2023    3:19 PM 06/20/2023    5:49 AM  Advanced Directives  Does Patient Have a Medical Advance Directive? Yes Yes No Yes No Yes Yes  Type of Estate agent of [employer] Power of [place];Living will Healthcare Power of [place];Living will Healthcare Power of [place];Living will  Healthcare Power of [place];Living will Healthcare Power of [place];Living will  Does patient want to make changes to medical advance directive? No - Patient declined No - Patient declined No - Patient declined No - Patient declined No - Patient declined No - Patient declined No - Guardian declined  Copy of Healthcare Power of Attorney in Chart?   No - copy requested No - copy requested  No - copy requested, Physician notified No - copy requested  Would patient like information on creating a medical advance directive?     No - Patient declined      Current Medications (verified) Outpatient Encounter Medications as of 01/05/2024  Medication Sig   acetaminophen  (TYLENOL ) 500 MG tablet Take 1 tablet (500 mg total) by mouth every 8 (eight) hours as needed for mild pain (pain score 1-3) or  fever.   albuterol  (VENTOLIN  HFA) 108 (90 Base) MCG/ACT inhaler Inhale 2 puffs into the lungs every 6 (six) hours as needed for wheezing or shortness of breath.   amLODipine  (NORVASC ) 10 MG tablet Take 1 tablet (10 mg total) by mouth daily.   Ascorbic Acid (VITAMIN C ) 1000 MG tablet Take 1 tablet (1,000 mg total) by mouth daily.   aspirin  EC 81 MG tablet Take 1 tablet (81 mg total) by mouth daily at 6 (six) AM. Swallow whole.   atorvastatin  (LIPITOR) 40 MG tablet Take 1 tablet by mouth once daily   clopidogrel  (PLAVIX ) 75 MG tablet Take 1 tablet by mouth once daily   Continuous Glucose Sensor (FREESTYLE LIBRE 2 SENSOR) MISC 2 Devices by Does not apply route daily. E11.69   ferrous sulfate  325 (65 FE) MG tablet Take 1 tablet (325 mg total) by mouth daily with breakfast.   gabapentin  (NEURONTIN ) 100 MG capsule Take 1 capsule (100 mg total) by mouth 3 (three) times daily.   hydrochlorothiazide  (HYDRODIURIL ) 25 MG tablet Take 0.5 tablets (12.5 mg total) by mouth daily.   losartan  (COZAAR ) 50 MG tablet Take 1 tablet (50 mg total) by mouth in the morning and at bedtime.   Melatonin 10 MG TABS Take 10 mg by mouth at bedtime.   metFORMIN  (GLUCOPHAGE ) 1000 MG tablet TAKE 1 TABLET BY MOUTH TWICE DAILY WITH A MEAL   pantoprazole  (PROTONIX ) 40 MG tablet Take 1 tablet (40 mg  total) by mouth daily.   silver sulfADIAZINE (SILVADENE) 1 % cream Apply 1 Application topically daily.   sitaGLIPtin  (JANUVIA ) 50 MG tablet Take 1 tablet (50 mg total) by mouth daily as needed (for high blood sugar).   TRESIBA  FLEXTOUCH 100 UNIT/ML FlexTouch Pen Inject 38 Units into the skin daily.   HYDROcodone -acetaminophen  (NORCO/VICODIN) 5-325 MG tablet Take 1 tablet by mouth every 6 (six) hours as needed for severe pain (pain score 7-10).   No facility-administered encounter medications on file as of 01/05/2024.    Allergies (verified) Patient has no known allergies.   History: Past Medical History:  Diagnosis Date    Diabetes mellitus without complication (HCC)    Dysrhythmia    chronic RBBB; bradycardia s/p atropine  following sheath removal, 3.8 second sinus pause 10/11/22   Hypertension    Peripheral arterial disease    Peripheral artery disease    Past Surgical History:  Procedure Laterality Date   stents in both my legs     ABDOMINAL AORTOGRAM W/LOWER EXTREMITY N/A 10/11/2022   Procedure: ABDOMINAL AORTOGRAM W/LOWER EXTREMITY;  Surgeon: Eliza Lonni RAMAN, MD;  Location[facility] INVASIVE CV LAB;  Service: Cardiovascular;  Laterality: N/A;   ABDOMINAL AORTOGRAM W/LOWER EXTREMITY Left 06/20/2023   Procedure: ABDOMINAL AORTOGRAM W/LOWER EXTREMITY;  Surgeon: Pearline Norman RAMAN, MD;  Location: [facility] INVASIVE CV LAB;  Service: Cardiovascular;  Laterality: Left;   ABDOMINAL AORTOGRAM W/LOWER EXTREMITY N/A 10/17/2023   Procedure: ABDOMINAL AORTOGRAM W/LOWER EXTREMITY;  Surgeon: Pearline Norman RAMAN, MD;  Location: [facility] INVASIVE CV LAB;  Service: Cardiovascular;  Laterality: N/A;   AMPUTATION Right 10/29/2023   Procedure: AMPUTATION, FOOT, RAY;  Surgeon: Janit Thresa HERO, DPM;  Location: [facility] OR;  Service: Orthopedics/Podiatry;  Laterality: Right;  PARTIAL FIFTH RAY AMPUTATION   COLONOSCOPY WITH PROPOFOL  N/A 01/18/2023   Procedure: COLONOSCOPY WITH PROPOFOL ;  Surgeon: Saintclair Jasper, MD;  Location: [facility] ENDOSCOPY;  Service: Gastroenterology;  Laterality: N/A;   ENDARTERECTOMY FEMORAL Left 10/15/2022   Procedure: ENDARTERECTOMY OF LEFT ILIOFEMORAL ARTERY;  Surgeon: Eliza Lonni RAMAN, MD;  Location: [facility] OR;  Service: Vascular;  Laterality: Left;   ENDARTERECTOMY FEMORAL Right 06/23/2023   Procedure: COMMON FEMORAL ENDARTERECTOMY;  Surgeon: Pearline Norman RAMAN, MD;  Location: [facility] OR;  Service: Vascular;  Laterality: Right;   ESOPHAGOGASTRODUODENOSCOPY (EGD) WITH PROPOFOL  N/A 01/18/2023   Procedure: ESOPHAGOGASTRODUODENOSCOPY (EGD) WITH PROPOFOL ;  Surgeon: Saintclair Jasper, MD;  Location: [facility] ENDOSCOPY;  Service: Gastroenterology;  Laterality: N/A;    FEMORAL-POPLITEAL BYPASS GRAFT Left 10/15/2022   Procedure: LEFT FEMORAL-BELOW KNEE POPLITEAL ARTERY BYPASS WITH VEIN HARVEST OF LEFT GREATER SAPHENOUS VEIN;  Surgeon: Eliza Lonni RAMAN, MD;  Location: [facility] OR;  Service: Vascular;  Laterality: Left;   GIVENS CAPSULE STUDY N/A 04/10/2017   Procedure: GIVENS CAPSULE STUDY;  Surgeon: Dianna Specking, MD;  Location: [facility] ENDOSCOPY;  Service: Endoscopy;  Laterality: N/A;   GIVENS CAPSULE STUDY N/A 01/18/2023   Procedure: GIVENS CAPSULE STUDY;  Surgeon: Saintclair Jasper, MD;  Location: [facility] ENDOSCOPY;  Service: Gastroenterology;  Laterality: N/A;   INSERTION OF ILIAC STENT Right 06/23/2023   Procedure: INSERTION, STENT, ARTERY, ILIAC;  Surgeon: Pearline Norman RAMAN, MD;  Location: [facility] OR;  Service: Vascular;  Laterality: Right;   IR GENERIC HISTORICAL  08/26/2015   IR RADIOLOGIST EVAL & MGMT 08/26/2015 Wilkie Lent, MD [facility] INTERV RAD   IR GENERIC HISTORICAL  04/21/2016   IR RADIOLOGIST EVAL & MGMT 04/21/2016 Wilkie Lent, MD [facility] INTERV RAD   LOWER EXTREMITY ANGIOGRAM Right 06/23/2023   Procedure: ANGIOGRAM, LOWER EXTREMITY;  Surgeon: Pearline Norman RAMAN, MD;  Location[facility] OR;  Service: Vascular;  Laterality: Right;   LOWER EXTREMITY INTERVENTION Left 06/20/2023   Procedure: LOWER EXTREMITY INTERVENTION;  Surgeon: Pearline Norman RAMAN, MD;  Location: [facility] INVASIVE CV LAB;  Service: Cardiovascular;  Laterality: Left;  DCB   LOWER EXTREMITY INTERVENTION N/A 10/17/2023   Procedure: LOWER EXTREMITY INTERVENTION;  Surgeon: Pearline Norman RAMAN, MD;  Location: [facility] INVASIVE CV LAB;  Service: Cardiovascular;  Laterality: N/A;   PATCH ANGIOPLASTY Left 10/15/2022   Procedure: PATCH ANGIOPLASTY USING XENOSURE BIOLOGIC PATCH 1CMX14CM;  Surgeon: Eliza Lonni RAMAN, MD;  Location: [facility] OR;  Service: Vascular;  Laterality: Left;   PATCH ANGIOPLASTY Right 06/23/2023   Procedure: ANGIOPLASTY, USING PATCH GRAFT 6CM GEORGE;  Surgeon: Pearline Norman RAMAN, MD;  Location: [facility] OR;  Service:  Vascular;  Laterality: Right;   PERIPHERAL VASCULAR INTERVENTION  10/11/2022   Procedure: PERIPHERAL VASCULAR INTERVENTION;  Surgeon: Eliza Lonni RAMAN, MD;  Location: [facility] INVASIVE CV LAB;  Service: Cardiovascular;;   Family History  Problem Relation Age of Onset   Diabetes Brother    Social History   Socioeconomic History   Marital status: Significant Other    Spouse name: Not on file   Number of children: Not on file   Years of education: Not on file   Highest education level: Not on file  Occupational History   Not on file  Tobacco Use   Smoking status: Former    Current packs/day: 0.00    Average packs/day: 1 pack/day for 36.0 years (36.0 ttl pk-yrs)    Types: Cigarettes    Start date: 11/20/1957    Quit date: 11/20/1993    Years since quitting: 30.1   Smokeless tobacco: Never  Vaping Use   Vaping status: Never Used  Substance and Sexual Activity   Alcohol use: Yes    Alcohol/week: 0.0 standard drinks of alcohol    Comment: occ   Drug use: Never   Sexual activity: Not on file  Other Topics Concern   Not on file  Social History Narrative   Not on file   Social Drivers of Health   Financial Resource Strain: Low Risk  (01/29/2022)   Received from [facility]   Overall Financial Resource Strain (CARDIA)    Difficulty of Paying Living Expenses: Not hard at all  Food Insecurity: No Food Insecurity (11/01/2023)   Hunger Vital Sign    Worried About Running Out of Food in the Last Year: Never true    Ran Out of Food in the Last Year: Never true  Transportation Needs: No Transportation Needs (11/01/2023)   PRAPARE - Administrator, Civil Service (Medical): No    Lack of Transportation (Non-Medical): No  Physical Activity: Not on file  Stress: No Stress Concern Present (01/29/2022)   Received from [facility] - Occupational Stress Questionnaire    Feeling of Stress : Not at all  Social Connections:  Moderately Isolated (10/30/2023)   Social Connection and Isolation Panel    Frequency of Communication with Friends and Family: Twice a week    Frequency of Social Gatherings with Friends and Family: Once a week    Attends Religious Services: Never    Database administrator or Organizations: No    Attends Banker Meetings: Never    Marital Status: Living with partner    Tobacco Counseling Counseling given: Not Answered   Clinical Intake:  Pre-visit preparation completed: No  Pain : No/denies pain     BMI - recorded: 25.51 Nutritional Status: BMI 25 -29 Overweight Nutritional Risks: None Diabetes: Yes CBG done?: Yes (115) CBG resulted in Enter/ Edit results?: Yes Did pt. bring in CBG monitor from home?: Yes Glucose Meter Downloaded?: No  How often do you need to have someone help you when you read instructions, pamphlets, or other written materials from your doctor or pharmacy?: 1 - Never What is the last grade level you completed in school?: 3.5 years college  Interpreter Needed?: No  Information entered by :: Jaaliyah Lucatero Medina-Vargas  NP   Activities of Daily Living    01/05/2024   10:41 AM 01/16/2023    9:00 PM  In your present state of health, do you have any difficulty performing the following activities:  Hearing? 0 0  Vision? 0 0  Difficulty concentrating or making decisions? 0 1  Walking or climbing stairs? 0   Doing errands, shopping? 0   Preparing Food and eating ? N   Using the Toilet? N   In the past six months, have you accidently leaked urine? N   Do you have problems with loss of bowel control? N   Managing your Medications? N   Managing your Finances? N   Housekeeping or managing your Housekeeping? N     Patient Care Team: Medina-Vargas, Chinedum Vanhouten C, NP as PCP - General (Internal Medicine)  Indicate any recent Medical Services you may have received from other than [facility] providers in the past year (date may be approximate).      Assessment:   This is a routine wellness examination for [place].  Hearing/Vision screen Hearing Screening - Comments:: No hearing issues   Goals Addressed             This Visit's Progress    Exercise 3x per week (30 min per time)       Walking half a mile daily, keeping busy with work/volunteering.       Depression Screen    01/05/2024   10:22 AM 11/01/2023    1:13 PM 09/07/2023   10:29 AM 05/23/2023    2:34 PM 05/18/2023   10:01 AM 03/17/2023    1:08 PM  PHQ 2/9 Scores  PHQ - 2 Score 0 0 0 0 0 0  PHQ- 9 Score   0       Fall Risk    01/05/2024   10:22 AM 09/07/2023   10:29 AM 05/18/2023   10:00 AM 03/17/2023    1:08 PM  Fall Risk   Falls in the past year? 0 0 0 0  Number falls in past yr: 0 0 0 0  Injury with Fall?  0 0 0  Risk for fall due to : Orthopedic patient History of fall(s) No Fall Risks No Fall Risks  Follow up Falls evaluation completed Falls evaluation completed Falls evaluation completed Falls evaluation completed    MEDICARE RISK AT HOME:    TIMED UP AND GO:  Was the test performed?  Yes  Length of time to ambulate 10 feet: 10  sec Gait steady and fast without use of assistive device    Cognitive Function:        01/05/2024   10:22 AM  6CIT Screen  What Year? 0 points  What month? 0 points  What time? 0 points  Count back from 20 0 points  Months in reverse 4 points  Repeat phrase 2 points  Total Score 6 points  Immunizations Immunization History  Administered Date(s) Administered   Fluad Quad(high Dose 65+) 12/28/2022   H1N1 02/22/2008   INFLUENZA, HIGH DOSE SEASONAL PF 01/04/2024   Influenza Split 01/08/2008   Influenza-Unspecified 03/13/2015   Moderna Covid-19 Fall Seasonal Vaccine 42yrs & older 12/28/2022   Novel Infuenza-h1n1-09 02/22/2008   PFIZER(Purple Top)SARS-COV-2 Vaccination 06/10/2019, 07/10/2019   PNEUMOCOCCAL CONJUGATE-20 01/05/2024   Pneumococcal Polysaccharide-23 06/11/2006   RSV,unspecified 03/20/2022   Td  03/13/2003   Zoster Recombinant(Shingrix) 12/08/2017, 03/24/2018    TDAP status: Due, Education has been provided regarding the importance of this vaccine. Advised may receive this vaccine at local pharmacy or Health Dept. Aware to provide a copy of the vaccination record if obtained from local pharmacy or Health Dept. Verbalized acceptance and understanding.  Flu Vaccine status: Up to date  Pneumococcal vaccine status: Due, Education has been provided regarding the importance of this vaccine. Advised may receive this vaccine at local pharmacy or Health Dept. Aware to provide a copy of the vaccination record if obtained from local pharmacy or Health Dept. Verbalized acceptance and understanding.  Covid-19 vaccine status: Completed vaccines  Qualifies for Shingles Vaccine? Yes   Zostavax completed Yes   Shingrix Completed?: Yes  Screening Tests Health Maintenance  Topic Date Due   OPHTHALMOLOGY EXAM  Never done   DTaP/Tdap/Td (2 - Tdap) 03/12/2013   COVID-19 Vaccine (4 - Mixed Product risk 2024-25 season) 12/12/2023   HEMOGLOBIN A1C  04/29/2024   FOOT EXAM  05/17/2024   Diabetic kidney evaluation - Urine ACR  11/13/2024   Diabetic kidney evaluation - eGFR measurement  11/23/2024   Medicare Annual Wellness (AWV)  01/04/2025   Pneumococcal Vaccine: 50+ Years  Completed   Influenza Vaccine  Completed   Zoster Vaccines- Shingrix  Completed   HPV VACCINES  Aged Out   Meningococcal B Vaccine  Aged Out   Colonoscopy  Discontinued   Hepatitis C Screening  Discontinued    Health Maintenance  Health Maintenance Due  Topic Date Due   OPHTHALMOLOGY EXAM  Never done   DTaP/Tdap/Td (2 - Tdap) 03/12/2013   COVID-19 Vaccine (4 - Mixed Product risk 2024-25 season) 12/12/2023    Colorectal cancer screening: Type of screening: Colonoscopy. Completed 01/18/23. Repeat every 6 months years  Lung Cancer Screening: (Low Dose CT Chest recommended if Age 81-80 years, 20 pack-year currently smoking  OR have quit w/in 15years.) does not qualify.   Lung Cancer Screening Referral: N/A  Additional Screening:  Hepatitis C Screening: does qualify; Completed 03/17/23  Vision Screening: Recommended annual ophthalmology exams for early detection of glaucoma and other disorders of the eye. Is the patient up to date with their annual eye exam?  Yes  Who is the provider or what is the name of the office in which the patient attends annual eye exams? [facility] If pt is not established with a provider, would they like to be referred to a provider to establish care? No .   Dental Screening: Recommended annual dental exams for proper oral hygiene  Diabetic Foot Exam: Diabetic Foot Exam: Completed 12/07/23  Community Resource Referral / Chronic Care Management: CRR required this visit?  No   CCM required this visit?  No     Plan:     I have personally reviewed and noted the following in the patient's chart:   Medical and social history Use of alcohol, tobacco or illicit drugs  Current medications and supplements including opioid prescriptions. Patient is currently taking opioid prescriptions. Information provided  to patient regarding non-opioid alternatives. Patient advised to discuss non-opioid treatment plan with their provider. Functional ability and status Nutritional status Physical activity Advanced directives List of other physicians Hospitalizations, surgeries, and ER visits in previous 12 months Vitals Screenings to include cognitive, depression, and falls Referrals and appointments  In addition, I have reviewed and discussed with patient certain preventive protocols, quality metrics, and best practice recommendations. A written personalized care plan for preventive services as well as general preventive health recommendations were provided to patient.     Tashima Scarpulla Medina-Vargas, NP   01/05/2024   After Visit Summary: (In Person-Printed) AVS printed and given to  the patient  Nurse Notes:  Please schedule an annual wellness visit for next year.

## 2024-01-05 NOTE — Progress Notes (Signed)
  [facility] OFFICE PROGRESS NOTE   Diagnosis: Iron  deficiency anemia  INTERVAL HISTORY:   Randy Jacobson returns for follow-up.  He continues oral iron .  He completed Feraheme  infusions 12/01/2023 and 12/08/2023.  He reports tolerating well, no sign of an allergic reaction.  He denies any bleeding.  Stools are black due to oral iron .  No constipation or nausea.  Objective:  Vital signs in last 24 hours:  Blood pressure (!) 112/55, pulse 92, temperature 98.4 F (36.9 C), temperature source Temporal, resp. rate 18, height 5' 10 (1.778 m), weight 181 lb (82.1 kg), SpO2 97%.    HEENT: No thrush or ulcers. Resp: Distant breath sounds.  No respiratory distress. Cardio: Regular rate and rhythm. GI: No hepatosplenomegaly. Vascular: No leg edema.   Lab Results:  Lab Results  Component Value Date   WBC 10.3 01/05/2024   HGB 10.1 (L) 01/05/2024   HCT 30.4 (L) 01/05/2024   MCV 98.4 01/05/2024   PLT 253 01/05/2024   NEUTROABS 7.5 01/05/2024    Imaging:  No results found.  Medications: I have reviewed the patient's current medications.  Assessment/Plan: Anemia Chronic anemia with intermittent Red cell microcytosis Documented iron  deficiency on repeat ferritin testing including 05/23/2023 He has received Feraheme  and Venofer  in the past 01/16/2023 fecal occult blood positive; negative GI workup for a source of blood loss including upper endoscopy, colonoscopy, and small bowel camera endoscopy 01/18/2023 Feraheme  05/26/2023, 06/02/2023 10/06/2023 normal B12, DAT negative, myeloma panel negative Positive stool cards x 3 10/20/2023, 10/22/2023, 10/25/2023 Peripheral blood smear reviewed 11/03/2023-consistent with ongoing iron  deficiency Feraheme  12/01/2023, 12/08/2023 History of iron  deficiency anemia in 2018 Gastric ulcers 2018 4.   Diabetes 5.   Hypertension 6.   Peripheral vascular disease-maintained on aspirin  and Plavix  7.   Carotid artery stenosis 8.   Peripheral  neuropathy  Disposition: Mr. Randy Jacobson appears stable.  He tolerated the IV iron  last month well.  We reviewed the CBC from today.  Hemoglobin is unchanged, stable at 10.  We will follow-up on the ferritin and iron  studies.  We discussed returning for lab and follow-up in 6 weeks.  He agrees to return in 8 weeks.    Randy Jacobson ANP/GNP-BC   01/05/2024  2:11 PM

## 2024-01-06 ENCOUNTER — Ambulatory Visit (HOSPITAL_BASED_OUTPATIENT_CLINIC_OR_DEPARTMENT_OTHER)
Admission: RE | Admit: 2024-01-06 | Discharge: 2024-01-06 | Disposition: A | Discharge status: Home / Self Care | Source: Ambulatory Visit | Attending: Vascular Surgery | Admitting: Vascular Surgery

## 2024-01-06 ENCOUNTER — Ambulatory Visit (HOSPITAL_COMMUNITY)
Admission: RE | Admit: 2024-01-06 | Discharge: 2024-01-06 | Disposition: A | Discharge status: Home / Self Care | Source: Ambulatory Visit | Attending: Vascular Surgery | Admitting: Vascular Surgery

## 2024-01-06 ENCOUNTER — Ambulatory Visit (INDEPENDENT_AMBULATORY_CARE_PROVIDER_SITE_OTHER): Admitting: Physician Assistant

## 2024-01-06 ENCOUNTER — Telehealth: Payer: Self-pay | Admitting: Oncology

## 2024-01-06 VITALS — BP 145/64 | HR 73 | Temp 98.1°F | Resp 18 | Ht 70.0 in | Wt 180.7 lb

## 2024-01-06 DIAGNOSIS — I739 Peripheral vascular disease, unspecified: Secondary | ICD-10-CM

## 2024-01-06 DIAGNOSIS — I70221 Atherosclerosis of native arteries of extremities with rest pain, right leg: Secondary | ICD-10-CM | POA: Diagnosis not present

## 2024-01-06 LAB — VAS US ABI WITH/WO TBI
Left ABI: 0.99
Right ABI: 0.76

## 2024-01-06 NOTE — Progress Notes (Signed)
 Office Note     CC:  follow up Requesting Provider:  Phyllis Jereld BROCKS*  HPI: Randy Jacobson is a 78 y.o. (1945/11/17) male who presents status post aortogram with right SFA and popliteal shockwave lithotripsy and stenting as well as proximal SFA drug-coated balloon angioplasty by Dr. 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  He subsequently underwent right fifth toe ray amputation by podiatrist Dr. Janit on 10/29/2023.  He has since healed his toe amputation.  He denies any ongoing pain or discomfort in the left groin access site.  He continues to take his aspirin , Plavix , statin daily.  He denies tobacco use.   Past Medical History:  Diagnosis Date   Diabetes mellitus without complication (HCC)    Dysrhythmia    chronic RBBB; bradycardia s/p atropine  following sheath removal, 3.8 second sinus pause 10/11/22   Hypertension    Peripheral arterial disease    Peripheral artery disease     Past Surgical History:  Procedure Laterality Date   stents in both my legs     ABDOMINAL AORTOGRAM W/LOWER EXTREMITY N/A 10/11/2022   Procedure: ABDOMINAL AORTOGRAM W/LOWER EXTREMITY;  Surgeon: Eliza Lonni RAMAN, MD;  Location: [facility] INVASIVE CV LAB;  Service: Cardiovascular;  Laterality: N/A;   ABDOMINAL AORTOGRAM W/LOWER EXTREMITY Left 06/20/2023   Procedure: ABDOMINAL AORTOGRAM W/LOWER EXTREMITY;  Surgeon: Pearline Norman RAMAN, MD;  Location: [facility] INVASIVE CV LAB;  Service: Cardiovascular;  Laterality: Left;   ABDOMINAL AORTOGRAM W/LOWER EXTREMITY N/A 10/17/2023   Procedure: ABDOMINAL AORTOGRAM W/LOWER EXTREMITY;  Surgeon: Pearline Norman RAMAN, MD;  Location: [facility] INVASIVE CV LAB;  Service: Cardiovascular;  Laterality: N/A;   AMPUTATION Right 10/29/2023   Procedure: AMPUTATION, FOOT, RAY;  Surgeon: Janit Thresa HERO, DPM;  Location: [facility] OR;  Service: Orthopedics/Podiatry;  Laterality: Right;  PARTIAL FIFTH RAY AMPUTATION   COLONOSCOPY WITH PROPOFOL  N/A 01/18/2023    Procedure: COLONOSCOPY WITH PROPOFOL ;  Surgeon: Saintclair Jasper, MD;  Location: [facility] ENDOSCOPY;  Service: Gastroenterology;  Laterality: N/A;   ENDARTERECTOMY FEMORAL Left 10/15/2022   Procedure: ENDARTERECTOMY OF LEFT ILIOFEMORAL ARTERY;  Surgeon: Eliza Lonni RAMAN, MD;  Location: [facility] OR;  Service: Vascular;  Laterality: Left;   ENDARTERECTOMY FEMORAL Right 06/23/2023   Procedure: COMMON FEMORAL ENDARTERECTOMY;  Surgeon: Pearline Norman RAMAN, MD;  Location: [facility] OR;  Service: Vascular;  Laterality: Right;   ESOPHAGOGASTRODUODENOSCOPY (EGD) WITH PROPOFOL  N/A 01/18/2023   Procedure: ESOPHAGOGASTRODUODENOSCOPY (EGD) WITH PROPOFOL ;  Surgeon: Saintclair Jasper, MD;  Location: [facility] ENDOSCOPY;  Service: Gastroenterology;  Laterality: N/A;   FEMORAL-POPLITEAL BYPASS GRAFT Left 10/15/2022   Procedure: LEFT FEMORAL-BELOW KNEE POPLITEAL ARTERY BYPASS WITH VEIN HARVEST OF LEFT GREATER SAPHENOUS VEIN;  Surgeon: Eliza Lonni RAMAN, MD;  Location: [facility] OR;  Service: Vascular;  Laterality: Left;   GIVENS CAPSULE STUDY N/A 04/10/2017   Procedure: GIVENS CAPSULE STUDY;  Surgeon: Dianna Specking, MD;  Location: [facility] ENDOSCOPY;  Service: Endoscopy;  Laterality: N/A;   GIVENS CAPSULE STUDY N/A 01/18/2023   Procedure: GIVENS CAPSULE STUDY;  Surgeon: Saintclair Jasper, MD;  Location: [facility] ENDOSCOPY;  Service: Gastroenterology;  Laterality: N/A;   INSERTION OF ILIAC STENT Right 06/23/2023   Procedure: INSERTION, STENT, ARTERY, ILIAC;  Surgeon: Pearline Norman RAMAN, MD;  Location: [facility] OR;  Service: Vascular;  Laterality: Right;   IR GENERIC HISTORICAL  08/26/2015   IR RADIOLOGIST EVAL & MGMT 08/26/2015 Wilkie Lent, MD [facility] INTERV RAD   IR GENERIC HISTORICAL  04/21/2016   IR RADIOLOGIST EVAL & MGMT 04/21/2016 Heath  Karalee, MD [facility] INTERV RAD   LOWER EXTREMITY ANGIOGRAM Right 06/23/2023   Procedure: GERALYN, LOWER EXTREMITY;  Surgeon: Pearline Norman RAMAN, MD;  Location: [facility] OR;  Service: Vascular;  Laterality: Right;   LOWER EXTREMITY INTERVENTION  Left 06/20/2023   Procedure: LOWER EXTREMITY INTERVENTION;  Surgeon: Pearline Norman RAMAN, MD;  Location: [facility] INVASIVE CV LAB;  Service: Cardiovascular;  Laterality: Left;  DCB   LOWER EXTREMITY INTERVENTION N/A 10/17/2023   Procedure: LOWER EXTREMITY INTERVENTION;  Surgeon: Pearline Norman RAMAN, MD;  Location: [facility] INVASIVE CV LAB;  Service: Cardiovascular;  Laterality: N/A;   PATCH ANGIOPLASTY Left 10/15/2022   Procedure: PATCH ANGIOPLASTY USING XENOSURE BIOLOGIC PATCH 1CMX14CM;  Surgeon: Eliza Lonni RAMAN, MD;  Location: [facility] OR;  Service: Vascular;  Laterality: Left;   PATCH ANGIOPLASTY Right 06/23/2023   Procedure: ANGIOPLASTY, USING PATCH GRAFT 6CM GEORGE;  Surgeon: Pearline Norman RAMAN, MD;  Location: [facility] OR;  Service: Vascular;  Laterality: Right;   PERIPHERAL VASCULAR INTERVENTION  10/11/2022   Procedure: PERIPHERAL VASCULAR INTERVENTION;  Surgeon: Eliza Lonni RAMAN, MD;  Location: [facility] INVASIVE CV LAB;  Service: Cardiovascular;;    Social History   Socioeconomic History   Marital status: Significant Other    Spouse name: Not on file   Number of children: Not on file   Years of education: Not on file   Highest education level: Not on file  Occupational History   Not on file  Tobacco Use   Smoking status: Former    Current packs/day: 0.00    Average packs/day: 1 pack/day for 36.0 years (36.0 ttl pk-yrs)    Types: Cigarettes    Start date: 11/20/1957    Quit date: 11/20/1993    Years since quitting: 30.1   Smokeless tobacco: Never  Vaping Use   Vaping status: Never Used  Substance and Sexual Activity   Alcohol use: Yes    Alcohol/week: 0.0 standard drinks of alcohol    Comment: occ   Drug use: Never   Sexual activity: Not on file  Other Topics Concern   Not on file  Social History Narrative   Not on file   Social Drivers of Health   Financial Resource Strain: Low Risk  (01/29/2022)   Received from [facility]   Overall Financial Resource Strain (CARDIA)    Difficulty of  Paying Living Expenses: Not hard at all  Food Insecurity: No Food Insecurity (11/01/2023)   Hunger Vital Sign    Worried About Running Out of Food in the Last Year: Never true    Ran Out of Food in the Last Year: Never true  Transportation Needs: No Transportation Needs (11/01/2023)   PRAPARE - Administrator, Civil Service (Medical): No    Lack of Transportation (Non-Medical): No  Physical Activity: Not on file  Stress: No Stress Concern Present (01/29/2022)   Received from [facility] Occup[facility] - Occupational Stress Questionnaire    Feeling of Stress : Not at all  Social Connections: Moderately Isolated (10/30/2023)   Social Connection and Isolation Panel    Frequency of Communication with Friends and Family: Twice a week    Frequency of Social Gatherings with Friends and Family: Once a week    Attends Religious Services: Never    Database administrator or Organizations: No    Attends Banker Meetings: Never    Marital Status: Living with partner  Intimate Partner Violence: Not At Risk (11/01/2023)   Humiliation, Afraid, Rape,  and Kick questionnaire    Fear of Current or Ex-Partner: No    Emotionally Abused: No    Physically Abused: No    Sexually Abused: No    Family History  Problem Relation Age of Onset   Diabetes Brother     Current Outpatient Medications  Medication Sig Dispense Refill   acetaminophen  (TYLENOL ) 500 MG tablet Take 1 tablet (500 mg total) by mouth every 8 (eight) hours as needed for mild pain (pain score 1-3) or fever. 20 tablet 0   albuterol  (VENTOLIN  HFA) 108 (90 Base) MCG/ACT inhaler Inhale 2 puffs into the lungs every 6 (six) hours as needed for wheezing or shortness of breath. 8 g 0   amLODipine  (NORVASC ) 10 MG tablet Take 1 tablet (10 mg total) by mouth daily. 90 tablet 1   Ascorbic Acid (VITAMIN C ) 1000 MG tablet Take 1 tablet (1,000 mg total) by mouth daily.     aspirin  EC 81 MG tablet Take  1 tablet (81 mg total) by mouth daily at 6 (six) AM. Swallow whole. 30 tablet 12   atorvastatin  (LIPITOR) 40 MG tablet Take 1 tablet by mouth once daily 90 tablet 1   clopidogrel  (PLAVIX ) 75 MG tablet Take 1 tablet by mouth once daily 90 tablet 0   Continuous Glucose Sensor (FREESTYLE LIBRE 2 SENSOR) MISC 2 Devices by Does not apply route daily. E11.69 2 each 11   ferrous sulfate  325 (65 FE) MG tablet Take 1 tablet (325 mg total) by mouth daily with breakfast. 90 tablet 0   gabapentin  (NEURONTIN ) 100 MG capsule Take 1 capsule (100 mg total) by mouth 3 (three) times daily. 90 capsule 3   hydrochlorothiazide  (HYDRODIURIL ) 25 MG tablet Take 0.5 tablets (12.5 mg total) by mouth daily. 45 tablet 0   HYDROcodone -acetaminophen  (NORCO/VICODIN) 5-325 MG tablet Take 1 tablet by mouth every 6 (six) hours as needed for severe pain (pain score 7-10). 20 tablet 0   losartan  (COZAAR ) 50 MG tablet Take 1 tablet (50 mg total) by mouth in the morning and at bedtime. 180 tablet 1   Melatonin 10 MG TABS Take 10 mg by mouth at bedtime.     metFORMIN  (GLUCOPHAGE ) 1000 MG tablet TAKE 1 TABLET BY MOUTH TWICE DAILY WITH A MEAL 180 tablet 1   pantoprazole  (PROTONIX ) 40 MG tablet Take 1 tablet (40 mg total) by mouth daily. 90 tablet 1   silver sulfADIAZINE (SILVADENE) 1 % cream Apply 1 Application topically daily.     sitaGLIPtin  (JANUVIA ) 50 MG tablet Take 1 tablet (50 mg total) by mouth daily as needed (for high blood sugar). 90 tablet 1   TRESIBA  FLEXTOUCH 100 UNIT/ML FlexTouch Pen Inject 38 Units into the skin daily. 15 mL 5   No current facility-administered medications for this visit.    No Known Allergies   REVIEW OF SYSTEMS:  Negative unless noted in HPI [X]  denotes positive finding, [ ]  denotes negative finding Cardiac  Comments:  Chest pain or chest pressure:    Shortness of breath upon exertion:    Short of breath when lying flat:    Irregular heart rhythm:        Vascular    Pain in calf, thigh, or  hip brought on by ambulation:    Pain in feet at night that wakes you up from your sleep:     Blood clot in your veins:    Leg swelling:         Pulmonary    Oxygen at  home:    Productive cough:     Wheezing:         Neurologic    Sudden weakness in arms or legs:     Sudden numbness in arms or legs:     Sudden onset of difficulty speaking or slurred speech:    Temporary loss of vision in one eye:     Problems with dizziness:         Gastrointestinal    Blood in stool:     Vomited blood:         Genitourinary    Burning when urinating:     Blood in urine:        Psychiatric    Major depression:         Hematologic    Bleeding problems:    Problems with blood clotting too easily:        Skin    Rashes or ulcers:        Constitutional    Fever or chills:      PHYSICAL EXAMINATION:  Vitals:   01/06/24 1416  BP: (!) 145/64  Pulse: 73  Resp: 18  Temp: 98.1 F (36.7 C)  TempSrc: Temporal  SpO2: 96%  Weight: 180 lb 11.2 oz (82 kg)  Height: 5' 10 (1.778 m)    General:  WDWN in NAD; vital signs documented above Gait: Not observed HENT: WNL, normocephalic Pulmonary: normal non-labored breathing Cardiac: regular HR Abdomen: soft, NT, no masses Skin: without rashes Vascular Exam/Pulses: R DP by doppler Extremities: without ischemic changes, without Gangrene , without cellulitis; without open wounds;  Musculoskeletal: no muscle wasting or atrophy  Neurologic: A&O X 3 Psychiatric:  The pt has Normal affect.   Non-Invasive Vascular Imaging:    Widely patent right SFA/popliteal stent  ABI/TBIToday's ABIToday's TBIPrevious ABIPrevious TBI  +-------+-----------+-----------+------------+------------+  Right 0.76       0.22       0.57        0.0           +-------+-----------+-----------+------------+------------+  Left  0.99       0.18       1.41        0.0           +-------+-----------+-----------+------------+------------     ASSESSMENT/PLAN:: 78 y.o. male status post aortogram with right SFA and popliteal shockwave lithotripsy and stenting as well as proximal SFA drug-coated balloon angioplasty due to critical limb ischemia with tissue loss  Based on angiography after the above intervention he had single-vessel runoff to the foot via the peroneal artery.  He subsequently underwent right fifth toe ray amputation which he has since healed.  His left groin access site is well-healed.  Duplex demonstrates a widely patent right SFA/popliteal stent.  He will continue his aspirin , Plavix , statin daily.  We will repeat right lower extremity arterial duplex and ABI in 6 months.  He knows to notify the office if he develops a new wound or rest pain.   Donnice Sender, PA-C [facility] [phone]  Clinic MD:   Gretta on call

## 2024-01-06 NOTE — Telephone Encounter (Signed)
 Called PT to schedule FU appt details. The phone number is not in service.

## 2024-01-09 ENCOUNTER — Other Ambulatory Visit: Payer: Self-pay

## 2024-01-09 DIAGNOSIS — I739 Peripheral vascular disease, unspecified: Secondary | ICD-10-CM

## 2024-02-14 ENCOUNTER — Other Ambulatory Visit: Payer: Self-pay | Admitting: Adult Health

## 2024-02-20 ENCOUNTER — Telehealth: Payer: Self-pay

## 2024-02-20 NOTE — Telephone Encounter (Signed)
 Spoke with patient and responded yes

## 2024-02-20 NOTE — Telephone Encounter (Signed)
 Copied from CRM [id number]. Topic: Clinical - Medication Question >> Feb 20, 2024 11:14 AM Mercer PEDLAR wrote: Reason for CRM: Patient is asking if he has had pneumonia shot and would like a callback.

## 2024-02-23 ENCOUNTER — Other Ambulatory Visit: Payer: Self-pay | Admitting: Oncology

## 2024-02-23 ENCOUNTER — Other Ambulatory Visit: Payer: Self-pay | Admitting: Adult Health

## 2024-03-01 ENCOUNTER — Inpatient Hospital Stay: Admitting: Oncology

## 2024-03-01 ENCOUNTER — Encounter: Payer: Self-pay | Admitting: *Deleted

## 2024-03-01 ENCOUNTER — Inpatient Hospital Stay: Attending: Oncology

## 2024-03-01 NOTE — Progress Notes (Unsigned)
"  No show" for lab/OV today. Scheduling message sent to reschedule. 

## 2024-03-12 ENCOUNTER — Other Ambulatory Visit: Payer: Self-pay | Admitting: Adult Health

## 2024-03-12 DIAGNOSIS — E1169 Type 2 diabetes mellitus with other specified complication: Secondary | ICD-10-CM

## 2024-03-13 ENCOUNTER — Telehealth: Payer: Self-pay

## 2024-03-13 NOTE — Telephone Encounter (Signed)
 Spoke with patient notified that patient assistance medication was received , okay to pick up

## 2024-03-16 ENCOUNTER — Telehealth: Payer: Self-pay | Admitting: Pharmacist

## 2024-03-16 ENCOUNTER — Telehealth: Payer: Self-pay

## 2024-03-16 ENCOUNTER — Encounter: Payer: Self-pay | Admitting: Pharmacist

## 2024-03-16 DIAGNOSIS — E1101 Type 2 diabetes mellitus with hyperosmolarity with coma: Secondary | ICD-10-CM

## 2024-03-16 NOTE — Telephone Encounter (Signed)
 PAP: Patient assistance application for Tresiba  through [employer] has been mailed to pt's home address on file. Provider portion of application will be faxed to provider's office. Patient portion e-filed.

## 2024-03-16 NOTE — Progress Notes (Signed)
 03/16/2024  Patient ID: Randy Jacobson, male   DOB: May 31, 1945, 78 y.o.   MRN: [id number]  Patient called and requested help with his [employer] application or Januvia . HIPAA identifiers were obtained.  Patient is an 78 year old male previously referred for medication assistance help with Januvia . He called today because he received an application from [employer] and was not sure about some of the questions the application was asking.  He has a medical history significant for: type 2 diabetes with history of foot ulcer, hypertension, peripheral artery disease, an anemia.  His medications were reviewed via telephone:  Medications Reviewed Today     Reviewed by Jolee Cassius PARAS, [facility] (Pharmacist) on 03/16/24 at 1048  Med List Status: <None>   Medication Order Taking? Sig Documenting Provider Last Dose Status Informant  acetaminophen  (TYLENOL ) 500 MG tablet [id number] Yes Take 1 tablet (500 mg total) by mouth every 8 (eight) hours as needed for mild pain (pain score 1-3) or fever. Singh, Prashant K, MD  Active   albuterol  (VENTOLIN  HFA) 108 [id number] Base) MCG/ACT inhaler [id number] Yes Inhale 2 puffs into the lungs every 6 (six) hours as needed for wheezing or shortness of breath. Medina-Vargas, Monina C, NP  Active   amLODipine  (NORVASC ) 10 MG tablet [id number] Yes Take 1 tablet (10 mg total) by mouth daily. Medina-Vargas, Monina C, NP  Active   Ascorbic Acid (VITAMIN C ) 1000 MG tablet [id number] Yes Take 1 tablet (1,000 mg total) by mouth daily. Medina-Vargas, Monina C, NP  Active Self, Pharmacy Records, Multiple Informants  aspirin  EC 81 MG tablet [id number] Yes Take 1 tablet (81 mg total) by mouth daily at 6 (six) AM. Swallow whole. Elna Ahmed SQUIBB, PA-C  Active Self, Pharmacy Records, Multiple Informants  atorvastatin  (LIPITOR) 40 MG tablet [id number] Yes Take 1 tablet by mouth once daily Medina-Vargas, Monina C, NP  Active   clopidogrel  (PLAVIX ) 75 MG tablet [id number] Yes Take 1 tablet by mouth once daily  Medina-Vargas, Monina C, NP  Active   Continuous Glucose Sensor (FREESTYLE LIBRE 2 SENSOR) MISC [id number] Yes USE AS DIRECTED Medina-Vargas, Monina C, NP  Active   Ferrous Sulfate  (IRON ) 325 (65 Fe) MG TABS [id number] Yes Take 1 tablet by mouth once daily with breakfast Cloretta Arley NOVAK, MD  Active   gabapentin  (NEURONTIN ) 100 MG capsule [id number] Yes Take 1 capsule (100 mg total) by mouth 3 (three) times daily. Ngetich, Roxan BROCKS, NP  Active Self, Pharmacy Records, Multiple Informants  hydrochlorothiazide  (HYDRODIURIL ) 25 MG tablet [id number] Yes Take 1/2 (one-half) tablet by mouth once daily Medina-Vargas, Monina C, NP  Active   HYDROcodone -acetaminophen  (NORCO/VICODIN) 5-325 MG tablet [id number] Yes Take 1 tablet by mouth every 6 (six) hours as needed for severe pain (pain score 7-10). Christine Randy, DPM  Active   losartan  (COZAAR ) 50 MG tablet [id number] Yes TAKE 1 TABLET BY MOUTH IN THE MORNING AND AT BEDTIME Medina-Vargas, Monina C, NP  Active   Melatonin 10 MG TABS [id number] Yes Take 10 mg by mouth at bedtime. [provider]  Active Self, Pharmacy Records, Multiple Informants  metFORMIN  (GLUCOPHAGE ) 1000 MG tablet [id number] Yes TAKE 1 TABLET BY MOUTH TWICE DAILY WITH A MEAL Medina-Vargas, Monina C, NP  Active   pantoprazole  (PROTONIX ) 40 MG tablet [id number] Yes Take 1 tablet by mouth once daily Medina-Vargas, Monina C, NP  Active    Patient not taking:   Discontinued 03/16/24 1038 (Completed Course) sitaGLIPtin  (JANUVIA ) 50 MG tablet [id number] Yes Take 1 tablet (50 mg total)  by mouth daily as needed (for high blood sugar). Medina-Vargas, Monina C, NP  Active   TRESIBA  FLEXTOUCH 100 UNIT/ML FlexTouch Pen [id number] Yes Inject 38 Units into the skin daily. Medina-Vargas, Monina C, NP  Active Self, Pharmacy Records, Multiple Informants            Lab Results  Component Value Date   HGBA1C 5.9 (H) 10/28/2023   HGBA1C 6.2 (H) 03/18/2023   HGBA1C 6.5 (H) 10/11/2022    Plan: Message sent  to Luke Mall, CPhT about getting an application ready for Randy Jacobson for Januvia . She was also asked to check on the renewal of Tresiba  for 2026.    [name], PharmD, BCACP Clinical Pharmacist [phone]

## 2024-03-16 NOTE — Telephone Encounter (Signed)
 PAP: Patient assistance application for Januvia through [employer] has been mailed to pt's home address on file. Provider portion of application will be faxed to provider's office.

## 2024-03-20 NOTE — Telephone Encounter (Signed)
 Received provider portion PAP application for Januvia  [employer])

## 2024-03-20 NOTE — Telephone Encounter (Signed)
 PAP: Application for Randy Jacobson has been submitted to [employer], via fax

## 2024-03-26 NOTE — Telephone Encounter (Signed)
 PAP: Patient assistance application for Tresiba  has been approved by PAP Companies: [employer] from 04/12/2024 to 04/11/2025. Medication should be delivered to PAP Delivery: Provider's office. For further shipping updates, please contact [employer] at [phone]. Patient ID is: [id number]

## 2024-04-02 ENCOUNTER — Other Ambulatory Visit (HOSPITAL_COMMUNITY): Payer: Self-pay

## 2024-04-03 NOTE — Telephone Encounter (Signed)
 PAP: Application for Januvia  has been submitted to [employer], via fax patient did not provide POI.

## 2024-04-11 ENCOUNTER — Ambulatory Visit: Payer: Self-pay

## 2024-04-11 NOTE — Telephone Encounter (Signed)
 Patient is advised that the recommendation is for him to be seen and evaluated today. No available appointments in the PCP office today. Patient did not want to go to Urgent Care at this time.  Called [facility] and spoke with Donzell about patient's symptoms.  Patient is warm transferred to [facility] for further assistance at this time. Patient is also advised that if anything gets worse to go to the Emergency Room. He verbalized understanding.   FYI Only or Action Required?: FYI only for provider: Patient transferred to [facility] at [facility] for further assistance--he did not want to go to Urgent Care and there were no openings in PCP office or any offices within the surrounding region..  Patient was last seen in primary care on 01/05/2024 by Medina-Vargas, Jereld BROCKS, NP.  Called Nurse Triage reporting Shoulder Injury.  Symptoms began yesterday.  Interventions attempted: OTC medications: Tylenol  and Rest, hydration, or home remedies.  Symptoms are: gradually worsening.  Triage Disposition: See HCP Within 4 Hours (Or PCP Triage)  Patient/caregiver understands and will follow disposition?: No, wishes to speak with PCP      Message from Debby BROCKS sent at 04/11/2024 10:12 AM EST  Reason for Triage: Patient states that he fell yesterday on his arm and is currently in pain. Would like to see Amy fargo or someone in [facility]   Reason for Disposition  [1] Large swelling or bruise (> 2 inches or 5 cm) AND [2] can't move injured shoulder normally (e.g., able to touch top of head, good range of motion)  Answer Assessment - Initial Assessment Questions Yesterday patient states he fell and he was able to catch himself but he did fall on his shoulder He states he can hardly move his left shoulder and it swollen/bruised. Patient denies loss of consciousness and denies hitting his head, bleeding, open wounds. Patient inquired about pain medication as well due to over the counter Tylenol  not helping. 9 out of 10 on pain  scale.  Patient is advised that the recommendation is for him to be seen and evaluated today. No available appointments in the PCP office today. Patient did not want to go to Urgent Care at this time.  Called [facility] and spoke with Donzell about patient's symptoms.  Patient is warm transferred to [facility] for further assistance at this time. Patient is also advised that if anything gets worse to go to the Emergency Room. He verbalized understanding.  Protocols used: Shoulder Injury-A-AH

## 2024-04-11 NOTE — Telephone Encounter (Signed)
 I agree with recommendation to get evaluated in urgent care or ED.

## 2024-04-11 NOTE — Telephone Encounter (Signed)
 Spoke with patient and he stated that he fell and now could hardly move his Left shoulder. Stated that he was having severe pain and it was swelling.   No appointments in office. Advised patient to go to Urgent Care for evaluation. Patient agreed.

## 2024-04-23 ENCOUNTER — Other Ambulatory Visit (HOSPITAL_BASED_OUTPATIENT_CLINIC_OR_DEPARTMENT_OTHER): Payer: Self-pay

## 2024-04-25 ENCOUNTER — Ambulatory Visit: Payer: Self-pay

## 2024-04-25 NOTE — Telephone Encounter (Signed)
 Wants TOC from [facility] to [facility]. Pt disgruntled with recent lack of appointment availability after a recent fall and that he was unable to receive a prescription for pain relief.    FYI Only or Action Required?: FYI only for provider: TOC appt scheduled.  Patient was last seen in primary care on 01/05/2024 by Medina-Vargas, Jereld BROCKS, NP.  Called Nurse Triage reporting Fall.  Symptoms began several weeks ago.  Interventions attempted: Other: pills not from a pharmacy.  Symptoms are: completely resolved.  Triage Disposition: Home Care  Patient/caregiver understands and will follow disposition?: Yes  Copied from CRM [id number]. Topic: Clinical - Red Word Triage >> Apr 25, 2024  9:04 AM Joesph NOVAK wrote: Red Word that prompted transfer to Nurse Triage: Shortness of breath, fell on new years. Reason for Disposition  [1] Recent fall AND [2] no injury  Answer Assessment - Initial Assessment Questions 1. MECHANISM: How did the fall happen?     I tripped over a root and fell.  2. DOMESTIC VIOLENCE AND ELDER ABUSE SCREENING: Did you fall because someone pushed you or tried to hurt you? If Yes, ask: Are you safe now?     Denies  3. ONSET: When did the fall happen? (e.g., minutes, hours, or days ago)     2 days before New Years  4. LOCATION: What part of the body hit the ground? (e.g., back, buttocks, head, hips, knees, hands, head, stomach)     Whole front of my chest, from there down to my navel, was black. My arms on both sides was black.   5. INJURY: Did you hurt (injure) yourself when you fell? If Yes, ask: What did you injure? Tell me more about this? (e.g., body area; type of injury; pain severity)     I don't know if I did or not, tell you the truth. But I'm not hurting anywhere.   6. PAIN: Is there any pain? If Yes, ask: How bad is the pain? (e.g., Scale 0-10; or none, mild,      Denies pain at this time, reports sore right sided ribcage pain 1.5 wk ago post  fall  7. SIZE: For cuts, bruises, or swelling, ask: How large is it? (e.g., inches or centimeters)      A little bit residual bruising  10. CAUSE: What do you think caused the fall (or falling)? (e.g., dizzy spell, tripped)       Tripped on a root  Protocols used: Falls and [facility]

## 2024-05-03 ENCOUNTER — Telehealth: Payer: Self-pay | Admitting: Pharmacist

## 2024-05-03 DIAGNOSIS — E1101 Type 2 diabetes mellitus with hyperosmolarity with coma: Secondary | ICD-10-CM

## 2024-05-03 NOTE — Progress Notes (Signed)
" ° °  05/03/2024 Name: Randy Jacobson MRN: [id number] DOB: 1945/12/31  Chief Complaint  Patient presents with   Medication Assistance    Januvia     Patient called inquiring about patient assistance with Janumet. HIPAA identifiers were obtained.  Patient said he had been receiving letters and was confused about what is going on.   He wondered if any paperwork had been completed on his behalf.  He was let know we faxed an application for him in December for Tresiba  which he was approved for.  There is also documentation that the [employer] application did not have proof of income.  [employer] Patient Assistance program was called.  The representative said the Patient just needed to include the attestation form that they sent Him and he would be officially approved.  Patient shared that his PCP has recently changed.  He was encouraged to reach out to that office about his Patient Assistance at his next appointment.  I will follow up with the Patient in 2 weeks and reach out to the Pharmacist associated with his clinic prior to his upcoming appointment about transferring delivery of his Tresiba  to their office.   [name], PharmD, BCACP Clinical Pharmacist [phone]                             "

## 2024-05-03 NOTE — Progress Notes (Deleted)
" ° °  05/03/2024 Name: Randy Jacobson MRN: [id number] DOB: 01/04/1946  No chief complaint on file.   {Visit Type:26650}   Subjective:  Care Team: Primary Care Provider: Phyllis Jereld BROCKS, NP ; Next Scheduled Visit: *** {careteamprovider:27366}  Medication Access/Adherence  Current Pharmacy:  [facility] [address] [place] - [address] [place] [address] Phone: [phone] Fax: [phone]  [facility] [address] [place] [address] Phone: [phone] Fax: [phone]   Patient reports affordability concerns with their medications: {YES/NO:21197} Patient reports access/transportation concerns to their pharmacy: {YES/NO:21197} Patient reports adherence concerns with their medications:  {YES/NO:21197} ***   {Pharmacy S/O Choices:26420}   Objective:  Lab Results  Component Value Date   HGBA1C 5.9 (H) 10/28/2023    Lab Results  Component Value Date   CREATININE 0.77 11/24/2023   BUN 17 11/24/2023   NA 140 11/24/2023   K 4.4 11/24/2023   CL 105 11/24/2023   CO2 22 11/24/2023    Lab Results  Component Value Date   CHOL 70 06/24/2023   HDL 24 (L) 06/24/2023   LDLCALC 31 06/24/2023   TRIG 74 06/24/2023   CHOLHDL 2.9 06/24/2023    Medications Reviewed Today   Medications were not reviewed in this encounter       Assessment/Plan:   {Pharmacy A/P Choices:26421}  Follow Up Plan: ***  ***   "

## 2024-05-15 ENCOUNTER — Other Ambulatory Visit: Payer: Self-pay | Admitting: Adult Health

## 2024-05-18 ENCOUNTER — Encounter: Payer: Self-pay | Admitting: Adult Health

## 2024-05-18 ENCOUNTER — Ambulatory Visit: Admitting: Adult Health

## 2024-05-18 VITALS — BP 126/56 | HR 93 | Temp 98.0°F | Ht 70.0 in | Wt 189.4 lb

## 2024-05-18 DIAGNOSIS — Z23 Encounter for immunization: Secondary | ICD-10-CM

## 2024-05-18 DIAGNOSIS — I739 Peripheral vascular disease, unspecified: Secondary | ICD-10-CM

## 2024-05-18 DIAGNOSIS — E785 Hyperlipidemia, unspecified: Secondary | ICD-10-CM

## 2024-05-18 DIAGNOSIS — I10 Essential (primary) hypertension: Secondary | ICD-10-CM

## 2024-05-18 DIAGNOSIS — Z1283 Encounter for screening for malignant neoplasm of skin: Secondary | ICD-10-CM

## 2024-05-18 DIAGNOSIS — E1169 Type 2 diabetes mellitus with other specified complication: Secondary | ICD-10-CM

## 2024-05-18 DIAGNOSIS — D500 Iron deficiency anemia secondary to blood loss (chronic): Secondary | ICD-10-CM

## 2024-05-18 LAB — CBC WITH DIFFERENTIAL/PLATELET
Absolute Lymphocytes: 950 {cells}/uL (ref 850–3900)
Absolute Monocytes: 1241 {cells}/uL — ABNORMAL HIGH (ref 200–950)
Basophils Absolute: 62 {cells}/uL (ref 0–200)
Basophils Relative: 0.7 %
Eosinophils Absolute: 229 {cells}/uL (ref 15–500)
Eosinophils Relative: 2.6 %
HCT: 31.9 % — ABNORMAL LOW (ref 39.4–51.1)
Hemoglobin: 10.4 g/dL — ABNORMAL LOW (ref 13.2–17.1)
MCH: 30.6 pg (ref 27.0–33.0)
MCHC: 32.6 g/dL (ref 31.6–35.4)
MCV: 93.8 fL (ref 81.4–101.7)
MPV: 10.9 fL (ref 7.5–12.5)
Monocytes Relative: 14.1 %
Neutro Abs: 6318 {cells}/uL (ref 1500–7800)
Neutrophils Relative %: 71.8 %
Platelets: 279 10*3/uL (ref 140–400)
RBC: 3.4 Million/uL — ABNORMAL LOW (ref 4.20–5.80)
RDW: 15.5 % — ABNORMAL HIGH (ref 11.0–15.0)
Total Lymphocyte: 10.8 %
WBC: 8.8 10*3/uL (ref 3.8–10.8)

## 2024-05-18 LAB — LIPID PANEL
Cholesterol: 113 mg/dL
HDL: 36 mg/dL — ABNORMAL LOW
LDL Cholesterol (Calc): 58 mg/dL
Non-HDL Cholesterol (Calc): 77 mg/dL
Total CHOL/HDL Ratio: 3.1 (calc)
Triglycerides: 111 mg/dL

## 2024-05-18 LAB — BASIC METABOLIC PANEL WITH GFR
BUN: 19 mg/dL (ref 7–25)
CO2: 30 mmol/L (ref 20–32)
Calcium: 9.2 mg/dL (ref 8.6–10.3)
Chloride: 101 mmol/L (ref 98–110)
Creat: 0.81 mg/dL (ref 0.70–1.22)
Glucose, Bld: 123 mg/dL — ABNORMAL HIGH (ref 65–99)
Potassium: 4.7 mmol/L (ref 3.5–5.3)
Sodium: 139 mmol/L (ref 135–146)
eGFR: 89 mL/min/{1.73_m2}

## 2024-05-18 NOTE — Progress Notes (Signed)
 "  [facility] clinic  Provider:  Jereld Serum DNP  Code Status:  Full code  Goals of Care:     01/05/2024    1:38 PM  Advanced Directives  Does Patient Have a Medical Advance Directive? No  Type of Advance Directive Living will;Healthcare Power of Attorney  Does patient want to make changes to medical advance directive? No - Patient declined  Would patient like information on creating a medical advance directive? No - Patient declined     Chief Complaint  Patient presents with   Referral    Dermatologist. He states that he feels like something is eating him from the outside in     Discussed the use of AI scribe software for clinical note transcription with the patient, who gave verbal consent to proceed.   HPI: Patient is a 79 y.o. male seen today for a three-month follow-up.  He reports taking aspirin , Plavix , and a statin for peripheral artery disease. He reports no pain or dizziness and denies chest pain or recent hospital visits. He reports that he is following up with vascular surgery and expects to return in six months.  His diabetes is managed with Tresiba  38 units, metformin  1000 mg twice daily, and Januvia  50 mg as needed for elevated blood sugar. His home blood sugar reading is 203, and his last A1c was 5.9.  For hypertension, he takes amlodipine  10 mg daily, losartan  50 mg twice daily, and hydrochlorothiazide  25 mg half tablet daily. His blood pressure today is 126/56.  He has a history of iron  deficiency anemia and takes iron  sulfate 325 mg daily. His last hemoglobin was 10.1 four months ago.  He reports good energy levels and sleeps eight hours nightly, aided by melatonin 10 mg at bedtime. 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  He takes atorvastatin  40 mg daily for hyperlipidemia, with the last lipid panel done on June 24, 2023.  He experiences arm pain and takes Norco (hydrocodone ) one tablet every six hours as  needed. 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  He has no current skin issues but requests a referral for a routine dermatology check-up, which he does every two years. No history of skin cancer. He stated that he feels like there is something eating him from outside in.   Past Medical History:  Diagnosis Date   Diabetes mellitus without complication (HCC)    Dysrhythmia    chronic RBBB; bradycardia s/p atropine  following sheath removal, 3.8 second sinus pause 10/11/22   Hypertension    Peripheral arterial disease    Peripheral artery disease     Past Surgical History:  Procedure Laterality Date   stents in both my legs     ABDOMINAL AORTOGRAM W/LOWER EXTREMITY N/A 10/11/2022   Procedure: ABDOMINAL AORTOGRAM W/LOWER EXTREMITY;  Surgeon: Eliza Lonni RAMAN, MD;  Location: [facility] INVASIVE CV LAB;  Service: Cardiovascular;  Laterality: N/A;   ABDOMINAL AORTOGRAM W/LOWER EXTREMITY Left 06/20/2023   Procedure: ABDOMINAL AORTOGRAM W/LOWER EXTREMITY;  Surgeon: Pearline Norman RAMAN, MD;  Location: [facility] INVASIVE CV LAB;  Service: Cardiovascular;  Laterality: Left;   ABDOMINAL AORTOGRAM W/LOWER EXTREMITY N/A 10/17/2023   Procedure: ABDOMINAL AORTOGRAM W/LOWER EXTREMITY;  Surgeon: Pearline Norman RAMAN, MD;  Location: [facility] INVASIVE CV LAB;  Service: Cardiovascular;  Laterality: N/A;   AMPUTATION Right 10/29/2023   Procedure: AMPUTATION, FOOT, RAY;  Surgeon: Janit Thresa HERO, DPM;  Location:  [facility] OR;  Service: Orthopedics/Podiatry;  Laterality: Right;  PARTIAL FIFTH RAY AMPUTATION   COLONOSCOPY WITH PROPOFOL  N/A 01/18/2023   Procedure: COLONOSCOPY WITH PROPOFOL ;  Surgeon: Saintclair Jasper, MD;  Location: [facility] ENDOSCOPY;  Service: Gastroenterology;  Laterality: N/A;   ENDARTERECTOMY FEMORAL Left 10/15/2022   Procedure: ENDARTERECTOMY OF LEFT ILIOFEMORAL ARTERY;  Surgeon: Eliza Lonni RAMAN, MD;  Location: [facility] OR;  Service: Vascular;  Laterality: Left;   ENDARTERECTOMY  FEMORAL Right 06/23/2023   Procedure: COMMON FEMORAL ENDARTERECTOMY;  Surgeon: Pearline Norman RAMAN, MD;  [facility];  Service: Vascular;  Laterality: Right;   ESOPHAGOGASTRODUODENOSCOPY (EGD) WITH PROPOFOL  N/A 01/18/2023   Procedure: ESOPHAGOGASTRODUODENOSCOPY (EGD) WITH PROPOFOL ;  Surgeon: Saintclair Jasper, MD;  Location: [facility] ENDOSCOPY;  Service: Gastroenterology;  Laterality: N/A;   FEMORAL-POPLITEAL BYPASS GRAFT Left 10/15/2022   Procedure: LEFT FEMORAL-BELOW KNEE POPLITEAL ARTERY BYPASS WITH VEIN HARVEST OF LEFT GREATER SAPHENOUS VEIN;  Surgeon: Eliza Lonni RAMAN, MD;  Location: [facility] OR;  Service: Vascular;  Laterality: Left;   GIVENS CAPSULE STUDY N/A 04/10/2017   Procedure: GIVENS CAPSULE STUDY;  Surgeon: Dianna Specking, MD;  Location: [facility] ENDOSCOPY;  Service: Endoscopy;  Laterality: N/A;   GIVENS CAPSULE STUDY N/A 01/18/2023   Procedure: GIVENS CAPSULE STUDY;  Surgeon: Saintclair Jasper, MD;  Location: [facility] ENDOSCOPY;  Service: Gastroenterology;  Laterality: N/A;   INSERTION OF ILIAC STENT Right 06/23/2023   Procedure: INSERTION, STENT, ARTERY, ILIAC;  Surgeon: Pearline Norman RAMAN, MD;  Location: [facility] OR;  Service: Vascular;  Laterality: Right;   IR GENERIC HISTORICAL  08/26/2015   IR RADIOLOGIST EVAL & MGMT 08/26/2015 Wilkie Lent, MD [facility] INTERV RAD   IR GENERIC HISTORICAL  04/21/2016   IR RADIOLOGIST EVAL & MGMT 04/21/2016 Wilkie Lent, MD [facility] INTERV RAD   LOWER EXTREMITY ANGIOGRAM Right 06/23/2023   Procedure: GERALYN, LOWER EXTREMITY;  Surgeon: Pearline Norman RAMAN, MD;  Location: [facility] OR;  Service: Vascular;  Laterality: Right;   LOWER EXTREMITY INTERVENTION Left 06/20/2023   Procedure: LOWER EXTREMITY INTERVENTION;  Surgeon: Pearline Norman RAMAN, MD;  Location: [facility] INVASIVE CV LAB;  Service: Cardiovascular;  Laterality: Left;  DCB   LOWER EXTREMITY INTERVENTION N/A 10/17/2023   Procedure: LOWER EXTREMITY INTERVENTION;  Surgeon: Pearline Norman RAMAN, MD;  Location: [facility] INVASIVE CV LAB;  Service:  Cardiovascular;  Laterality: N/A;   PATCH ANGIOPLASTY Left 10/15/2022   Procedure: PATCH ANGIOPLASTY USING XENOSURE BIOLOGIC PATCH 1CMX14CM;  Surgeon: Eliza Lonni RAMAN, MD;  Location: [facility] OR;  Service: Vascular;  Laterality: Left;   PATCH ANGIOPLASTY Right 06/23/2023   Procedure: ANGIOPLASTY, USING PATCH GRAFT 6CM GEORGE;  Surgeon: Pearline Norman RAMAN, MD;  Location: [facility] OR;  Service: Vascular;  Laterality: Right;   PERIPHERAL VASCULAR INTERVENTION  10/11/2022   Procedure: PERIPHERAL VASCULAR INTERVENTION;  Surgeon: Eliza Lonni RAMAN, MD;  Location: [facility] INVASIVE CV LAB;  Service: Cardiovascular;;    Allergies[1]  Outpatient Encounter Medications as of 05/18/2024  Medication Sig   acetaminophen  (TYLENOL ) 500 MG tablet Take 1 tablet (500 mg total) by mouth every 8 (eight) hours as needed for mild pain (pain score 1-3) or fever.   albuterol  (VENTOLIN  HFA) 108 (90 Base) MCG/ACT inhaler Inhale 2 puffs into the lungs every 6 (six) hours as needed for wheezing or shortness of breath.   amLODipine  (NORVASC ) 10 MG tablet Take 1 tablet by mouth once daily   Ascorbic Acid (VITAMIN C ) 1000 MG tablet Take 1 tablet (1,000 mg total) by mouth daily.   aspirin  EC 81 MG tablet Take 1 tablet (81 mg total)  by mouth daily at 6 (six) AM. Swallow whole.   atorvastatin  (LIPITOR) 40 MG tablet Take 1 tablet by mouth once daily   clopidogrel  (PLAVIX ) 75 MG tablet Take 1 tablet by mouth once daily   Continuous Glucose Sensor (FREESTYLE LIBRE 2 SENSOR) MISC USE AS DIRECTED   Ferrous Sulfate  (IRON ) 325 (65 Fe) MG TABS Take 1 tablet by mouth once daily with breakfast   hydrochlorothiazide  (HYDRODIURIL ) 25 MG tablet Take 1/2 (one-half) tablet by mouth once daily   losartan  (COZAAR ) 50 MG tablet TAKE 1 TABLET BY MOUTH IN THE MORNING AND AT BEDTIME   Melatonin 10 MG TABS Take 10 mg by mouth at bedtime.   metFORMIN  (GLUCOPHAGE ) 1000 MG tablet TAKE 1 TABLET BY MOUTH TWICE DAILY WITH A MEAL   pantoprazole  (PROTONIX ) 40 MG  tablet Take 1 tablet by mouth once daily   sitaGLIPtin  (JANUVIA ) 50 MG tablet Take 1 tablet (50 mg total) by mouth daily as needed (for high blood sugar).   TRESIBA  FLEXTOUCH 100 UNIT/ML FlexTouch Pen Inject 38 Units into the skin daily.   [DISCONTINUED] gabapentin  (NEURONTIN ) 100 MG capsule Take 1 capsule (100 mg total) by mouth 3 (three) times daily.   HYDROcodone -acetaminophen  (NORCO/VICODIN) 5-325 MG tablet Take 1 tablet by mouth every 6 (six) hours as needed for severe pain (pain score 7-10). (Patient not taking: Reported on 05/18/2024)   No facility-administered encounter medications on file as of 05/18/2024.    Review of Systems:  Review of Systems  Constitutional:  Negative for activity change, appetite change and fever.  HENT:  Negative for sore throat.   Eyes: Negative.   Cardiovascular:  Negative for chest pain and leg swelling.  Gastrointestinal:  Negative for abdominal distention, diarrhea and vomiting.  Genitourinary:  Negative for dysuria, frequency and urgency.  Skin:  Negative for color change.  Neurological:  Negative for dizziness and headaches.  Psychiatric/Behavioral:  Negative for behavioral problems and sleep disturbance. The patient is not nervous/anxious.     Health Maintenance  Topic Date Due   OPHTHALMOLOGY EXAM  05/21/2017   COVID-19 Vaccine (4 - 2025-26 season) 12/12/2023   Diabetic kidney evaluation - Urine ACR  05/16/2024   HEMOGLOBIN A1C  04/29/2024   Diabetic kidney evaluation - eGFR measurement  11/23/2024   Medicare Annual Wellness (AWV)  01/04/2025   FOOT EXAM  05/18/2025   DTaP/Tdap/Td (4 - Td or Tdap) 12/23/2031   Pneumococcal Vaccine: 50+ Years  Completed   Influenza Vaccine  Completed   Zoster Vaccines- Shingrix  Completed   Meningococcal B Vaccine  Aged Out   Colonoscopy  Discontinued   Hepatitis C Screening  Discontinued    Physical Exam: Vitals:   05/18/24 0955  BP: (!) 126/56  Pulse: 93  Temp: 98 F (36.7 C)  TempSrc: Temporal   SpO2: 93%  Weight: 189 lb 6.4 oz (85.9 kg)  Height: 5' 10 (1.778 m)   Body mass index is 27.18 kg/m. Physical Exam Constitutional:      Appearance: Normal appearance.  HENT:     Head: Normocephalic and atraumatic.     Mouth/Throat:     Mouth: Mucous membranes are moist.  Eyes:     Conjunctiva/sclera: Conjunctivae normal.  Cardiovascular:     Rate and Rhythm: Normal rate and regular rhythm.     Pulses: Normal pulses.     Heart sounds: Normal heart sounds.  Pulmonary:     Effort: Pulmonary effort is normal.     Breath sounds: Normal breath sounds.  Abdominal:  General: Bowel sounds are normal.     Palpations: Abdomen is soft.  Musculoskeletal:        General: No swelling. Normal range of motion.     Cervical back: Normal range of motion.  Skin:    General: Skin is warm and dry.  Neurological:     General: No focal deficit present.     Mental Status: He is alert and oriented to person, place, and time.  Psychiatric:        Mood and Affect: Mood normal.        Behavior: Behavior normal.        Thought Content: Thought content normal.        Judgment: Judgment normal.     Labs reviewed: Basic Metabolic Panel: Recent Labs    10/30/23 0703 11/14/23 1034 11/24/23 1122  NA 137 139 140  K 4.1 4.5 4.4  CL 100 99 105  CO2 26 27 22   GLUCOSE 160* 71 137*  BUN 12 18 17   CREATININE 0.79 0.83 0.77  CALCIUM  8.9 8.7 8.7*  MG 1.7  --   --    Liver Function Tests: Recent Labs    10/06/23 1150 10/28/23 0947 11/14/23 1034 11/24/23 1122  AST 23 16 19 19   ALT 15 12 11 12   ALKPHOS 87 82  --  86  BILITOT 0.4 0.7 0.6 0.4  PROT 7.6 7.3 6.7 7.0  ALBUMIN  4.6 3.6  --  4.3   Recent Labs    11/24/23 1122  LIPASE 30   No results for input(s): AMMONIA in the last 8760 hours. CBC: Recent Labs    11/24/23 1122 12/01/23 1307 01/05/24 1324  WBC 14.1* 8.9 10.3  NEUTROABS 11.4* 6.2 7.5  HGB 9.9* 10.1* 10.1*  HCT 30.7* 30.6* 30.4*  MCV 97.2 94.7 98.4  PLT 254 243  253   Lipid Panel: Recent Labs    06/24/23 0345  CHOL 70  HDL 24*  LDLCALC 31  TRIG 74  CHOLHDL 2.9   Lab Results  Component Value Date   HGBA1C 5.9 (H) 10/28/2023    Procedures since last visit: No results found.  Assessment/Plan  1. Type 2 diabetes mellitus with other specified complication, unspecified whether long term insulin  use (HCC) (Primary) -  Well-controlled with current medications. Last A1c was 5.9% in July 2018, indicating good glycemic control. - Ordered A1c test to monitor glycemic control. - Continue Tresiba , metformin , and Januvia  as needed. - recommended to follow up with ophthalmology for diabetic eye exam. - Ordered urine microalbumin test. - Hemoglobin A1c - Microalbumin/Creatinine Ratio, Urine  2. PAD (peripheral artery disease) -  Well-managed with no current symptoms. On aspirin , Plavix , and atorvastatin  for secondary prevention. - Continue aspirin , Plavix , and atorvastatin . - Follow up with vascular surgery in six months.  3. Iron  deficiency anemia due to chronic blood loss -  Managed with iron  sulfate supplementation. Last hemoglobin was 10.1 g/dL four months ago. - Ordered repeat CBC to monitor hemoglobin levels. - Continue iron  sulfate 325 mg daily. -  feels like there is something eating him from outside in - Ambulatory referral to Dermatology - CBC with Differential/Platelets  4. Primary hypertension -  Well-controlled with current medication regimen. Blood pressure today is 126/56 mmHg. - Continue amlodipine , hydrochlorothiazide  and losartan . - Basic Metabolic Panel  5. Hyperlipidemia, unspecified hyperlipidemia type -  Managed with atorvastatin . Last lipid panel in March 2025 was satisfactory. - Continue atorvastatin  40 mg daily. - Lipid Panel  6. Screening for skin  cancer -  feels like there is something eating him from outside in - Ambulatory referral to Dermatology  7. Need for pneumococcal 20-valent conjugate  vaccination - Pneumococcal conjugate vaccine 20-valent (Prevnar 20)      Labs/tests ordered:   BMP, CBC, A1C, lipid panel, urine microalbumin creatinine ratio   Return if symptoms worsen or fail to improve.  Randy Scott Medina-Vargas, NP     [1] No Known Allergies  "

## 2024-06-06 ENCOUNTER — Encounter (HOSPITAL_BASED_OUTPATIENT_CLINIC_OR_DEPARTMENT_OTHER): Admitting: Family Medicine

## 2024-07-06 ENCOUNTER — Encounter (HOSPITAL_COMMUNITY)

## 2024-07-06 ENCOUNTER — Ambulatory Visit

## 2025-01-11 ENCOUNTER — Ambulatory Visit: Payer: Self-pay | Admitting: Adult Health
